# Patient Record
Sex: Male | Born: 1945 | Race: White | Hispanic: No | Marital: Married | Smoking: Former smoker
Health system: Southern US, Academic
[De-identification: ages and names within clinical notes are randomized; demographics above are authoritative.]

## PROBLEM LIST (undated history)

## (undated) DIAGNOSIS — F419 Anxiety disorder, unspecified: Secondary | ICD-10-CM

## (undated) DIAGNOSIS — R7301 Impaired fasting glucose: Secondary | ICD-10-CM

## (undated) DIAGNOSIS — J302 Other seasonal allergic rhinitis: Secondary | ICD-10-CM

## (undated) DIAGNOSIS — R7303 Prediabetes: Secondary | ICD-10-CM

## (undated) DIAGNOSIS — R011 Cardiac murmur, unspecified: Secondary | ICD-10-CM

## (undated) DIAGNOSIS — H269 Unspecified cataract: Secondary | ICD-10-CM

## (undated) DIAGNOSIS — R972 Elevated prostate specific antigen [PSA]: Secondary | ICD-10-CM

## (undated) DIAGNOSIS — K5790 Diverticulosis of intestine, part unspecified, without perforation or abscess without bleeding: Secondary | ICD-10-CM

## (undated) DIAGNOSIS — I10 Essential (primary) hypertension: Secondary | ICD-10-CM

## (undated) DIAGNOSIS — I00 Rheumatic fever without heart involvement: Secondary | ICD-10-CM

## (undated) DIAGNOSIS — I440 Atrioventricular block, first degree: Secondary | ICD-10-CM

## (undated) DIAGNOSIS — IMO0002 Reserved for concepts with insufficient information to code with codable children: Secondary | ICD-10-CM

## (undated) DIAGNOSIS — E785 Hyperlipidemia, unspecified: Secondary | ICD-10-CM

## (undated) DIAGNOSIS — E291 Testicular hypofunction: Secondary | ICD-10-CM

## (undated) DIAGNOSIS — C4491 Basal cell carcinoma of skin, unspecified: Secondary | ICD-10-CM

## (undated) DIAGNOSIS — I4510 Unspecified right bundle-branch block: Secondary | ICD-10-CM

## (undated) DIAGNOSIS — R0600 Dyspnea, unspecified: Secondary | ICD-10-CM

## (undated) DIAGNOSIS — R739 Hyperglycemia, unspecified: Secondary | ICD-10-CM

## (undated) DIAGNOSIS — N400 Enlarged prostate without lower urinary tract symptoms: Secondary | ICD-10-CM

## (undated) DIAGNOSIS — K219 Gastro-esophageal reflux disease without esophagitis: Secondary | ICD-10-CM

## (undated) DIAGNOSIS — M25519 Pain in unspecified shoulder: Secondary | ICD-10-CM

## (undated) DIAGNOSIS — Z8701 Personal history of pneumonia (recurrent): Secondary | ICD-10-CM

## (undated) HISTORY — DX: Benign prostatic hyperplasia without lower urinary tract symptoms: N40.0

## (undated) HISTORY — DX: Reserved for concepts with insufficient information to code with codable children: IMO0002

## (undated) HISTORY — DX: Pain in unspecified shoulder: M25.519

## (undated) HISTORY — DX: Gastro-esophageal reflux disease without esophagitis: K21.9

## (undated) HISTORY — DX: Hyperlipidemia, unspecified: E78.5

## (undated) HISTORY — DX: Dyspnea, unspecified: R06.00

## (undated) HISTORY — DX: Essential (primary) hypertension: I10

## (undated) HISTORY — DX: Personal history of pneumonia (recurrent): Z87.01

## (undated) HISTORY — DX: Anxiety disorder, unspecified: F41.9

## (undated) HISTORY — DX: Testicular hypofunction: E29.1

## (undated) HISTORY — DX: Hyperglycemia, unspecified: R73.9

## (undated) HISTORY — PX: NASAL SEPTUM SURGERY: SHX37

## (undated) HISTORY — DX: Rheumatic fever without heart involvement: I00

## (undated) HISTORY — PX: WISDOM TOOTH EXTRACTION: SHX21

## (undated) HISTORY — DX: Other seasonal allergic rhinitis: J30.2

## (undated) HISTORY — DX: Unspecified right bundle-branch block: I45.10

## (undated) HISTORY — PX: TONSILLECTOMY: SUR1361

## (undated) HISTORY — DX: Diverticulosis of intestine, part unspecified, without perforation or abscess without bleeding: K57.90

## (undated) HISTORY — DX: Cardiac murmur, unspecified: R01.1

## (undated) HISTORY — PX: EYE SURGERY: SHX253

## (undated) HISTORY — DX: Basal cell carcinoma of skin, unspecified: C44.91

## (undated) HISTORY — DX: Atrioventricular block, first degree: I44.0

## (undated) HISTORY — DX: Elevated prostate specific antigen (PSA): R97.20

## (undated) HISTORY — PX: SKIN CANCER EXCISION: SHX779

## (undated) HISTORY — DX: Impaired fasting glucose: R73.01

## (undated) HISTORY — PX: NOSE SURGERY: SHX723

## (undated) HISTORY — DX: Unspecified cataract: H26.9

## (undated) HISTORY — PX: VASECTOMY: SHX75

---

## 2008-04-16 ENCOUNTER — Ambulatory Visit: Payer: Self-pay

## 2009-11-02 HISTORY — PX: HX COLONOSCOPY: 2100001147

## 2010-02-13 ENCOUNTER — Ambulatory Visit (HOSPITAL_COMMUNITY): Payer: Self-pay | Admitting: Plastic and Reconstructive Surgery

## 2010-11-17 ENCOUNTER — Encounter (INDEPENDENT_AMBULATORY_CARE_PROVIDER_SITE_OTHER): Payer: Self-pay

## 2010-11-19 ENCOUNTER — Encounter (INDEPENDENT_AMBULATORY_CARE_PROVIDER_SITE_OTHER): Payer: Medicare Other

## 2011-03-03 ENCOUNTER — Encounter (INDEPENDENT_AMBULATORY_CARE_PROVIDER_SITE_OTHER): Payer: Medicare Other

## 2011-03-25 ENCOUNTER — Encounter (INDEPENDENT_AMBULATORY_CARE_PROVIDER_SITE_OTHER): Payer: Medicare Other

## 2011-04-06 ENCOUNTER — Encounter (INDEPENDENT_AMBULATORY_CARE_PROVIDER_SITE_OTHER): Payer: Medicare Other

## 2011-05-22 ENCOUNTER — Encounter (INDEPENDENT_AMBULATORY_CARE_PROVIDER_SITE_OTHER): Payer: Medicare Other

## 2011-06-08 ENCOUNTER — Encounter (INDEPENDENT_AMBULATORY_CARE_PROVIDER_SITE_OTHER): Payer: Medicare Other

## 2011-09-09 ENCOUNTER — Encounter (INDEPENDENT_AMBULATORY_CARE_PROVIDER_SITE_OTHER): Payer: Self-pay

## 2011-09-15 ENCOUNTER — Encounter (INDEPENDENT_AMBULATORY_CARE_PROVIDER_SITE_OTHER): Payer: Medicare Other

## 2011-09-23 ENCOUNTER — Encounter (INDEPENDENT_AMBULATORY_CARE_PROVIDER_SITE_OTHER): Payer: Self-pay | Admitting: Family

## 2011-09-23 ENCOUNTER — Ambulatory Visit (INDEPENDENT_AMBULATORY_CARE_PROVIDER_SITE_OTHER): Payer: Medicare Other | Admitting: Family

## 2011-09-23 VITALS — BP 145/80 | HR 77 | Resp 20 | Ht 74.0 in | Wt 214.0 lb

## 2011-09-23 MED ORDER — AMOXICILLIN 500 MG TABLET
1.00 | ORAL_TABLET | Freq: Two times a day (BID) | ORAL | Status: AC
Start: 2011-09-23 — End: ?

## 2011-09-23 NOTE — Progress Notes (Signed)
Subjective:      Johnathan Mcconnell is a 65 y.o. male who presents for evaluation of congestion, facial pain, nasal congestion, non productive cough, purulent nasal discharge, sinus pressure and sore throat. Symptoms include coryza, facial pain, nasal congestion, non productive cough, sinus pressure and sore throat with no fever, chills, or night sweats. Onset of symptoms was 7 days ago, gradually worsening since that time.  He is drinking plenty of fluids..  Past history is significant for no history of pneumonia or bronchitis. Patient is non-smoker.    Past Medical History   Diagnosis Date   . Anxiety    . Hypertension    . GERD (gastroesophageal reflux disease)    . Dyspnea    . Hyperlipidemia    . Hypogonadism male    . Hyperglycemia    . BPH (benign prostatic hypertrophy)    . History of pneumonia    . Epicondylitis    . Shoulder pain      No family history on file.  Current Outpatient Prescriptions   Medication Sig Dispense Refill   . Amoxicillin 500 mg Oral Tablet take 1 Cap by mouth Twice daily. RXA #  2043  Expiration Date May 01, 2012    20 Tab  0   . aspirin (ECOTRIN) 81 mg Oral Tablet, Delayed Release (E.C.) take 81 mg by mouth Once a day.         . rosuvastatin (CRESTOR) 5 mg Oral Tablet take 5 mg by mouth QPM.         . UBIDECARENONE (CO Q-10 ORAL) take 1 Tab by mouth Once a day.         . Omega-3 Fatty Acids-Vitamin E (FISH OIL) 1,000 mg Oral Capsule take 2,000 mg by mouth Once a day.         . cholecalciferol, vitamin D3, (VITAMIN D) 1,000 unit Oral Tablet take 1,000 Units by mouth Once a day.         Marland Kitchen DISCONTD: docusate sodium (COLACE) 100 mg Oral Capsule take 100 mg by mouth Once a day.         Marland Kitchen DISCONTD: Lisinopril-Hydrochlorothiazide (ZESTORETIC) 20-12.5 mg Oral Tablet take 1 Tab by mouth Once a day.           Allergies   Allergen Reactions   . Avelox (Moxifloxacin)      History     Social History   . Marital Status: Married     Spouse Name: N/A     Number of Children: N/A    . Years of Education: N/A     Occupational History   . Not on file.     Social History Main Topics   . Smoking status: Former Smoker     Types: Cigarettes     Quit date: 09/22/1968   . Smokeless tobacco: Never Used   . Alcohol Use: No   . Drug Use: No   . Sexually Active: Not on file     Other Topics Concern   . Not on file     Social History Narrative   . No narrative on file       Review of Systems  Other than ROS in the HPI, all other systems were negative.    Objective:     BP 145/80   Pulse 77   Resp 20   Ht 1.88 m (6\' 2" )   Wt 97.07 kg (214 lb)   BMI 27.48 kg/m2  General:  alert, cooperative, no distress,  appears stated age   Head:  frontal sinus tenderness bilateral   Eyes: conjunctivae/corneas clear. PERRL, EOM's intact. Fundi benign   Ears: normal TM's and external ear canals AU   Sinus tender: present   Mouth:  abnormal findings: moderate oropharyngeal erythema   Neck: supple, symmetrical, trachea midline, no adenopathy, thyroid: not enlarged, symmetric, no tenderness/mass/nodules, no carotid bruit and no JVD.   Lungs: clear to auscultation bilaterally        Assessment:     Acute bacterial sinusitis    Plan:     1. Sudafed  2. Amoxicillin  3. Follow up in prn  or return if symptoms worsen or persist.

## 2011-09-29 ENCOUNTER — Other Ambulatory Visit (INDEPENDENT_AMBULATORY_CARE_PROVIDER_SITE_OTHER): Payer: Self-pay | Admitting: Family

## 2011-09-29 ENCOUNTER — Telehealth (INDEPENDENT_AMBULATORY_CARE_PROVIDER_SITE_OTHER): Payer: Self-pay | Admitting: Family

## 2011-09-29 NOTE — Telephone Encounter (Signed)
Patient's wife called and was asking to speak with you about possibly getting a rx for flonase.

## 2011-09-30 MED ORDER — FLUTICASONE PROPIONATE 50 MCG/ACTUATION NASAL SPRAY,SUSPENSION
1.00 | Freq: Two times a day (BID) | NASAL | Status: AC
Start: 2011-09-30 — End: ?

## 2011-09-30 NOTE — Telephone Encounter (Signed)
Use Mrs. Coopers cell number [phone].

## 2011-10-06 ENCOUNTER — Encounter (INDEPENDENT_AMBULATORY_CARE_PROVIDER_SITE_OTHER): Payer: Medicare Other

## 2011-12-23 ENCOUNTER — Encounter (INDEPENDENT_AMBULATORY_CARE_PROVIDER_SITE_OTHER): Payer: Self-pay

## 2011-12-23 ENCOUNTER — Other Ambulatory Visit (INDEPENDENT_AMBULATORY_CARE_PROVIDER_SITE_OTHER): Payer: Self-pay

## 2011-12-23 MED ORDER — ROSUVASTATIN 10 MG TABLET
10.00 mg | ORAL_TABLET | Freq: Every evening | ORAL | Status: DC
Start: 2011-12-23 — End: 2013-06-06

## 2011-12-23 NOTE — Telephone Encounter (Signed)
FBS- 125- needs HGB A1C- suggest weight watchers to 190 lbs.  Also increase crestor to 10/d with repeat labs in 3months.

## 2012-11-11 ENCOUNTER — Telehealth (INDEPENDENT_AMBULATORY_CARE_PROVIDER_SITE_OTHER): Payer: Self-pay

## 2012-11-11 MED ORDER — OSELTAMIVIR 75 MG CAPSULE
75.00 mg | ORAL_CAPSULE | Freq: Two times a day (BID) | ORAL | Status: DC
Start: 2012-11-11 — End: 2013-07-11

## 2012-11-11 NOTE — Telephone Encounter (Signed)
Patient thinks he has the flu. Temp last night, tightness in his chest, no appetite. He would like to speak with you. [phone].

## 2013-06-06 ENCOUNTER — Other Ambulatory Visit (INDEPENDENT_AMBULATORY_CARE_PROVIDER_SITE_OTHER): Payer: Self-pay

## 2013-06-06 MED ORDER — ROSUVASTATIN 10 MG TABLET
10.0000 mg | ORAL_TABLET | Freq: Every evening | ORAL | Status: DC
Start: 2013-06-06 — End: 2013-07-11

## 2013-06-14 ENCOUNTER — Encounter (INDEPENDENT_AMBULATORY_CARE_PROVIDER_SITE_OTHER): Payer: Medicare Other

## 2013-07-11 ENCOUNTER — Encounter (INDEPENDENT_AMBULATORY_CARE_PROVIDER_SITE_OTHER): Payer: Self-pay | Admitting: Internal Medicine

## 2013-07-11 ENCOUNTER — Ambulatory Visit (INDEPENDENT_AMBULATORY_CARE_PROVIDER_SITE_OTHER): Payer: Medicare Other | Admitting: Internal Medicine

## 2013-07-11 VITALS — BP 156/74 | HR 77 | Resp 20 | Ht 74.0 in | Wt 217.9 lb

## 2013-07-11 NOTE — Patient Instructions (Addendum)
Check with [facility] in [place] regarding pneumonia vaccine  Check with Dr. Para March when you had your last colonoscopy  I want you to start an exercise program-45 minutes a day  Think about a low sodium diet <3000 mg a day  MYWVUChart  Email some blood pressure readings over the next two weeks  If consistently higher than 160/90 call

## 2013-07-11 NOTE — Progress Notes (Signed)
[name] MEDICINE AND SPECIALTY OFFICE  [facility]    Johnathan Mcconnell  Date of Service: 07/11/2013    Chief Complaint:   Chief Complaint   Patient presents with   . Physical   . High Cholesterol       History  HPI    This 67 year old male is here for a physical.  He normally follows with Dr. Haze Boyden but has not seen him in awhile.  He tells me is he stressed.  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  He thinks this is the reason his blood pressure is elevated today.  He does have a history of hypertension, but is not currently on medication.  At home it is usually less than 140 systolic.  He does have BPH for which he takes [employer].  He was on Crestor for hyperlipidemia.  His son who is a cardiologist suggested he quit taking this due to side effects, and he has been off of it about a month.  He has also stopped taking his baby aspirin.    Past Medical History   Diagnosis Date   . Anxiety    . Hypertension    . GERD (gastroesophageal reflux disease)    . Dyspnea    . Hyperlipidemia    . Hypogonadism male    . Hyperglycemia    . BPH (benign prostatic hypertrophy)    . History of pneumonia    . Epicondylitis    . Shoulder pain      Past Surgical History   Procedure Laterality Date   . Hx colonoscopy  2011     Current Outpatient Prescriptions   Medication Sig   . Amoxicillin 500 mg Oral Tablet take 1 Cap by mouth Twice daily. RXA #  2043  Expiration Date May 01, 2012     . cholecalciferol, vitamin D3, (VITAMIN D) 1,000 unit Oral Tablet Take 2,000 Units by mouth Once a day    . fluticasone (FLONASE) 50 mcg/actuation Nasal Spray, Suspension 1 Spray by Each Nostril route Twice daily.   . multivitamin Oral Tablet Take 1 Tab by mouth Once a day glucobalanced   . omega 3-dha-epa-other om3-D3 (OMEGA-3 + VITAMIN D3) 2,200 mg-1,000 unit/5 mL Oral Liquid Take 30 mL by mouth Once a day    . rosuvastatin (CRESTOR) 5 mg Oral Tablet Take 5 mg by mouth Every evening   . SAW PALMETTO ORAL Take 3 Tabs by mouth Once a day   . UBIDECARENONE (CO Q-10 ORAL) take 1 Tab by mouth Once a day.       Allergy History as of 08/12/13    MOXIFLOXACIN      Noted Status Severity Type Reaction    09/09/11 1038 Tilda Franco, [place] 96/04/54 Active                 Family History   Problem Relation Age of Onset   . Asthma Father      mesothelioma   . Stroke Mother      dementia   . Anxiety Sister    . Arthritis-osteo Sister      History     Social History   . Marital Status: Married     Spouse Name: N/A     Number of Children: N/A   . Years of Education: N/A     Occupational History   .  Not on file.     Social History Main Topics   . Smoking status: Former Smoker     Types: Cigarettes     Quit date: 09/22/1968   . Smokeless tobacco: Never Used   . Alcohol Use: No   . Drug Use: No   . Sexual Activity: Not on file     Other Topics Concern   . Not on file     Social History Narrative    [place] in [place].  He has been married for 45 years and attributes this to having a "good wife."  He has three children: a son who is a cardiologist, daughter who is a physical therapist and a child who is a Investment banker, operational.  He has 3 donkeys and 4 cows.  He drinks 2 beers per week and has a distant history of tobacco use.         Review of Systems   Constitutional: Negative for fever, chills and weight loss.   HENT: Positive for tinnitus. Negative for congestion, sore throat and neck pain.         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 Neita Carp annual check up next week   Respiratory: Negative for cough, shortness of breath and wheezing.    Cardiovascular: Negative for chest pain, palpitations, leg swelling and PND.   Gastrointestinal: Negative for heartburn, nausea, vomiting, abdominal pain, constipation, blood in stool and melena.   Genitourinary: Negative for hematuria.        Nocturia    Musculoskeletal: Negative for back pain.        Hip pain improved when off gluten   Skin:        Sees dermatologist Dr. 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   Psychiatric/Behavioral: The patient is nervous/anxious.         Some problem with recall as he gets older       Examination  Vitals: BP 156/74   Pulse 77   Resp 20   Ht 1.88 m (6\' 2" )   Wt 98.825 kg (217 lb 13.9 oz)   BMI 27.96 kg/m2   SpO2 94%  Physical Exam   Constitutional: He is oriented to person, place, and time. He appears well-nourished. No distress.   HENT:   Head: Normocephalic and atraumatic.   Right Ear: External ear normal.   Left Ear: External ear normal.   Nose: Nose normal.   Mouth/Throat: Oropharynx is clear and moist. No oropharyngeal exudate.   Eyes: Conjunctivae and EOM are normal. Pupils are equal, round, and reactive to light. No scleral icterus.   Neck: Neck supple. No JVD present. No thyromegaly present.   Cardiovascular: Normal rate, regular rhythm, normal heart sounds and intact distal pulses.  Exam reveals no gallop and no friction rub.    No murmur heard.  Pulm:  Effort normal and breath sounds normal.    Abdomen:   Bowel sounds are normal. He exhibits no distension and no mass. Soft. No tenderness.   No splenomegaly or hepatomegaly   Ortho/Musculoskeletal:   He exhibits no edema.   Lymphadenopathy:     He has no cervical adenopathy.   Neurological: He is alert and oriented to person, place, and time. He has normal reflexes. No cranial nerve deficit. Coordination normal.   Strength is  5/5 throughout, toes are down going, Romberg is negative   Skin: Skin is warm and dry.   Psychiatric: He has a normal mood and affect. Judgment and thought content normal.   Nursing note and vitals reviewed.    .    Results  Patient Instructions    Check with [facility] in [place] regarding pneumonia vaccine  Check with Dr. Para March when you had your last colonoscopy  I want you to start an exercise program-45 minutes a day  Think about a low sodium diet <3000 mg a day  MYWVUChart  Email some blood pressure readings over the next two weeks  If consistently higher than 160/90 call      Diagnosis and Plan  1. Periodic health assessment, general screening, adult -he wants a PSA today; I have advised him of the risks and benefits of this test as it is no longer recommended by the USPTFS.  Health Maintenance   Topic Date Due   . Influenza Vaccine  07/03/2014   . Colonoscopy  07/12/2023   . Pneumovax 65 Years And Older  Addressed   . Zoster Vaccine  Addressed   . Hepatitis C Screening Ab  Completed      2. Hyperglycemia - will check hemoglobin A1c and CMP   3. Hyperlipidemia - check lipid panel and liver function tests    4. Anxiety - will follow   5. Hypertension- advised low sodium diet and exercise; he is to keep a blood pressure log and email these to me to determine if he needs medication    6. GERD (gastroesophageal reflux disease) -resolved   7. BPH (benign prostatic hypertrophy)- on saw palmetto; check PSA   8. Shoulder pain - stable     Orders Placed This Encounter   . Hepatitis C Antibody   . CMP NF   . LIPID PANEL   . Hemoglobin A1C w/ estimated avg glucose (not available at this time with [facility])   . PSA Screening       Chaney Malling, MD

## 2013-07-25 LAB — LIPID PANEL
CHOLESTEROL: 178 mg/dL
HDL-CHOLESTEROL: 60 mg/dL
LDL (CALCULATED): 100 mg/dL
TRIGLYCERIDES: 91 mg/dL
VLDL (CALCULATED): 18 mg/dL

## 2013-07-25 LAB — COMPREHENSIVE METABOLIC PANEL, NON-FASTING
ALBUMIN: 4.1 g/dL (ref 3.4–5.0)
ALKALINE PHOSPHATASE: 73 U/L (ref 45–117)
ALT (SGPT): 43 U/L (ref 16–61)
AST (SGOT): 31 U/L (ref 15–37)
BILIRUBIN, TOTAL: 0.7 mg/dL (ref 0.2–1.0)
BUN: 17 mg/dL (ref 7–18)
CALCIUM: 9.2 mg/dL (ref 8.5–10.1)
CARBON DIOXIDE: 30 mmol/L (ref 21–32)
CHLORIDE: 103 mmoL/L (ref 98–107)
CREATININE: 0.8 mg/dL (ref 0.6–1.3)
ESTIMATED GLOMERULAR FILTRATION RATE: >60 mL/min (ref >60)
GLUCOSE,NONFAST: 118 mg/dL — ABNORMAL HIGH (ref 74–106)
POTASSIUM: 4.2 mmol/L (ref 3.5–5.1)
SODIUM: 138 mmol/L (ref 136–145)
TOTAL PROTEIN: 7.3 gm/dL (ref 6.4–8.2)

## 2013-07-25 LAB — PSA SCREENING: PROSTATE SPECIFIC AG: 2.2 ng/mL (ref 0.00–4.50)

## 2013-07-25 LAB — HGA1C (HEMOGLOBIN A1C WITH EST AVG GLUCOSE): HEMOGLOBIN A1C: 6.2 %

## 2013-07-26 LAB — HEPATITIS C ANTIBODY SCREEN WITH REFLEX TO HCV PCR: HEPATITIS C ANTIBODY: NOT DETECTED NA

## 2013-08-12 ENCOUNTER — Encounter (INDEPENDENT_AMBULATORY_CARE_PROVIDER_SITE_OTHER): Payer: Self-pay | Admitting: Internal Medicine

## 2013-11-02 DIAGNOSIS — I10 Essential (primary) hypertension: Secondary | ICD-10-CM

## 2013-11-02 HISTORY — DX: Essential (primary) hypertension: I10

## 2013-11-07 DIAGNOSIS — R7309 Other abnormal glucose: Secondary | ICD-10-CM | POA: Diagnosis not present

## 2013-11-07 DIAGNOSIS — R030 Elevated blood-pressure reading, without diagnosis of hypertension: Secondary | ICD-10-CM | POA: Diagnosis not present

## 2013-11-07 DIAGNOSIS — E785 Hyperlipidemia, unspecified: Secondary | ICD-10-CM | POA: Diagnosis not present

## 2013-11-08 ENCOUNTER — Encounter (INDEPENDENT_AMBULATORY_CARE_PROVIDER_SITE_OTHER): Payer: Medicare Other | Admitting: Internal Medicine

## 2014-01-02 DIAGNOSIS — K5900 Constipation, unspecified: Secondary | ICD-10-CM | POA: Diagnosis not present

## 2014-01-02 DIAGNOSIS — R030 Elevated blood-pressure reading, without diagnosis of hypertension: Secondary | ICD-10-CM | POA: Diagnosis not present

## 2014-01-08 DIAGNOSIS — L820 Inflamed seborrheic keratosis: Secondary | ICD-10-CM | POA: Diagnosis not present

## 2014-01-08 DIAGNOSIS — D0430 Carcinoma in situ of skin of unspecified part of face: Secondary | ICD-10-CM | POA: Diagnosis not present

## 2014-01-08 DIAGNOSIS — L570 Actinic keratosis: Secondary | ICD-10-CM | POA: Diagnosis not present

## 2014-01-08 DIAGNOSIS — L821 Other seborrheic keratosis: Secondary | ICD-10-CM | POA: Diagnosis not present

## 2014-01-08 DIAGNOSIS — L819 Disorder of pigmentation, unspecified: Secondary | ICD-10-CM | POA: Diagnosis not present

## 2014-01-08 DIAGNOSIS — D0439 Carcinoma in situ of skin of other parts of face: Secondary | ICD-10-CM | POA: Diagnosis not present

## 2014-01-08 DIAGNOSIS — D485 Neoplasm of uncertain behavior of skin: Secondary | ICD-10-CM | POA: Diagnosis not present

## 2014-01-24 DIAGNOSIS — H6590 Unspecified nonsuppurative otitis media, unspecified ear: Secondary | ICD-10-CM | POA: Diagnosis not present

## 2014-01-24 DIAGNOSIS — J209 Acute bronchitis, unspecified: Secondary | ICD-10-CM | POA: Diagnosis not present

## 2014-01-24 DIAGNOSIS — J069 Acute upper respiratory infection, unspecified: Secondary | ICD-10-CM | POA: Diagnosis not present

## 2014-01-26 DIAGNOSIS — L570 Actinic keratosis: Secondary | ICD-10-CM | POA: Diagnosis not present

## 2014-01-26 DIAGNOSIS — C44320 Squamous cell carcinoma of skin of unspecified parts of face: Secondary | ICD-10-CM | POA: Diagnosis not present

## 2014-02-06 DIAGNOSIS — H903 Sensorineural hearing loss, bilateral: Secondary | ICD-10-CM | POA: Diagnosis not present

## 2014-02-06 DIAGNOSIS — H905 Unspecified sensorineural hearing loss: Secondary | ICD-10-CM | POA: Diagnosis not present

## 2014-02-06 DIAGNOSIS — H6980 Other specified disorders of Eustachian tube, unspecified ear: Secondary | ICD-10-CM | POA: Diagnosis not present

## 2014-02-06 DIAGNOSIS — J309 Allergic rhinitis, unspecified: Secondary | ICD-10-CM | POA: Diagnosis not present

## 2014-02-06 DIAGNOSIS — H6590 Unspecified nonsuppurative otitis media, unspecified ear: Secondary | ICD-10-CM | POA: Diagnosis not present

## 2014-03-21 DIAGNOSIS — H6980 Other specified disorders of Eustachian tube, unspecified ear: Secondary | ICD-10-CM | POA: Diagnosis not present

## 2014-03-21 DIAGNOSIS — J309 Allergic rhinitis, unspecified: Secondary | ICD-10-CM | POA: Diagnosis not present

## 2014-03-21 DIAGNOSIS — H905 Unspecified sensorineural hearing loss: Secondary | ICD-10-CM | POA: Diagnosis not present

## 2014-07-11 DIAGNOSIS — D485 Neoplasm of uncertain behavior of skin: Secondary | ICD-10-CM | POA: Diagnosis not present

## 2014-07-11 DIAGNOSIS — L738 Other specified follicular disorders: Secondary | ICD-10-CM | POA: Diagnosis not present

## 2014-07-11 DIAGNOSIS — L678 Other hair color and hair shaft abnormalities: Secondary | ICD-10-CM | POA: Diagnosis not present

## 2014-07-11 DIAGNOSIS — L820 Inflamed seborrheic keratosis: Secondary | ICD-10-CM | POA: Diagnosis not present

## 2014-07-11 DIAGNOSIS — L570 Actinic keratosis: Secondary | ICD-10-CM | POA: Diagnosis not present

## 2014-07-11 DIAGNOSIS — C44320 Squamous cell carcinoma of skin of unspecified parts of face: Secondary | ICD-10-CM | POA: Diagnosis not present

## 2014-07-16 DIAGNOSIS — H2510 Age-related nuclear cataract, unspecified eye: Secondary | ICD-10-CM | POA: Diagnosis not present

## 2014-07-16 DIAGNOSIS — H524 Presbyopia: Secondary | ICD-10-CM | POA: Diagnosis not present

## 2014-07-16 DIAGNOSIS — H5210 Myopia, unspecified eye: Secondary | ICD-10-CM | POA: Diagnosis not present

## 2014-07-16 DIAGNOSIS — H52229 Regular astigmatism, unspecified eye: Secondary | ICD-10-CM | POA: Diagnosis not present

## 2014-07-31 DIAGNOSIS — C44320 Squamous cell carcinoma of skin of unspecified parts of face: Secondary | ICD-10-CM | POA: Diagnosis not present

## 2014-08-06 DIAGNOSIS — L570 Actinic keratosis: Secondary | ICD-10-CM | POA: Diagnosis not present

## 2014-08-28 DIAGNOSIS — M1711 Unilateral primary osteoarthritis, right knee: Secondary | ICD-10-CM | POA: Diagnosis not present

## 2014-08-28 DIAGNOSIS — M1712 Unilateral primary osteoarthritis, left knee: Secondary | ICD-10-CM | POA: Diagnosis not present

## 2014-09-15 DIAGNOSIS — Z23 Encounter for immunization: Secondary | ICD-10-CM | POA: Diagnosis not present

## 2014-10-08 DIAGNOSIS — I10 Essential (primary) hypertension: Secondary | ICD-10-CM | POA: Diagnosis not present

## 2014-10-24 DIAGNOSIS — I10 Essential (primary) hypertension: Secondary | ICD-10-CM | POA: Diagnosis not present

## 2014-11-21 DIAGNOSIS — M545 Low back pain: Secondary | ICD-10-CM | POA: Diagnosis not present

## 2014-11-21 DIAGNOSIS — I10 Essential (primary) hypertension: Secondary | ICD-10-CM | POA: Diagnosis not present

## 2014-11-26 DIAGNOSIS — M5417 Radiculopathy, lumbosacral region: Secondary | ICD-10-CM | POA: Diagnosis not present

## 2014-11-26 DIAGNOSIS — M5441 Lumbago with sciatica, right side: Secondary | ICD-10-CM | POA: Diagnosis not present

## 2014-12-03 DIAGNOSIS — M5441 Lumbago with sciatica, right side: Secondary | ICD-10-CM | POA: Diagnosis not present

## 2014-12-03 DIAGNOSIS — M5417 Radiculopathy, lumbosacral region: Secondary | ICD-10-CM | POA: Diagnosis not present

## 2014-12-10 DIAGNOSIS — M5441 Lumbago with sciatica, right side: Secondary | ICD-10-CM | POA: Diagnosis not present

## 2014-12-10 DIAGNOSIS — M5417 Radiculopathy, lumbosacral region: Secondary | ICD-10-CM | POA: Diagnosis not present

## 2014-12-19 DIAGNOSIS — M5441 Lumbago with sciatica, right side: Secondary | ICD-10-CM | POA: Diagnosis not present

## 2014-12-19 DIAGNOSIS — M5417 Radiculopathy, lumbosacral region: Secondary | ICD-10-CM | POA: Diagnosis not present

## 2014-12-27 DIAGNOSIS — M5417 Radiculopathy, lumbosacral region: Secondary | ICD-10-CM | POA: Diagnosis not present

## 2014-12-27 DIAGNOSIS — M5441 Lumbago with sciatica, right side: Secondary | ICD-10-CM | POA: Diagnosis not present

## 2015-01-16 DIAGNOSIS — M5441 Lumbago with sciatica, right side: Secondary | ICD-10-CM | POA: Diagnosis not present

## 2015-01-16 DIAGNOSIS — M5417 Radiculopathy, lumbosacral region: Secondary | ICD-10-CM | POA: Diagnosis not present

## 2015-01-23 DIAGNOSIS — D485 Neoplasm of uncertain behavior of skin: Secondary | ICD-10-CM | POA: Diagnosis not present

## 2015-01-23 DIAGNOSIS — L738 Other specified follicular disorders: Secondary | ICD-10-CM | POA: Diagnosis not present

## 2015-01-23 DIAGNOSIS — L570 Actinic keratosis: Secondary | ICD-10-CM | POA: Diagnosis not present

## 2015-01-23 DIAGNOSIS — L820 Inflamed seborrheic keratosis: Secondary | ICD-10-CM | POA: Diagnosis not present

## 2015-02-25 DIAGNOSIS — L570 Actinic keratosis: Secondary | ICD-10-CM | POA: Diagnosis not present

## 2015-07-18 DIAGNOSIS — H2513 Age-related nuclear cataract, bilateral: Secondary | ICD-10-CM | POA: Diagnosis not present

## 2015-07-26 DIAGNOSIS — L570 Actinic keratosis: Secondary | ICD-10-CM | POA: Diagnosis not present

## 2015-07-26 DIAGNOSIS — Z85828 Personal history of other malignant neoplasm of skin: Secondary | ICD-10-CM | POA: Diagnosis not present

## 2015-07-26 DIAGNOSIS — D485 Neoplasm of uncertain behavior of skin: Secondary | ICD-10-CM | POA: Diagnosis not present

## 2015-07-26 DIAGNOSIS — L814 Other melanin hyperpigmentation: Secondary | ICD-10-CM | POA: Diagnosis not present

## 2015-07-26 DIAGNOSIS — L821 Other seborrheic keratosis: Secondary | ICD-10-CM | POA: Diagnosis not present

## 2015-07-26 DIAGNOSIS — L820 Inflamed seborrheic keratosis: Secondary | ICD-10-CM | POA: Diagnosis not present

## 2015-08-20 DIAGNOSIS — Z840 Family history of diseases of the skin and subcutaneous tissue: Secondary | ICD-10-CM | POA: Diagnosis not present

## 2015-08-20 DIAGNOSIS — Z85828 Personal history of other malignant neoplasm of skin: Secondary | ICD-10-CM | POA: Diagnosis not present

## 2015-08-20 DIAGNOSIS — R21 Rash and other nonspecific skin eruption: Secondary | ICD-10-CM | POA: Diagnosis not present

## 2015-08-30 DIAGNOSIS — R21 Rash and other nonspecific skin eruption: Secondary | ICD-10-CM | POA: Diagnosis not present

## 2015-09-24 DIAGNOSIS — Z23 Encounter for immunization: Secondary | ICD-10-CM | POA: Diagnosis not present

## 2015-10-01 DIAGNOSIS — Z85828 Personal history of other malignant neoplasm of skin: Secondary | ICD-10-CM | POA: Diagnosis not present

## 2015-10-01 DIAGNOSIS — R21 Rash and other nonspecific skin eruption: Secondary | ICD-10-CM | POA: Diagnosis not present

## 2015-10-01 DIAGNOSIS — L570 Actinic keratosis: Secondary | ICD-10-CM | POA: Diagnosis not present

## 2015-10-01 DIAGNOSIS — Z840 Family history of diseases of the skin and subcutaneous tissue: Secondary | ICD-10-CM | POA: Diagnosis not present

## 2015-11-22 DIAGNOSIS — D485 Neoplasm of uncertain behavior of skin: Secondary | ICD-10-CM | POA: Diagnosis not present

## 2015-11-22 DIAGNOSIS — D235 Other benign neoplasm of skin of trunk: Secondary | ICD-10-CM | POA: Diagnosis not present

## 2015-12-18 DIAGNOSIS — M79605 Pain in left leg: Secondary | ICD-10-CM | POA: Diagnosis not present

## 2015-12-18 DIAGNOSIS — R351 Nocturia: Secondary | ICD-10-CM | POA: Diagnosis not present

## 2015-12-18 DIAGNOSIS — Z6828 Body mass index (BMI) 28.0-28.9, adult: Secondary | ICD-10-CM | POA: Diagnosis not present

## 2015-12-18 DIAGNOSIS — M13869 Other specified arthritis, unspecified knee: Secondary | ICD-10-CM | POA: Diagnosis not present

## 2015-12-18 DIAGNOSIS — I10 Essential (primary) hypertension: Secondary | ICD-10-CM | POA: Diagnosis not present

## 2015-12-18 DIAGNOSIS — Z1389 Encounter for screening for other disorder: Secondary | ICD-10-CM | POA: Diagnosis not present

## 2015-12-18 DIAGNOSIS — Z0000 Encounter for general adult medical examination without abnormal findings: Secondary | ICD-10-CM | POA: Diagnosis not present

## 2015-12-18 DIAGNOSIS — E784 Other hyperlipidemia: Secondary | ICD-10-CM | POA: Diagnosis not present

## 2015-12-18 DIAGNOSIS — R6882 Decreased libido: Secondary | ICD-10-CM | POA: Diagnosis not present

## 2015-12-18 DIAGNOSIS — N401 Enlarged prostate with lower urinary tract symptoms: Secondary | ICD-10-CM | POA: Diagnosis not present

## 2015-12-23 ENCOUNTER — Encounter (INDEPENDENT_AMBULATORY_CARE_PROVIDER_SITE_OTHER): Payer: Self-pay | Admitting: Internal Medicine

## 2016-01-01 DIAGNOSIS — I10 Essential (primary) hypertension: Secondary | ICD-10-CM | POA: Diagnosis not present

## 2016-01-01 DIAGNOSIS — E784 Other hyperlipidemia: Secondary | ICD-10-CM | POA: Diagnosis not present

## 2016-01-01 DIAGNOSIS — E785 Hyperlipidemia, unspecified: Secondary | ICD-10-CM | POA: Diagnosis not present

## 2016-01-01 DIAGNOSIS — R7301 Impaired fasting glucose: Secondary | ICD-10-CM

## 2016-01-01 DIAGNOSIS — R6882 Decreased libido: Secondary | ICD-10-CM | POA: Diagnosis not present

## 2016-01-01 DIAGNOSIS — Z125 Encounter for screening for malignant neoplasm of prostate: Secondary | ICD-10-CM | POA: Diagnosis not present

## 2016-01-01 HISTORY — DX: Impaired fasting glucose: R73.01

## 2016-01-03 DIAGNOSIS — E785 Hyperlipidemia, unspecified: Secondary | ICD-10-CM | POA: Diagnosis not present

## 2016-01-07 NOTE — Progress Notes (Signed)
Records faxed to: [phone].

## 2016-01-29 DIAGNOSIS — I10 Essential (primary) hypertension: Secondary | ICD-10-CM | POA: Diagnosis not present

## 2016-01-29 DIAGNOSIS — Z6828 Body mass index (BMI) 28.0-28.9, adult: Secondary | ICD-10-CM | POA: Diagnosis not present

## 2016-01-29 DIAGNOSIS — R7301 Impaired fasting glucose: Secondary | ICD-10-CM | POA: Diagnosis not present

## 2016-01-29 DIAGNOSIS — M13869 Other specified arthritis, unspecified knee: Secondary | ICD-10-CM | POA: Diagnosis not present

## 2016-04-23 DIAGNOSIS — R002 Palpitations: Secondary | ICD-10-CM | POA: Diagnosis not present

## 2016-04-23 DIAGNOSIS — I10 Essential (primary) hypertension: Secondary | ICD-10-CM | POA: Diagnosis not present

## 2016-04-23 DIAGNOSIS — E784 Other hyperlipidemia: Secondary | ICD-10-CM | POA: Diagnosis not present

## 2016-04-23 DIAGNOSIS — F419 Anxiety disorder, unspecified: Secondary | ICD-10-CM | POA: Diagnosis not present

## 2016-04-23 DIAGNOSIS — I499 Cardiac arrhythmia, unspecified: Secondary | ICD-10-CM | POA: Diagnosis not present

## 2016-05-08 DIAGNOSIS — D485 Neoplasm of uncertain behavior of skin: Secondary | ICD-10-CM | POA: Diagnosis not present

## 2016-05-08 DIAGNOSIS — L570 Actinic keratosis: Secondary | ICD-10-CM | POA: Diagnosis not present

## 2016-05-08 DIAGNOSIS — D225 Melanocytic nevi of trunk: Secondary | ICD-10-CM | POA: Diagnosis not present

## 2016-05-08 DIAGNOSIS — L720 Epidermal cyst: Secondary | ICD-10-CM | POA: Diagnosis not present

## 2016-05-08 DIAGNOSIS — L820 Inflamed seborrheic keratosis: Secondary | ICD-10-CM | POA: Diagnosis not present

## 2016-05-08 DIAGNOSIS — D2371 Other benign neoplasm of skin of right lower limb, including hip: Secondary | ICD-10-CM | POA: Diagnosis not present

## 2016-05-08 DIAGNOSIS — L814 Other melanin hyperpigmentation: Secondary | ICD-10-CM | POA: Diagnosis not present

## 2016-06-02 NOTE — Progress Notes (Signed)
Cardiology Office Note    Date:  06/08/2016   ID:  Cory Barnett, DOB [birth year], MRN [id number]  PCP:  Jerlyn Ly, MD  Cardiologist:  Kirk Ruths, MD    History of Present Illness:  Cory Barnett is a 70 y.o. male for evaluation of palpitations. No prior cardiac history. Patient states he had a stress nuclear study approximately 5 years ago. Records not available but apparently normal. He exercises routinely. He has dyspnea with more extreme activities but not routine activities. He denies significant orthopnea, PND, pedal edema, exertional chest pain or syncope. 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 He has noticed occasional palpitations at night when he is relaxing. It is described as a skip. Not sustained. He has mild chest tightness with this but does not have chest tightness with exertion. He occasionally has some dizziness with changing positions.    Past Medical History:  Diagnosis Date  . Hyperlipidemia   . Hypertension   . Rheumatic fever     Past Surgical History:  Procedure Laterality Date  . NOSE SURGERY    . TONSILLECTOMY      Current Medications: No outpatient prescriptions prior to visit.   No facility-administered medications prior to visit.      Allergies:   Moxifloxacin   Social History   Social History  . Marital status: Married    Spouse name: N/A  . Number of children: 3  . Years of education: N/A   Occupational History  .      Retired   Social History Main Topics  . Smoking status: Former Research scientist (life sciences)  . Smokeless tobacco: None  . Alcohol use 1.2 oz/week    2 Cans of beer per week  . Drug use: Unknown  . Sexual activity: Not Asked   Other Topics Concern  . None   Social History Narrative  . None     Family History:  The patient's family history includes Stroke in his mother.   ROS:   Please see the history of present illness.  Knee arthralgias  but no weight loss, productive cough, hemoptysis, dysphagia, odynophagia, melena, hematochezia, dysuria, hematuria, rash, seizure activity, orthopnea, PND, pedal edema, claudication. All remaining systems negative.   PHYSICAL EXAM:   VS:  BP (!) 138/92 (BP Location: Left Arm, Patient Position: Sitting, Cuff Size: Large)   Pulse 83   Ht 6\' 1"  (1.854 m)   Wt 221 lb 12.8 oz (100.6 kg)   BMI 29.26 kg/m    GEN: Well nourished, well developed, in no acute distress  HEENT: normal  Neck: no JVD, carotid bruits, or masses Cardiac: RRR; no rubs, or gallops,no edema, 2/6 systolic murmur LSB Respiratory:  clear to auscultation bilaterally, normal work of breathing GI: soft, nontender, nondistended, + BS, + bruit MS: no deformity or atrophy  Skin: warm and dry, no rash Neuro:  Alert and Oriented x 3, Strength and sensation are intact Psych: euthymic mood, full affect  Wt Readings from Last 3 Encounters:  06/08/16 221 lb 12.8 oz (100.6 kg)      Studies/Labs Reviewed:   EKG:  EKG -Sinus rhythm, first-degree AV block, occasional PVC, right bundle branch block.   Additional studies/ records that were reviewed today include:      A/P  1 Palpitations-patient's description sounds to likely be PACs or PVCs. There is a PVC noted on his electrocardiogram. His symptoms are improving as some of his  stress has been removed. He has also decreased his caffeine use. Check an echocardiogram, TSH, potassium and magnesium. We can consider an event monitor in the future if his symptoms worsen. 2 chest tightness-he has mild chest tightness with his palpitations. However he exercises routinely with no chest pain. He apparently had a stress test 4-5 years ago. We will obtain the results. I will not pursue further ischemia evaluation at this time. 3 bruit-schedule abdominal ultrasound to exclude aneurysm. 4 hypertension-he follows his blood pressure at home and it is typically controlled. However he does have  some orthostatic symptoms. I will discontinue HCTZ and we will treat with Cozaar 50 mg daily alone. Follow blood pressure and advance regimen as needed. 5 hyperlipidemia-continue statin. Lipids and liver are monitored by primary care.     Medication Adjustments/Labs and Tests Ordered: Current medicines are reviewed at length with the patient today.  Concerns regarding medicines are outlined above.  Medication changes, Labs and Tests ordered today are listed in the Patient Instructions below. Patient Instructions  Medication Instructions:  Your physician has recommended you make the following change in your medication:  1) STOP Losartan-HCTZ 2) START Losartan  (Cozaar) 50 mg tablet by mouth ONCE daily  Labwork: Your physician recommends that you return for lab work in: TODAY (bmet, tsh, mg) The lab can be found on the FIRST FLOOR of out building in [address]   Testing/Procedures: Your physician has requested that you have an abdominal aorta duplex. During this test, an ultrasound is used to evaluate the aorta. Allow 30 minutes for this exam. Do not eat after midnight the day before and avoid carbonated beverages  Your physician has requested that you have an echocardiogram. Echocardiography is a painless test that uses sound waves to create images of your heart. It provides your doctor with information about the size and shape of your heart and how well your heart's chambers and valves are working. This procedure takes approximately one hour. There are no restrictions for this procedure.    Follow-Up: Your physician wants you to follow-up in: 6 months with Dr. Stanford Breed. You will receive a reminder letter in the mail two months in advance. If you don't receive a letter, please call our office to schedule the follow-up appointment.   Any Other Special Instructions Will Be Listed Below (If Applicable).     If you need a refill on your cardiac medications before your next appointment,  please call your pharmacy.      Signed, Kirk Ruths, MD  06/08/2016 10:37 AM    [facility]

## 2016-06-08 ENCOUNTER — Ambulatory Visit (INDEPENDENT_AMBULATORY_CARE_PROVIDER_SITE_OTHER): Payer: Medicare Other | Admitting: Cardiology

## 2016-06-08 ENCOUNTER — Encounter: Payer: Self-pay | Admitting: Cardiology

## 2016-06-08 VITALS — BP 138/92 | HR 83 | Ht 73.0 in | Wt 221.8 lb

## 2016-06-08 DIAGNOSIS — R002 Palpitations: Secondary | ICD-10-CM | POA: Diagnosis not present

## 2016-06-08 DIAGNOSIS — R0989 Other specified symptoms and signs involving the circulatory and respiratory systems: Secondary | ICD-10-CM | POA: Diagnosis not present

## 2016-06-08 DIAGNOSIS — R0602 Shortness of breath: Secondary | ICD-10-CM

## 2016-06-08 DIAGNOSIS — E785 Hyperlipidemia, unspecified: Secondary | ICD-10-CM

## 2016-06-08 DIAGNOSIS — I10 Essential (primary) hypertension: Secondary | ICD-10-CM

## 2016-06-08 LAB — BASIC METABOLIC PANEL
BUN: 15 mg/dL (ref 7–25)
CALCIUM: 10 mg/dL (ref 8.6–10.3)
CO2: 31 mmol/L (ref 20–31)
Chloride: 101 mmol/L (ref 98–110)
Creat: 0.93 mg/dL (ref 0.70–1.18)
Glucose, Bld: 123 mg/dL — ABNORMAL HIGH (ref 65–99)
Potassium: 4.7 mmol/L (ref 3.5–5.3)
SODIUM: 142 mmol/L (ref 135–146)

## 2016-06-08 LAB — MAGNESIUM: MAGNESIUM: 2.2 mg/dL (ref 1.5–2.5)

## 2016-06-08 LAB — TSH: TSH: 1.51 m[IU]/L (ref 0.40–4.50)

## 2016-06-08 MED ORDER — LOSARTAN POTASSIUM 50 MG PO TABS: 50.0000 mg | ORAL_TABLET | Freq: Every day | ORAL | 3 refills | Status: DC

## 2016-06-08 NOTE — Addendum Note (Signed)
Addended by: Newt Minion on: 06/08/2016 11:10 AM   Modules accepted: Orders

## 2016-06-08 NOTE — Patient Instructions (Addendum)
Medication Instructions:  Your physician has recommended you make the following change in your medication:  1) STOP Losartan-HCTZ 2) START Losartan  (Cozaar) 50 mg tablet by mouth ONCE daily  Labwork: Your physician recommends that you return for lab work in: TODAY (bmet, tsh, mg) The lab can be found on the FIRST FLOOR of out building in [address]   Testing/Procedures: Your physician has requested that you have an abdominal aorta duplex. During this test, an ultrasound is used to evaluate the aorta. Allow 30 minutes for this exam. Do not eat after midnight the day before and avoid carbonated beverages  Your physician has requested that you have an echocardiogram. Echocardiography is a painless test that uses sound waves to create images of your heart. It provides your doctor with information about the size and shape of your heart and how well your heart's chambers and valves are working. This procedure takes approximately one hour. There are no restrictions for this procedure.    Follow-Up: Your physician wants you to follow-up in: 6 months with Dr. Stanford Breed. You will receive a reminder letter in the mail two months in advance. If you don't receive a letter, please call our office to schedule the follow-up appointment.   Any Other Special Instructions Will Be Listed Below (If Applicable).     If you need a refill on your cardiac medications before your next appointment, please call your pharmacy.

## 2016-06-16 ENCOUNTER — Ambulatory Visit (HOSPITAL_COMMUNITY)
Admission: RE | Admit: 2016-06-16 | Discharge: 2016-06-16 | Disposition: A | Discharge status: Home / Self Care | Payer: Medicare Other | Source: Ambulatory Visit | Attending: Urology | Admitting: Urology

## 2016-06-16 DIAGNOSIS — R0989 Other specified symptoms and signs involving the circulatory and respiratory systems: Secondary | ICD-10-CM

## 2016-06-16 DIAGNOSIS — E785 Hyperlipidemia, unspecified: Secondary | ICD-10-CM | POA: Insufficient documentation

## 2016-06-16 DIAGNOSIS — I10 Essential (primary) hypertension: Secondary | ICD-10-CM | POA: Insufficient documentation

## 2016-06-18 ENCOUNTER — Other Ambulatory Visit: Payer: Medicare Other | Admitting: *Deleted

## 2016-06-18 ENCOUNTER — Other Ambulatory Visit: Payer: Self-pay

## 2016-06-18 ENCOUNTER — Ambulatory Visit (HOSPITAL_COMMUNITY): Payer: Medicare Other | Attending: Cardiovascular Disease

## 2016-06-18 DIAGNOSIS — I119 Hypertensive heart disease without heart failure: Secondary | ICD-10-CM | POA: Diagnosis not present

## 2016-06-18 DIAGNOSIS — R002 Palpitations: Secondary | ICD-10-CM | POA: Diagnosis not present

## 2016-06-18 DIAGNOSIS — I351 Nonrheumatic aortic (valve) insufficiency: Secondary | ICD-10-CM | POA: Insufficient documentation

## 2016-06-18 DIAGNOSIS — R0602 Shortness of breath: Secondary | ICD-10-CM | POA: Insufficient documentation

## 2016-06-18 DIAGNOSIS — E785 Hyperlipidemia, unspecified: Secondary | ICD-10-CM | POA: Diagnosis not present

## 2016-06-18 LAB — ECHOCARDIOGRAM COMPLETE
E decel time: 342 msec
EERAT: 9.87
FS: 43 % (ref 28–44)
IVS/LV PW RATIO, ED: 1.29
LA diam end sys: 44 mm
LA diam index: 1.96 cm/m2
LA vol A4C: 56 ml
LA vol index: 27.6 mL/m2
LASIZE: 44 mm
LAVOL: 62 mL
LDCA: 3.8 cm2
LV E/e'average: 9.87
LV PW d: 12.6 mm — AB (ref 0.6–1.1)
LV TDI E'LATERAL: 5.35
LV TDI E'MEDIAL: 4.47
LV e' LATERAL: 5.35 cm/s
LVEEMED: 9.87
LVOT VTI: 21.4 cm
LVOT peak grad rest: 6 mmHg
LVOTD: 22 mm
LVOTPV: 120 cm/s
LVOTSV: 81 mL
MV Dec: 342
MV pk A vel: 70.2 m/s
MV pk E vel: 52.8 m/s
RV LATERAL S' VELOCITY: 15.8 cm/s

## 2016-06-22 ENCOUNTER — Encounter: Payer: Self-pay | Admitting: *Deleted

## 2016-07-03 DIAGNOSIS — M1711 Unilateral primary osteoarthritis, right knee: Secondary | ICD-10-CM | POA: Diagnosis not present

## 2016-07-03 DIAGNOSIS — M1710 Unilateral primary osteoarthritis, unspecified knee: Secondary | ICD-10-CM | POA: Diagnosis not present

## 2016-07-03 DIAGNOSIS — M7631 Iliotibial band syndrome, right leg: Secondary | ICD-10-CM | POA: Diagnosis not present

## 2016-07-07 ENCOUNTER — Ambulatory Visit: Payer: Medicare Other | Attending: Physician Assistant | Admitting: Physical Therapy

## 2016-07-07 DIAGNOSIS — R262 Difficulty in walking, not elsewhere classified: Secondary | ICD-10-CM | POA: Insufficient documentation

## 2016-07-07 DIAGNOSIS — M6281 Muscle weakness (generalized): Secondary | ICD-10-CM | POA: Insufficient documentation

## 2016-07-07 DIAGNOSIS — M25561 Pain in right knee: Secondary | ICD-10-CM | POA: Diagnosis not present

## 2016-07-07 DIAGNOSIS — M25661 Stiffness of right knee, not elsewhere classified: Secondary | ICD-10-CM

## 2016-07-07 NOTE — Patient Instructions (Signed)
  HAMSTRING STRETCH WITH TOWEL  While lying down on your back, hook a towel or strap under  your foot and draw up your leg until a stretch is felt under your leg. calf area.  Keep your knee in a straightened position during the stretch.  Hold 20 seconds, perform 3 repetitions.  3 times a day.      [name] PT [facility] [address], [place], [place] [address] Phone # [phone] Fax [phone]

## 2016-07-07 NOTE — Therapy (Addendum)
[facility] [address], [place], [place], [address] Phone: [phone]   Fax:  [phone]  Physical Therapy Evaluation  Patient Details  Name: Cory Barnett MRN: [id number] Date of Birth: 07-04-1946 Referring Provider: Dr. Margit Banda  Encounter Date: 07/07/2016      PT End of Session - 07/07/16 2030    Visit Number 1   Number of Visits 10   Date for PT Re-Evaluation 09/01/16   Authorization Type Medicare G codes;  KX at visit 15   PT Start Time 0930   PT Stop Time 1015   PT Time Calculation (min) 45 min   Activity Tolerance Patient tolerated treatment well      Past Medical History:  Diagnosis Date  . Hyperlipidemia   . Hypertension   . Rheumatic fever     Past Surgical History:  Procedure Laterality Date  . NOSE SURGERY    . TONSILLECTOMY      There were no vitals filed for this visit.       Subjective Assessment - 07/07/16 0926    Subjective Right knee pain primarily since last November when moved from [facility] carried boxes up stairs;  lateral knee pain with treadmill walking or jogging;  [facility] [place];  now using bike and Elliptical on low resistance;  regular walking not too pain 1 mile;  some swelling.  pain with full knee flexion;  pain with ascending stairs including carrying objects; pain awakens   Limitations Walking   Diagnostic tests x-rays showed a fair amount of cartilage;  arthritic knee caps   Patient Stated Goals get rid of this pain and be more active   Currently in Pain? Yes   Pain Score 3   with ex 7/10   Pain Location Knee   Pain Orientation Right   Pain Type Chronic pain   Pain Onset More than a month ago   Pain Frequency Constant   Aggravating Factors  ascending stairs, prolonged walking;  jogging   Pain Relieving Factors ice; Advil            [facility] PT Assessment - 07/07/16 0001      Assessment   Medical Diagnosis iliotibial band syndrome right; patellofemoral arthritis    Referring Provider Dr. Margit Banda   Onset Date/Surgical Date --  november   Hand Dominance Right   Next MD Visit 3 weeks   Prior Therapy 2 years ago in low back     Precautions   Precautions None     Restrictions   Weight Bearing Restrictions No     Balance Screen   Has the patient fallen in the past 6 months No   Has the patient had a decrease in activity level because of a fear of falling?  Yes  right knee give-way 2x   Is the patient reluctant to leave their home because of a fear of falling?  No     Home Environment   Living Environment Private residence   Living Arrangements Spouse/significant other   Type of [place] to enter   Home Layout Two level;Able to live on main level with bedroom/bathroom     Prior Function   Level of Independence Independent   Vocation Part time employment   Leisure play with grandkids, fish and hunt, travel     Observation/Other Assessments   Focus on Therapeutic Outcomes (FOTO)  53% limitation     Posture/Postural Control   Posture Comments  peripatellar swelling;  enlarged tibial tubercle     ROM / Strength   AROM / PROM / Strength AROM;Strength     AROM   AROM Assessment Site Knee   Right/Left Knee Right;Left   Right Knee Extension 0   Right Knee Flexion 125   Left Knee Extension 0   Left Knee Flexion 130     Strength   Strength Assessment Site Hip;Knee   Right/Left Hip Right;Left   Right Hip ABduction 4+/5   Left Hip ABduction 4+/5   Right/Left Knee Right;Left   Right Knee Flexion 4+/5   Right Knee Extension 4/5   Left Knee Flexion 5/5   Left Knee Extension 4+/5     Flexibility   Soft Tissue Assessment /Muscle Length yes   Hamstrings 50 degrees bilaterally     Special Tests    Special Tests Knee Special Tests   Knee Special tests  Lateral Pull Sign;Step-up/Step Down Test;Patellofemoral Grind Test (Clarke's Sign)     Lateral Pull Sign    Findings Positive   Side Right     Step-up/Step Down     Findings Positive   Side  Right   Comments compensatory trunk lean and hip adduction     Patellofemoral Grind test (Clark's Sign)   Findings Postive   Side  Right                           PT Education - 07/07/16 2030    Education provided Yes   Education Details HS and ITB stretches;  clams    Person(s) Educated Patient   Methods Explanation;Demonstration;Handout   Comprehension Verbalized understanding;Returned demonstration          PT Short Term Goals - 07/07/16 2046      PT SHORT TERM GOAL #1   Title The patient will demonstrate compliance with initial HEP for ROM and low level strengthening   08/04/16   Time 4   Period Weeks   Status New     PT SHORT TERM GOAL #2   Title The patient will report a 30% improvement in night pain and pain with usual ADLs   Time 4   Period Weeks   Status New     PT SHORT TERM GOAL #3   Title Right knee flexion improved to 130 degrees needed for greater ease with ascending stairs   Time 4   Period Weeks   Status New     PT SHORT TERM GOAL #4   Title HS length to 60 degrees needed for improved stride length when ambulating   Time 4   Period Weeks   Status New           PT Long Term Goals - 07/07/16 2049      PT LONG TERM GOAL #1   Title The patient will be independent in safe self progression of HEP and appropriate gym ex's for further improvements in strength  09/01/16   Time 8   Period Weeks   Status New     PT LONG TERM GOAL #2   Title The patient will have improved right quad and gluteus medius strength to 5-/5 needed for ascending steps at home with less pain and decreased incidence of knee giveway   Time 8   Period Weeks   Status New     PT LONG TERM GOAL #3   Title The patient will have improved pain at night and usual ADLs by  at least 60%   Time 8   Period Weeks   Status New     PT LONG TERM GOAL #4   Title FOTO functional outcome score improved from 53% limitation to 38% indicating  improved function with less pain   Time 8   Period Weeks   Status New               Plan - 07/07/16 2031    Clinical Impression Statement The patient presents with complaints of right lateral knee pain which began last November after carrying boxes up the stairs when he moved here from [facility].  He reports the pain wakes him at night and limits his ability to walk on the treadmill or jog.   Pain with ascending stairs.    He has had 2 episodes of knee give-way.  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  Mild tenderness right lateral joint line.  Mild swelling peripatellar region.  Slightly reduced calf size on right.  Palpable and audible right > left crepitus.  Decreased knee flexion ROM with pain at endrange.  With step down test, he has compensatory trunk rotation and hip adduction.  Decreased knee extension and hip abduction strength.  Decreased HS muscle lengths bilaterally.  The patient would benefit from PT to address these deficits.  The patient is of low evaluation complexity secondary to minimal co-morbidities and supportive home environment.     Rehab Potential Good   PT Frequency 2x / week   PT Duration 8 weeks   PT Treatment/Interventions ADLs/Self Care Home Management;Cryotherapy;Electrical Stimulation;Iontophoresis 4mg /ml Dexamethasone;Ultrasound;Moist Heat;Therapeutic exercise;Neuromuscular re-education;Patient/family education;Manual techniques;Dry needling;Taping   PT Next Visit Plan Review HEP of HS and ITB stretch and clams;  DN ITB/ lateral quads and HS;  soft tissue mob;  ionto patch (on initial order);  quad activation ex's; core stab; glute medius strengthening      Patient will benefit from skilled therapeutic intervention in order to improve the following deficits and impairments:  Decreased range of motion, Decreased strength, Pain, Difficulty walking, Impaired flexibility, Increased edema  Visit  Diagnosis: Pain in right knee - Plan: PT plan of care cert/re-cert  Muscle weakness (generalized) - Plan: PT plan of care cert/re-cert  Stiffness of right knee, not elsewhere classified - Plan: PT plan of care cert/re-cert  Difficulty in walking, not elsewhere classified - Plan: PT plan of care cert/re-cert  G codes: Foto and clinical judgement   Walking and Moving around Current CK Goal CJ   Problem List There are no active problems to display for this patient.  [name], PT 07/07/16 8:56 PM Phone: [phone] Fax: [phone]  [name] 07/07/2016, 8:56 PM  [facility] [address] [place], [place], [address] Phone: [phone]   Fax:  [phone]  Name: Cory Barnett MRN: [id number] Date of Birth: 01-Jan-1946

## 2016-07-09 ENCOUNTER — Ambulatory Visit: Payer: Medicare Other

## 2016-07-09 ENCOUNTER — Ambulatory Visit: Payer: Medicare Other | Admitting: Physical Therapy

## 2016-07-09 DIAGNOSIS — M6281 Muscle weakness (generalized): Secondary | ICD-10-CM | POA: Diagnosis not present

## 2016-07-09 DIAGNOSIS — M25561 Pain in right knee: Secondary | ICD-10-CM | POA: Diagnosis not present

## 2016-07-09 DIAGNOSIS — M25661 Stiffness of right knee, not elsewhere classified: Secondary | ICD-10-CM

## 2016-07-09 DIAGNOSIS — R262 Difficulty in walking, not elsewhere classified: Secondary | ICD-10-CM

## 2016-07-09 NOTE — Therapy (Signed)
[facility] [address] [place], [place], [address] Phone: [phone]   Fax:  [phone]  Physical Therapy Treatment  Patient Details  Name: Cory Barnett MRN: [id number] Date of Birth: [birth year] Referring Provider: Dr. Margit Banda  Encounter Date: 07/09/2016      PT End of Session - 07/09/16 1120    Visit Number 2   Number of Visits 10   Date for PT Re-Evaluation 09/01/16   Authorization Type Medicare G codes;  KX at visit 15   PT Start Time 0923   PT Stop Time 1013   PT Time Calculation (min) 50 min   Activity Tolerance Patient tolerated treatment well      Past Medical History:  Diagnosis Date  . Hyperlipidemia   . Hypertension   . Rheumatic fever     Past Surgical History:  Procedure Laterality Date  . NOSE SURGERY    . TONSILLECTOMY      There were no vitals filed for this visit.      Subjective Assessment - 07/09/16 0925    Subjective Patient reports no problems after initial evaluation.  My knee is already starting to feel better.     Currently in Pain? Yes   Pain Score 3    Pain Location Knee   Pain Orientation Right   Pain Type Chronic pain                         [facility] Adult PT Treatment/Exercise - 07/09/16 0001      Knee/Hip Exercises: Stretches   Other Knee/Hip Stretches psoas doorway stretch right/left 3x5 each with UE movements   Other Knee/Hip Stretches verbal review of HS and ITB stretches     Knee/Hip Exercises: Aerobic   Nustep 5 min     Knee/Hip Exercises: Standing   Forward Step Up Right;Left;1 set;10 reps   SLS with Vectors 3 ways with slight knee bend 10x   Other Standing Knee Exercises verbal review of clams as instructed last visit     Modalities   Modalities Moist Heat     Moist Heat Therapy   Number Minutes Moist Heat 10 Minutes   Moist Heat Location Knee     Manual Therapy   Manual Therapy Joint mobilization;Soft tissue mobilization   Joint Mobilization grade 3 patellar medial glides 15x   Soft tissue mobilization Instrument assisted (Graston) G4 to lateral quads and HS           Trigger Point Dry Needling - 07/09/16 1119    Consent Given? Yes   Education Handout Provided Yes   Muscles Treated Lower Body Tensor fascia lata;Quadriceps;Hamstring   Tensor Fascia Lata Response Palpable increased muscle length   Quadriceps Response Twitch response elicited;Palpable increased muscle length   Hamstring Response Twitch response elicited;Palpable increased muscle length              PT Education - 07/09/16 1119    Education provided Yes   Education Details psoas doorway stretches; standing 3 ways; lateral step ups   Person(s) Educated Patient   Methods Explanation;Demonstration;Handout   Comprehension Verbalized understanding;Returned demonstration          PT Short Term Goals - 07/09/16 1125      PT SHORT TERM GOAL #1   Title The patient will demonstrate compliance with initial HEP for ROM and low level strengthening   08/04/16   Time 4   Period Weeks   Status On-going  PT SHORT TERM GOAL #2   Title The patient will report a 30% improvement in night pain and pain with usual ADLs   Time 4   Period Weeks   Status On-going     PT SHORT TERM GOAL #3   Title Right knee flexion improved to 130 degrees needed for greater ease with ascending stairs   Time 4   Period Weeks   Status On-going     PT SHORT TERM GOAL #4   Title HS length to 60 degrees needed for improved stride length when ambulating   Time 4   Period Weeks   Status On-going           PT Long Term Goals - 07/09/16 1125      PT LONG TERM GOAL #1   Title The patient will be independent in safe self progression of HEP and appropriate gym ex's for further improvements in strength  09/01/16   Time 8   Period Weeks   Status On-going     PT LONG TERM GOAL #2   Title The patient will have improved right quad and gluteus medius  strength to 5-/5 needed for ascending steps at home with less pain and decreased incidence of knee giveway   Time 8   Period Weeks   Status On-going     PT LONG TERM GOAL #3   Title The patient will have improved pain at night and usual ADLs by at least 60%   Time 8   Period Weeks   Status On-going     PT LONG TERM GOAL #4   Title FOTO functional outcome score improved from 53% limitation to 38% indicating improved function with less pain   Time 8   Period Weeks   Status On-going               Plan - 07/09/16 1120    Clinical Impression Statement The patient reports retropatellar pain (mild) on Nu-Step and with single leg standing but not the usual lateral right knee pain.  Intensity is mild.  Verbal cues for patellar alignment, good in small range of movement but with increased knee flexion adduction/valgus occurs.  Improved patellar medial glide following dry needling and manual techniques.     PT Next Visit Plan assess response to DN#1; start ionto patch;  continue quad and gluteus medius strengthening;  ITB/QL/Lat stretching      Patient will benefit from skilled therapeutic intervention in order to improve the following deficits and impairments:     Visit Diagnosis: Pain in right knee  Muscle weakness (generalized)  Stiffness of right knee, not elsewhere classified  Difficulty in walking, not elsewhere classified     Problem List There are no active problems to display for this patient.  [name], PT 07/09/16 11:27 AM Phone: [phone] Fax: [phone]  [name] 07/09/2016, 11:26 AM  [facility] [address] [place], [place], [address] Phone: [phone]   Fax:  [phone]  Name: Cory Barnett MRN: [id number] Date of Birth: [birth year]

## 2016-07-09 NOTE — Patient Instructions (Signed)
     Trigger Point Dry Needling  . What is Trigger Point Dry Needling (DN)? o DN is a physical therapy technique used to treat muscle pain and dysfunction. Specifically, DN helps deactivate muscle trigger points (muscle knots).  o A thin filiform needle is used to penetrate the skin and stimulate the underlying trigger point. The goal is for a local twitch response (LTR) to occur and for the trigger point to relax. No medication of any kind is injected during the procedure.   . What Does Trigger Point Dry Needling Feel Like?  o The procedure feels different for each individual patient. Some patients report that they do not actually feel the needle enter the skin and overall the process is not painful. Very mild bleeding may occur. However, many patients feel a deep cramping in the muscle in which the needle was inserted. This is the local twitch response.   . How Will I feel after the treatment? o Soreness is normal, and the onset of soreness may not occur for a few hours. Typically this soreness does not last longer than two days.  o Bruising is uncommon, however; ice can be used to decrease any possible bruising.  o In rare cases feeling tired or nauseous after the treatment is normal. In addition, your symptoms may get worse before they get better, this period will typically not last longer than 24 hours.   . What Can I do After My Treatment? o Increase your hydration by drinking more water for the next 24 hours. o You may place ice or heat on the areas treated that have become sore, however, do not use heat on inflamed or bruised areas. Heat often brings more relief post needling. o You can continue your regular activities, but vigorous activity is not recommended initially after the treatment for 24 hours. o DN is best combined with other physical therapy such as strengthening, stretching, and other therapies.    Jerusalem Brownstein PT [facility] [address], [address] [place], [place] [address] Phone # [phone] Fax [phone] 

## 2016-07-10 ENCOUNTER — Encounter: Payer: Medicare Other | Admitting: Physical Therapy

## 2016-07-14 ENCOUNTER — Ambulatory Visit: Payer: Medicare Other | Admitting: Physical Therapy

## 2016-07-14 DIAGNOSIS — M25561 Pain in right knee: Secondary | ICD-10-CM | POA: Diagnosis not present

## 2016-07-14 DIAGNOSIS — M25661 Stiffness of right knee, not elsewhere classified: Secondary | ICD-10-CM

## 2016-07-14 DIAGNOSIS — M6281 Muscle weakness (generalized): Secondary | ICD-10-CM | POA: Diagnosis not present

## 2016-07-14 DIAGNOSIS — R262 Difficulty in walking, not elsewhere classified: Secondary | ICD-10-CM

## 2016-07-14 NOTE — Therapy (Signed)
[facility] [address] [place], [place], [address] Phone: [phone]   Fax:  [phone]  Physical Therapy Treatment  Patient Details  Name: Cory Barnett MRN: [id number] Date of Birth: 04-Jan-1946 Referring Provider: Dr. Margit Banda  Encounter Date: 07/14/2016      PT End of Session - 07/14/16 2023    Visit Number 3   Number of Visits 10   Date for PT Re-Evaluation 09/01/16   Authorization Type Medicare G codes;  KX at visit 15   PT Start Time 0930   PT Stop Time 1016   PT Time Calculation (min) 46 min   Activity Tolerance Patient tolerated treatment well      Past Medical History:  Diagnosis Date  . Hyperlipidemia   . Hypertension   . Rheumatic fever     Past Surgical History:  Procedure Laterality Date  . NOSE SURGERY    . TONSILLECTOMY      There were no vitals filed for this visit.      Subjective Assessment - 07/14/16 0932    Subjective My knee was doing really well.  Fri night went to the [place].  Sunday I walked a long way at the [place] and my knee bothered my with walking.  One night I had a [place] in my calf.  Reports minimal soreness following last session.   Lateral stepping on 8-9 inch stepping was too painful so discontinued.   Took Advil 1x.  Going away for a week and a half.  I did feel better from Thurs through Saturday.    Currently in Pain? Yes   Pain Score 2    Pain Location Knee   Pain Orientation Right   Pain Type Chronic pain                         [facility] Adult PT Treatment/Exercise - 07/14/16 0001      Knee/Hip Exercises: Stretches   Other Knee/Hip Stretches psoas doorway stretch right/left 3x5 each with UE movements     Knee/Hip Exercises: Aerobic   Nustep 6 min     Knee/Hip Exercises: Standing   Hip ADduction Strengthening;Right;Left;1 set;10 reps   Hip ADduction Limitations red band   Lateral Step Up Right;Left;1 set;10 reps;Hand Hold: 0     Moist Heat Therapy   Number Minutes Moist Heat 5 Minutes  applied during HEP instruction   Moist Heat Location Knee     Iontophoresis   Type of Iontophoresis Dexamethasone   Location lateral knee joint line   Dose 4 mg/ml  #1   Time 4-6 hour patch     Manual Therapy   Soft tissue mobilization TFL, lateral quads and HS          Trigger Point Dry Needling - 07/14/16 2022    Consent Given? Yes   Tensor Fascia Lata Response Palpable increased muscle length   Quadriceps Response Twitch response elicited;Palpable increased muscle length   Hamstring Response Twitch response elicited;Palpable increased muscle length              PT Education - 07/14/16 2022    Education provided Yes   Education Details hip abd with red band;  review of previous HEP with modifications;  ionto instruction sheet   Person(s) Educated Patient   Methods Explanation;Demonstration;Handout   Comprehension Verbalized understanding;Returned demonstration          PT Short Term Goals - 07/14/16 2218  PT SHORT TERM GOAL #1   Title The patient will demonstrate compliance with initial HEP for ROM and low level strengthening   08/04/16   Time 4   Period Weeks   Status On-going     PT SHORT TERM GOAL #2   Title The patient will report a 30% improvement in night pain and pain with usual ADLs   Time 4   Period Weeks   Status On-going     PT SHORT TERM GOAL #3   Title Right knee flexion improved to 130 degrees needed for greater ease with ascending stairs   Time 4   Period Weeks   Status On-going     PT SHORT TERM GOAL #4   Title HS length to 60 degrees needed for improved stride length when ambulating   Time 4   Period Weeks   Status On-going           PT Long Term Goals - 07/14/16 2218      PT LONG TERM GOAL #1   Title The patient will be independent in safe self progression of HEP and appropriate gym ex's for further improvements in strength  09/01/16   Time 8   Period  Weeks   Status On-going     PT LONG TERM GOAL #2   Title The patient will have improved right quad and gluteus medius strength to 5-/5 needed for ascending steps at home with less pain and decreased incidence of knee giveway   Time 8   Period Weeks   Status On-going     PT LONG TERM GOAL #3   Title The patient will have improved pain at night and usual ADLs by at least 60%   Time 8   Period Weeks   Status On-going     PT LONG TERM GOAL #4   Title FOTO functional outcome score improved from 53% limitation to 38% indicating improved function with less pain   Time 8   Period Weeks   Status On-going               Plan - 07/14/16 2214    Clinical Impression Statement The patient is able to perform 4 inch lateral step ups without pain (he tried 9 inch steps at home which was too painful.)  Min cues for patellofemoral alignment needed.  Improved soft tissue length noted after treatment session.     PT Next Visit Plan assess response to DN#2; assess response to ionto patch #1 and continue trial;  continue quad and gluteus medius strengthening;  ITB/QL/Lat stretching;  Graston;  patellar glides      Patient will benefit from skilled therapeutic intervention in order to improve the following deficits and impairments:     Visit Diagnosis: Pain in right knee  Muscle weakness (generalized)  Stiffness of right knee, not elsewhere classified  Difficulty in walking, not elsewhere classified     Problem List There are no active problems to display for this patient. [name], PT 07/14/16 10:22 PM Phone: [phone] Fax: [phone]  [name] 07/14/2016, 10:21 PM  [facility] [address], [place], [place], [address] Phone: [phone]   Fax:  [phone]  Name: Cory Barnett MRN: [id number] Date of Birth: 1946-04-23

## 2016-07-14 NOTE — Patient Instructions (Addendum)

## 2016-07-16 ENCOUNTER — Ambulatory Visit: Payer: Medicare Other | Admitting: Physical Therapy

## 2016-07-16 DIAGNOSIS — M25561 Pain in right knee: Secondary | ICD-10-CM | POA: Diagnosis not present

## 2016-07-16 DIAGNOSIS — M6281 Muscle weakness (generalized): Secondary | ICD-10-CM | POA: Diagnosis not present

## 2016-07-16 DIAGNOSIS — M25661 Stiffness of right knee, not elsewhere classified: Secondary | ICD-10-CM | POA: Diagnosis not present

## 2016-07-16 DIAGNOSIS — R262 Difficulty in walking, not elsewhere classified: Secondary | ICD-10-CM | POA: Diagnosis not present

## 2016-07-16 NOTE — Therapy (Addendum)
[facility] [address], [place], [place], [address] Phone: [phone]   Fax:  [phone]  Physical Therapy Treatment  Patient Details  Name: Cory Barnett MRN: [id number] Date of Birth: January 26, 1946 Referring Provider: Dr. Margit Banda  Encounter Date: 07/16/2016      PT End of Session - 07/16/16 1111    Visit Number 4   Number of Visits 10   Date for PT Re-Evaluation 09/01/16   Authorization Type Medicare G codes;  KX at visit 15   PT Start Time 1015   PT Stop Time 1100   PT Time Calculation (min) 45 min   Activity Tolerance Patient tolerated treatment well      Past Medical History:  Diagnosis Date  . Hyperlipidemia   . Hypertension   . Rheumatic fever     Past Surgical History:  Procedure Laterality Date  . NOSE SURGERY    . TONSILLECTOMY      There were no vitals filed for this visit.      Subjective Assessment - 07/16/16 1016    Subjective My knee today is more sensitive today behind the kneecap.  I tried to do the step ups on a 4 inch block and it hurt my knee so I stopped doing them.  No more calf cramps.  Woke up with a creaky knee.     Currently in Pain? Yes   Pain Score 4    Pain Location Knee   Pain Orientation Right   Pain Type Chronic pain   Pain Frequency Intermittent                         [facility] Adult PT Treatment/Exercise - 07/16/16 0001      Knee/Hip Exercises: Stretches   Other Knee/Hip Stretches psoas doorway stretch right/left 3x5 each with UE movements     Knee/Hip Exercises: Aerobic   Nustep 6 min     Knee/Hip Exercises: Standing   Other Standing Knee Exercises glut medius hip hike 10x right and left     Knee/Hip Exercises: Seated   Long Arc Quad Strengthening;Right;Left;1 set;15 reps;Other (comment);  Seated quad sets 10x with ball squeeze 10x   Long Arc Quad Limitations red band Fig 8 around feet   Knee/Hip Flexion red band hip flexion ankle DF 10x  right /left  mild right knee discomfort     Iontophoresis   Type of Iontophoresis Dexamethasone   Location lateral knee joint line   Dose 4 mg/ml #2   Time 4-6 hour patch     Manual Therapy   Joint Mobilization grade 3 patellar medial glides 15x   Soft tissue mobilization Instrument assisted (Graston) G4 to lateral quads and HS                   PT Short Term Goals - 07/16/16 1124      PT SHORT TERM GOAL #1   Title The patient will demonstrate compliance with initial HEP for ROM and low level strengthening   08/04/16   Status Achieved     PT SHORT TERM GOAL #2   Title The patient will report a 30% improvement in night pain and pain with usual ADLs   Time 4   Period Weeks   Status On-going     PT SHORT TERM GOAL #3   Title Right knee flexion improved to 130 degrees needed for greater ease with ascending stairs   Time 4  Period Weeks   Status On-going     PT SHORT TERM GOAL #4   Title HS length to 60 degrees needed for improved stride length when ambulating   Time 4   Period Weeks   Status On-going           PT Long Term Goals - 07/16/16 1125      PT LONG TERM GOAL #1   Title The patient will be independent in safe self progression of HEP and appropriate gym ex's for further improvements in strength  09/01/16   Time 8   Period Weeks   Status On-going     PT LONG TERM GOAL #2   Title The patient will have improved right quad and gluteus medius strength to 5-/5 needed for ascending steps at home with less pain and decreased incidence of knee giveway   Time 8   Period Weeks   Status On-going     PT LONG TERM GOAL #3   Title The patient will have improved pain at night and usual ADLs by at least 60%   Time 8   Period Weeks   Status On-going     PT LONG TERM GOAL #4   Title FOTO functional outcome score improved from 53% limitation to 38% indicating improved function with less pain   Time 8   Period Weeks   Status On-going                Plan - 07/16/16 1112    Clinical Impression Statement The patient complains of retropatellar pain and crepitus with lateral step ups will therefore hold this particular ex.  He is able to perform seated quad sets and resisted small range arc strengthening without pain exacerbation.  Good pain relief with instrument assisted soft tissue work.  Therapist closely monitoring pain response and modifying as needed.  Patient does report some dizziness in standing which he states is typical 1 hour after taking his BP pill.    PT Next Visit Plan DN ITB;  continue ionto patch #3;  possible lateral glide correction McConnell taping;  try SLS with band UE diagonals;  Graston ;  recheck knee AROM and HS length      Patient will benefit from skilled therapeutic intervention in order to improve the following deficits and impairments:     Visit Diagnosis: Pain in right knee  Muscle weakness (generalized)  Stiffness of right knee, not elsewhere classified  Difficulty in walking, not elsewhere classified     Problem List There are no active problems to display for this patient. [name], PT 07/16/16 11:28 AM Phone: [phone] Fax: [phone]  [name] 07/16/2016, 11:26 AM  [facility] [address] [place], [place], [address] Phone: [phone]   Fax:  [phone]  Name: Cory Barnett MRN: [id number] Date of Birth: 09-07-1946

## 2016-07-31 ENCOUNTER — Ambulatory Visit: Payer: Medicare Other | Admitting: Physical Therapy

## 2016-07-31 DIAGNOSIS — M6281 Muscle weakness (generalized): Secondary | ICD-10-CM | POA: Diagnosis not present

## 2016-07-31 DIAGNOSIS — R262 Difficulty in walking, not elsewhere classified: Secondary | ICD-10-CM | POA: Diagnosis not present

## 2016-07-31 DIAGNOSIS — M25661 Stiffness of right knee, not elsewhere classified: Secondary | ICD-10-CM

## 2016-07-31 DIAGNOSIS — M25561 Pain in right knee: Secondary | ICD-10-CM | POA: Diagnosis not present

## 2016-07-31 NOTE — Therapy (Signed)
[facility] [address] [place], [place], [address] Phone: [phone]   Fax:  [phone]  Physical Therapy Treatment  Patient Details  Name: Cory Barnett MRN: [id number] Date of Birth: 1946/05/04 Referring Provider: Dr. Margit Banda  Encounter Date: 07/31/2016      PT End of Session - 07/31/16 1146    Visit Number 5   Number of Visits 10   Date for PT Re-Evaluation 09/01/16   Authorization Type Medicare G codes;  KX at visit 15   PT Start Time 1015   PT Stop Time 1110   PT Time Calculation (min) 55 min   Activity Tolerance Patient tolerated treatment well      Past Medical History:  Diagnosis Date  . Hyperlipidemia   . Hypertension   . Rheumatic fever     Past Surgical History:  Procedure Laterality Date  . NOSE SURGERY    . TONSILLECTOMY      There were no vitals filed for this visit.      Subjective Assessment - 07/31/16 1017    Subjective My knee did fairly well driving but walking the beach it really hurt.  Walked 2-3 miles, got back that evening, walked the boardwalk and I was really in pain.  I did my exercises religiously and they helped.     Currently in Pain? Yes   Pain Score 2    Pain Location Knee   Pain Orientation Right   Pain Type Chronic pain   Pain Frequency Intermittent                         [facility] Adult PT Treatment/Exercise - 07/31/16 0001      Knee/Hip Exercises: Aerobic   Nustep 7 min      Knee/Hip Exercises: Standing   Hip ADduction Strengthening;Right;Left;1 set;10 reps   Hip ADduction Limitations red band   Other Standing Knee Exercises glut medius hip hike 10x right and left     Iontophoresis   Type of Iontophoresis Dexamethasone   Location lateral knee joint line   Dose 4 mg/ml #3   Time 4-6 hour patch     Manual Therapy   Joint Mobilization grade 3 patellar medial glides 15x   Soft tissue mobilization TFL, lateral quads and HS           Trigger Point Dry Needling - 07/31/16 1033    Consent Given? Yes   Tensor Fascia Lata Response Twitch response elicited;Palpable increased muscle length   Quadriceps Response Twitch response elicited;Palpable increased muscle length   Hamstring Response Twitch response elicited;Palpable increased muscle length                PT Short Term Goals - 07/31/16 1151      PT SHORT TERM GOAL #1   Title The patient will demonstrate compliance with initial HEP for ROM and low level strengthening   08/04/16   Status Achieved     PT SHORT TERM GOAL #2   Title The patient will report a 30% improvement in night pain and pain with usual ADLs   Time 4   Period Weeks   Status On-going     PT SHORT TERM GOAL #3   Title Right knee flexion improved to 130 degrees needed for greater ease with ascending stairs   Time 4   Period Weeks   Status On-going     PT SHORT TERM GOAL #4   Title HS  length to 60 degrees needed for improved stride length when ambulating   Time 4   Period Weeks   Status On-going           PT Long Term Goals - 07/31/16 1151      PT LONG TERM GOAL #1   Title The patient will be independent in safe self progression of HEP and appropriate gym ex's for further improvements in strength  09/01/16   Time 8   Period Weeks   Status On-going     PT LONG TERM GOAL #2   Title The patient will have improved right quad and gluteus medius strength to 5-/5 needed for ascending steps at home with less pain and decreased incidence of knee giveway   Time 8   Period Weeks   Status On-going     PT LONG TERM GOAL #3   Title The patient will have improved pain at night and usual ADLs by at least 60%   Time 8   Period Weeks   Status On-going     PT LONG TERM GOAL #4   Title FOTO functional outcome score improved from 53% limitation to 38% indicating improved function with less pain   Time 8   Period Weeks   Status On-going               Plan - 07/31/16 1146     Clinical Impression Statement The patient reports an exacerbation of symptoms with walking on the beach sand in [place].  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  Bilateral pes planus present.  Discussed orthotic options.  Lateral quads and tensor fascia lata very reactive today with dry needling and soft tissue work.  Improved muscle length following.  Therapist closely monitoring response with all interventions.     PT Next Visit Plan DN ITB;  continue ionto patch #4;  possible lateral glide correction McConnell taping;  open chain strengthening;   Graston ;  recheck knee AROM and HS length      Patient will benefit from skilled therapeutic intervention in order to improve the following deficits and impairments:     Visit Diagnosis: Pain in right knee  Muscle weakness (generalized)  Stiffness of right knee, not elsewhere classified  Difficulty in walking, not elsewhere classified     Problem List There are no active problems to display for this patient. [name], PT 07/31/16 11:54 AM Phone: [phone] Fax: [phone]  [name] 07/31/2016, 11:53 AM  [facility] [address], [place], [place], [address] Phone: [phone]   Fax:  [phone]  Name: Cory Barnett MRN: [id number] Date of Birth: 03-31-1946

## 2016-08-04 ENCOUNTER — Ambulatory Visit: Payer: Medicare Other | Attending: Physician Assistant | Admitting: Physical Therapy

## 2016-08-04 DIAGNOSIS — M25561 Pain in right knee: Secondary | ICD-10-CM | POA: Insufficient documentation

## 2016-08-04 DIAGNOSIS — R262 Difficulty in walking, not elsewhere classified: Secondary | ICD-10-CM | POA: Diagnosis not present

## 2016-08-04 DIAGNOSIS — M6281 Muscle weakness (generalized): Secondary | ICD-10-CM | POA: Insufficient documentation

## 2016-08-04 DIAGNOSIS — M25661 Stiffness of right knee, not elsewhere classified: Secondary | ICD-10-CM | POA: Diagnosis not present

## 2016-08-04 DIAGNOSIS — G8929 Other chronic pain: Secondary | ICD-10-CM | POA: Diagnosis not present

## 2016-08-04 NOTE — Therapy (Signed)
[facility] [address] [place], [place], [address] Phone: [phone]   Fax:  [phone]  Physical Therapy Treatment  Patient Details  Name: Cory Barnett MRN: [id number] Date of Birth: October 25, 1946 Referring Provider: Dr. Margit Banda  Encounter Date: 08/04/2016      PT End of Session - 08/04/16 2028    Visit Number 6   Number of Visits 10   Date for PT Re-Evaluation 09/01/16   Authorization Type Medicare G codes;  KX at visit 15   PT Start Time 1015   PT Stop Time 1100   PT Time Calculation (min) 45 min   Activity Tolerance Patient tolerated treatment well      Past Medical History:  Diagnosis Date  . Hyperlipidemia   . Hypertension   . Rheumatic fever     Past Surgical History:  Procedure Laterality Date  . NOSE SURGERY    . TONSILLECTOMY      There were no vitals filed for this visit.      Subjective Assessment - 08/04/16 1019    Subjective My back hurts from crawling under a house yesterday.  Going to the gym 2x/week, did knee extension machine.  Lateral knee pain and inferior knee pain.  I think the knee is getting better but it does relapse every once in a while.     Currently in Pain? Yes   Pain Score 3    Pain Location Knee   Pain Orientation Right            [facility] PT Assessment - 08/04/16 0001      AROM   Right Knee Flexion 133     Flexibility   Hamstrings 70 degrees bilaterally                     [facility] Adult PT Treatment/Exercise - 08/04/16 0001      Knee/Hip Exercises: Stretches   Active Hamstring Stretch Right;Left;3 reps;30 seconds   Active Hamstring Stretch Limitations with strap supine     Knee/Hip Exercises: Machines for Strengthening   Cybex Knee Flexion 35# bilateral, right and left single leg 25# 15x each     Knee/Hip Exercises: Seated   Long Arc Quad Strengthening;Right;2 sets;10 reps;Weights   Long Arc Quad Weight 7 lbs.     Iontophoresis   Type  of Iontophoresis Dexamethasone   Location lateral knee joint line   Dose 4 mg/ml #4   Time 4-6 hour patch     Manual Therapy   Joint Mobilization grade 3 patellar medial glides 15x   Soft tissue mobilization TFL, lateral quads and HS          Trigger Point Dry Needling - 08/04/16 2027    Consent Given? Yes   Tensor Fascia Lata Response Twitch response elicited;Palpable increased muscle length   Quadriceps Response Twitch response elicited;Palpable increased muscle length   Hamstring Response Twitch response elicited;Palpable increased muscle length                PT Short Term Goals - 08/04/16 2036      PT SHORT TERM GOAL #1   Title The patient will demonstrate compliance with initial HEP for ROM and low level strengthening   08/04/16   Status Achieved     PT SHORT TERM GOAL #2   Title The patient will report a 30% improvement in night pain and pain with usual ADLs   Time 4   Period Weeks  Status Achieved     PT SHORT TERM GOAL #3   Title Right knee flexion improved to 130 degrees needed for greater ease with ascending stairs   Status Achieved     PT SHORT TERM GOAL #4   Title HS length to 60 degrees needed for improved stride length when ambulating   Status Achieved           PT Long Term Goals - 08/04/16 2037      PT LONG TERM GOAL #1   Title The patient will be independent in safe self progression of HEP and appropriate gym ex's for further improvements in strength  09/01/16   Time 8   Period Weeks   Status On-going     PT LONG TERM GOAL #2   Title The patient will have improved right quad and gluteus medius strength to 5-/5 needed for ascending steps at home with less pain and decreased incidence of knee giveway   Time 8   Period Weeks   Status On-going     PT LONG TERM GOAL #3   Title The patient will have improved pain at night and usual ADLs by at least 60%   Time 8   Period Weeks   Status On-going     PT LONG TERM GOAL #4   Title FOTO  functional outcome score improved from 53% limitation to 38% indicating improved function with less pain   Time 8   Period Weeks   Status On-going               Plan - 08/04/16 2028    Clinical Impression Statement The patient has made good improvements in HS muscle length from 50 degrees to 70 degrees bilaterally.  Right knee flexion has improved from 125 degrees to 133 degrees.  He has moderate patellofemoral crepitus with closed chain and terminal knee extension but is able to do small arc knee extension from 90-50 without discomfort.  Tender points persist along TFL and lateral quads.  Discussed footwear and shoe replacement secondary to worn shoe surface particularly laterally.  Therapist closely monitoring response with all interventions.     PT Next Visit Plan DN ITB #5;  continue ionto patch #5;  possible lateral glide correction McConnell taping;  open chain strengthening      Patient will benefit from skilled therapeutic intervention in order to improve the following deficits and impairments:     Visit Diagnosis: Chronic pain of right knee  Muscle weakness (generalized)  Stiffness of right knee, not elsewhere classified  Difficulty in walking, not elsewhere classified     Problem List There are no active problems to display for this patient.  [name], PT 08/04/16 8:40 PM Phone: [phone] Fax: [phone]  [name] 08/04/2016, 8:39 PM  [facility] [address] [place], [place], [address] Phone: [phone]   Fax:  [phone]  Name: Cory Barnett MRN: [id number] Date of Birth: [birth year]

## 2016-08-06 ENCOUNTER — Ambulatory Visit: Payer: Medicare Other | Admitting: Physical Therapy

## 2016-08-06 DIAGNOSIS — R262 Difficulty in walking, not elsewhere classified: Secondary | ICD-10-CM

## 2016-08-06 DIAGNOSIS — M25661 Stiffness of right knee, not elsewhere classified: Secondary | ICD-10-CM

## 2016-08-06 DIAGNOSIS — M25561 Pain in right knee: Principal | ICD-10-CM

## 2016-08-06 DIAGNOSIS — G8929 Other chronic pain: Secondary | ICD-10-CM | POA: Diagnosis not present

## 2016-08-06 DIAGNOSIS — M6281 Muscle weakness (generalized): Secondary | ICD-10-CM | POA: Diagnosis not present

## 2016-08-06 NOTE — Therapy (Signed)
[facility] [address] [place], [place], [address] Phone: [phone]   Fax:  [phone]  Physical Therapy Treatment  Patient Details  Name: Cory Barnett MRN: [id number] Date of Birth: 09/28/1946 Referring Provider: Dr. Margit Banda  Encounter Date: 08/06/2016      PT End of Session - 08/06/16 1602    Visit Number 7   Number of Visits 10   Date for PT Re-Evaluation 09/01/16   Authorization Type Medicare G codes;  KX at visit 15   PT Start Time 1015   PT Stop Time 1100   PT Time Calculation (min) 45 min   Activity Tolerance Patient tolerated treatment well      Past Medical History:  Diagnosis Date  . Hyperlipidemia   . Hypertension   . Rheumatic fever     Past Surgical History:  Procedure Laterality Date  . NOSE SURGERY    . TONSILLECTOMY      There were no vitals filed for this visit.      Subjective Assessment - 08/06/16 1017    Subjective I did all my exercises this morning.  I like those exercises with the band.  I really felt good after Tuesday session, slept well.  I do have some left low back pain from straining on Monday to assist my wife.  Going to work out with the trainer today.     Currently in Pain? No/denies   Pain Score 0-No pain   Pain Location Knee   Pain Orientation Right   Pain Type Chronic pain   Pain Frequency Intermittent                         [facility] Adult PT Treatment/Exercise - 08/06/16 0001      Knee/Hip Exercises: Stretches   Other Knee/Hip Stretches sidelying ITB stretch 3x  30 sec   Other Knee/Hip Stretches verbal review of HEP including band, open chain ex     Knee/Hip Exercises: Aerobic   Nustep 10 min L1     Iontophoresis   Type of Iontophoresis Dexamethasone   Location lateral knee joint line   Dose 4 mg/ml #5   Time 4-6 hour patch     Manual Therapy   Manual therapy comments foam roll ITB 4 min   Joint Mobilization grade 3 patellar  medial glides 15x   Soft tissue mobilization Instrument assisted (Graston) G4 to lateral quads and HS                   PT Short Term Goals - 08/06/16 1605      PT SHORT TERM GOAL #1   Title The patient will demonstrate compliance with initial HEP for ROM and low level strengthening   08/04/16   Status Achieved     PT SHORT TERM GOAL #2   Title The patient will report a 30% improvement in night pain and pain with usual ADLs   Status Achieved     PT SHORT TERM GOAL #3   Title Right knee flexion improved to 130 degrees needed for greater ease with ascending stairs   Status Achieved     PT SHORT TERM GOAL #4   Title HS length to 60 degrees needed for improved stride length when ambulating   Status Achieved           PT Long Term Goals - 08/06/16 1605      PT LONG TERM GOAL #1  Title The patient will be independent in safe self progression of HEP and appropriate gym ex's for further improvements in strength  09/01/16   Time 8   Period Weeks   Status On-going     PT LONG TERM GOAL #2   Title The patient will have improved right quad and gluteus medius strength to 5-/5 needed for ascending steps at home with less pain and decreased incidence of knee giveway   Time 8   Period Weeks   Status On-going     PT LONG TERM GOAL #3   Title The patient will have improved pain at night and usual ADLs by at least 60%   Time 8   Period Weeks   Status On-going     PT LONG TERM GOAL #4   Title FOTO functional outcome score improved from 53% limitation to 38% indicating improved function with less pain   Time 8   Period Weeks   Status On-going               Plan - 08/06/16 1602    Clinical Impression Statement The patient reports his knee is feeling better overall.  He demonstrates good compliance with HEP including stretching and responds well to open chain quad strengthening.  Closed chain easily exacerbates his retropatellar pain.  Tender points along ITB,  lateral quads and HS.  Improved muscle length following treatment plan.  Should meet remaining goals in 2-3 visits.     PT Next Visit Plan DN ITB #5;  1 more ionto patch;  open chain strengthening      Patient will benefit from skilled therapeutic intervention in order to improve the following deficits and impairments:     Visit Diagnosis: Chronic pain of right knee  Muscle weakness (generalized)  Stiffness of right knee, not elsewhere classified  Difficulty in walking, not elsewhere classified     Problem List There are no active problems to display for this patient.  [name], PT 08/06/16 4:08 PM Phone: [phone] Fax: [phone]  [name] 08/06/2016, 4:07 PM  [facility] [address], [place], [place], [address] Phone: [phone]   Fax:  [phone]  Name: ZORAN SCHEID MRN: [id number] Date of Birth: [birth year]

## 2016-08-18 ENCOUNTER — Ambulatory Visit: Payer: Medicare Other | Admitting: Physical Therapy

## 2016-08-18 DIAGNOSIS — G8929 Other chronic pain: Secondary | ICD-10-CM

## 2016-08-18 DIAGNOSIS — M25561 Pain in right knee: Principal | ICD-10-CM

## 2016-08-18 DIAGNOSIS — M25661 Stiffness of right knee, not elsewhere classified: Secondary | ICD-10-CM

## 2016-08-18 DIAGNOSIS — R262 Difficulty in walking, not elsewhere classified: Secondary | ICD-10-CM | POA: Diagnosis not present

## 2016-08-18 DIAGNOSIS — M6281 Muscle weakness (generalized): Secondary | ICD-10-CM | POA: Diagnosis not present

## 2016-08-18 NOTE — Therapy (Signed)
[facility] [address] [place], [place], [address] Phone: [phone]   Fax:  [phone]  Physical Therapy Treatment  Patient Details  Name: Cory Barnett MRN: [id number] Date of Birth: [birth year] Referring Provider: Dr. Margit Banda  Encounter Date: 08/18/2016      PT End of Session - 08/18/16 1010    Visit Number 8   Number of Visits 10   Date for PT Re-Evaluation 09/01/16   Authorization Type Medicare G codes;  KX at visit 15   PT Start Time 0925   PT Stop Time 1010   PT Time Calculation (min) 45 min   Activity Tolerance Patient tolerated treatment well      Past Medical History:  Diagnosis Date  . Hyperlipidemia   . Hypertension   . Rheumatic fever     Past Surgical History:  Procedure Laterality Date  . NOSE SURGERY    . TONSILLECTOMY      There were no vitals filed for this visit.      Subjective Assessment - 08/18/16 0935    Subjective I think this knee is getting better.  I drove to [facility] with minimal discomfort.  Working with trainer on rowing machine using my arms more than legs.      Currently in Pain? Yes   Pain Score 2    Pain Location Knee   Pain Orientation Right   Pain Type Chronic pain   Pain Frequency Intermittent                         [facility] Adult PT Treatment/Exercise - 08/18/16 0001      Knee/Hip Exercises: Stretches   Other Knee/Hip Stretches sidelying ITB stretch 3x  30 sec     Knee/Hip Exercises: Aerobic   Nustep 10 min L1     Knee/Hip Exercises: Machines for Strengthening   Cybex Knee Extension 10# Bilateral small range 30 degrees to 0 degrees 2x10     Iontophoresis   Type of Iontophoresis Dexamethasone   Location lateral knee joint line   Dose 4 mg/ml #6   Time 4-6 hour patch     Manual soft tissue mobilization to TFL, lateral quads and lateral HS;  Grade 3 patellar medial glides in sidelying 10x     Trigger Point Dry Needling - 08/18/16 1009     Consent Given? Yes   Tensor Fascia Lata Response Twitch response elicited;Palpable increased muscle length   Quadriceps Response Twitch response elicited;Palpable increased muscle length   Hamstring Response Twitch response elicited;Palpable increased muscle length                PT Short Term Goals - 08/18/16 2052      PT SHORT TERM GOAL #1   Title The patient will demonstrate compliance with initial HEP for ROM and low level strengthening   08/04/16   Status Achieved     PT SHORT TERM GOAL #2   Title The patient will report a 30% improvement in night pain and pain with usual ADLs   Status Achieved     PT SHORT TERM GOAL #3   Title Right knee flexion improved to 130 degrees needed for greater ease with ascending stairs   Status Achieved     PT SHORT TERM GOAL #4   Title HS length to 60 degrees needed for improved stride length when ambulating   Status Achieved  PT Long Term Goals - 08/18/16 2052      PT LONG TERM GOAL #1   Title The patient will be independent in safe self progression of HEP and appropriate gym ex's for further improvements in strength  09/01/16   Time 8   Period Weeks   Status On-going     PT LONG TERM GOAL #2   Title The patient will have improved right quad and gluteus medius strength to 5-/5 needed for ascending steps at home with less pain and decreased incidence of knee giveway   Time 8   Period Weeks   Status On-going     PT LONG TERM GOAL #3   Title The patient will have improved pain at night and usual ADLs by at least 60%   Time 8   Period Weeks   Status On-going     PT LONG TERM GOAL #4   Title FOTO functional outcome score improved from 53% limitation to 38% indicating improved function with less pain   Time 8   Period Weeks   Status On-going               Plan - 08/18/16 2048    Clinical Impression Statement The patient reports good improvement in his overall pain intensity.  He does have  retropatellar pain with closed chain strengthening and with long arc quad motion but is able to do open chain strengthening in a small arc range of 0-30 degrees.  Decreased lateral thigh tender points.  Last ionto completed today.  Assess progress toward goals next visit for probable discharge from PT if majority met.     PT Next Visit Plan FOTO;  recheck knee AROM;  HS length;  review HEP;  possible discharge;  MMT quads and glute medius      Patient will benefit from skilled therapeutic intervention in order to improve the following deficits and impairments:     Visit Diagnosis: Chronic pain of right knee  Muscle weakness (generalized)  Stiffness of right knee, not elsewhere classified  Difficulty in walking, not elsewhere classified     Problem List There are no active problems to display for this patient.  Stacy Simpson, PT 08/18/16 8:56 PM Phone: [phone] Fax: [phone]  Simpson, Stacy C 08/18/2016, 8:54 PM  [facility] [address] [place], [place], [address] Phone: [phone]   Fax:  [phone]  Name: Rhyland F Durand MRN: [id number] Date of Birth: 05/06/1946    

## 2016-08-20 ENCOUNTER — Ambulatory Visit: Payer: Medicare Other | Admitting: Physical Therapy

## 2016-08-20 DIAGNOSIS — M25561 Pain in right knee: Secondary | ICD-10-CM | POA: Diagnosis not present

## 2016-08-20 DIAGNOSIS — G8929 Other chronic pain: Secondary | ICD-10-CM | POA: Diagnosis not present

## 2016-08-20 DIAGNOSIS — M6281 Muscle weakness (generalized): Secondary | ICD-10-CM | POA: Diagnosis not present

## 2016-08-20 DIAGNOSIS — M25661 Stiffness of right knee, not elsewhere classified: Secondary | ICD-10-CM | POA: Diagnosis not present

## 2016-08-20 DIAGNOSIS — R262 Difficulty in walking, not elsewhere classified: Secondary | ICD-10-CM | POA: Diagnosis not present

## 2016-08-20 NOTE — Therapy (Signed)
[facility] [address], [place], [place], [address] Phone: [phone]   Fax:  [phone]  Physical Therapy Treatment/Discharge Summary  Patient Details  Name: Cory Barnett MRN: [id number] Date of Birth: 03/24/1946 Referring Provider: Dr. Margit Banda  Encounter Date: 08/20/2016      PT End of Session - 08/20/16 1316    Visit Number 9   Number of Visits 10   Date for PT Re-Evaluation 09/01/16   Authorization Type Medicare G codes;  KX at visit 15   PT Start Time 1225   PT Stop Time 1314   PT Time Calculation (min) 49 min   Activity Tolerance Patient tolerated treatment well      Past Medical History:  Diagnosis Date  . Hyperlipidemia   . Hypertension   . Rheumatic fever     Past Surgical History:  Procedure Laterality Date  . NOSE SURGERY    . TONSILLECTOMY      There were no vitals filed for this visit.      Subjective Assessment - 08/20/16 1233    Subjective Patient states he went to the gym this morning and was light headed.  I think it was my blood pressure medicine.  I feel better now.   I think my knee is doing so much better.     Currently in Pain? Yes   Pain Score 2    Pain Location Knee   Pain Orientation Right   Pain Type Chronic pain   Pain Frequency Intermittent            [facility] PT Assessment - 08/20/16 0001      Observation/Other Assessments   Focus on Therapeutic Outcomes (FOTO)  38% limitation      AROM   Right Knee Extension 0   Right Knee Flexion 138     Strength   Right Hip ABduction 5/5   Left Hip ABduction 5/5   Right Knee Flexion  5-/5   Right Knee Extension 5/5     Flexibility   Hamstrings 80 degrees on right                     [facility] Adult PT Treatment/Exercise - 08/20/16 0001      Knee/Hip Exercises: Stretches   Active Hamstring Stretch Right;Left;3 reps;30 seconds   Active Hamstring Stretch Limitations with strap supine   Other Knee/Hip  Stretches psoas doorway stretch right/left 3x5 each with UE movements     Knee/Hip Exercises: Machines for Strengthening   Cybex Knee Extension 10# Bilateral small range 30 degrees to 0 degrees 2x10     Knee/Hip Exercises: Standing   SLS bilaterally 10 sec   Other Standing Knee Exercises 2 inch step downs 3x right and left  right knee crepitus                PT Education - 08/20/16 1313    Education provided Yes   Education Details Discussion on inflammation management using ice, elevation;  avoidance of painful ranges;  safe self progression of HEP   Person(s) Educated Patient   Methods Explanation;Handout   Comprehension Verbalized understanding;Returned demonstration          PT Short Term Goals - 08/20/16 1256      PT SHORT TERM GOAL #1   Title The patient will demonstrate compliance with initial HEP for ROM and low level strengthening   08/04/16   Status Achieved     PT SHORT TERM GOAL #  2   Title The patient will report a 30% improvement in night pain and pain with usual ADLs   Status Achieved     PT SHORT TERM GOAL #3   Title Right knee flexion improved to 130 degrees needed for greater ease with ascending stairs   Status Achieved     PT SHORT TERM GOAL #4   Title HS length to 60 degrees needed for improved stride length when ambulating   Status Achieved           PT Long Term Goals - Sep 17, 2016 1253      PT LONG TERM GOAL #1   Title The patient will be independent in safe self progression of HEP and appropriate gym ex's for further improvements in strength  09/01/16   Status Achieved     PT LONG TERM GOAL #2   Title The patient will have improved right quad and gluteus medius strength to 5-/5 needed for ascending steps at home with less pain and decreased incidence of knee giveway   Status Achieved     PT LONG TERM GOAL #3   Title The patient will have improved pain at night and usual ADLs by at least 60%   Status Achieved     PT LONG TERM GOAL  #4   Title FOTO functional outcome score improved from 53% limitation to 38% indicating improved function with less pain   Status Achieved               Plan - 2016/09/17 1317    Clinical Impression Statement The patient has made excellent improvements in knee AROM, hip and knee strength, pain reduction and functional improvements.  His FOTO has improved from 53% to 38% indicating improved function with less pain.  He responded well to PT interventions including iontophoresis, dry needling, Graston manual therapy, therapeutic ex for strengthening and ROM.  He is independent in safe self progression of HEP and has met all rehab goals.  Discharge from PT at this time.        Patient will benefit from skilled therapeutic intervention in order to improve the following deficits and impairments:     Visit Diagnosis: Chronic pain of right knee  Muscle weakness (generalized)  Stiffness of right knee, not elsewhere classified  Difficulty in walking, not elsewhere classified       G-Codes - 09/17/16 1321    Functional Assessment Tool Used FOTO; clinical judgement    Functional Limitation Mobility: Walking and moving around   Mobility: Walking and Moving Around Goal Status [phone]) At least 20 percent but less than 40 percent impaired, limited or restricted   Mobility: Walking and Moving Around Discharge Status [phone]) At least 20 percent but less than 40 percent impaired, limited or restricted     PHYSICAL THERAPY DISCHARGE SUMMARY  Visits from Start of Care: 9  Current functional level related to goals / functional outcomes: See clinical impressions above.  All goals met.  Patient reports an overall improvement at 60%.     Remaining deficits: As above   Education / Equipment: Comprehensive HEP Plan: Patient agrees to discharge.  Patient goals were met. Patient is being discharged due to meeting the stated rehab goals.  ?????        Problem List There are no active  problems to display for this patient.  [name], PT 17-Sep-2016 1:24 PM Phone: [phone] Fax: [phone]  [name] 09-17-2016, 1:23 PM  [facility] [address]  [address], [place], [place], [address] Phone: [phone]   Fax:  [phone]  Name: KHAMANI FAIRLEY MRN: [id number] Date of Birth: 04-11-1946

## 2016-08-25 ENCOUNTER — Encounter: Payer: Medicare Other | Admitting: Physical Therapy

## 2016-08-27 ENCOUNTER — Encounter: Payer: Medicare Other | Admitting: Physical Therapy

## 2016-08-31 DIAGNOSIS — Z840 Family history of diseases of the skin and subcutaneous tissue: Secondary | ICD-10-CM | POA: Diagnosis not present

## 2016-08-31 DIAGNOSIS — L570 Actinic keratosis: Secondary | ICD-10-CM | POA: Diagnosis not present

## 2016-08-31 DIAGNOSIS — D485 Neoplasm of uncertain behavior of skin: Secondary | ICD-10-CM | POA: Diagnosis not present

## 2016-08-31 DIAGNOSIS — D1723 Benign lipomatous neoplasm of skin and subcutaneous tissue of right leg: Secondary | ICD-10-CM | POA: Diagnosis not present

## 2016-08-31 DIAGNOSIS — Z85828 Personal history of other malignant neoplasm of skin: Secondary | ICD-10-CM | POA: Diagnosis not present

## 2016-09-07 DIAGNOSIS — Z23 Encounter for immunization: Secondary | ICD-10-CM | POA: Diagnosis not present

## 2016-09-07 DIAGNOSIS — Z4802 Encounter for removal of sutures: Secondary | ICD-10-CM | POA: Diagnosis not present

## 2016-11-02 DIAGNOSIS — R972 Elevated prostate specific antigen [PSA]: Secondary | ICD-10-CM

## 2016-11-02 HISTORY — DX: Elevated prostate specific antigen (PSA): R97.20

## 2016-11-11 DIAGNOSIS — H2513 Age-related nuclear cataract, bilateral: Secondary | ICD-10-CM | POA: Diagnosis not present

## 2016-11-25 DIAGNOSIS — L820 Inflamed seborrheic keratosis: Secondary | ICD-10-CM | POA: Diagnosis not present

## 2016-11-25 DIAGNOSIS — L570 Actinic keratosis: Secondary | ICD-10-CM | POA: Diagnosis not present

## 2016-11-25 DIAGNOSIS — L905 Scar conditions and fibrosis of skin: Secondary | ICD-10-CM | POA: Diagnosis not present

## 2016-11-25 DIAGNOSIS — L814 Other melanin hyperpigmentation: Secondary | ICD-10-CM | POA: Diagnosis not present

## 2016-11-25 DIAGNOSIS — D224 Melanocytic nevi of scalp and neck: Secondary | ICD-10-CM | POA: Diagnosis not present

## 2016-11-25 DIAGNOSIS — Z85828 Personal history of other malignant neoplasm of skin: Secondary | ICD-10-CM | POA: Diagnosis not present

## 2016-11-25 DIAGNOSIS — D485 Neoplasm of uncertain behavior of skin: Secondary | ICD-10-CM | POA: Diagnosis not present

## 2016-11-30 DIAGNOSIS — L820 Inflamed seborrheic keratosis: Secondary | ICD-10-CM | POA: Diagnosis not present

## 2016-11-30 DIAGNOSIS — Z840 Family history of diseases of the skin and subcutaneous tissue: Secondary | ICD-10-CM | POA: Diagnosis not present

## 2016-11-30 DIAGNOSIS — D485 Neoplasm of uncertain behavior of skin: Secondary | ICD-10-CM | POA: Diagnosis not present

## 2016-11-30 DIAGNOSIS — Z85828 Personal history of other malignant neoplasm of skin: Secondary | ICD-10-CM | POA: Diagnosis not present

## 2016-11-30 NOTE — Progress Notes (Signed)
      HPI: FU palpitations. Patient states he had a stress nuclear study approximately 5 years ago. Records not available but apparently normal. Echo 8/17 showed normal LV function, mild LAE and trace AI. Abd ultrasound 8/17 showed no AAA. Previous description of palpitations felt c/w PACs or PVCs. Since last seen, patient denies dyspnea on exertion, orthopnea, PND, pedal edema, exertional chest pain or syncope. Occasional brief skip but no sustained palpitations.  Current Outpatient Prescriptions  Medication Sig Dispense Refill  . Cholecalciferol (VITAMIN D-1000 MAX ST) 1000 units tablet Take 1,500 mg by mouth daily.    . Multiple Vitamin (MULTI-VITAMINS) TABS Take 1 tablet by mouth daily.    . rosuvastatin (CRESTOR) 10 MG tablet Take 10 mg by mouth daily.    . Saw Palmetto 160 MG CAPS Take 1,500 mg by mouth daily.    Marland Kitchen losartan (COZAAR) 50 MG tablet Take 1 tablet (50 mg total) by mouth daily. 90 tablet 3   No current facility-administered medications for this visit.      Past Medical History:  Diagnosis Date  . Hyperlipidemia   . Hypertension   . Rheumatic fever     Past Surgical History:  Procedure Laterality Date  . NOSE SURGERY    . TONSILLECTOMY      Social History   Social History  . Marital status: Married    Spouse name: N/A  . Number of children: 3  . Years of education: N/A   Occupational History  .      Retired   Social History Main Topics  . Smoking status: Former Research scientist (life sciences)  . Smokeless tobacco: Never Used  . Alcohol use 1.2 oz/week    2 Cans of beer per week  . Drug use: Unknown  . Sexual activity: Not on file   Other Topics Concern  . Not on file   Social History Narrative  . No narrative on file    Family History  Problem Relation Age of Onset  . Stroke Mother     ROS: some issues with erectile dysfunction but no fevers or chills, productive cough, hemoptysis, dysphasia, odynophagia, melena, hematochezia, dysuria, hematuria, rash, seizure  activity, orthopnea, PND, pedal edema, claudication. Remaining systems are negative.  Physical Exam: Well-developed well-nourished in no acute distress.  Skin is warm and dry.  HEENT is normal.  Neck is supple. No bruits Chest is clear to auscultation with normal expansion.  Cardiovascular exam is regular rate and rhythm.  Abdominal exam nontender or distended. No masses palpated. Extremities show no edema. neuro grossly intact  ECG-sinus rhythm at a rate of 74. 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  A/P  1 palpitations-symptoms appear to be reasonably stable. Likely related to PVCs or PACs. We can consider an event monitor in the future if they worsen.  2 hypertension-blood pressure is mildly elevated. However he follows this at home and it is controlled. Continue Cozaar 50 mg daily and follow. Potassium and renal function monitored by primary care.  3 hyperlipidemia-continue statin. Patient's lipids and liver are monitored by primary care.   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 He was instructed to never take nitroglycerin within 36 hours of taking a tablet.  Kirk Ruths, MD

## 2016-12-07 ENCOUNTER — Ambulatory Visit (INDEPENDENT_AMBULATORY_CARE_PROVIDER_SITE_OTHER): Payer: Medicare Other | Admitting: Cardiology

## 2016-12-07 ENCOUNTER — Encounter: Payer: Self-pay | Admitting: Cardiology

## 2016-12-07 VITALS — BP 142/78 | HR 74 | Ht 73.0 in | Wt 219.0 lb

## 2016-12-07 DIAGNOSIS — I10 Essential (primary) hypertension: Secondary | ICD-10-CM

## 2016-12-07 DIAGNOSIS — E7800 Pure hypercholesterolemia, unspecified: Secondary | ICD-10-CM

## 2016-12-07 DIAGNOSIS — R002 Palpitations: Secondary | ICD-10-CM

## 2016-12-07 MED ORDER — TADALAFIL 10 MG PO TABS: 10.0000 mg | ORAL_TABLET | Freq: Every day | ORAL | 0 refills | Status: DC | PRN

## 2016-12-07 NOTE — Patient Instructions (Signed)
Medication Instructions:   TAKE CIALIS 10 MG AS NEEDED  Follow-Up:  Your physician wants you to follow-up in: [place] will receive a reminder letter in the mail two months in advance. If you don't receive a letter, please call our office to schedule the follow-up appointment.   If you need a refill on your cardiac medications before your next appointment, please call your pharmacy.

## 2016-12-11 ENCOUNTER — Encounter: Payer: Self-pay | Admitting: Cardiology

## 2016-12-17 DIAGNOSIS — E784 Other hyperlipidemia: Secondary | ICD-10-CM | POA: Diagnosis not present

## 2016-12-17 DIAGNOSIS — Z0000 Encounter for general adult medical examination without abnormal findings: Secondary | ICD-10-CM | POA: Diagnosis not present

## 2016-12-17 DIAGNOSIS — Z125 Encounter for screening for malignant neoplasm of prostate: Secondary | ICD-10-CM | POA: Diagnosis not present

## 2016-12-17 DIAGNOSIS — I10 Essential (primary) hypertension: Secondary | ICD-10-CM | POA: Diagnosis not present

## 2016-12-17 DIAGNOSIS — R7301 Impaired fasting glucose: Secondary | ICD-10-CM | POA: Diagnosis not present

## 2016-12-24 DIAGNOSIS — Z0000 Encounter for general adult medical examination without abnormal findings: Secondary | ICD-10-CM | POA: Diagnosis not present

## 2016-12-24 DIAGNOSIS — I10 Essential (primary) hypertension: Secondary | ICD-10-CM | POA: Diagnosis not present

## 2016-12-24 DIAGNOSIS — M13869 Other specified arthritis, unspecified knee: Secondary | ICD-10-CM | POA: Diagnosis not present

## 2016-12-24 DIAGNOSIS — N401 Enlarged prostate with lower urinary tract symptoms: Secondary | ICD-10-CM | POA: Diagnosis not present

## 2016-12-24 DIAGNOSIS — R7301 Impaired fasting glucose: Secondary | ICD-10-CM | POA: Diagnosis not present

## 2016-12-24 DIAGNOSIS — F418 Other specified anxiety disorders: Secondary | ICD-10-CM | POA: Diagnosis not present

## 2016-12-24 DIAGNOSIS — Z1389 Encounter for screening for other disorder: Secondary | ICD-10-CM | POA: Diagnosis not present

## 2016-12-24 DIAGNOSIS — J028 Acute pharyngitis due to other specified organisms: Secondary | ICD-10-CM | POA: Diagnosis not present

## 2016-12-24 DIAGNOSIS — R002 Palpitations: Secondary | ICD-10-CM | POA: Diagnosis not present

## 2016-12-24 DIAGNOSIS — E784 Other hyperlipidemia: Secondary | ICD-10-CM | POA: Diagnosis not present

## 2016-12-24 DIAGNOSIS — Z6829 Body mass index (BMI) 29.0-29.9, adult: Secondary | ICD-10-CM | POA: Diagnosis not present

## 2016-12-24 DIAGNOSIS — R6882 Decreased libido: Secondary | ICD-10-CM | POA: Diagnosis not present

## 2016-12-25 DIAGNOSIS — Z1212 Encounter for screening for malignant neoplasm of rectum: Secondary | ICD-10-CM | POA: Diagnosis not present

## 2017-04-29 DIAGNOSIS — Z85828 Personal history of other malignant neoplasm of skin: Secondary | ICD-10-CM | POA: Diagnosis not present

## 2017-04-29 DIAGNOSIS — L814 Other melanin hyperpigmentation: Secondary | ICD-10-CM | POA: Diagnosis not present

## 2017-04-29 DIAGNOSIS — L820 Inflamed seborrheic keratosis: Secondary | ICD-10-CM | POA: Diagnosis not present

## 2017-04-29 DIAGNOSIS — D485 Neoplasm of uncertain behavior of skin: Secondary | ICD-10-CM | POA: Diagnosis not present

## 2017-04-29 DIAGNOSIS — C44319 Basal cell carcinoma of skin of other parts of face: Secondary | ICD-10-CM | POA: Diagnosis not present

## 2017-04-29 DIAGNOSIS — D225 Melanocytic nevi of trunk: Secondary | ICD-10-CM | POA: Diagnosis not present

## 2017-04-29 DIAGNOSIS — L570 Actinic keratosis: Secondary | ICD-10-CM | POA: Diagnosis not present

## 2017-05-10 DIAGNOSIS — R7301 Impaired fasting glucose: Secondary | ICD-10-CM | POA: Diagnosis not present

## 2017-05-10 DIAGNOSIS — R972 Elevated prostate specific antigen [PSA]: Secondary | ICD-10-CM | POA: Diagnosis not present

## 2017-05-12 DIAGNOSIS — C44319 Basal cell carcinoma of skin of other parts of face: Secondary | ICD-10-CM | POA: Diagnosis not present

## 2017-05-12 DIAGNOSIS — L570 Actinic keratosis: Secondary | ICD-10-CM | POA: Diagnosis not present

## 2017-06-04 DIAGNOSIS — Z08 Encounter for follow-up examination after completed treatment for malignant neoplasm: Secondary | ICD-10-CM | POA: Diagnosis not present

## 2017-06-04 DIAGNOSIS — Z85828 Personal history of other malignant neoplasm of skin: Secondary | ICD-10-CM | POA: Diagnosis not present

## 2017-06-11 ENCOUNTER — Other Ambulatory Visit: Payer: Self-pay | Admitting: Cardiology

## 2017-08-05 DIAGNOSIS — Z23 Encounter for immunization: Secondary | ICD-10-CM | POA: Diagnosis not present

## 2017-08-09 DIAGNOSIS — L570 Actinic keratosis: Secondary | ICD-10-CM | POA: Diagnosis not present

## 2017-09-10 ENCOUNTER — Other Ambulatory Visit: Payer: Self-pay | Admitting: Cardiology

## 2017-10-21 DIAGNOSIS — J309 Allergic rhinitis, unspecified: Secondary | ICD-10-CM | POA: Diagnosis not present

## 2017-11-15 DIAGNOSIS — Z23 Encounter for immunization: Secondary | ICD-10-CM | POA: Diagnosis not present

## 2017-11-15 DIAGNOSIS — Z85828 Personal history of other malignant neoplasm of skin: Secondary | ICD-10-CM | POA: Diagnosis not present

## 2017-11-15 DIAGNOSIS — L821 Other seborrheic keratosis: Secondary | ICD-10-CM | POA: Diagnosis not present

## 2017-11-15 DIAGNOSIS — L570 Actinic keratosis: Secondary | ICD-10-CM | POA: Diagnosis not present

## 2017-11-23 DIAGNOSIS — H35033 Hypertensive retinopathy, bilateral: Secondary | ICD-10-CM | POA: Diagnosis not present

## 2017-11-23 DIAGNOSIS — H2513 Age-related nuclear cataract, bilateral: Secondary | ICD-10-CM | POA: Diagnosis not present

## 2017-12-22 DIAGNOSIS — H1851 Endothelial corneal dystrophy: Secondary | ICD-10-CM | POA: Diagnosis not present

## 2017-12-22 DIAGNOSIS — H25013 Cortical age-related cataract, bilateral: Secondary | ICD-10-CM | POA: Diagnosis not present

## 2017-12-22 DIAGNOSIS — H2513 Age-related nuclear cataract, bilateral: Secondary | ICD-10-CM | POA: Diagnosis not present

## 2017-12-22 DIAGNOSIS — H524 Presbyopia: Secondary | ICD-10-CM | POA: Diagnosis not present

## 2018-01-12 DIAGNOSIS — L570 Actinic keratosis: Secondary | ICD-10-CM | POA: Diagnosis not present

## 2018-02-01 ENCOUNTER — Other Ambulatory Visit: Payer: Self-pay | Admitting: Cardiology

## 2018-02-01 NOTE — Progress Notes (Signed)
HPI: FU palpitations.  Nuclear study June 2009 showed ejection fraction 66% and no ischemia or infarction.  Echo 8/17 showed normal LV function, mild LAE and trace AI. Abd ultrasound 8/17 showed no AAA. Previous description of palpitations felt c/w PACs or PVCs. Since last seen, the patient has dyspnea with more extreme activities but not with routine activities. It is relieved with rest. It is not associated with chest pain. There is no orthopnea, PND or pedal edema. There is no syncope or palpitations. There is no exertional chest pain.    Current Outpatient Medications  Medication Sig Dispense Refill  . Cholecalciferol (VITAMIN D-1000 MAX ST) 1000 units tablet Take 1,500 mg by mouth daily.    . Coenzyme Q10 (CO Q-10) 100 MG CAPS Take by mouth.    . losartan (COZAAR) 50 MG tablet TAKE 1 TABLET BY MOUTH EVERY DAY 90 tablet 0  . Multiple Vitamin (MULTI-VITAMINS) TABS Take 1 tablet by mouth daily.    . rosuvastatin (CRESTOR) 10 MG tablet Take 10 mg by mouth daily.    . Saw Palmetto 160 MG CAPS Take 1,500 mg by mouth daily.    . tadalafil (CIALIS) 10 MG tablet Take 1 tablet (10 mg total) by mouth daily as needed for erectile dysfunction. 10 tablet 0   No current facility-administered medications for this visit.      Past Medical History:  Diagnosis Date  . Hyperlipidemia   . Hypertension   . Rheumatic fever     Past Surgical History:  Procedure Laterality Date  . NOSE SURGERY    . TONSILLECTOMY      Social History   Socioeconomic History  . Marital status: Married    Spouse name: Not on file  . Number of children: 3  . Years of education: Not on file  . Highest education level: Not on file  Occupational History    Comment: Retired  Scientific laboratory technician  . Financial resource strain: Not on file  . Food insecurity:    Worry: Not on file    Inability: Not on file  . Transportation needs:    Medical: Not on file    Non-medical: Not on file  Tobacco Use  . Smoking status:  Former Research scientist (life sciences)  . Smokeless tobacco: Never Used  Substance and Sexual Activity  . Alcohol use: Yes    Alcohol/week: 1.2 oz    Types: 2 Cans of beer per week  . Drug use: Not on file  . Sexual activity: Not on file  Lifestyle  . Physical activity:    Days per week: Not on file    Minutes per session: Not on file  . Stress: Not on file  Relationships  . Social connections:    Talks on phone: Not on file    Gets together: Not on file    Attends religious service: Not on file    Active member of club or organization: Not on file    Attends meetings of clubs or organizations: Not on file    Relationship status: Not on file  . Intimate partner violence:    Fear of current or ex partner: Not on file    Emotionally abused: Not on file    Physically abused: Not on file    Forced sexual activity: Not on file  Other Topics Concern  . Not on file  Social History Narrative  . Not on file    Family History  Problem Relation Age of Onset  .  Stroke Mother     ROS: no fevers or chills, productive cough, hemoptysis, dysphasia, odynophagia, melena, hematochezia, dysuria, hematuria, rash, seizure activity, orthopnea, PND, pedal edema, claudication. Remaining systems are negative.  Physical Exam: Well-developed well-nourished in no acute distress.  Skin is warm and dry.  HEENT is normal.  Neck is supple.  Chest is clear to auscultation with normal expansion.  Cardiovascular exam is regular rate and rhythm.  Abdominal exam nontender or distended. No masses palpated. Extremities show no edema. neuro grossly intact  ECG-normal sinus rhythm at a rate of 77.  Right bundle branch block.  First-degree AV block.  Personally reviewed  A/P  1 palpitations-symptoms are reasonably well controlled.  We have felt previously these are secondary to PVCs or PACs.  We can consider a monitor in the future if symptoms worsen.   2 hypertension-blood pressure is controlled.  Continue present medications.  K and renal function followed by primary care.  3 hyperlipidemia-continue statin.  Patient's lipids and liver are monitored by primary care.  Kirk Ruths, MD

## 2018-02-04 DIAGNOSIS — R7301 Impaired fasting glucose: Secondary | ICD-10-CM | POA: Diagnosis not present

## 2018-02-04 DIAGNOSIS — R82998 Other abnormal findings in urine: Secondary | ICD-10-CM | POA: Diagnosis not present

## 2018-02-04 DIAGNOSIS — I10 Essential (primary) hypertension: Secondary | ICD-10-CM | POA: Diagnosis not present

## 2018-02-04 DIAGNOSIS — E7849 Other hyperlipidemia: Secondary | ICD-10-CM | POA: Diagnosis not present

## 2018-02-04 DIAGNOSIS — Z125 Encounter for screening for malignant neoplasm of prostate: Secondary | ICD-10-CM | POA: Diagnosis not present

## 2018-02-08 ENCOUNTER — Ambulatory Visit (INDEPENDENT_AMBULATORY_CARE_PROVIDER_SITE_OTHER): Payer: Medicare Other | Admitting: Cardiology

## 2018-02-08 ENCOUNTER — Encounter: Payer: Self-pay | Admitting: Cardiology

## 2018-02-08 VITALS — BP 122/80 | HR 77 | Ht 74.0 in | Wt 215.0 lb

## 2018-02-08 DIAGNOSIS — E7800 Pure hypercholesterolemia, unspecified: Secondary | ICD-10-CM | POA: Diagnosis not present

## 2018-02-08 DIAGNOSIS — R002 Palpitations: Secondary | ICD-10-CM | POA: Diagnosis not present

## 2018-02-08 DIAGNOSIS — I10 Essential (primary) hypertension: Secondary | ICD-10-CM | POA: Diagnosis not present

## 2018-02-08 MED ORDER — LOSARTAN POTASSIUM 50 MG PO TABS: 50.0000 mg | ORAL_TABLET | Freq: Every day | ORAL | 0 refills | Status: DC

## 2018-02-08 NOTE — Patient Instructions (Signed)
Your physician wants you to follow-up in: ONE YEAR WITH DR CRENSHAW You will receive a reminder letter in the mail two months in advance. If you don't receive a letter, please call our office to schedule the follow-up appointment.   If you need a refill on your cardiac medications before your next appointment, please call your pharmacy.  

## 2018-02-11 ENCOUNTER — Encounter: Payer: Self-pay | Admitting: Gastroenterology

## 2018-02-11 DIAGNOSIS — R002 Palpitations: Secondary | ICD-10-CM | POA: Diagnosis not present

## 2018-02-11 DIAGNOSIS — M13869 Other specified arthritis, unspecified knee: Secondary | ICD-10-CM | POA: Diagnosis not present

## 2018-02-11 DIAGNOSIS — R7301 Impaired fasting glucose: Secondary | ICD-10-CM | POA: Diagnosis not present

## 2018-02-11 DIAGNOSIS — I440 Atrioventricular block, first degree: Secondary | ICD-10-CM | POA: Diagnosis not present

## 2018-02-11 DIAGNOSIS — E291 Testicular hypofunction: Secondary | ICD-10-CM | POA: Diagnosis not present

## 2018-02-11 DIAGNOSIS — Z1389 Encounter for screening for other disorder: Secondary | ICD-10-CM | POA: Diagnosis not present

## 2018-02-11 DIAGNOSIS — R972 Elevated prostate specific antigen [PSA]: Secondary | ICD-10-CM | POA: Diagnosis not present

## 2018-02-11 DIAGNOSIS — I4510 Unspecified right bundle-branch block: Secondary | ICD-10-CM | POA: Diagnosis not present

## 2018-02-11 DIAGNOSIS — J309 Allergic rhinitis, unspecified: Secondary | ICD-10-CM | POA: Diagnosis not present

## 2018-02-11 DIAGNOSIS — Z6827 Body mass index (BMI) 27.0-27.9, adult: Secondary | ICD-10-CM | POA: Diagnosis not present

## 2018-02-11 DIAGNOSIS — Z0000 Encounter for general adult medical examination without abnormal findings: Secondary | ICD-10-CM | POA: Diagnosis not present

## 2018-02-11 DIAGNOSIS — F418 Other specified anxiety disorders: Secondary | ICD-10-CM | POA: Diagnosis not present

## 2018-03-10 DIAGNOSIS — Z13820 Encounter for screening for osteoporosis: Secondary | ICD-10-CM | POA: Diagnosis not present

## 2018-03-10 DIAGNOSIS — F419 Anxiety disorder, unspecified: Secondary | ICD-10-CM | POA: Diagnosis not present

## 2018-03-29 ENCOUNTER — Ambulatory Visit (INDEPENDENT_AMBULATORY_CARE_PROVIDER_SITE_OTHER): Payer: Medicare Other | Admitting: Gastroenterology

## 2018-03-29 ENCOUNTER — Encounter: Payer: Self-pay | Admitting: Gastroenterology

## 2018-03-29 ENCOUNTER — Encounter (INDEPENDENT_AMBULATORY_CARE_PROVIDER_SITE_OTHER): Payer: Self-pay

## 2018-03-29 VITALS — BP 152/66 | HR 70 | Ht 74.0 in | Wt 218.0 lb

## 2018-03-29 DIAGNOSIS — K5730 Diverticulosis of large intestine without perforation or abscess without bleeding: Secondary | ICD-10-CM

## 2018-03-29 DIAGNOSIS — R1032 Left lower quadrant pain: Secondary | ICD-10-CM | POA: Diagnosis not present

## 2018-03-29 DIAGNOSIS — K5909 Other constipation: Secondary | ICD-10-CM | POA: Diagnosis not present

## 2018-03-29 MED ORDER — PEG-KCL-NACL-NASULF-NA ASC-C 100 G PO SOLR: 1.0000 | Freq: Once | ORAL | 0 refills | Status: AC

## 2018-03-29 NOTE — Progress Notes (Signed)
[facility] Consult Note:  History: Cory Barnett 03/29/2018  Referring physician: Crist Infante, MD  Reason for consult/chief complaint: Constipation (ongoing issues with constipation. Pt has a BM every 2-3  days. After BM pt will have some sharp LLQ abd pain. Pt )   Subjective  HPI:  This is a very pleasant 72 year old man referred by primary care for chronic constipation and left lower quadrant abdominal pain.  He recalls previous episodes of left lower quadrant pain and tendencies to constipation, which were apparently indications for colonoscopies in the past.  Did not feels that the problem must have improved, because he is really only noticed for about the last year some left lower quadrant crampy pain that might occur before and/or after bowel movement.  He typically has a BM every 2 to 3 days, and when he goes it is large volume.  He denies passage of black stool, and occasionally has blood on the paper that he has attributed to hemorrhoids.  Cory Barnett denies dysphagia, odynophagia, nausea, vomiting, anorexia or weight loss.   ROS:  Review of Systems  Constitutional: Negative for appetite change and unexpected weight change.  HENT: Negative for mouth sores and voice change.   Eyes: Negative for pain and redness.  Respiratory: Negative for cough and shortness of breath.   Cardiovascular: Negative for chest pain and palpitations.  Genitourinary: Negative for dysuria and hematuria.  Musculoskeletal: Positive for arthralgias. Negative for myalgias.  Skin: Negative for pallor and rash.  Neurological: Negative for weakness and headaches.  Hematological: Negative for adenopathy.     Past Medical History: Past Medical History:  Diagnosis Date  . Anxiety   . AV block, 1st degree   . Basal cell carcinoma   . Cardiac murmur   . Cataract   . Elevated PSA measurement 2018  . Hyperlipidemia   . Hypertension 2015  . 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 RBBB (right bundle branch block)   . Rheumatic fever    age 36 or 39, in hospital for a month, out of school for a yr  . Seasonal allergies   . Squamous cell carcinoma      Past Surgical History: Past Surgical History:  Procedure Laterality Date  . NASAL SEPTUM SURGERY    . NOSE SURGERY    . SKIN CANCER EXCISION     basal and squamous cell   . TONSILLECTOMY    . VASECTOMY    . WISDOM TOOTH EXTRACTION       Family History: Family History  Problem Relation Age of Onset  . Stroke Mother   . Arthritis Mother   . Cancer Mother        unknown   . Dementia Mother   . Arthritis Father   . Mesothelioma Father   . Arthritis Sister   . Arthritis Sister   . Bipolar disorder Sister    No known family history of colon or rectal cancer  Social History: Social History   Socioeconomic History  . Marital status: Married    Spouse name: Not on file  . Number of children: 3  . Years of education: Not on file  . Highest education level: Not on file  Occupational History    Comment: Retired  Scientific laboratory technician  . Financial resource strain: Not on file  . Food insecurity:    Worry: Not on file    Inability: Not on file  . Transportation needs:  Medical: Not on file    Non-medical: Not on file  Tobacco Use  . Smoking status: Former Research scientist (life sciences)  . Smokeless tobacco: Never Used  Substance and Sexual Activity  . Alcohol use: Yes    Alcohol/week: 1.2 oz    Types: 2 Cans of beer per week  . Drug use: Never  . Sexual activity: Not on file  Lifestyle  . Physical activity:    Days per week: Not on file    Minutes per session: Not on file  . Stress: Not on file  Relationships  . Social connections:    Talks on phone: Not on file    Gets together: Not on file    Attends religious service: Not on file    Active member of club or organization: Not on file    Attends meetings of clubs or organizations: Not on file    Relationship status: Not on file  Other  Topics Concern  . Not on file  Social History Narrative  . Not on file   He moved here with his wife about 2 years ago to be closer to 2 of their children.  One is a physical therapist, the other is an interventional cardiologist.  Allergies: Allergies  Allergen Reactions  . Moxifloxacin Shortness Of Breath    Outpatient Meds: Current Outpatient Medications  Medication Sig Dispense Refill  . AMBULATORY NON FORMULARY MEDICATION Medication Name: Colon-Plus Caps  Takes 1-2 capsules by mouth daily    . Cholecalciferol (VITAMIN D-1000 MAX ST) 1000 units tablet Take 2,000 mg by mouth daily.     Marland Kitchen escitalopram (LEXAPRO) 5 MG tablet Take 5 mg by mouth daily.    Marland Kitchen losartan (COZAAR) 50 MG tablet Take 1 tablet (50 mg total) by mouth daily. 90 tablet 0  . Multiple Vitamin (MULTI-VITAMINS) TABS Take 1 tablet by mouth daily.    . Omega-3 Fatty Acids (FISH OIL PO) Take 2 g by mouth daily.    . rosuvastatin (CRESTOR) 10 MG tablet Take 10 mg by mouth daily.    . Saw Palmetto 160 MG CAPS Take 1,500 mg by mouth daily.    . tadalafil (CIALIS) 10 MG tablet Take 1 tablet (10 mg total) by mouth daily as needed for erectile dysfunction. 10 tablet 0  . peg 3350 powder (MOVIPREP) 100 g SOLR Take 1 kit (200 g total) by mouth once for 1 dose. 1 kit 0   No current facility-administered medications for this visit.       ___________________________________________________________________ Objective   Exam:  BP (!) 152/66   Pulse 70   Ht 6' 2" (1.88 m)   Wt 218 lb (98.9 kg)   BMI 27.99 kg/m    General: this is a(n) well-appearing man, normal vocal quality and the muscle mass  Eyes: sclera anicteric, no redness  ENT: oral mucosa moist without lesions, no cervical or supraclavicular lymphadenopathy, good dentition  CV: RRR without murmur, S1/S2, no JVD, no peripheral edema  Resp: clear to auscultation bilaterally, normal RR and effort noted  GI: soft, no tenderness, with active bowel sounds. No  guarding or palpable organomegaly noted.  Skin; warm and dry, no rash or jaundice noted  Neuro: awake, alert and oriented x 3. Normal gross motor function and fluent speech  Labs:  Normal CBC April '19  Colonoscopy in [facility] 2006 and 2011 normal except severe left diverticulosis He brought the reports with him.  Assessment: Encounter Diagnoses  Name Primary?  Marland Kitchen LLQ abdominal pain Yes  .  Chronic constipation   . Diverticulosis of colon without hemorrhage     It sounds like he has had these issues chronically, though apparently worse in the last year.  Given that, and a number of years since his last colonoscopy, I advised him to have a colonoscopy at this point.  I would like to be sure there is no neoplastic cause for the pain and constipation.  He takes a stool softener when he remembers to do so.  I recommended that he make an effort to take it daily, and also add a  quarter to half capful a day of MiraLAX powder.  He showed me a digestive health supplement he was taking, it appears to have probiotics and fiber.  I recommended he stop that for now because I think the additional fiber may just be adding bulk but does not seem to be helping him much.  Tejay was agreeable to a colonoscopy after discussion of the procedure and risks.  The benefits and risks of the planned procedure were described in detail with the patient or (when appropriate) their health care proxy.  Risks were outlined as including, but not limited to, bleeding, infection, perforation, adverse medication reaction leading to cardiac or pulmonary decompensation, or pancreatitis (if ERCP).  The limitation of incomplete mucosal visualization was also discussed.  No guarantees or warranties were given.   Thank you for the courtesy of this consult.  Please call me with any questions or concerns.  Nelida Meuse III  CC: Cory Infante, MD

## 2018-03-29 NOTE — Patient Instructions (Signed)
If you are age 73 or older, your body mass index should be between 23-30. Your Body mass index is 27.99 kg/m. If this is out of the aforementioned range listed, please consider follow up with your Primary Care Provider.  If you are age 49 or younger, your body mass index should be between 19-25. Your Body mass index is 27.99 kg/m. If this is out of the aformentioned range listed, please consider follow up with your Primary Care Provider.   You have been scheduled for a colonoscopy. Please follow written instructions given to you at your visit today.  Please pick up your prep supplies at the pharmacy within the next 1-3 days. If you use inhalers (even only as needed), please bring them with you on the day of your procedure. Your physician has requested that you go to www.startemmi.com and enter the access code given to you at your visit today. This web site gives a general overview about your procedure. However, you should still follow specific instructions given to you by our office regarding your preparation for the procedure.  It was a pleasure to meet you today!  Dr. Loletha Carrow

## 2018-04-22 DIAGNOSIS — H1851 Endothelial corneal dystrophy: Secondary | ICD-10-CM | POA: Diagnosis not present

## 2018-04-22 DIAGNOSIS — H2513 Age-related nuclear cataract, bilateral: Secondary | ICD-10-CM | POA: Diagnosis not present

## 2018-05-10 ENCOUNTER — Ambulatory Visit (AMBULATORY_SURGERY_CENTER): Payer: Medicare Other | Admitting: Gastroenterology

## 2018-05-10 ENCOUNTER — Encounter: Payer: Self-pay | Admitting: Gastroenterology

## 2018-05-10 VITALS — BP 124/74 | HR 62 | Temp 99.5°F | Resp 11 | Ht 74.0 in | Wt 218.0 lb

## 2018-05-10 DIAGNOSIS — K5909 Other constipation: Secondary | ICD-10-CM

## 2018-05-10 DIAGNOSIS — R1032 Left lower quadrant pain: Secondary | ICD-10-CM | POA: Diagnosis not present

## 2018-05-10 MED ORDER — SODIUM CHLORIDE 0.9 % IV SOLN: 500.0000 mL | Freq: Once | INTRAVENOUS | Status: DC

## 2018-05-10 NOTE — Progress Notes (Signed)
Pt's states no medical or surgical changes since previsit or office visit. 

## 2018-05-10 NOTE — Patient Instructions (Signed)
YOU HAD AN ENDOSCOPIC PROCEDURE TODAY AT [facility]:   Refer to the procedure report that was given to you for any specific questions about what was found during the examination.  If the procedure report does not answer your questions, please call your gastroenterologist to clarify.  If you requested that your care partner not be given the details of your procedure findings, then the procedure report has been included in a sealed envelope for you to review at your convenience later.  YOU SHOULD EXPECT: Some feelings of bloating in the abdomen. Passage of more gas than usual.  Walking can help get rid of the air that was put into your GI tract during the procedure and reduce the bloating. If you had a lower endoscopy (such as a colonoscopy or flexible sigmoidoscopy) you may notice spotting of blood in your stool or on the toilet paper. If you underwent a bowel prep for your procedure, you may not have a normal bowel movement for a few days.  Please Note:  You might notice some irritation and congestion in your nose or some drainage.  This is from the oxygen used during your procedure.  There is no need for concern and it should clear up in a day or so.  SYMPTOMS TO REPORT IMMEDIATELY:   Following lower endoscopy (colonoscopy or flexible sigmoidoscopy):  Excessive amounts of blood in the stool  Significant tenderness or worsening of abdominal pains  Swelling of the abdomen that is new, acute  Fever of 100F or higher  For urgent or emergent issues, a gastroenterologist can be reached at any hour by calling [phone].   DIET:  We do recommend a small meal at first, but then you may proceed to your regular diet.  Drink plenty of fluids but you should avoid alcoholic beverages for 24 hours  MEDICATIONS: Continue present medications. Mirilax 1 capful (17 grams) in 8 ounces of water daily.  Please see handouts given to you by your recovery nurse.  ACTIVITY:  You should plan to  take it easy for the rest of today and you should NOT DRIVE or use heavy machinery until tomorrow (because of the sedation medicines used during the test).    FOLLOW UP: Our staff will call the number listed on your records the next business day following your procedure to check on you and address any questions or concerns that you may have regarding the information given to you following your procedure. If we do not reach you, we will leave a message.  However, if you are feeling well and you are not experiencing any problems, there is no need to return our call.  We will assume that you have returned to your regular daily activities without incident.  If any biopsies were taken you will be contacted by phone or by letter within the next 1-3 weeks.  Please call us at [phone] if you have not heard about the biopsies in 3 weeks.   Thank you for allowing [place] to provide for your healthcare needs today.  SIGNATURES/CONFIDENTIALITY: You and/or your care partner have signed paperwork which will be entered into your electronic medical record.  These signatures attest to the fact that that the information above on your After Visit Summary has been reviewed and is understood.  Full responsibility of the confidentiality of this discharge information lies with you and/or your care-partner.

## 2018-05-10 NOTE — Progress Notes (Signed)
A/ox3 pleased with MAC, report to RN 

## 2018-05-10 NOTE — Op Note (Signed)
[facility] Patient Name: Cory Barnett Procedure Date: 05/10/2018 3:54 PM MRN: [id number] Endoscopist: Mallie Mussel L. Loletha Carrow , MD Age: 72 Referring MD:  Date of Birth: Oct 14, 1946 Gender: Male Account #: [id number] Procedure:                Colonoscopy Indications:              Abdominal pain in the left lower quadrant,                            Constipation Medicines:                Monitored Anesthesia Care Procedure:                Pre-Anesthesia Assessment:                           - Prior to the procedure, a History and Physical                            was performed, and patient medications and                            allergies were reviewed. The patient's tolerance of                            previous anesthesia was also reviewed. The risks                            and benefits of the procedure and the sedation                            options and risks were discussed with the patient.                            All questions were answered, and informed consent                            was obtained. Prior Anticoagulants: The patient has                            taken no previous anticoagulant or antiplatelet                            agents. ASA Grade Assessment: II - A patient with                            mild systemic disease. After reviewing the risks                            and benefits, the patient was deemed in                            satisfactory condition to undergo the procedure.  After obtaining informed consent, the colonoscope                            was passed under direct vision. Throughout the                            procedure, the patient's blood pressure, pulse, and                            oxygen saturations were monitored continuously. The                            Colonoscope was introduced through the anus and                            advanced to the the cecum, identified by          appendiceal orifice and ileocecal valve. The                            colonoscopy was performed without difficulty. The                            patient tolerated the procedure well. The quality                            of the bowel preparation was fair. The ileocecal                            valve, appendiceal orifice, and rectum were                            photographed. Scope In: 4:04:19 PM Scope Out: 4:20:10 PM Scope Withdrawal Time: 0 hours 10 minutes 23 seconds  Total Procedure Duration: 0 hours 15 minutes 51 seconds  Findings:                 The perianal and digital rectal examinations were                            normal.                           Many large-mouthed diverticula were found in the                            entire colon.                           The left colon was significantly tortuous.                           The exam was otherwise without abnormality on                            direct and retroflexion views. Complications:  No immediate complications. Estimated Blood Loss:     Estimated blood loss: none. Impression:               - Preparation of the colon was fair.                           - Diverticulosis in the entire examined colon.                           - Tortuous colon.                           - The examination was otherwise normal on direct                            and retroflexion views.                           - No specimens collected. Recommendation:           - Patient has a contact number available for                            emergencies. The signs and symptoms of potential                            delayed complications were discussed with the                            patient. Return to normal activities tomorrow.                            Written discharge instructions were provided to the                            patient.                           - Resume previous diet.                            - Continue present medications.                           - Miralax 1 capful (17 grams) in 8 ounces of water                            PO daily.                           - No future routine screening colonoscopy due to                            age.  L. Loletha Carrow, MD 05/10/2018 4:28:21 PM This report has been signed electronically.

## 2018-05-11 ENCOUNTER — Telehealth: Payer: Self-pay | Admitting: *Deleted

## 2018-05-11 NOTE — Telephone Encounter (Signed)
  Follow up Call-  Call back number 05/10/2018  Post procedure Call Back phone  # [phone]  Permission to leave phone message Yes  Some recent data might be hidden     Patient questions:  Do you have a fever, pain , or abdominal swelling? No. Pain Score  0 *  Have you tolerated food without any problems? Yes.    Have you been able to return to your normal activities? Yes.    Do you have any questions about your discharge instructions: Diet   No. Medications  No. Follow up visit  No.  Do you have questions or concerns about your Care? No.  Actions: * If pain score is 4 or above: No action needed, pain <4.

## 2018-05-11 NOTE — Telephone Encounter (Signed)
No answer, left message to call if questions or concerns. 

## 2018-05-31 DIAGNOSIS — H2511 Age-related nuclear cataract, right eye: Secondary | ICD-10-CM | POA: Diagnosis not present

## 2018-05-31 DIAGNOSIS — H25011 Cortical age-related cataract, right eye: Secondary | ICD-10-CM | POA: Diagnosis not present

## 2018-05-31 DIAGNOSIS — H25811 Combined forms of age-related cataract, right eye: Secondary | ICD-10-CM | POA: Diagnosis not present

## 2018-06-03 DIAGNOSIS — L739 Follicular disorder, unspecified: Secondary | ICD-10-CM | POA: Diagnosis not present

## 2018-06-03 DIAGNOSIS — L723 Sebaceous cyst: Secondary | ICD-10-CM | POA: Diagnosis not present

## 2018-06-03 DIAGNOSIS — D485 Neoplasm of uncertain behavior of skin: Secondary | ICD-10-CM | POA: Diagnosis not present

## 2018-06-03 DIAGNOSIS — D0439 Carcinoma in situ of skin of other parts of face: Secondary | ICD-10-CM | POA: Diagnosis not present

## 2018-06-03 DIAGNOSIS — Z85828 Personal history of other malignant neoplasm of skin: Secondary | ICD-10-CM | POA: Diagnosis not present

## 2018-06-03 DIAGNOSIS — L821 Other seborrheic keratosis: Secondary | ICD-10-CM | POA: Diagnosis not present

## 2018-06-17 DIAGNOSIS — D0430 Carcinoma in situ of skin of unspecified part of face: Secondary | ICD-10-CM | POA: Diagnosis not present

## 2018-07-07 DIAGNOSIS — H2512 Age-related nuclear cataract, left eye: Secondary | ICD-10-CM | POA: Diagnosis not present

## 2018-10-15 DIAGNOSIS — J069 Acute upper respiratory infection, unspecified: Secondary | ICD-10-CM | POA: Diagnosis not present

## 2018-10-15 DIAGNOSIS — J209 Acute bronchitis, unspecified: Secondary | ICD-10-CM | POA: Diagnosis not present

## 2018-11-04 DIAGNOSIS — Z23 Encounter for immunization: Secondary | ICD-10-CM | POA: Diagnosis not present

## 2018-11-26 ENCOUNTER — Encounter: Payer: Self-pay | Admitting: Family Medicine

## 2018-11-26 ENCOUNTER — Ambulatory Visit: Payer: Self-pay | Admitting: Family Medicine

## 2018-11-26 VITALS — BP 130/62 | HR 70 | Temp 98.6°F | Wt 221.2 lb

## 2018-11-26 DIAGNOSIS — J309 Allergic rhinitis, unspecified: Secondary | ICD-10-CM

## 2018-11-26 DIAGNOSIS — H6690 Otitis media, unspecified, unspecified ear: Secondary | ICD-10-CM

## 2018-11-26 MED ORDER — AZELASTINE HCL 0.1 % NA SOLN: 1.0000 | Freq: Two times a day (BID) | NASAL | 0 refills | Status: DC

## 2018-11-26 MED ORDER — AMOXICILLIN-POT CLAVULANATE 875-125 MG PO TABS
1.0000 | ORAL_TABLET | Freq: Two times a day (BID) | ORAL | 0 refills | Status: DC
Start: 2018-11-26 — End: 2019-03-13

## 2018-11-26 NOTE — Progress Notes (Signed)
Cory Barnett is a 73 y.o. male who presents today with 3 days of  ear pain he has been treating his sinus cough and congestion attempted treatments including supportive care for like symptoms approximately 30 days ago. He is concerned because his wife has cancer and is pending her first chemo treatment on Feb 11 and he is to be the primary caregiver. He has hx of symptoms untreated in last 30 days, hx of untreated seasonal allergies, denies smoking or known exposure to like illness risk.  Review of Systems  Constitutional: Positive for chills. Negative for fever and malaise/fatigue.  HENT: Positive for congestion, sinus pain and sore throat. Negative for ear discharge and ear pain.   Eyes: Negative.   Respiratory: Positive for cough. Negative for sputum production and shortness of breath.   Cardiovascular: Negative.  Negative for chest pain.  Gastrointestinal: Negative for abdominal pain, diarrhea, nausea and vomiting.  Genitourinary: Negative for dysuria, frequency, hematuria and urgency.  Musculoskeletal: Negative for myalgias.  Skin: Negative.   Neurological: Negative for headaches.  Endo/Heme/Allergies: Negative.   Psychiatric/Behavioral: Negative.     Cory Barnett has a current medication list which includes the following prescription(s): AMBULATORY NON FORMULARY MEDICATION, cholecalciferol, escitalopram, loratadine, losartan, multi-vitamins, omega-3 fatty acids, rosuvastatin, saw palmetto, tadalafil, amoxicillin-clavulanate, and azelastine, and the following Facility-Administered Medications: sodium chloride. Also is allergic to moxifloxacin.  Cory Barnett  has a past medical history of Anxiety, AV block, 1st degree, Basal cell carcinoma, Cardiac murmur, Cataract, Elevated PSA measurement (2018), Hyperlipidemia, Hypertension (2015), Hypogonadism male, IFG (impaired fasting glucose) (01/2016), RBBB (right bundle branch block), Rheumatic fever, Seasonal allergies, and Squamous cell carcinoma. Also  has a  past surgical history that includes Tonsillectomy; Nose surgery; Skin cancer excision; Vasectomy; Nasal septum surgery; and Wisdom tooth extraction.    O: Vitals:   11/26/18 1106  BP: 130/62  Pulse: 70  Temp: 98.6 F (37 C)  SpO2: 95%     Physical Exam Vitals signs reviewed.  Constitutional:      General: He is not in acute distress.    Appearance: He is well-developed. He is not ill-appearing, toxic-appearing or diaphoretic.  HENT:     Head: Normocephalic.     Right Ear: Hearing, tympanic membrane, ear canal and external ear normal.     Left Ear: Hearing, ear canal and external ear normal. Tympanic membrane is injected and erythematous.     Nose: Congestion and rhinorrhea present.     Right Sinus: Maxillary sinus tenderness present. No frontal sinus tenderness.     Left Sinus: Maxillary sinus tenderness present. No frontal sinus tenderness.     Mouth/Throat:     Pharynx: Uvula midline. Posterior oropharyngeal erythema present. No pharyngeal swelling, oropharyngeal exudate or uvula swelling.     Tonsils: No tonsillar exudate or tonsillar abscesses. Swelling: 0 on the right. 0 on the left.  Neck:     Musculoskeletal: Normal range of motion and neck supple.  Cardiovascular:     Rate and Rhythm: Normal rate and regular rhythm.     Pulses: Normal pulses.     Heart sounds: Normal heart sounds.  Pulmonary:     Effort: Pulmonary effort is normal.     Breath sounds: Normal breath sounds.  Abdominal:     General: Bowel sounds are normal.     Palpations: Abdomen is soft.  Musculoskeletal: Normal range of motion.  Lymphadenopathy:     Head:     Right side of head: No submental, submandibular or tonsillar adenopathy.  Left side of head: Tonsillar adenopathy present. No submental or submandibular adenopathy.     Cervical: No cervical adenopathy.  Neurological:     Mental Status: He is alert and oriented to person, place, and time.    A: 1. Acute otitis media, unspecified  otitis media type   2. Allergic rhinitis, unspecified seasonality, unspecified trigger    P: 1. Acute otitis media, unspecified otitis media type Evidence of otitis infection will treat for 10 days this will also cover for sinusitis.  - amoxicillin-clavulanate (AUGMENTIN) 875-125 MG tablet; Take 1 tablet by mouth 2 (two) times daily.  2. Allergic rhinitis, unspecified seasonality, unspecified trigger Advised to use seasonal allergy medication more regularly. - azelastine (ASTELIN) 0.1 % nasal spray; Place 1 spray into both nostrils 2 (two) times daily. Use in each nostril as directed  Other orders - Loratadine (CLARITIN) 10 MG CAPS; Take by mouth.   Discussed with patient exam findings, suspected diagnosis etiology and  reviewed recommended treatment plan and follow up, including complications and indications for urgent medical follow up and evaluation. Medications including use and indications reviewed with patient. Patient provided relevant patient education on diagnosis and/or relevant related condition that were discussed and reviewed with patient at discharge. Patient verbalized understanding of information provided and agrees with plan of care (POC), all questions answered.

## 2018-11-26 NOTE — Patient Instructions (Signed)
PLAN< Oral antibiotic x 10 days Supportive and Symptomatic treatment to decrease congestion symptoms Restart and continue seasonal allergy medications   Azelastine nasal spray What is this medicine? AZELASTINE (a ZEL as teen) nasal spray is a histamine blocker. It helps to relieve itching, running and stuffiness in the nose. This medicine is used to treat nasal symptoms from allergies and other irritants. This medicine may be used for other purposes; ask your health care provider or pharmacist if you have questions. COMMON BRAND NAME(S): Astelin, Astepro What should I tell my health care provider before I take this medicine? They need to know if you have any of these conditions: -any other medical problems -an unusual or allergic reaction to azelastine, other nasal sprays, medicines, foods, dyes, or preservatives -pregnant or trying to become pregnant -breast-feeding How should I use this medicine? This medicine is for use only in the nose. Follow the directions on the prescription label. Do not use more often than directed. Make sure that you are using your inhaler correctly. Ask you doctor or health care provider if you have any questions. Talk to your pediatrician regarding the use of this medicine in children. While some products may be prescribed for children as young as 6 months for selected conditions, precautions do apply. Overdosage: If you think you have taken too much of this medicine contact a poison control center or emergency room at once. NOTE: This medicine is only for you. Do not share this medicine with others. What if I miss a dose? If you miss a dose, use it as soon as you can. If it is almost time for your next dose, use only that dose. Do not use double or extra doses. What may interact with this medicine? -cimetidine -other antihistamines This list may not describe all possible interactions. Give your health care provider a list of all the medicines, herbs,  non-prescription drugs, or dietary supplements you use. Also tell them if you smoke, drink alcohol, or use illegal drugs. Some items may interact with your medicine. What should I watch for while using this medicine? Tell your doctor or health care professional if your symptoms do not improve. Do not use extra medicine. Do not spray in your eyes. This medicine may make you feel confused, dizzy or lightheaded. Drinking alcohol or taking medicine that causes drowsiness can make this worse. Do not drive, use machinery, or do anything that needs mental alertness until you know how this medicine affects you. What side effects may I notice from receiving this medicine? Side effects that you should report to your doctor or health care professional as soon as possible: -allergic reactions like skin rash, itching or hives, swelling of the face, lips, or tongue -breathing problems -fast heartbeat -high blood pressure -infection Side effects that usually do not require medical attention (report to your doctor or health care professional if they continue or are bothersome): -bitter taste -cough -feeling tired -headache -larger appetite or weight gain -nose or throat irritation -nosebleed -sneezing This list may not describe all possible side effects. Call your doctor for medical advice about side effects. You may report side effects to FDA at 1-800-FDA-1088. Where should I keep my medicine? Keep out of the reach of children. Store upright and tightly closed at room temperature between 20 and 25 degrees C (68 and 77 degrees F). Do not freeze. Throw away any unused medicine after the expiration date or after 200 sprays, whichever comes first. NOTE: This sheet is a summary. It may  not cover all possible information. If you have questions about this medicine, talk to your doctor, pharmacist, or health care provider.  2019 Elsevier/Gold Standard (2013-12-26 15:52:44) Otitis Media, Adult  Otitis media  occurs when there is inflammation and fluid in the middle ear. Your middle ear is a part of the ear that contains bones for hearing as well as air that helps send sounds to your brain. What are the causes? This condition is caused by a blockage in the eustachian tube. This tube drains fluid from the ear to the back of the nose (nasopharynx). A blockage in this tube can be caused by an object or by swelling (edema) in the tube. Problems that can cause a blockage include:  A cold or other upper respiratory infection.  Allergies.  An irritant, such as tobacco smoke.  Enlarged adenoids. The adenoids are areas of soft tissue located high in the back of the throat, behind the nose and the roof of the mouth.  A mass in the nasopharynx.  Damage to the ear caused by pressure changes (barotrauma). What are the signs or symptoms? Symptoms of this condition include:  Ear pain.  A fever.  Decreased hearing.  A headache.  Tiredness (lethargy).  Fluid leaking from the ear.  Ringing in the ear. How is this diagnosed? This condition is diagnosed with a physical exam. During the exam your health care provider will use an instrument called an otoscope to look into your ear and check for redness, swelling, and fluid. He or she will also ask about your symptoms. Your health care provider may also order tests, such as:  A test to check the movement of the eardrum (pneumatic otoscopy). This test is done by squeezing a small amount of air into the ear.  A test that changes air pressure in the middle ear to check how well the eardrum moves and whether the eustachian tube is working (tympanogram). How is this treated? This condition usually goes away on its own within 3-5 days. But if the condition is caused by a bacteria infection and does not go away own its own, or keeps coming back, your health care provider may:  Prescribe antibiotic medicines to treat the infection.  Prescribe or recommend  medicines to control pain. Follow these instructions at home:  Take over-the-counter and prescription medicines only as told by your health care provider.  If you were prescribed an antibiotic medicine, take it as told by your health care provider. Do not stop taking the antibiotic even if you start to feel better.  Keep all follow-up visits as told by your health care provider. This is important. Contact a health care provider if:  You have bleeding from your nose.  There is a lump on your neck.  You are not getting better in 5 days.  You feel worse instead of better. Get help right away if:  You have severe pain that is not controlled with medicine.  You have swelling, redness, or pain around your ear.  You have stiffness in your neck.  A part of your face is paralyzed.  The bone behind your ear (mastoid) is tender when you touch it.  You develop a severe headache. Summary  Otitis media is redness, soreness, and swelling of the middle ear.  This condition usually goes away on its own within 3-5 days.  If the problem does not go away in 3-5 days, your health care provider may prescribe or recommend medicines to treat your symptoms.  If you were prescribed an antibiotic medicine, take it as told by your health care provider. This information is not intended to replace advice given to you by your health care provider. Make sure you discuss any questions you have with your health care provider. Document Released: 07/24/2004 Document Revised: 10/09/2016 Document Reviewed: 10/09/2016 Elsevier Interactive Patient Education  2019 [employer].

## 2018-12-01 DIAGNOSIS — L570 Actinic keratosis: Secondary | ICD-10-CM | POA: Diagnosis not present

## 2018-12-01 DIAGNOSIS — Z85828 Personal history of other malignant neoplasm of skin: Secondary | ICD-10-CM | POA: Diagnosis not present

## 2018-12-01 DIAGNOSIS — L820 Inflamed seborrheic keratosis: Secondary | ICD-10-CM | POA: Diagnosis not present

## 2018-12-01 DIAGNOSIS — L821 Other seborrheic keratosis: Secondary | ICD-10-CM | POA: Diagnosis not present

## 2018-12-01 DIAGNOSIS — Z23 Encounter for immunization: Secondary | ICD-10-CM | POA: Diagnosis not present

## 2019-01-20 DIAGNOSIS — F419 Anxiety disorder, unspecified: Secondary | ICD-10-CM | POA: Diagnosis not present

## 2019-03-06 ENCOUNTER — Telehealth: Payer: Self-pay | Admitting: Cardiology

## 2019-03-06 NOTE — Telephone Encounter (Signed)
Spoke with patient - he would like to virtual do visit  --  Patient would like to use his computer instead of smartphone for a larger screen.   use  [phone]( smartphone)  to contact patient  and then send message to e mail(  [email or web address])

## 2019-03-06 NOTE — Telephone Encounter (Signed)
New message  Patient has appointment 5/11 please call to setup virtual appointment.

## 2019-03-10 ENCOUNTER — Telehealth: Payer: Self-pay | Admitting: Cardiology

## 2019-03-10 NOTE — Progress Notes (Signed)
Virtual Visit via Video Note   This visit type was conducted due to national recommendations for restrictions regarding the COVID-19 Pandemic (e.g. social distancing) in an effort to limit this patient's exposure and mitigate transmission in our community.  Due to his co-morbid illnesses, this patient is at least at moderate risk for complications without adequate follow up.  This format is felt to be most appropriate for this patient at this time.  All issues noted in this document were discussed and addressed.  A limited physical exam was performed with this format.  Please refer to the patient's chart for his consent to telehealth for [facility].   Date:  03/13/2019   ID:  Cory Barnett, DOB Apr 16, 1946, MRN [id number]  Patient Location: Home Provider Location: Home  PCP:  Crist Infante, MD  Cardiologist: Dr Stanford Breed  Evaluation Performed:  Follow-Up Visit  Chief Complaint:  FU Palpitations  History of Present Illness:    FUpalpitations.  Nuclear study June 2009 showed ejection fraction 66% and no ischemia or infarction.  Echo 8/17 showed normal LV function, mild LAE and trace AI. Abd ultrasound 8/17 showed no AAA. Previous descriptionof palpitationsfelt c/w PACs or PVCs. Since last seen, the patient denies any dyspnea on exertion, orthopnea, PND, pedal edema, palpitations, syncope or chest pain.    The patient does not have symptoms concerning for COVID-19 infection (fever, chills, cough, or new shortness of breath).    Past Medical History:  Diagnosis Date  . Anxiety   . AV block, 1st degree   . Basal cell carcinoma   . Cardiac murmur   . Cataract   . Elevated PSA measurement 2018  . Hyperlipidemia   . Hypertension 2015  . 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 IFG (impaired fasting glucose) 01/2016  . RBBB (right bundle branch block)   . Rheumatic fever    age 24 or 57, in hospital for a month, out of school for a yr  . Seasonal allergies   . Squamous cell carcinoma     Past Surgical History:  Procedure Laterality Date  . NASAL SEPTUM SURGERY    . NOSE SURGERY    . SKIN CANCER EXCISION     basal and squamous cell   . TONSILLECTOMY    . VASECTOMY    . WISDOM TOOTH EXTRACTION       Current Meds  Medication Sig  . AMBULATORY NON FORMULARY MEDICATION Medication Name: Colon-Plus Caps  Takes 1-2 capsules by mouth daily  . azelastine (ASTELIN) 0.1 % nasal spray Place 1 spray into both nostrils 2 (two) times daily. Use in each nostril as directed (Patient taking differently: Place 1 spray into both nostrils as needed. Use in each nostril as directed)  . Cholecalciferol (VITAMIN D-1000 MAX ST) 1000 units tablet Take 2,000 mg by mouth daily.   . Coenzyme Q10 (CO Q 10) 100 MG CAPS Take 100 mg by mouth every morning.  . escitalopram (LEXAPRO) 5 MG tablet Take 10 mg by mouth daily.   . Loratadine (CLARITIN) 10 MG CAPS Take 10 mg by mouth as needed.   Marland Kitchen losartan (COZAAR) 50 MG tablet Take 1 tablet (50 mg total) by mouth daily.  . Multiple Vitamin (MULTI-VITAMINS) TABS Take 1 tablet by mouth daily.  . Omega-3 Fatty Acids (FISH OIL PO) Take 2 g by mouth daily.  . Probiotic Product (PROBIOTIC ADVANCED PO) Take 1 tablet by mouth daily with breakfast.  . rosuvastatin (CRESTOR) 10 MG tablet Take 10 mg  by mouth daily.  . Saw Palmetto 160 MG CAPS Take 1,500 mg by mouth daily.  . tadalafil (CIALIS) 10 MG tablet Take 1 tablet (10 mg total) by mouth daily as needed for erectile dysfunction.  . [DISCONTINUED] amoxicillin-clavulanate (AUGMENTIN) 875-125 MG tablet Take 1 tablet by mouth 2 (two) times daily.   Current Facility-Administered Medications for the 03/13/19 encounter (Telemedicine) with Lelon Perla, MD  Medication  . 0.9 %  sodium chloride infusion     Allergies:   Moxifloxacin   Social History   Tobacco Use  . Smoking status: Former Research scientist (life sciences)  . Smokeless tobacco: Never Used  Substance Use Topics  . Alcohol use: Yes    Alcohol/week: 2.0 standard drinks     Types: 2 Cans of beer per week  . Drug use: Never     Family Hx: The patient's family history includes Arthritis in his father, mother, sister, and sister; Bipolar disorder in his sister; Cancer in his mother; Dementia in his mother; Mesothelioma in his father; Stroke in his mother.  ROS:   Please see the history of present illness.    No fevers, chills or productive cough.  Some fatigue. All other systems reviewed and are negative.   Wt Readings from Last 3 Encounters:  03/13/19 218 lb (98.9 kg)  11/26/18 221 lb 3.2 oz (100.3 kg)  05/10/18 218 lb (98.9 kg)     Objective:    Vital Signs:  BP (!) 141/91 Comment: Second reading for today.  Pulse (!) 48 Comment: Patient's wife said that patient's pulse felt irregular.  Ht 6\' 2"  (1.88 m)   Wt 218 lb (98.9 kg)   BMI 27.99 kg/m    VITAL SIGNS:  reviewed  No acute distress Answers questions appropriately Normal affect Remainder of physical examination not performed (telehealth visit; coronavirus pandemic)  ASSESSMENT & PLAN:    1. Palpitations-patient doing well from a symptomatic standpoint.  This has previously been felt to be secondary to PVCs or PACs.  We will pursue a monitor in the future if symptoms worsen. 2. Hypertension-blood pressure is elevated; increase losartan to 100 mg daily and follow.  He is scheduled to have full blood work done on May 20.  We will ask for that to be forwarded to [place]. 3. Hyperlipidemia-continue statin.  Lipids and liver monitored by primary care.  COVID-19 Education: The importance of social distancing was discussed today.  Time:   Today, I have spent 10 minutes with the patient with telehealth technology discussing the above problems.     Medication Adjustments/Labs and Tests Ordered: Current medicines are reviewed at length with the patient today.  Concerns regarding medicines are outlined above.   Tests Ordered: No orders of the defined types were placed in this encounter.    Medication Changes: No orders of the defined types were placed in this encounter.   Disposition:  Follow up in 6 month(s)  Signed, Kirk Ruths, MD  03/13/2019 8:08 AM    [place]

## 2019-03-10 NOTE — Telephone Encounter (Signed)
Smartphone/ my chart via email/ consent/ pre reg completed °

## 2019-03-13 ENCOUNTER — Telehealth (INDEPENDENT_AMBULATORY_CARE_PROVIDER_SITE_OTHER): Payer: Medicare Other | Admitting: Cardiology

## 2019-03-13 VITALS — BP 141/91 | HR 48 | Ht 74.0 in | Wt 218.0 lb

## 2019-03-13 DIAGNOSIS — E785 Hyperlipidemia, unspecified: Secondary | ICD-10-CM

## 2019-03-13 DIAGNOSIS — I10 Essential (primary) hypertension: Secondary | ICD-10-CM

## 2019-03-13 DIAGNOSIS — R002 Palpitations: Secondary | ICD-10-CM | POA: Diagnosis not present

## 2019-03-13 DIAGNOSIS — E7800 Pure hypercholesterolemia, unspecified: Secondary | ICD-10-CM

## 2019-03-13 MED ORDER — LOSARTAN POTASSIUM 100 MG PO TABS: 100.0000 mg | ORAL_TABLET | Freq: Every day | ORAL | 3 refills | Status: DC

## 2019-03-13 NOTE — Patient Instructions (Signed)
Medication Instructions:  INCREASE LOSARTAN TO 100 MG ONCE DAILY= 2 OF THE 50 MG TABLETS ONCE DAILY If you need a refill on your cardiac medications before your next appointment, please call your pharmacy.   Lab work: [place] TO FORWARD LAB WORK TO [place] If you have labs (blood work) drawn today and your tests are completely normal, you will receive your results only by: Marland Kitchen MyChart Message (if you have MyChart) OR . A paper copy in the mail If you have any lab test that is abnormal or we need to change your treatment, we will call you to review the results.  Follow-Up: At [facility], you and your health needs are our priority.  As part of our continuing mission to provide you with exceptional heart care, we have created designated Provider Care Teams.  These Care Teams include your primary Cardiologist (physician) and Advanced Practice Providers (APPs -  Physician Assistants and Nurse Practitioners) who all work together to provide you with the care you need, when you need it. You will need a follow up appointment in 6 months.  Please call our office 2 months in advance to schedule this appointment.  You may see Kirk Ruths MD or one of the following Advanced Practice Providers on your designated Care Team:   Kerin Ransom, PA-C Roby Lofts, [place] . Sande Rives, PA-C

## 2019-03-22 DIAGNOSIS — E7849 Other hyperlipidemia: Secondary | ICD-10-CM | POA: Diagnosis not present

## 2019-03-22 DIAGNOSIS — Z125 Encounter for screening for malignant neoplasm of prostate: Secondary | ICD-10-CM | POA: Diagnosis not present

## 2019-03-22 DIAGNOSIS — R7301 Impaired fasting glucose: Secondary | ICD-10-CM | POA: Diagnosis not present

## 2019-03-22 DIAGNOSIS — I10 Essential (primary) hypertension: Secondary | ICD-10-CM | POA: Diagnosis not present

## 2019-03-30 DIAGNOSIS — R82998 Other abnormal findings in urine: Secondary | ICD-10-CM | POA: Diagnosis not present

## 2019-03-30 DIAGNOSIS — I10 Essential (primary) hypertension: Secondary | ICD-10-CM | POA: Diagnosis not present

## 2019-04-06 DIAGNOSIS — R002 Palpitations: Secondary | ICD-10-CM | POA: Diagnosis not present

## 2019-04-06 DIAGNOSIS — F419 Anxiety disorder, unspecified: Secondary | ICD-10-CM | POA: Diagnosis not present

## 2019-04-06 DIAGNOSIS — R972 Elevated prostate specific antigen [PSA]: Secondary | ICD-10-CM | POA: Diagnosis not present

## 2019-04-06 DIAGNOSIS — I4510 Unspecified right bundle-branch block: Secondary | ICD-10-CM | POA: Diagnosis not present

## 2019-04-06 DIAGNOSIS — J309 Allergic rhinitis, unspecified: Secondary | ICD-10-CM | POA: Diagnosis not present

## 2019-04-06 DIAGNOSIS — N401 Enlarged prostate with lower urinary tract symptoms: Secondary | ICD-10-CM | POA: Diagnosis not present

## 2019-04-06 DIAGNOSIS — I10 Essential (primary) hypertension: Secondary | ICD-10-CM | POA: Diagnosis not present

## 2019-04-06 DIAGNOSIS — Z0000 Encounter for general adult medical examination without abnormal findings: Secondary | ICD-10-CM | POA: Diagnosis not present

## 2019-04-06 DIAGNOSIS — M13869 Other specified arthritis, unspecified knee: Secondary | ICD-10-CM | POA: Diagnosis not present

## 2019-04-06 DIAGNOSIS — Z1331 Encounter for screening for depression: Secondary | ICD-10-CM | POA: Diagnosis not present

## 2019-04-06 DIAGNOSIS — E291 Testicular hypofunction: Secondary | ICD-10-CM | POA: Diagnosis not present

## 2019-04-06 DIAGNOSIS — I440 Atrioventricular block, first degree: Secondary | ICD-10-CM | POA: Diagnosis not present

## 2019-04-06 DIAGNOSIS — R7301 Impaired fasting glucose: Secondary | ICD-10-CM | POA: Diagnosis not present

## 2019-04-27 DIAGNOSIS — Z85828 Personal history of other malignant neoplasm of skin: Secondary | ICD-10-CM | POA: Diagnosis not present

## 2019-04-27 DIAGNOSIS — L821 Other seborrheic keratosis: Secondary | ICD-10-CM | POA: Diagnosis not present

## 2019-04-27 DIAGNOSIS — D485 Neoplasm of uncertain behavior of skin: Secondary | ICD-10-CM | POA: Diagnosis not present

## 2019-04-27 DIAGNOSIS — C4442 Squamous cell carcinoma of skin of scalp and neck: Secondary | ICD-10-CM | POA: Diagnosis not present

## 2019-04-27 DIAGNOSIS — D225 Melanocytic nevi of trunk: Secondary | ICD-10-CM | POA: Diagnosis not present

## 2019-04-27 DIAGNOSIS — L814 Other melanin hyperpigmentation: Secondary | ICD-10-CM | POA: Diagnosis not present

## 2019-04-27 DIAGNOSIS — L570 Actinic keratosis: Secondary | ICD-10-CM | POA: Diagnosis not present

## 2019-05-04 DIAGNOSIS — H5213 Myopia, bilateral: Secondary | ICD-10-CM | POA: Diagnosis not present

## 2019-05-04 DIAGNOSIS — H1851 Endothelial corneal dystrophy: Secondary | ICD-10-CM | POA: Diagnosis not present

## 2019-05-04 DIAGNOSIS — H52203 Unspecified astigmatism, bilateral: Secondary | ICD-10-CM | POA: Diagnosis not present

## 2019-05-15 ENCOUNTER — Ambulatory Visit (INDEPENDENT_AMBULATORY_CARE_PROVIDER_SITE_OTHER): Payer: Medicare Other | Admitting: Family Medicine

## 2019-05-15 ENCOUNTER — Ambulatory Visit (INDEPENDENT_AMBULATORY_CARE_PROVIDER_SITE_OTHER)
Admission: RE | Admit: 2019-05-15 | Discharge: 2019-05-15 | Disposition: A | Discharge status: Home / Self Care | Payer: Medicare Other | Source: Ambulatory Visit | Attending: Family Medicine | Admitting: Family Medicine

## 2019-05-15 ENCOUNTER — Other Ambulatory Visit: Payer: Self-pay

## 2019-05-15 ENCOUNTER — Encounter: Payer: Self-pay | Admitting: Family Medicine

## 2019-05-15 ENCOUNTER — Ambulatory Visit: Payer: Self-pay

## 2019-05-15 VITALS — BP 132/92 | HR 72 | Ht 74.0 in | Wt 219.0 lb

## 2019-05-15 DIAGNOSIS — M19011 Primary osteoarthritis, right shoulder: Secondary | ICD-10-CM | POA: Diagnosis not present

## 2019-05-15 DIAGNOSIS — M25511 Pain in right shoulder: Secondary | ICD-10-CM

## 2019-05-15 DIAGNOSIS — S42034A Nondisplaced fracture of lateral end of right clavicle, initial encounter for closed fracture: Secondary | ICD-10-CM

## 2019-05-15 DIAGNOSIS — S42033A Displaced fracture of lateral end of unspecified clavicle, initial encounter for closed fracture: Secondary | ICD-10-CM | POA: Insufficient documentation

## 2019-05-15 DIAGNOSIS — M19019 Primary osteoarthritis, unspecified shoulder: Secondary | ICD-10-CM | POA: Insufficient documentation

## 2019-05-15 MED ORDER — VITAMIN D (ERGOCALCIFEROL) 1.25 MG (50000 UNIT) PO CAPS: 50000.0000 [IU] | ORAL_CAPSULE | ORAL | 0 refills | Status: DC

## 2019-05-15 NOTE — Assessment & Plan Note (Signed)
Nondisplaced clavicle fracture.  Patient declined a sling at the moment.  We discussed icing regimen, discussed vitamin D supplementation.  Patient also had what appeared to be a potentially nondisplaced scapular fracture.  We will monitor closely.  Ultrasound of patient's shoulder today shows the patient has significant hypoechoic changes and potentially a rotator cuff tear.  Patient will follow-up with me again in 2 weeks for further evaluation and treatment and then likely start formal physical therapy

## 2019-05-15 NOTE — Patient Instructions (Signed)
Good to see you.  Ice 20 minutes 2 times daily. Usually after activity and before bed. Exercises 3 times a week.  Once weekly vitamin D for 12 weeks.  See me again in 2 weeks

## 2019-05-15 NOTE — Progress Notes (Signed)
[name] [address], [place] [address] Phone: [phone] Subjective:    I'm seeing this patient by the request  of:  Crist Infante, MD   CC: Right shoulder pain  [id number]  Cory Barnett is a 73 y.o. male coming in with complaint of right shoulder pain. Fell on the shoulder one week ago due to tripping over a pallet. Pain anteriorly. Is not able to move arm more than 20 degrees in each direction. Is use Advil and some ice. Denies any tingling in the hand.  Patient states had a pain immediately.  Patient did feel nauseous to his stomach almost immediately as well.  Patient rates the initial pain is 9 out of 10.  Seems to be improving a little swelling at this moment.  Patient states as long as he keeps his elbows close he is able to pick up things and did do some mulch in his yard the other day.     Past Medical History:  Diagnosis Date  . Anxiety   . AV block, 1st degree   . Basal cell carcinoma   . Cardiac murmur   . Cataract   . Elevated PSA measurement 2018  . Hyperlipidemia   . Hypertension 2015  . 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 IFG (impaired fasting glucose) 01/2016  . RBBB (right bundle branch block)   . Rheumatic fever    age 2 or 46, in hospital for a month, out of school for a yr  . Seasonal allergies   . Squamous cell carcinoma    Past Surgical History:  Procedure Laterality Date  . NASAL SEPTUM SURGERY    . NOSE SURGERY    . SKIN CANCER EXCISION     basal and squamous cell   . TONSILLECTOMY    . VASECTOMY    . WISDOM TOOTH EXTRACTION     Social History   Socioeconomic History  . Marital status: Married    Spouse name: Not on file  . Number of children: 3  . Years of education: Not on file  . Highest education level: Not on file  Occupational History    Comment: Retired  Scientific laboratory technician  . Financial resource strain: Not on file  . Food insecurity    Worry: Not on file    Inability: Not on file  .  Transportation needs    Medical: Not on file    Non-medical: Not on file  Tobacco Use  . Smoking status: Former Research scientist (life sciences)  . Smokeless tobacco: Never Used  Substance and Sexual Activity  . Alcohol use: Yes    Alcohol/week: 2.0 standard drinks    Types: 2 Cans of beer per week  . Drug use: Never  . Sexual activity: Not on file  Lifestyle  . Physical activity    Days per week: Not on file    Minutes per session: Not on file  . Stress: Not on file  Relationships  . Social Herbalist on phone: Not on file    Gets together: Not on file    Attends religious service: Not on file    Active member of club or organization: Not on file    Attends meetings of clubs or organizations: Not on file    Relationship status: Not on file  Other Topics Concern  . Not on file  Social History Narrative  . Not on file   Allergies  Allergen Reactions  .  Moxifloxacin Shortness Of Breath   Family History  Problem Relation Age of Onset  . Stroke Mother   . Arthritis Mother   . Cancer Mother        unknown   . Dementia Mother   . Arthritis Father   . Mesothelioma Father   . Arthritis Sister   . Arthritis Sister   . Bipolar disorder Sister       Current Outpatient Medications (Cardiovascular):  .  losartan (COZAAR) 100 MG tablet, Take 1 tablet (100 mg total) by mouth daily. .  rosuvastatin (CRESTOR) 10 MG tablet, Take 10 mg by mouth daily. .  tadalafil (CIALIS) 10 MG tablet, Take 1 tablet (10 mg total) by mouth daily as needed for erectile dysfunction.   Current Outpatient Medications (Respiratory):  .  azelastine (ASTELIN) 0.1 % nasal spray, Place 1 spray into both nostrils 2 (two) times daily. Use in each nostril as directed (Patient taking differently: Place 1 spray into both nostrils as needed. Use in each nostril as directed) .  Loratadine (CLARITIN) 10 MG CAPS, Take 10 mg by mouth as needed.        Current Outpatient Medications (Other):  Marland Kitchen  AMBULATORY NON FORMULARY  MEDICATION, Medication Name: Colon-Plus Caps  Takes 1-2 capsules by mouth daily .  Cholecalciferol (VITAMIN D-1000 MAX ST) 1000 units tablet, Take 2,000 mg by mouth daily.  .  Coenzyme Q10 (CO Q 10) 100 MG CAPS, Take 100 mg by mouth every morning. .  escitalopram (LEXAPRO) 5 MG tablet, Take 10 mg by mouth daily.  .  Multiple Vitamin (MULTI-VITAMINS) TABS, Take 1 tablet by mouth daily. .  Omega-3 Fatty Acids (FISH OIL PO), Take 2 g by mouth daily. .  Probiotic Product (PROBIOTIC ADVANCED PO), Take 1 tablet by mouth daily with breakfast. .  Saw Palmetto 160 MG CAPS, Take 1,500 mg by mouth daily. .  Vitamin D, Ergocalciferol, (DRISDOL) 1.25 MG (50000 UT) CAPS capsule, Take 1 capsule (50,000 Units total) by mouth every 7 (seven) days.  Current Facility-Administered Medications (Other):  .  0.9 %  sodium chloride infusion    Past medical history, social, surgical and family history all reviewed in electronic medical record.  No pertanent information unless stated regarding to the chief complaint.   Review of Systems:  No headache, visual changes, nausea, vomiting, diarrhea, constipation, dizziness, abdominal pain, skin rash, fevers, chills, night sweats, weight loss, swollen lymph nodes, body aches, joint swelling,  chest pain, shortness of breath, mood changes.  Positive muscle aches  Objective  Blood pressure (!) 132/92, pulse 72, height 6\' 2"  (1.88 m), weight 219 lb (99.3 kg), SpO2 96 %.    General: No apparent distress alert and oriented x3 mood and affect normal, dressed appropriately.  HEENT: Pupils equal, extraocular movements intact  Respiratory: Patient's speak in full sentences and does not appear short of breath  Cardiovascular: No lower extremity edema, non tender, no erythema  Skin: Warm dry intact with no signs of infection or rash on extremities or on axial skeleton.  Abdomen: Soft nontender  Neuro: Cranial nerves II through XII are intact, neurovascularly intact in all  extremities with 2+ DTRs and 2+ pulses.  Lymph: No lymphadenopathy of posterior or anterior cervical chain or axillae bilaterally.  Gait normal with good balance and coordination.  MSK:  Non tender with full range of motion and good stability and symmetric strength and tone of , elbows, wrist, hip, knee and ankles bilaterally.  Mild arthritic changes of multiple joints  Patient's right shoulder exam shows that there is some bruising down the bicep.  Patient does have abnormality of the acromioclavicular joint compared to the contralateral side.  Tender to palpation over the acromioclavicular joint as well as the clavicle.  Patient has significant discomfort with any range of motion.  Mild crepitus.  Patient though is able to hold his arm greater than 95 degrees of forward flexion against resistance.  No pain with internal and external resisted force.  Limited musculoskeletal ultrasound was performed and interpreted by Lyndal Pulley  Limited ultrasound shows patient's rotator cuff has significant number of hypoechoic changes.  Difficult to entail but possible partial tear of the supraspinatus.  Subscapularis intact.  Hypoechoic changes with swelling noted of the bicep tendon.  Patient's distal clavicle does have a fracture but nondisplaced.  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    Impression and Recommendations:     This case required medical decision making of moderate complexity. The above documentation has been reviewed and is accurate and complete Lyndal Pulley, DO       Note: This dictation was prepared with Dragon dictation along with smaller phrase technology. Any transcriptional errors that result from this process are unintentional.

## 2019-05-18 DIAGNOSIS — C4442 Squamous cell carcinoma of skin of scalp and neck: Secondary | ICD-10-CM | POA: Diagnosis not present

## 2019-05-18 DIAGNOSIS — L988 Other specified disorders of the skin and subcutaneous tissue: Secondary | ICD-10-CM | POA: Diagnosis not present

## 2019-05-23 ENCOUNTER — Telehealth: Payer: Self-pay | Admitting: *Deleted

## 2019-05-23 NOTE — Telephone Encounter (Signed)
He can do things below waist height

## 2019-05-23 NOTE — Telephone Encounter (Signed)
Patient called stating he needed some clarification after his OV on 7/13. Should he be using his arm? If not, then he would like to come and get a sling.

## 2019-05-24 NOTE — Telephone Encounter (Signed)
Spoke with patient who stated that he was unsure if he needed to use a sling. Let him know that Dr. Tamala Julian is fine with him not using a sling but that he is to rest his arm as much as possible and to only perform movements with the arm that are at his waist. He is to refrain from overhead movements. Patient would like to get a sling for intermittent rest. Offer sling from office but also suggest that he might try the pharmacy first as they cost less than ones offered in office. Told to call if he has a hard time finding sling. Patient will call with any other questions.

## 2019-05-28 NOTE — Progress Notes (Signed)
[name] [address], [place] [address] Phone: [phone] Subjective:     CC: Shoulder pain follow-up  AST:[id number]  Cory Barnett is a 73 y.o. male coming in with complaint of right shoulder pain follow-up.  Patient was seen previously and had what appeared to be a cortical defect of the distal clavicle.  Patient states has noticed some improvement but still has significant weakness and decreasing range of motion.  Patient states that he is feeling about 30 to 40% better.     Past Medical History:  Diagnosis Date  . Anxiety   . AV block, 1st degree   . Basal cell carcinoma   . Cardiac murmur   . Cataract   . Elevated PSA measurement 2018  . Hyperlipidemia   . Hypertension 2015  . 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 IFG (impaired fasting glucose) 01/2016  . RBBB (right bundle branch block)   . Rheumatic fever    age 44 or 89, in hospital for a month, out of school for a yr  . Seasonal allergies   . Squamous cell carcinoma    Past Surgical History:  Procedure Laterality Date  . NASAL SEPTUM SURGERY    . NOSE SURGERY    . SKIN CANCER EXCISION     basal and squamous cell   . TONSILLECTOMY    . VASECTOMY    . WISDOM TOOTH EXTRACTION     Social History   Socioeconomic History  . Marital status: Married    Spouse name: Not on file  . Number of children: 3  . Years of education: Not on file  . Highest education level: Not on file  Occupational History    Comment: Retired  Scientific laboratory technician  . Financial resource strain: Not on file  . Food insecurity    Worry: Not on file    Inability: Not on file  . Transportation needs    Medical: Not on file    Non-medical: Not on file  Tobacco Use  . Smoking status: Former Research scientist (life sciences)  . Smokeless tobacco: Never Used  Substance and Sexual Activity  . Alcohol use: Yes    Alcohol/week: 2.0 standard drinks    Types: 2 Cans of beer per week  . Drug use: Never  . Sexual activity: Not on file   Lifestyle  . Physical activity    Days per week: Not on file    Minutes per session: Not on file  . Stress: Not on file  Relationships  . Social Herbalist on phone: Not on file    Gets together: Not on file    Attends religious service: Not on file    Active member of club or organization: Not on file    Attends meetings of clubs or organizations: Not on file    Relationship status: Not on file  Other Topics Concern  . Not on file  Social History Narrative  . Not on file   Allergies  Allergen Reactions  . Moxifloxacin Shortness Of Breath   Family History  Problem Relation Age of Onset  . Stroke Mother   . Arthritis Mother   . Cancer Mother        unknown   . Dementia Mother   . Arthritis Father   . Mesothelioma Father   . Arthritis Sister   . Arthritis Sister   . Bipolar disorder Sister       Current Outpatient Medications (  Cardiovascular):  .  losartan (COZAAR) 100 MG tablet, Take 1 tablet (100 mg total) by mouth daily. .  rosuvastatin (CRESTOR) 10 MG tablet, Take 10 mg by mouth daily. .  tadalafil (CIALIS) 10 MG tablet, Take 1 tablet (10 mg total) by mouth daily as needed for erectile dysfunction.   Current Outpatient Medications (Respiratory):  .  azelastine (ASTELIN) 0.1 % nasal spray, Place 1 spray into both nostrils 2 (two) times daily. Use in each nostril as directed (Patient taking differently: Place 1 spray into both nostrils as needed. Use in each nostril as directed) .  Loratadine (CLARITIN) 10 MG CAPS, Take 10 mg by mouth as needed.        Current Outpatient Medications (Other):  Marland Kitchen  AMBULATORY NON FORMULARY MEDICATION, Medication Name: Colon-Plus Caps  Takes 1-2 capsules by mouth daily .  Cholecalciferol (VITAMIN D-1000 MAX ST) 1000 units tablet, Take 2,000 mg by mouth daily.  .  Coenzyme Q10 (CO Q 10) 100 MG CAPS, Take 100 mg by mouth every morning. .  escitalopram (LEXAPRO) 5 MG tablet, Take 10 mg by mouth daily.  .  Multiple Vitamin  (MULTI-VITAMINS) TABS, Take 1 tablet by mouth daily. .  Omega-3 Fatty Acids (FISH OIL PO), Take 2 g by mouth daily. .  Probiotic Product (PROBIOTIC ADVANCED PO), Take 1 tablet by mouth daily with breakfast. .  Saw Palmetto 160 MG CAPS, Take 1,500 mg by mouth daily. .  Vitamin D, Ergocalciferol, (DRISDOL) 1.25 MG (50000 UT) CAPS capsule, Take 1 capsule (50,000 Units total) by mouth every 7 (seven) days.  Current Facility-Administered Medications (Other):  .  0.9 %  sodium chloride infusion    Past medical history, social, surgical and family history all reviewed in electronic medical record.  No pertanent information unless stated regarding to the chief complaint.   Review of Systems:  No headache, visual changes, nausea, vomiting, diarrhea, constipation, dizziness, abdominal pain, skin rash, fevers, chills, night sweats, weight loss, swollen lymph nodes, body aches, joint swelling, muscle aches, chest pain, shortness of breath, mood changes.   Objective  Blood pressure 124/72, pulse 63, resp. rate 14, height 6\' 2"  (1.88 m), weight 213 lb (96.6 kg), SpO2 97 %.    General: No apparent distress alert and oriented x3 mood and affect normal, dressed appropriately.  HEENT: Pupils equal, extraocular movements intact  Respiratory: Patient's speak in full sentences and does not appear short of breath  Cardiovascular: No lower extremity edema, non tender, no erythema  Skin: Warm dry intact with no signs of infection or rash on extremities or on axial skeleton.  Abdomen: Soft nontender  Neuro: Cranial nerves II through XII are intact, neurovascularly intact in all extremities with 2+ DTRs and 2+ pulses.  Lymph: No lymphadenopathy of posterior or anterior cervical chain or axillae bilaterally.  Gait normal with good balance and coordination.  MSK:  Non tender with full range of motion and good stability and symmetric strength and tone of elbows, wrist, hip, knee and ankles bilaterally.  Shoulder:  Right shoulder Inspection reveals no abnormalities, atrophy or asymmetry. Palpation diffusely tender Actively decreasing range of motion to 90 degrees in all planes.  Patient does have some weakness of the rotator cuff still noted.  Positive impingement noted.  Positive speeds Positive O'Brien's No apprehension sign  MSK US performed of: Right shoulder follow-up This study was ordered, performed, and interpreted by Charlann Boxer D.O.  Shoulder:   Supraspinatus: Patient does have a what appears to be increasing intrasubstance tearing  noted.  Potential cortical defect of the greater tuberosity as well.  Hypoechoic changes in this area with mild increase in Doppler flow. Subscapularis likely also has a tear noted with mild retraction, potentially high-grade.  Possible abnormality of the anterior labrum noted as well. Clavicle noticed some callus formation noted over the distal aspect.  Extra-articular. Impression: Rotator cuff tear no significant retraction at the moment healing interval change of the distal clavicle fracture   Impression and Recommendations:     This case required medical decision making of moderate complexity. The above documentation has been reviewed and is accurate and complete Lyndal Pulley, DO       Note: This dictation was prepared with Dragon dictation along with smaller phrase technology. Any transcriptional errors that result from this process are unintentional.

## 2019-05-29 ENCOUNTER — Ambulatory Visit: Payer: Self-pay

## 2019-05-29 ENCOUNTER — Ambulatory Visit (INDEPENDENT_AMBULATORY_CARE_PROVIDER_SITE_OTHER): Payer: Medicare Other | Admitting: Family Medicine

## 2019-05-29 ENCOUNTER — Other Ambulatory Visit: Payer: Self-pay

## 2019-05-29 ENCOUNTER — Encounter: Payer: Self-pay | Admitting: Family Medicine

## 2019-05-29 VITALS — BP 124/72 | HR 63 | Resp 14 | Ht 74.0 in | Wt 213.0 lb

## 2019-05-29 DIAGNOSIS — S46011A Strain of muscle(s) and tendon(s) of the rotator cuff of right shoulder, initial encounter: Secondary | ICD-10-CM

## 2019-05-29 DIAGNOSIS — G8929 Other chronic pain: Secondary | ICD-10-CM | POA: Diagnosis not present

## 2019-05-29 DIAGNOSIS — M25511 Pain in right shoulder: Secondary | ICD-10-CM | POA: Diagnosis not present

## 2019-05-29 DIAGNOSIS — M75101 Unspecified rotator cuff tear or rupture of right shoulder, not specified as traumatic: Secondary | ICD-10-CM | POA: Insufficient documentation

## 2019-05-29 DIAGNOSIS — S42034A Nondisplaced fracture of lateral end of right clavicle, initial encounter for closed fracture: Secondary | ICD-10-CM

## 2019-05-29 DIAGNOSIS — M19011 Primary osteoarthritis, right shoulder: Secondary | ICD-10-CM | POA: Diagnosis not present

## 2019-05-29 NOTE — Patient Instructions (Signed)
PT will call you Continue vitamin D See me again in 3-5 weeks

## 2019-05-29 NOTE — Assessment & Plan Note (Signed)
Now that patient's inflammation has decreased and is able to see on ultrasound that there is likely a rotator cuff tear noted.  Patient wants to start with formal physical therapy, icing regimen, we discussed topical anti-inflammatories, discussed which activities of doing which wants to avoid.  Patient is to increase activity slowly over the course the next several days.  Follow-up again 4 to 8 weeks.

## 2019-05-29 NOTE — Assessment & Plan Note (Signed)
Healing appropriately at this moment.  No change in management

## 2019-06-06 ENCOUNTER — Ambulatory Visit: Payer: Medicare Other | Attending: Family Medicine | Admitting: Physical Therapy

## 2019-06-06 ENCOUNTER — Encounter: Payer: Self-pay | Admitting: Physical Therapy

## 2019-06-06 ENCOUNTER — Other Ambulatory Visit: Payer: Self-pay

## 2019-06-06 DIAGNOSIS — M6281 Muscle weakness (generalized): Secondary | ICD-10-CM | POA: Insufficient documentation

## 2019-06-06 DIAGNOSIS — M25611 Stiffness of right shoulder, not elsewhere classified: Secondary | ICD-10-CM | POA: Insufficient documentation

## 2019-06-06 DIAGNOSIS — M25511 Pain in right shoulder: Secondary | ICD-10-CM | POA: Insufficient documentation

## 2019-06-06 NOTE — Therapy (Signed)
[facility] [address] [place], [place], [address] Phone: [phone]   Fax:  [phone]  Physical Therapy Evaluation  Patient Details  Name: Cory Barnett MRN: [id number] Date of Birth: [birth year] Referring Provider (PT): Dr. Hulan [name]   Encounter Date: 06/06/2019  PT End of Session - 06/06/19 1934    Visit Number  1    Date for PT Re-Evaluation  08/01/19    Authorization Type  Medicare    PT Start Time  1411    PT Stop Time  1500    PT Time Calculation (min)  49 min    Activity Tolerance  Patient tolerated treatment well       Past Medical History:  Diagnosis Date  . Anxiety   . AV block, 1st degree   . Basal cell carcinoma   . Cardiac murmur   . Cataract   . Elevated PSA measurement 2018  . Hyperlipidemia   . Hypertension 2015  . 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 IFG (impaired fasting glucose) 01/2016  . RBBB (right bundle branch block)   . Rheumatic fever    age 31 or 58, in hospital for a month, out of school for a yr  . Seasonal allergies   . Squamous cell carcinoma     Past Surgical History:  Procedure Laterality Date  . NASAL SEPTUM SURGERY    . NOSE SURGERY    . SKIN CANCER EXCISION     basal and squamous cell   . TONSILLECTOMY    . VASECTOMY    . WISDOM TOOTH EXTRACTION      There were no vitals filed for this visit.   Subjective Assessment - 06/06/19 1416    Subjective  July 8th while working stepped back and tripped over a pallet landing on right shoulder.  Whole arm numbness and inability to move.  Had a vasoreaction but recovered after 1 1/2 hours.  Presents in sling.  Lessening pain.  Doctor said to keep arm in visual periphery.  Sleeping OK.    Pertinent History  MD 8/25;  elevation within visual periphery    Limitations  Other (comment)    Diagnostic tests  x-ray fx clavicle in 2 places, U/S rotator cuff torn 60-65% torn    Patient Stated Goals  not have to have MRI or  surgery but close to 100% functioning    Currently in Pain?  Yes    Pain Score  2     Pain Location  Shoulder    Pain Orientation  Right;Anterior;Upper    Pain Type  Acute pain    Pain Onset  1 to 4 weeks ago    Aggravating Factors   Can't lift my arm at all;  pendulum circles    Pain Relieving Factors  CBD cream; not taking Advil as much now    Pain Score  2    Pain Location  Shoulder    Pain Orientation  Right    Pain Type  Acute pain    Pain Radiating Towards  superior and anterior shoulder    Pain Frequency  Intermittent    Aggravating Factors   elevation and abducting arm    Pain Relieving Factors  ice         [facility] PT Assessment - 06/06/19 0001      Assessment   Medical Diagnosis  right clavicular fracture; torn rotator cuff     Referring Provider (PT)  Dr. Alroy Dust  Smith    Onset Date/Surgical Date  05/10/19    Hand Dominance  Right    Next MD Visit  8/25    Prior Therapy  for knee      Precautions   Precautions  Shoulder    Precaution Comments  no lift >10# and not move outside visual periphery      Restrictions   Weight Bearing Restrictions  No      Balance Screen   Has the patient fallen in the past 6 months  Yes    How many times?  1    Has the patient had a decrease in activity level because of a fear of falling?   No    Is the patient reluctant to leave their home because of a fear of falling?   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; Automotive engineer, get on rolling ladder, avoiding lifting now    Leisure  spend time with grandkids, fishing, boating       Observation/Other Assessments   Focus on Therapeutic Outcomes (FOTO)   66% limitation       Posture/Postural Control   Posture/Postural Control  Postural limitations    Postural Limitations  Rounded  Shoulders;Forward head    Posture Comments  Able to retract scapulae and sit erect with cues       AROM   Right Shoulder Flexion  20 Degrees    Right Shoulder ABduction  88 Degrees    Right Shoulder Internal Rotation  --   T8   Right Shoulder External Rotation  20 Degrees    Left Shoulder Flexion  157 Degrees    Left Shoulder ABduction  166 Degrees    Left Shoulder Internal Rotation  --   T4   Left Shoulder External Rotation  82 Degrees      Strength   Overall Strength Comments  right shoulder not tested secondary to fracture acuity    Left Shoulder Flexion  5/5    Left Shoulder Extension  5/5    Left Shoulder ABduction  5/5    Left Shoulder Internal Rotation  5/5    Left Shoulder External Rotation  5/5      Palpation   Palpation comment  puffiness anterior right shoulder;  no tender points upper trap or periscapular                 Objective measurements completed on examination: See above findings.              PT Education - 06/06/19 1459    Education Details  Access Code: [email or web address] supine clasped hand and cane elevation;  elbow flexion ROM;  scapular squeezes, small pendulums,  resting arm in abducted position    Person(s) Educated  Patient    Methods  Explanation;Demonstration;Handout    Comprehension  Returned demonstration;Verbalized understanding       PT Short Term Goals - 06/06/19 1953      PT SHORT TERM GOAL #1   Title  The patient will demonstrate compliance with initial HEP for ROM to promote fracture healing    Time  4    Period  Weeks    Status  New    Target Date  07/04/19  PT SHORT TERM GOAL #2   Title  The patient will report a 30% improvement in shoulder pain with grooming/dressing tasks    Time  4    Period  Weeks    Status  New      PT SHORT TERM GOAL #3   Title  Right shoulder active or active assisted elevation to 90 degrees needed for home and work ADLs    Time  4    Period  Weeks    Status  New      PT SHORT  TERM GOAL #4   Title  FOTO functional outcome score improved from 66% limitation to 55%    Time  4    Period  Weeks    Status  New        PT Long Term Goals - 06/06/19 1955      PT LONG TERM GOAL #1   Title  The patient will be independent in safe self progression of HEP  for further improvements in ROM and strength    Time  8    Period  Weeks    Status  New    Target Date  08/01/19      PT LONG TERM GOAL #2   Title  The patient will have improved right shoulder elevation to 120 degrees needed for reaching eye level shelves and personal care    Time  8    Period  Weeks    Status  New      PT LONG TERM GOAL #3   Title  The patient will have improved pain with  usual ADLs by at least 60%    Time  8    Period  Weeks    Status  New      PT LONG TERM GOAL #4   Title  FOTO functional outcome score improved from 66% limitation to 36% indicating improved function with less pain    Time  8    Period  Weeks    Status  New      PT LONG TERM GOAL #5   Title  The patient will have grossly 3+/5 to 4-/5 strength in deltoids and biceps needed for light lifting needed at home and work    Time  Sunshine - 06/06/19 1511    Clinical Impression Statement  The patient was at work on 05/10/19 when he stepped backward tripping on a pallet.  He sustained a right 2 part clavicular fracture and has been treated conservatively (non-surgically) and is wearing a sling.  He states he had a diagnostic ultrasound which showed a 60-65% rotator cuff tear.  He complains of the inability to elevate his arm actively and uses his left arm to assist with ADLs.    Difficulty with eating, taking off a tight shirt and using his right UE for grooming/dressing.  He continues to work but modifies activities and avoids lifting.  Active ROM in sitting:  flexion 20 degrees, abduction 88 degrees, internal rotation to L5, external rotation 20 degrees.  In supine, he has 90  degrees of passive and active assisted flexion.  Less pain with bent elbow lowering and avoiding extension beyond neutral in supine.  He would benefit from low intensity PT with precautions to protect fracture and promote healing while slowly restoring ROM and strength.    Personal Factors  and Comorbidities  Age;Comorbidity 1;Comorbidity 2    Comorbidities  osteoarthritis knees, shoulder;  HTN    Examination-Activity Limitations  Reach Overhead;Self Feeding;Dressing;Lift;Carry    Examination-Participation Restrictions  Community Activity;Other    Stability/Clinical Decision Making  Stable/Uncomplicated    Clinical Decision Making  Low    Rehab Potential  Good    PT Frequency  2x / week    PT Duration  8 weeks    PT Treatment/Interventions  ADLs/Self Care Home Management;Electrical Stimulation;Cryotherapy;Moist Heat;Therapeutic activities;Therapeutic exercise;Neuromuscular re-education;Manual techniques;Patient/family education;Taping    PT Next Visit Plan  Try ES for pain control;  assisted right shoulder ROM to 90 degrees until at least 8/19;  gentle isometrics; elbow and forearm ROM;  ball squeezes; scapular retractions;  posterior capsule stretch, avoid hyperextensions;  MD 8/25    PT Home Exercise Plan  Access Code: [email or web address]    Consulted and Agree with Plan of Care  Patient       Patient will benefit from skilled therapeutic intervention in order to improve the following deficits and impairments:  Decreased range of motion, Impaired UE functional use, Decreased activity tolerance, Impaired perceived functional ability, Pain, Decreased strength  Visit Diagnosis: 1. Acute pain of right shoulder   2. Stiffness of right shoulder, not elsewhere classified   3. Muscle weakness (generalized)        Problem List Patient Active Problem List   Diagnosis Date Noted  . Rotator cuff tear, right 05/29/2019  . Closed fracture of distal clavicle 05/15/2019  . Acromioclavicular joint arthritis  05/15/2019   [name], PT 06/06/19 8:07 PM Phone: [phone] Fax: [phone] Cory Barnett 06/06/2019, 8:06 PM  [facility] [address] [place], [place], [address] Phone: [phone]   Fax:  [phone]  Name: Cory Barnett MRN: [id number] Date of Birth: 1946/08/24

## 2019-06-06 NOTE — Patient Instructions (Signed)
Access Code: [email or web address]  URL: [email or web address]  Date: 06/06/2019  Prepared by: [name]   Exercises  Supine Shoulder Flexion AAROM with Hands Clasped - 5 reps - 1 sets - 1-2x daily - 7x weekly  Supine Shoulder Flexion Overhead with Dowel - 5 reps - 1 sets - 1-2x daily - 7x weekly  Seated Elbow Flexion AAROM - 10 reps - 1 sets - 1x daily - 7x weekly

## 2019-06-08 ENCOUNTER — Other Ambulatory Visit: Payer: Self-pay

## 2019-06-08 ENCOUNTER — Ambulatory Visit: Payer: Medicare Other | Admitting: Physical Therapy

## 2019-06-08 ENCOUNTER — Encounter: Payer: Self-pay | Admitting: Physical Therapy

## 2019-06-08 DIAGNOSIS — M25611 Stiffness of right shoulder, not elsewhere classified: Secondary | ICD-10-CM | POA: Diagnosis not present

## 2019-06-08 DIAGNOSIS — M6281 Muscle weakness (generalized): Secondary | ICD-10-CM

## 2019-06-08 DIAGNOSIS — M25511 Pain in right shoulder: Secondary | ICD-10-CM

## 2019-06-08 NOTE — Therapy (Signed)
[facility] [address], [place], [place], [address] Phone: [phone]   Fax:  [phone]  Physical Therapy Treatment  Patient Details  Name: Cory Barnett MRN: [id number] Date of Birth: 1946/10/06 Referring Provider (PT): Dr. Hulan [name]   Encounter Date: 06/08/2019  PT End of Session - 06/08/19 1656    Visit Number  2    Date for PT Re-Evaluation  08/01/19    Authorization Type  Medicare    PT Start Time  1600    PT Stop Time  1640    PT Time Calculation (min)  40 min    Activity Tolerance  Patient tolerated treatment well       Past Medical History:  Diagnosis Date  . Anxiety   . AV block, 1st degree   . Basal cell carcinoma   . Cardiac murmur   . Cataract   . Elevated PSA measurement 2018  . Hyperlipidemia   . Hypertension 2015  . 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 IFG (impaired fasting glucose) 01/2016  . RBBB (right bundle branch block)   . Rheumatic fever    age 54 or 23, in hospital for a month, out of school for a yr  . Seasonal allergies   . Squamous cell carcinoma     Past Surgical History:  Procedure Laterality Date  . NASAL SEPTUM SURGERY    . NOSE SURGERY    . SKIN CANCER EXCISION     basal and squamous cell   . TONSILLECTOMY    . VASECTOMY    . WISDOM TOOTH EXTRACTION      There were no vitals filed for this visit.  Subjective Assessment - 06/08/19 1602    Subjective  I can feel it at the end of the day.  Exercises are going OK.     Pertinent History  MD 8/25;  elevation within visual periphery    Diagnostic tests  x-ray fx clavicle in 2 places, U/S rotator cuff torn 60-65% torn    Patient Stated Goals  not have to have MRI or surgery but close to 100% functioning    Currently in Pain?  Yes    Pain Score  2     Pain Location  Shoulder    Pain Orientation  Right    Pain Type  Acute pain                       [facility] Adult PT Treatment/Exercise - 06/08/19 0001       Self-Care   Other Self-Care Comments   home TENs set up/info      Shoulder Exercises: Supine   Flexion  AAROM;Right;10 reps   UE Ranger assist    Other Supine Exercises  5# finger/grip squeezes 30x     Other Supine Exercises  elbow flexion 15x       Shoulder Exercises: Seated   Other Seated Exercises  UE Ranger on floor small increments     Other Seated Exercises  scapular retractions 10x      Shoulder Exercises: Standing   Other Standing Exercises  UE Ranger on floor 30x      Shoulder Exercises: Isometric Strengthening   External Rotation  5X5"    External Rotation Limitations  30% effort against UE Ranger  In supine      Electrical Stimulation   Electrical Stimulation Location  right shoulder    Electrical Stimulation Action  IFC  Electrical Stimulation Parameters  11-14 ma 30 min    concurrent with ex for 20 min    Electrical Stimulation Goals  Pain      Neck Exercises: Stretches   Other Neck Stretches  right/left sidebending 5x each way no overpressure     Other Neck Stretches  levator right/left 5x no overpressure             PT Education - 06/08/19 1630    Education Details  home TENs info    Person(s) Educated  Patient    Methods  Explanation;Handout    Comprehension  Verbalized understanding       PT Short Term Goals - 06/06/19 1953      PT SHORT TERM GOAL #1   Title  The patient will demonstrate compliance with initial HEP for ROM to promote fracture healing    Time  4    Period  Weeks    Status  New    Target Date  07/04/19      PT SHORT TERM GOAL #2   Title  The patient will report a 30% improvement in shoulder pain with grooming/dressing tasks    Time  4    Period  Weeks    Status  New      PT SHORT TERM GOAL #3   Title  Right shoulder active or active assisted elevation to 90 degrees needed for home and work ADLs    Time  4    Period  Weeks    Status  New      PT SHORT TERM GOAL #4   Title  FOTO functional outcome score  improved from 66% limitation to 55%    Time  4    Period  Weeks    Status  New        PT Long Term Goals - 06/06/19 1955      PT LONG TERM GOAL #1   Title  The patient will be independent in safe self progression of HEP  for further improvements in ROM and strength    Time  8    Period  Weeks    Status  New    Target Date  08/01/19      PT LONG TERM GOAL #2   Title  The patient will have improved right shoulder elevation to 120 degrees needed for reaching eye level shelves and personal care    Time  8    Period  Weeks    Status  New      PT LONG TERM GOAL #3   Title  The patient will have improved pain with  usual ADLs by at least 60%    Time  8    Period  Weeks    Status  New      PT LONG TERM GOAL #4   Title  FOTO functional outcome score improved from 66% limitation to 36% indicating improved function with less pain    Time  8    Period  Weeks    Status  New      PT LONG TERM GOAL #5   Title  The patient will have grossly 3+/5 to 4-/5 strength in deltoids and biceps needed for light lifting needed at home and work    Time  8    Period  Weeks    Status  New            Plan - 06/08/19 1656    Clinical Impression Statement  The patient reports good pain relief and improved UE with electrical stimulation concurrently with exercise.  He is interested in having a home TENS for home and work use and was given info on options.  Less discomfort in standing ROM vs. seated and supine.  Therapist closely monitoring response and modifying exercises as needed for pain and to ensure protective precautions for fracture healing.     Comorbidities  osteoarthritis knees, shoulder;  HTN    Examination-Activity Limitations  Reach Overhead;Self Feeding;Dressing;Lift;Carry    Examination-Participation Restrictions  Community Activity;Other    Clinical Decision Making  Low    Rehab Potential  Good    PT Frequency  2x / week    PT Treatment/Interventions  ADLs/Self Care Home  Management;Electrical Stimulation;Cryotherapy;Moist Heat;Therapeutic activities;Therapeutic exercise;Neuromuscular re-education;Manual techniques;Patient/family education;Taping    PT Next Visit Plan  Try ES for pain control as needed (he may get one for home);    assisted right shoulder ROM to 90 degrees until at least 8/19; UE Ranger on floor;    gentle isometrics; elbow and forearm ROM;  ball squeezes; scapular retractions;  avoid hyperextensions; possible NMES to deltoids  MD 8/25       Patient will benefit from skilled therapeutic intervention in order to improve the following deficits and impairments:  Decreased range of motion, Impaired UE functional use, Decreased activity tolerance, Impaired perceived functional ability, Pain, Decreased strength  Visit Diagnosis: 1. Acute pain of right shoulder   2. Stiffness of right shoulder, not elsewhere classified   3. Muscle weakness (generalized)        Problem List Patient Active Problem List   Diagnosis Date Noted  . Rotator cuff tear, right 05/29/2019  . Closed fracture of distal clavicle 05/15/2019  . 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 [place], [place], [address] Phone: [phone]   Fax:  [phone]  Name: Cory Barnett MRN: [id number] Date of Birth: [birth year]

## 2019-06-08 NOTE — Patient Instructions (Signed)
   TENS UNIT  This is helpful for muscle pain and spasm.   Search and Purchase a TENS 7000 2nd edition at www.tenspros.com or www.amazon.com  (It should be less than $30)     TENS unit instructions:   Do not shower or bathe with the unit on  Turn the unit off before removing electrodes or batteries  If the electrodes lose stickiness add a drop of water to the electrodes after they are disconnected from the unit and place on plastic sheet. If you continued to have difficulty, call the TENS unit company to purchase more electrodes.  Do not apply lotion on the skin area prior to use. Make sure the skin is clean and dry as this will help prolong the life of the electrodes.  After use, always check skin for unusual red areas, rash or other skin difficulties. If there are any skin problems, does not apply electrodes to the same area.  Never remove the electrodes from the unit by pulling the wires.  Do not use the TENS unit or electrodes other than as directed.  Do not change electrode placement without consulting your therapist or physician.  Keep 2 fingers with between each electrode.   Stacy Simpson PT [facility] [address], [address] [place], [place] [address] Phone # [phone] Fax [phone] 

## 2019-06-13 ENCOUNTER — Ambulatory Visit: Payer: Medicare Other | Admitting: Physical Therapy

## 2019-06-13 ENCOUNTER — Other Ambulatory Visit: Payer: Self-pay

## 2019-06-13 DIAGNOSIS — M6281 Muscle weakness (generalized): Secondary | ICD-10-CM | POA: Diagnosis not present

## 2019-06-13 DIAGNOSIS — M25511 Pain in right shoulder: Secondary | ICD-10-CM | POA: Diagnosis not present

## 2019-06-13 DIAGNOSIS — M25611 Stiffness of right shoulder, not elsewhere classified: Secondary | ICD-10-CM

## 2019-06-13 NOTE — Therapy (Signed)
[facility] [address], [place], [place], [address] Phone: [phone]   Fax:  [phone]  Physical Therapy Treatment  Patient Details  Name: Cory Barnett MRN: [id number] Date of Birth: [birth year] Referring Provider (PT): Dr. Hulan [name]   Encounter Date: 06/13/2019  PT End of Session - 06/13/19 1706    Visit Number  3    Date for PT Re-Evaluation  08/01/19    Authorization Type  Medicare    PT Start Time  2542    PT Stop Time  1555    PT Time Calculation (min)  40 min    Activity Tolerance  Patient tolerated treatment well       Past Medical History:  Diagnosis Date  . Anxiety   . AV block, 1st degree   . Basal cell carcinoma   . Cardiac murmur   . Cataract   . Elevated PSA measurement 2018  . Hyperlipidemia   . Hypertension 2015  . 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 IFG (impaired fasting glucose) 01/2016  . RBBB (right bundle branch block)   . Rheumatic fever    age 47 or 76, in hospital for a month, out of school for a yr  . Seasonal allergies   . Squamous cell carcinoma     Past Surgical History:  Procedure Laterality Date  . NASAL SEPTUM SURGERY    . NOSE SURGERY    . SKIN CANCER EXCISION     basal and squamous cell   . TONSILLECTOMY    . VASECTOMY    . WISDOM TOOTH EXTRACTION      There were no vitals filed for this visit.  Subjective Assessment - 06/13/19 1659    Subjective  Patient presents without sling and states he doesn't wear it anymore.  The ex's are going fine.  Got a home TENS unit for home which really helps.    Pertinent History  MD 8/25;  elevation within visual periphery    Diagnostic tests  x-ray fx clavicle in 2 places, U/S rotator cuff torn 60-65% torn    Patient Stated Goals  not have to have MRI or surgery but close to 100% functioning    Currently in Pain?  Yes    Pain Score  1     Pain Location  Shoulder    Pain Orientation  Right                        [facility] Adult PT Treatment/Exercise - 06/13/19 0001      Shoulder Exercises: Supine   Flexion  AAROM;Right;10 reps    Flexion Limitations  clasped hands   head of bed elevated;  towel roll behind elbow      Shoulder Exercises: ROM/Strengthening   Other ROM/Strengthening Exercises standing UE ranger gentle forward and back on floor, 1st and 2nd step 8-10x each       Shoulder Exercises: Isometric Strengthening   Flexion  5X10"    Flexion Limitations  seated    Extension  Supine;5X10"    ABduction  5X10"    ABduction Limitations  seated     Other Isometric Exercises  biceps and triceps isometrics 5x 10 sec hold concurrent with ES      Electrical Stimulation   Electrical Stimulation Location  right anterior, middle, posterior deltoid and biceps    Electrical Stimulation Action  Russian     Electrical Stimulation Parameters  10/10 15 min  intensity 15-20 V   part of the time concurrent with ex   Electrical Stimulation Goals  Neuromuscular facilitation      Manual Therapy   Passive ROM  attempted supine with head of bed elevated:  10x elbow flexion/extension and flexion 10x   painful             PT Education - 06/13/19 1546    Education Details  Access Code: [email or web address]       PT Short Term Goals - 06/06/19 1953      PT SHORT TERM GOAL #1   Title  The patient will demonstrate compliance with initial HEP for ROM to promote fracture healing    Time  4    Period  Weeks    Status  New    Target Date  07/04/19      PT SHORT TERM GOAL #2   Title  The patient will report a 30% improvement in shoulder pain with grooming/dressing tasks    Time  4    Period  Weeks    Status  New      PT SHORT TERM GOAL #3   Title  Right shoulder active or active assisted elevation to 90 degrees needed for home and work ADLs    Time  4    Period  Weeks    Status  New      PT SHORT TERM GOAL #4   Title  FOTO functional outcome score improved from 66%  limitation to 55%    Time  4    Period  Weeks    Status  New        PT Long Term Goals - 06/06/19 1955      PT LONG TERM GOAL #1   Title  The patient will be independent in safe self progression of HEP  for further improvements in ROM and strength    Time  8    Period  Weeks    Status  New    Target Date  08/01/19      PT LONG TERM GOAL #2   Title  The patient will have improved right shoulder elevation to 120 degrees needed for reaching eye level shelves and personal care    Time  8    Period  Weeks    Status  New      PT LONG TERM GOAL #3   Title  The patient will have improved pain with  usual ADLs by at least 60%    Time  8    Period  Weeks    Status  New      PT LONG TERM GOAL #4   Title  FOTO functional outcome score improved from 66% limitation to 36% indicating improved function with less pain    Time  8    Period  Weeks    Status  New      PT LONG TERM GOAL #5   Title  The patient will have grossly 3+/5 to 4-/5 strength in deltoids and biceps needed for light lifting needed at home and work    Time  8    Period  Weeks    Status  New            Plan - 06/13/19 1537    Clinical Impression Statement  The patient reports increased pain with passive ROM of right shoulder vs less pain noted with active assisted ROM with clasped hands.  Standing UE Ranger assisted ROM is  fairly comfortable at low ranges of flexion but moderately painful > 40 degrees of flexion.  Moderate cues to keep isometrics at a 30% submaximal level and painfree.  Good initial response with [place] ES to decrease muscle atrophy during this protective phase of fracture healing.  Therapist closely monitoring pain level and modifying as needed.    Comorbidities  osteoarthritis knees, shoulder;  HTN    Examination-Activity Limitations  Reach Overhead;Self Feeding;Dressing;Lift;Carry    Examination-Participation Restrictions  Community Activity;Other    Rehab Potential  Good    PT Frequency  2x  / week    PT Duration  8 weeks    PT Treatment/Interventions  ADLs/Self Care Home Management;Electrical Stimulation;Cryotherapy;Moist Heat;Therapeutic activities;Therapeutic exercise;Neuromuscular re-education;Manual techniques;Patient/family education;Taping    PT Next Visit Plan  Russian ES to deltoids and biceps as needed to decrease disuse atrophy;  active assisted ROM gentle;  gentle isometrics;  still in protective phase for clavicular 2 part fracture healing    PT Home Exercise Plan  Access Code: [email or web address]       Patient will benefit from skilled therapeutic intervention in order to improve the following deficits and impairments:  Decreased range of motion, Impaired UE functional use, Decreased activity tolerance, Impaired perceived functional ability, Pain, Decreased strength  Visit Diagnosis: 1. Acute pain of right shoulder   2. Stiffness of right shoulder, not elsewhere classified   3. Muscle weakness (generalized)        Problem List Patient Active Problem List   Diagnosis Date Noted  . Rotator cuff tear, right 05/29/2019  . Closed fracture of distal clavicle 05/15/2019  . 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, [place], [place], [address] Phone: [phone]   Fax:  [phone]  Name: Cory Barnett MRN: [id number] Date of Birth: 11-Sep-1946

## 2019-06-13 NOTE — Therapy (Signed)
[facility] [address] [place], [place], [address] Phone: [phone]   Fax:  [phone]  Physical Therapy Treatment  Patient Details  Name: Cory Barnett MRN: [id number] Date of Birth: 17-Nov-1945 Referring Provider (PT): Dr. Hulan [name]   Encounter Date: 06/13/2019  PT End of Session - 06/13/19 1706    Visit Number  3    Date for PT Re-Evaluation  08/01/19    Authorization Type  Medicare    PT Start Time  5638    PT Stop Time  1555    PT Time Calculation (min)  40 min    Activity Tolerance  Patient tolerated treatment well       Past Medical History:  Diagnosis Date  . Anxiety   . AV block, 1st degree   . Basal cell carcinoma   . Cardiac murmur   . Cataract   . Elevated PSA measurement 2018  . Hyperlipidemia   . Hypertension 2015  . 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 IFG (impaired fasting glucose) 01/2016  . RBBB (right bundle branch block)   . Rheumatic fever    age 73 or 70, in hospital for a month, out of school for a yr  . Seasonal allergies   . Squamous cell carcinoma     Past Surgical History:  Procedure Laterality Date  . NASAL SEPTUM SURGERY    . NOSE SURGERY    . SKIN CANCER EXCISION     basal and squamous cell   . TONSILLECTOMY    . VASECTOMY    . WISDOM TOOTH EXTRACTION      There were no vitals filed for this visit.  Subjective Assessment - 06/13/19 1659    Subjective  Patient presents without sling and states he doesn't wear it anymore.  The ex's are going fine.  Got a home TENS unit for home which really helps.    Pertinent History  MD 8/25;  elevation within visual periphery    Diagnostic tests  x-ray fx clavicle in 2 places, U/S rotator cuff torn 60-65% torn    Patient Stated Goals  not have to have MRI or surgery but close to 100% functioning    Currently in Pain?  Yes    Pain Score  1     Pain Location  Shoulder    Pain Orientation  Right                        [facility] Adult PT Treatment/Exercise - 06/13/19 0001      Shoulder Exercises: Supine   Flexion  AAROM;Right;10 reps    Flexion Limitations  clasped hands   head of bed elevated;  towel roll behind elbow      Shoulder Exercises: ROM/Strengthening   Other ROM/Strengthening Exercises  UE ranger gentle forward and back on floor, 1st and 2nd step 8-10x each       Shoulder Exercises: Isometric Strengthening   Flexion  5X10"    Flexion Limitations  seated    Extension  Supine;5X10"    ABduction  5X10"    ABduction Limitations  seated     Other Isometric Exercises  biceps and triceps isometrics 5x 10 sec hold concurrent with ES      Electrical Stimulation   Electrical Stimulation Location  right anterior, middle, posterior deltoid and biceps    Electrical Stimulation Action  Russian     Electrical Stimulation Parameters  10/10 15 min  intensity 15-20 V   part of the time concurrent with ex   Electrical Stimulation Goals  Neuromuscular facilitation      Manual Therapy   Passive ROM  attempted supine with head of bed elevated:  10x elbow flexion/extension and flexion 10x   painful             PT Education - 06/13/19 2136    Education Details  Isometrics on Medbridge    Person(s) Educated  Patient    Methods  Explanation;Demonstration;Handout    Comprehension  Returned demonstration;Verbalized understanding       PT Short Term Goals - 06/06/19 1953      PT SHORT TERM GOAL #1   Title  The patient will demonstrate compliance with initial HEP for ROM to promote fracture healing    Time  4    Period  Weeks    Status  New    Target Date  07/04/19      PT SHORT TERM GOAL #2   Title  The patient will report a 30% improvement in shoulder pain with grooming/dressing tasks    Time  4    Period  Weeks    Status  New      PT SHORT TERM GOAL #3   Title  Right shoulder active or active assisted elevation to 90 degrees needed for home and work ADLs     Time  4    Period  Weeks    Status  New      PT SHORT TERM GOAL #4   Title  FOTO functional outcome score improved from 66% limitation to 55%    Time  4    Period  Weeks    Status  New        PT Long Term Goals - 06/06/19 1955      PT LONG TERM GOAL #1   Title  The patient will be independent in safe self progression of HEP  for further improvements in ROM and strength    Time  8    Period  Weeks    Status  New    Target Date  08/01/19      PT LONG TERM GOAL #2   Title  The patient will have improved right shoulder elevation to 120 degrees needed for reaching eye level shelves and personal care    Time  8    Period  Weeks    Status  New      PT LONG TERM GOAL #3   Title  The patient will have improved pain with  usual ADLs by at least 60%    Time  8    Period  Weeks    Status  New      PT LONG TERM GOAL #4   Title  FOTO functional outcome score improved from 66% limitation to 36% indicating improved function with less pain    Time  8    Period  Weeks    Status  New      PT LONG TERM GOAL #5   Title  The patient will have grossly 3+/5 to 4-/5 strength in deltoids and biceps needed for light lifting needed at home and work    Time  8    Period  Weeks    Status  New            Plan - 06/13/19 1537    Clinical Impression Statement  The patient reports increased pain with passive ROM  of right shoulder vs less pain noted with active assisted ROM with clasped hands.  Standing UE Ranger assisted ROM is fairly comfortable at low ranges of flexion but moderately painful > 40 degrees of flexion.  Moderate cues to keep isometrics at a 30% submaximal level and painfree.  Good initial response with [place] ES to decrease muscle atrophy during this protective phase of fracture healing.  Therapist closely monitoring pain level and modifying as needed.    Comorbidities  osteoarthritis knees, shoulder;  HTN    Examination-Activity Limitations  Reach Overhead;Self  Feeding;Dressing;Lift;Carry    Examination-Participation Restrictions  Community Activity;Other    Rehab Potential  Good    PT Frequency  2x / week    PT Duration  8 weeks    PT Treatment/Interventions  ADLs/Self Care Home Management;Electrical Stimulation;Cryotherapy;Moist Heat;Therapeutic activities;Therapeutic exercise;Neuromuscular re-education;Manual techniques;Patient/family education;Taping    PT Next Visit Plan  [place] ES to deltoids and biceps as needed to decrease disuse atrophy;  active assisted ROM gentle;  gentle isometrics;  still in protective phase for clavicular 2 part fracture healing    PT Home Exercise Plan  Access Code: [email or web address]       Patient will benefit from skilled therapeutic intervention in order to improve the following deficits and impairments:  Decreased range of motion, Impaired UE functional use, Decreased activity tolerance, Impaired perceived functional ability, Pain, Decreased strength  Visit Diagnosis: 1. Acute pain of right shoulder   2. Stiffness of right shoulder, not elsewhere classified   3. Muscle weakness (generalized)        Problem List Patient Active Problem List   Diagnosis Date Noted  . Rotator cuff tear, right 05/29/2019  . Closed fracture of distal clavicle 05/15/2019  . 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, [place], [place], [address] Phone: [phone]   Fax:  [phone]  Name: HAYK DIVIS MRN: [id number] Date of Birth: Jul 31, 1946

## 2019-06-13 NOTE — Patient Instructions (Signed)
Not able to copy on Medbridge but patient was given handout on deltoid isometrics.    [name] PT [facility] [address] [place], [place] [address] Phone # [phone] Fax [phone]

## 2019-06-15 ENCOUNTER — Ambulatory Visit: Payer: Medicare Other | Admitting: Physical Therapy

## 2019-06-15 ENCOUNTER — Other Ambulatory Visit: Payer: Self-pay

## 2019-06-15 ENCOUNTER — Encounter: Payer: Self-pay | Admitting: Physical Therapy

## 2019-06-15 DIAGNOSIS — M6281 Muscle weakness (generalized): Secondary | ICD-10-CM

## 2019-06-15 DIAGNOSIS — M25511 Pain in right shoulder: Secondary | ICD-10-CM | POA: Diagnosis not present

## 2019-06-15 DIAGNOSIS — M25611 Stiffness of right shoulder, not elsewhere classified: Secondary | ICD-10-CM | POA: Diagnosis not present

## 2019-06-15 NOTE — Therapy (Signed)
[facility] [address] [place], [place], [address] Phone: [phone]   Fax:  [phone]  Physical Therapy Treatment  Patient Details  Name: Cory Barnett MRN: [id number] Date of Birth: [birth year] Referring Provider (PT): Dr. Hulan [name]   Encounter Date: 06/15/2019  PT End of Session - 06/15/19 1521    Visit Number  4    Date for PT Re-Evaluation  08/01/19    Authorization Type  Medicare    PT Start Time  1500    PT Stop Time  1540    PT Time Calculation (min)  40 min    Activity Tolerance  Patient tolerated treatment well;No increased pain    Behavior During Therapy  WFL for tasks assessed/performed       Past Medical History:  Diagnosis Date  . Anxiety   . AV block, 1st degree   . Basal cell carcinoma   . Cardiac murmur   . Cataract   . Elevated PSA measurement 2018  . Hyperlipidemia   . Hypertension 2015  . 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 IFG (impaired fasting glucose) 01/2016  . RBBB (right bundle branch block)   . Rheumatic fever    age 21 or 67, in hospital for a month, out of school for a yr  . Seasonal allergies   . Squamous cell carcinoma     Past Surgical History:  Procedure Laterality Date  . NASAL SEPTUM SURGERY    . NOSE SURGERY    . SKIN CANCER EXCISION     basal and squamous cell   . TONSILLECTOMY    . VASECTOMY    . WISDOM TOOTH EXTRACTION      There were no vitals filed for this visit.  Subjective Assessment - 06/15/19 1510    Subjective  Pt states that things are going well. He is having some issues with his new exercises.    Pertinent History  MD 8/25;  elevation within visual periphery    Diagnostic tests  x-ray fx clavicle in 2 places, U/S rotator cuff torn 60-65% torn    Patient Stated Goals  not have to have MRI or surgery but close to 100% functioning    Currently in Pain?  Yes    Pain Score  4     Pain Location  Shoulder    Pain Orientation  Posterior    Pain  Descriptors / Indicators  Aching    Pain Type  Chronic pain    Pain Radiating Towards  none    Pain Onset  More than a month ago    Pain Frequency  Constant    Aggravating Factors   shoulder movement    Pain Relieving Factors  resting                       [facility] Adult PT Treatment/Exercise - 06/15/19 0001      Exercises   Exercises  Elbow      Elbow Exercises   Elbow Flexion  Right;Strengthening;15 reps    Theraband Level (Elbow Flexion)  Level 4 (Blue)    Elbow Flexion Limitations  2x15 reps full range, x15 reps partial range above and below 90 deg     Elbow Extension  Strengthening;Left;15 reps    Theraband Level (Elbow Extension)  Level 3 (Green)    Elbow Extension Limitations  x2     Forearm Supination Limitations  Rt forearm supination/pronation holding end of 4#  dumbbell 2x15 reps     Other elbow exercises  Wrist extension #4 2x15 reps    Other elbow exercises  Wrist flexion 2x15 reps       Shoulder Exercises: Seated   Other Seated Exercises  Rt shoulder flexion, abduction isometric x5 sec hold     Other Seated Exercises  Rt scap retraction/protraction through pain free range x10 reps, Rt scap elevation/depression pain free x10 reps       Shoulder Exercises: Pulleys   Flexion  3 minutes    Flexion Limitations  encouraged to complete pain free     Scaption  2 minutes    Scaption Limitations  below 90 deg       Manual Therapy   Manual therapy comments  trigger point release Rt levator scap with active Lt cervical rotation and sidebend              PT Education - 06/15/19 1546    Education Details  technique with HEP    Person(s) Educated  Patient    Methods  Explanation;Handout;Verbal cues    Comprehension  Verbalized understanding;Returned demonstration       PT Short Term Goals - 06/06/19 1953      PT SHORT TERM GOAL #1   Title  The patient will demonstrate compliance with initial HEP for ROM to promote fracture healing    Time  4     Period  Weeks    Status  New    Target Date  07/04/19      PT SHORT TERM GOAL #2   Title  The patient will report a 30% improvement in shoulder pain with grooming/dressing tasks    Time  4    Period  Weeks    Status  New      PT SHORT TERM GOAL #3   Title  Right shoulder active or active assisted elevation to 90 degrees needed for home and work ADLs    Time  4    Period  Weeks    Status  New      PT SHORT TERM GOAL #4   Title  FOTO functional outcome score improved from 66% limitation to 55%    Time  4    Period  Weeks    Status  New        PT Long Term Goals - 06/06/19 1955      PT LONG TERM GOAL #1   Title  The patient will be independent in safe self progression of HEP  for further improvements in ROM and strength    Time  8    Period  Weeks    Status  New    Target Date  08/01/19      PT LONG TERM GOAL #2   Title  The patient will have improved right shoulder elevation to 120 degrees needed for reaching eye level shelves and personal care    Time  8    Period  Weeks    Status  New      PT LONG TERM GOAL #3   Title  The patient will have improved pain with  usual ADLs by at least 60%    Time  8    Period  Weeks    Status  New      PT LONG TERM GOAL #4   Title  FOTO functional outcome score improved from 66% limitation to 36% indicating improved function with less pain    Time  8    Period  Weeks    Status  New      PT LONG TERM GOAL #5   Title  The patient will have grossly 3+/5 to 4-/5 strength in deltoids and biceps needed for light lifting needed at home and work    Time  8    Period  Weeks    Status  New            Plan - 06/15/19 1547    Clinical Impression Statement  Pt is doing well with his home program but had some concerns about the isometrics added at his last session. Therapist reviewed the set-up of these and he was then able to demonstrate pain free activation of the shoulder. Focused on gentle, pain free scapula movement as well as  elbow and wrist strength. Pt was able to complete all exercises without difficulty, noting muscle fatigue only. Pt had palpable trigger point in the Rt levator scap and this was decreased following manual treatment to the area. He noted this felt much better end of session. Will continue to progress shoulder ROM and RUE strength as allowed by MD protocol.    Comorbidities  osteoarthritis knees, shoulder;  HTN    Examination-Activity Limitations  Reach Overhead;Self Feeding;Dressing;Lift;Carry    Examination-Participation Restrictions  Community Activity;Other    Rehab Potential  Good    PT Frequency  2x / week    PT Duration  8 weeks    PT Treatment/Interventions  ADLs/Self Care Home Management;Electrical Stimulation;Cryotherapy;Moist Heat;Therapeutic activities;Therapeutic exercise;Neuromuscular re-education;Manual techniques;Patient/family education;Taping    PT Next Visit Plan  [place] ES to deltoids and biceps as needed to decrease disuse atrophy; active assisted ROM gentle;  cervical ROM/stretches;  still in protective phase for clavicular 2 part fracture healing    PT Home Exercise Plan  Access Code: [email or web address]       Patient will benefit from skilled therapeutic intervention in order to improve the following deficits and impairments:  Decreased range of motion, Impaired UE functional use, Decreased activity tolerance, Impaired perceived functional ability, Pain, Decreased strength  Visit Diagnosis: 1. Acute pain of right shoulder   2. Stiffness of right shoulder, not elsewhere classified   3. Muscle weakness (generalized)        Problem List Patient Active Problem List   Diagnosis Date Noted  . Rotator cuff tear, right 05/29/2019  . Closed fracture of distal clavicle 05/15/2019  . 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    5:14 PM,06/15/19 Sherol Dade PT, DPT [place] [address], [place], [place], [address] Phone: [phone]   Fax:  [phone]  Name: Cory Barnett MRN: [id number] Date of Birth: October 25, 1946

## 2019-06-20 ENCOUNTER — Other Ambulatory Visit: Payer: Self-pay

## 2019-06-20 ENCOUNTER — Ambulatory Visit: Payer: Medicare Other | Admitting: Physical Therapy

## 2019-06-20 DIAGNOSIS — M6281 Muscle weakness (generalized): Secondary | ICD-10-CM

## 2019-06-20 DIAGNOSIS — M25611 Stiffness of right shoulder, not elsewhere classified: Secondary | ICD-10-CM

## 2019-06-20 DIAGNOSIS — M25511 Pain in right shoulder: Secondary | ICD-10-CM

## 2019-06-20 NOTE — Patient Instructions (Addendum)
  Trigger Point Dry Needling  . What is Trigger Point Dry Needling (DN)? o DN is a physical therapy technique used to treat muscle pain and dysfunction. Specifically, DN helps deactivate muscle trigger points (muscle knots).  o A thin filiform needle is used to penetrate the skin and stimulate the underlying trigger point. The goal is for a local twitch response (LTR) to occur and for the trigger point to relax. No medication of any kind is injected during the procedure.   . What Does Trigger Point Dry Needling Feel Like?  o The procedure feels different for each individual patient. Some patients report that they do not actually feel the needle enter the skin and overall the process is not painful. Very mild bleeding may occur. However, many patients feel a deep cramping in the muscle in which the needle was inserted. This is the local twitch response.   Marland Kitchen How Will I feel after the treatment? o Soreness is normal, and the onset of soreness may not occur for a few hours. Typically this soreness does not last longer than two days.  o Bruising is uncommon, however; ice can be used to decrease any possible bruising.  o In rare cases feeling tired or nauseous after the treatment is normal. In addition, your symptoms may get worse before they get better, this period will typically not last longer than 24 hours.   . What Can I do After My Treatment? o Increase your hydration by drinking more water for the next 24 hours. o You may place ice or heat on the areas treated that have become sore, however, do not use heat on inflamed or bruised areas. Heat often brings more relief post needling. o You can continue your regular activities, but vigorous activity is not recommended initially after the treatment for 24 hours. o DN is best combined with other physical therapy such as strengthening, stretching, and other therapies.    [name] PT [facility] [address] [place], [place] [address] Phone # [phone] Fax [phone]  Access Code: [email or web address]  URL: [email or web address]  Date: 06/20/2019  Prepared by: [name]   Exercises  Supine Shoulder Flexion AAROM with Hands Clasped - 5 reps - 1 sets - 1-2x daily - 7x weekly  Supine Shoulder Flexion Overhead with [place] - 5 reps - 1 sets - 1-2x daily - 7x weekly  Seated Elbow Flexion AAROM - 10 reps - 1 sets - 1x daily - 7x weekly  Seated Isometric Shoulder Abduction - 10 reps - 1 sets - 10 hold - 1x daily - 7x weekly  Seated Isometric Shoulder Flexion - 10 reps - 1 sets - 1x daily - 7x weekly  Isometric Shoulder Extension with Ball at [place] - 10 reps - 1 sets - 10 hold - 1x daily - 7x weekly  Seated Isometric Elbow Flexion - 10 reps - 1 sets - 1x daily - 7x weekly  Seated Pronation Supination with Dumbbell - 10 reps - 2 sets - 1x daily - 7x weekly  Seated Wrist Extension with Dumbbell - 10 reps - 2 sets - 1x daily - 7x weekly  Seated Wrist Flexion with Dumbbell - 10 reps - 2 sets - 1x daily - 7x weekly

## 2019-06-20 NOTE — Therapy (Signed)
[facility] [address] [place], [place], [address] Phone: [phone]   Fax:  [phone]  Physical Therapy Treatment  Patient Details  Name: DORTHY HUSTEAD MRN: [id number] Date of Birth: 1946/04/06 Referring Provider (PT): Dr. Hulan [name]   Encounter Date: 06/20/2019  PT End of Session - 06/20/19 1549    Visit Number  5    Date for PT Re-Evaluation  08/01/19    Authorization Type  Medicare    PT Start Time  8250    PT Stop Time  1555    PT Time Calculation (min)  43 min    Activity Tolerance  Patient tolerated treatment well       Past Medical History:  Diagnosis Date  . Anxiety   . AV block, 1st degree   . Basal cell carcinoma   . Cardiac murmur   . Cataract   . Elevated PSA measurement 2018  . Hyperlipidemia   . Hypertension 2015  . 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 IFG (impaired fasting glucose) 01/2016  . RBBB (right bundle branch block)   . Rheumatic fever    age 73 or 62, in hospital for a month, out of school for a yr  . Seasonal allergies   . Squamous cell carcinoma     Past Surgical History:  Procedure Laterality Date  . NASAL SEPTUM SURGERY    . NOSE SURGERY    . SKIN CANCER EXCISION     basal and squamous cell   . TONSILLECTOMY    . VASECTOMY    . WISDOM TOOTH EXTRACTION      There were no vitals filed for this visit.  Subjective Assessment - 06/20/19 1512    Subjective  Wearing sling today b/c it's sore.  I've had more pain for a few days.  I'm discouraged.    I do PT 2x/day.  Using TENs some in the evenings.  Ice helps too.  It hurts when I'm working at the computer in right (upper trap) area.    Pertinent History  MD 8/25;  elevation within visual periphery    Diagnostic tests  x-ray fx clavicle in 2 places, U/S rotator cuff torn 60-65% torn    Patient Stated Goals  not have to have MRI or surgery but close to 100% functioning    Currently in Pain?  Yes    Pain Score  4      Pain Location  Shoulder    Pain Orientation  Right    Pain Type  Acute pain                       [facility] Adult PT Treatment/Exercise - 06/20/19 0001      Shoulder Exercises: Seated   Other Seated Exercises  3# max weight forearm pronation/supination, wrist flex/extension       Shoulder Exercises: ROM/Strengthening   Other ROM/Strengthening Exercises  UE ranger gentle forward and back on floor, 1st and 2nd step 8-10x each     Other ROM/Strengthening Exercises  seated manual resisted scapular depressions 8x      Shoulder Exercises: Isometric Strengthening   Flexion  5X5"    Flexion Limitations  seated    Other Isometric Exercises  verbal review of other isometrics       Moist Heat Therapy   Number Minutes Moist Heat  5 Minutes    Moist Heat Location  Shoulder      Manual Therapy  Soft tissue mobilization  right Upper trap and lateral deltoid        Trigger Point Dry Needling - 06/20/19 0001    Consent Given?  Yes    Education Handout Provided  Yes    Muscles Treated Head and Neck  Upper trapezius    Muscles Treated Upper Quadrant  Deltoid    Dry Needling Comments  right     Upper Trapezius Response  Twitch reponse elicited;Palpable increased muscle length    Deltoid Response  Twitch response elicited;Palpable increased muscle length           PT Education - 06/20/19 1548    Education Details  Access Code: [email or web address]  wrist ex's;  dry needling    Person(s) Educated  Patient    Methods  Explanation;Demonstration;Handout    Comprehension  Returned demonstration;Verbalized understanding       PT Short Term Goals - 06/06/19 1953      PT SHORT TERM GOAL #1   Title  The patient will demonstrate compliance with initial HEP for ROM to promote fracture healing    Time  4    Period  Weeks    Status  New    Target Date  07/04/19      PT SHORT TERM GOAL #2   Title  The patient will report a 30% improvement in shoulder pain with grooming/dressing tasks     Time  4    Period  Weeks    Status  New      PT SHORT TERM GOAL #3   Title  Right shoulder active or active assisted elevation to 90 degrees needed for home and work ADLs    Time  4    Period  Weeks    Status  New      PT SHORT TERM GOAL #4   Title  FOTO functional outcome score improved from 66% limitation to 55%    Time  4    Period  Weeks    Status  New        PT Long Term Goals - 06/06/19 1955      PT LONG TERM GOAL #1   Title  The patient will be independent in safe self progression of HEP  for further improvements in ROM and strength    Time  8    Period  Weeks    Status  New    Target Date  08/01/19      PT LONG TERM GOAL #2   Title  The patient will have improved right shoulder elevation to 120 degrees needed for reaching eye level shelves and personal care    Time  8    Period  Weeks    Status  New      PT LONG TERM GOAL #3   Title  The patient will have improved pain with  usual ADLs by at least 60%    Time  8    Period  Weeks    Status  New      PT LONG TERM GOAL #4   Title  FOTO functional outcome score improved from 66% limitation to 36% indicating improved function with less pain    Time  8    Period  Weeks    Status  New      PT LONG TERM GOAL #5   Title  The patient will have grossly 3+/5 to 4-/5 strength in deltoids and biceps needed for light lifting needed at home and work  Time  8    Period  Weeks    Status  New            Plan - 06/20/19 1543    Clinical Impression Statement  Although the patient reports feeling discouraged, he is progressing as expected s/p 2 part clavicular fracture for 6 weeks post injury.  He is still in the protective phase of healing and will continue very low level ROM and gentle mobility until he has further radiographic imaging to determine status of bone healing.  He has tender points and muscle tightness/spasm of right upper trap most likely for compensation for lack of shoulder ROM and from positioning at  work and while in the sling.  Good initial response to DN and manual therapy for soft tissue work.  Therapist closely monitoring response with all treatment interventions.    Rehab Potential  Good    PT Frequency  2x / week    PT Duration  8 weeks    PT Treatment/Interventions  ADLs/Self Care Home Management;Electrical Stimulation;Cryotherapy;Moist Heat;Therapeutic activities;Therapeutic exercise;Neuromuscular re-education;Manual techniques;Patient/family education;Taping    PT Next Visit Plan  assess response to DN;  [place] ES to deltoids and biceps as needed to decrease disuse atrophy; active assisted ROM gentle;  cervical ROM/stretches;  still in protective phase for clavicular 2 part fracture healing    PT Home Exercise Plan  Access Code: [email or web address]       Patient will benefit from skilled therapeutic intervention in order to improve the following deficits and impairments:  Decreased range of motion, Impaired UE functional use, Decreased activity tolerance, Impaired perceived functional ability, Pain, Decreased strength  Visit Diagnosis: 1. Acute pain of right shoulder   2. Stiffness of right shoulder, not elsewhere classified   3. Muscle weakness (generalized)        Problem List Patient Active Problem List   Diagnosis Date Noted  . Rotator cuff tear, right 05/29/2019  . Closed fracture of distal clavicle 05/15/2019  . 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, [place], [place], [address] Phone: [phone]   Fax:  [phone]  Name: GERON MULFORD MRN: [id number] Date of Birth: 01/02/1946

## 2019-06-22 ENCOUNTER — Ambulatory Visit: Payer: Medicare Other | Admitting: Physical Therapy

## 2019-06-22 ENCOUNTER — Other Ambulatory Visit: Payer: Self-pay

## 2019-06-22 DIAGNOSIS — M6281 Muscle weakness (generalized): Secondary | ICD-10-CM

## 2019-06-22 DIAGNOSIS — M25511 Pain in right shoulder: Secondary | ICD-10-CM | POA: Diagnosis not present

## 2019-06-22 DIAGNOSIS — M25611 Stiffness of right shoulder, not elsewhere classified: Secondary | ICD-10-CM | POA: Diagnosis not present

## 2019-06-22 NOTE — Therapy (Signed)
[facility] [address] [place], [place], [address] Phone: [phone]   Fax:  [phone]  Physical Therapy Treatment  Patient Details  Name: Cory Barnett MRN: [id number] Date of Birth: 10-11-1946 Referring Provider (PT): Dr. Hulan [name]   Encounter Date: 06/22/2019  PT End of Session - 06/22/19 1550    Visit Number  6    Date for PT Re-Evaluation  08/01/19    Authorization Type  Medicare    PT Start Time  1514    PT Stop Time  1552    PT Time Calculation (min)  38 min    Activity Tolerance  Patient limited by pain       Past Medical History:  Diagnosis Date  . Anxiety   . AV block, 1st degree   . Basal cell carcinoma   . Cardiac murmur   . Cataract   . Elevated PSA measurement 2018  . Hyperlipidemia   . Hypertension 2015  . 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 IFG (impaired fasting glucose) 01/2016  . RBBB (right bundle branch block)   . Rheumatic fever    age 12 or 86, in hospital for a month, out of school for a yr  . Seasonal allergies   . Squamous cell carcinoma     Past Surgical History:  Procedure Laterality Date  . NASAL SEPTUM SURGERY    . NOSE SURGERY    . SKIN CANCER EXCISION     basal and squamous cell   . TONSILLECTOMY    . VASECTOMY    . WISDOM TOOTH EXTRACTION      There were no vitals filed for this visit.  Subjective Assessment - 06/22/19 1511    Subjective  No sling today.  I eased back on the PT and that has helped.  I used the TENS for 2.5 hours and that really helped.  Reports some improvement with DN to upper trap last visit.    Pertinent History  MD 8/25;  elevation within visual periphery    Diagnostic tests  x-ray fx clavicle in 2 places, U/S rotator cuff torn 60-65% torn    Patient Stated Goals  not have to have MRI or surgery but close to 100% functioning    Currently in Pain?  Yes    Pain Score  2     Pain Location  Shoulder    Pain Orientation  Right                        [facility] Adult PT Treatment/Exercise - 06/22/19 0001      Shoulder Exercises: Seated   Other Seated Exercises  triceps extension 15x yellow band     Other Seated Exercises  biceps yellow band 15#       Shoulder Exercises: ROM/Strengthening   Other ROM/Strengthening Exercises  UE ranger gentle forward and back on floor, 1st and 2nd step 8-10x each     Other ROM/Strengthening Exercises  seated ball roll forward small range 2x 10       Shoulder Exercises: Isometric Strengthening   Flexion  5X5"    Flexion Limitations  ball wall     Extension  5X5"    Extension Limitations  ball on wall     ABduction  5X5"    ABduction Limitations  ball on wall       Electrical Stimulation   Electrical Stimulation Location  right anterior, middle, posterior deltoid and  middle trap     Education administrator Parameters  10/10 12 min seated     Electrical Stimulation Goals  Neuromuscular facilitation               PT Short Term Goals - 06/06/19 1953      PT SHORT TERM GOAL #1   Title  The patient will demonstrate compliance with initial HEP for ROM to promote fracture healing    Time  4    Period  Weeks    Status  New    Target Date  07/04/19      PT SHORT TERM GOAL #2   Title  The patient will report a 30% improvement in shoulder pain with grooming/dressing tasks    Time  4    Period  Weeks    Status  New      PT SHORT TERM GOAL #3   Title  Right shoulder active or active assisted elevation to 90 degrees needed for home and work ADLs    Time  4    Period  Weeks    Status  New      PT SHORT TERM GOAL #4   Title  FOTO functional outcome score improved from 66% limitation to 55%    Time  4    Period  Weeks    Status  New        PT Long Term Goals - 06/06/19 1955      PT LONG TERM GOAL #1   Title  The patient will be independent in safe self progression of HEP  for further improvements in ROM and  strength    Time  8    Period  Weeks    Status  New    Target Date  08/01/19      PT LONG TERM GOAL #2   Title  The patient will have improved right shoulder elevation to 120 degrees needed for reaching eye level shelves and personal care    Time  8    Period  Weeks    Status  New      PT LONG TERM GOAL #3   Title  The patient will have improved pain with  usual ADLs by at least 60%    Time  8    Period  Weeks    Status  New      PT LONG TERM GOAL #4   Title  FOTO functional outcome score improved from 66% limitation to 36% indicating improved function with less pain    Time  8    Period  Weeks    Status  New      PT LONG TERM GOAL #5   Title  The patient will have grossly 3+/5 to 4-/5 strength in deltoids and biceps needed for light lifting needed at home and work    Time  [place] - 06/22/19 1551    Clinical Impression Statement  The patient continues to be painful with any right UE movement.  Patient instructed to do only 20% effort with isometrics.  Patient also advised that pain level should be < 6/10 and that we would not continue any exercise that was above that level.  He does not tolerate passive ROM very well but does fairly well with active assistive ROM in  supine to 100 degrees.  In sitting/standing flexion ROM 20-30 degrees.  Horizontal abduction/adduction is painful.  Therapist closely monitoring response and modifying for pain as needed.  Will await MD follow up regarding progression of PT based on healing.    Comorbidities  osteoarthritis knees, shoulder;  HTN    Examination-Activity Limitations  Reach Overhead;Self Feeding;Dressing;Lift;Carry    Rehab Potential  Good    PT Frequency  2x / week    PT Duration  8 weeks    PT Treatment/Interventions  ADLs/Self Care Home Management;Electrical Stimulation;Cryotherapy;Moist Heat;Therapeutic activities;Therapeutic exercise;Neuromuscular re-education;Manual  techniques;Patient/family education;Taping    PT Next Visit Plan  see how MD appt went;   [place] ES to deltoids and biceps as needed to decrease disuse atrophy; active assisted ROM gentle;  cervical ROM/stretches;  still in protective phase for clavicular 2 part fracture healing    PT Home Exercise Plan  Access Code: [email or web address]       Patient will benefit from skilled therapeutic intervention in order to improve the following deficits and impairments:  Decreased range of motion, Impaired UE functional use, Decreased activity tolerance, Impaired perceived functional ability, Pain, Decreased strength  Visit Diagnosis: Acute pain of right shoulder  Stiffness of right shoulder, not elsewhere classified  Muscle weakness (generalized)     Problem List Patient Active Problem List   Diagnosis Date Noted  . Rotator cuff tear, right 05/29/2019  . Closed fracture of distal clavicle 05/15/2019  . 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, [place], [place], [address] Phone: [phone]   Fax:  [phone]  Name: Cory Barnett MRN: [id number] Date of Birth: 1945/12/09

## 2019-06-27 ENCOUNTER — Ambulatory Visit (INDEPENDENT_AMBULATORY_CARE_PROVIDER_SITE_OTHER): Payer: Medicare Other | Admitting: Family Medicine

## 2019-06-27 ENCOUNTER — Ambulatory Visit: Payer: Medicare Other | Admitting: Physical Therapy

## 2019-06-27 ENCOUNTER — Other Ambulatory Visit: Payer: Self-pay

## 2019-06-27 ENCOUNTER — Encounter: Payer: Self-pay | Admitting: Family Medicine

## 2019-06-27 ENCOUNTER — Encounter: Payer: Self-pay | Admitting: Physical Therapy

## 2019-06-27 ENCOUNTER — Ambulatory Visit: Payer: Self-pay

## 2019-06-27 VITALS — BP 124/82 | HR 73 | Ht 74.0 in | Wt 211.0 lb

## 2019-06-27 DIAGNOSIS — M25511 Pain in right shoulder: Secondary | ICD-10-CM

## 2019-06-27 DIAGNOSIS — S46011A Strain of muscle(s) and tendon(s) of the rotator cuff of right shoulder, initial encounter: Secondary | ICD-10-CM | POA: Diagnosis not present

## 2019-06-27 DIAGNOSIS — M6281 Muscle weakness (generalized): Secondary | ICD-10-CM

## 2019-06-27 DIAGNOSIS — M25611 Stiffness of right shoulder, not elsewhere classified: Secondary | ICD-10-CM

## 2019-06-27 DIAGNOSIS — G8929 Other chronic pain: Secondary | ICD-10-CM | POA: Diagnosis not present

## 2019-06-27 NOTE — Therapy (Signed)
[facility] [address] [place], [place], [address] Phone: [phone]   Fax:  [phone]  Physical Therapy Treatment  Patient Details  Name: Cory Barnett MRN: [id number] Date of Birth: Apr 17, 1946 Referring Provider (PT): Dr. Hulan [name]   Encounter Date: 06/27/2019  PT End of Session - 06/27/19 1554    Visit Number  7    Date for PT Re-Evaluation  08/01/19    Authorization Type  Medicare    PT Start Time  1519    PT Stop Time  1603    PT Time Calculation (min)  44 min    Activity Tolerance  Patient limited by pain       Past Medical History:  Diagnosis Date  . Anxiety   . AV block, 1st degree   . Basal cell carcinoma   . Cardiac murmur   . Cataract   . Elevated PSA measurement 2018  . Hyperlipidemia   . Hypertension 2015  . 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 IFG (impaired fasting glucose) 01/2016  . RBBB (right bundle branch block)   . Rheumatic fever    age 29 or 50, in hospital for a month, out of school for a yr  . Seasonal allergies   . Squamous cell carcinoma     Past Surgical History:  Procedure Laterality Date  . NASAL SEPTUM SURGERY    . NOSE SURGERY    . SKIN CANCER EXCISION     basal and squamous cell   . TONSILLECTOMY    . VASECTOMY    . WISDOM TOOTH EXTRACTION      There were no vitals filed for this visit.  Subjective Assessment - 06/27/19 1517    Subjective  Saw Dr. Tamala Julian today.  Did another [place] and he was surprised by how much swelling was still around the biceps attachment.  He debated doing an injection but decided to recommend MRI.    Pertinent History  elevation within visual periphery    Diagnostic tests  x-ray fx clavicle in 2 places, U/S rotator cuff torn 60-65% torn    Patient Stated Goals  not have to have MRI or surgery but close to 100% functioning    Currently in Pain?  Yes    Pain Orientation  Right         [facility] PT Assessment - 06/27/19 0001      AROM   Right Shoulder Flexion  50 Degrees   active assisted 140 degrees    Right Shoulder ABduction  88 Degrees                   [facility] Adult PT Treatment/Exercise - 06/27/19 0001      Self-Care   Other Self-Care Comments   discussion of healing, rotator cuff tear prognosis;  goals of care       Shoulder Exercises: Seated   Other Seated Exercises  review of isometrics and biceps HEP       Shoulder Exercises: Standing   Other Standing Exercises  wall push ups 8x discontinued secondary to pain/ popping sensation       Shoulder Exercises: Pulleys   Flexion  2 minutes      Shoulder Exercises: ROM/Strengthening   Other ROM/Strengthening Exercises  UE ranger gentle forward and back on floor, 1st and 2nd step 8-10x each     Other ROM/Strengthening Exercises  seated ball roll forward small range 2x 10  Vasopneumatic   Number Minutes Vasopneumatic   15 minutes    Vasopnuematic Location   Shoulder    Vasopneumatic Pressure  Low    Vasopneumatic Temperature   coldest setting       Manual Therapy   Kinesiotex  Facilitate Muscle      Kinesiotix   Facilitate Muscle   2 vertical strips anterior and posterior deltoid and  1 strip horizontal distal deltoid                PT Short Term Goals - 06/06/19 1953      PT SHORT TERM GOAL #1   Title  The patient will demonstrate compliance with initial HEP for ROM to promote fracture healing    Time  4    Period  Weeks    Status  New    Target Date  07/04/19      PT SHORT TERM GOAL #2   Title  The patient will report a 30% improvement in shoulder pain with grooming/dressing tasks    Time  4    Period  Weeks    Status  New      PT SHORT TERM GOAL #3   Title  Right shoulder active or active assisted elevation to 90 degrees needed for home and work ADLs    Time  4    Period  Weeks    Status  New      PT SHORT TERM GOAL #4   Title  FOTO functional outcome score improved from 66% limitation to 55%    Time  4    Period   Weeks    Status  New        PT Long Term Goals - 06/06/19 1955      PT LONG TERM GOAL #1   Title  The patient will be independent in safe self progression of HEP  for further improvements in ROM and strength    Time  8    Period  Weeks    Status  New    Target Date  08/01/19      PT LONG TERM GOAL #2   Title  The patient will have improved right shoulder elevation to 120 degrees needed for reaching eye level shelves and personal care    Time  8    Period  Weeks    Status  New      PT LONG TERM GOAL #3   Title  The patient will have improved pain with  usual ADLs by at least 60%    Time  8    Period  Weeks    Status  New      PT LONG TERM GOAL #4   Title  FOTO functional outcome score improved from 66% limitation to 36% indicating improved function with less pain    Time  8    Period  Weeks    Status  New      PT LONG TERM GOAL #5   Title  The patient will have grossly 3+/5 to 4-/5 strength in deltoids and biceps needed for light lifting needed at home and work    Time  [place] - 06/27/19 1530    Clinical Impression Statement  The patient continues to be painful especially in flexion with 90-120 degrees painful arc.  Pain is anterior glenohumeral joint rather  than clavicular region.  Improving active assisted ROM but active ROM against gravity still very limited.  Initiated vascompression cryotherapy to decrease inflammation and pain.  KT tape initiated for glenohumeral support.  Therapist very closely monitoring patient's facial expressions since he does not always verbally express his pain.  Therapist continuing to recommend ROM with pain <5/10 and limiting ex which produces popping sensation.  Patient awaiting MRI.    Comorbidities  osteoarthritis knees, shoulder;  HTN    Examination-Activity Limitations  Reach Overhead;Self Feeding;Dressing;Lift;Carry    Rehab Potential  Good    PT Frequency  2x / week    PT Duration  8  weeks    PT Next Visit Plan  assess response to vascompression and KT;  low level ROM;  try yellow band rows and extensions; try  supine bent arm raises    PT Home Exercise Plan  Access Code: [place]       Patient will benefit from skilled therapeutic intervention in order to improve the following deficits and impairments:  Decreased range of motion, Impaired UE functional use, Decreased activity tolerance, Impaired perceived functional ability, Pain, Decreased strength  Visit Diagnosis: Acute pain of right shoulder  Stiffness of right shoulder, not elsewhere classified  Muscle weakness (generalized)     Problem List Patient Active Problem List   Diagnosis Date Noted  . Rotator cuff tear, right 05/29/2019  . Closed fracture of distal clavicle 05/15/2019  . 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   [name], PT 06/27/19 5:13 PM Phone: [phone] Fax: [phone] [name] 06/27/2019, 5:12 PM  [facility] [address] [place], [place], [address] Phone: [phone]   Fax:  [phone]  Name: Cory Barnett MRN: [id number] Date of Birth: 1946/09/13

## 2019-06-27 NOTE — Assessment & Plan Note (Signed)
Worsening. Patient is increasingly range of motion but still has some weakness.  On ultrasound today seems to have more retractions noted.  MRI have been ordered for further evaluation and I do believe that patient will need surgical intervention.  Patient has Ativan and will be looking into the open MRI secondary to claustrophobia.  Depending on results we will discuss surgical management versus possible PRP.  Patient is in agreement with the plan

## 2019-06-27 NOTE — Patient Instructions (Signed)
MRI at [facility] 30 minutes prior Will write in MyChart once we get result Up to you if you'd like to continue PT

## 2019-06-27 NOTE — Addendum Note (Signed)
Addended by: Douglass Rivers T on: 06/27/2019 02:41 PM   Modules accepted: Orders

## 2019-06-27 NOTE — Progress Notes (Signed)
[name] [place], [place] [address] Phone: [phone] Subjective:   Cory Barnett, am serving as a scribe for Dr. Hulan [name].  I'm seeing this patient by the request  of:    CC: Right shoulder pain follow-up  [id number]   05/29/2019 Now that patient's inflammation has decreased and is able to see on ultrasound that there is likely a rotator cuff tear noted.  Patient wants to start with formal physical therapy, icing regimen, we discussed topical anti-inflammatories, discussed which activities of doing which wants to avoid.  Patient is to increase activity slowly over the course the next several days.  Follow-up again 4 to 8 weeks.  06/27/2019 Cory Barnett is a 73 y.o. male coming in with complaint of right shoulder pain.  Was seen before and did have known acromioclavicular arthritis that seemed to have been worsening secondary to trauma and a close fracture of the distal clavicle.  Once patient was feeling better increased range of motion but was found to have a rotator cuff tear.  Has been doing formal physical therapy, icing regimen, topical anti-inflammatories, and patient states that he has had some improvement. Is able to only move arm into flexion to about 60 degrees. Feels physical therapy has been helping. Has tried to make modifications to his workday in order to not increase his pain.      Past Medical History:  Diagnosis Date  . Anxiety   . AV block, 1st degree   . Basal cell carcinoma   . Cardiac murmur   . Cataract   . Elevated PSA measurement 2018  . Hyperlipidemia   . Hypertension 2015  . 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 IFG (impaired fasting glucose) 01/2016  . RBBB (right bundle branch block)   . Rheumatic fever    age 57 or 64, in hospital for a month, out of school for a yr  . Seasonal allergies   . Squamous cell carcinoma    Past Surgical History:  Procedure Laterality Date  . NASAL SEPTUM SURGERY    .  NOSE SURGERY    . SKIN CANCER EXCISION     basal and squamous cell   . TONSILLECTOMY    . VASECTOMY    . WISDOM TOOTH EXTRACTION     Social History   Socioeconomic History  . Marital status: Married    Spouse name: Not on file  . Number of children: 3  . Years of education: Not on file  . Highest education level: Not on file  Occupational History    Comment: Retired  Scientific laboratory technician  . Financial resource strain: Not on file  . Food insecurity    Worry: Not on file    Inability: Not on file  . Transportation needs    Medical: Not on file    Non-medical: Not on file  Tobacco Use  . Smoking status: Former Research scientist (life sciences)  . Smokeless tobacco: Never Used  Substance and Sexual Activity  . Alcohol use: Yes    Alcohol/week: 2.0 standard drinks    Types: 2 Cans of beer per week  . Drug use: Never  . Sexual activity: Not on file  Lifestyle  . Physical activity    Days per week: Not on file    Minutes per session: Not on file  . Stress: Not on file  Relationships  . Social Herbalist on phone: Not on file    Gets  together: Not on file    Attends religious service: Not on file    Active member of club or organization: Not on file    Attends meetings of clubs or organizations: Not on file    Relationship status: Not on file  Other Topics Concern  . Not on file  Social History Narrative  . Not on file   Allergies  Allergen Reactions  . Moxifloxacin Shortness Of Breath   Family History  Problem Relation Age of Onset  . Stroke Mother   . Arthritis Mother   . Cancer Mother        unknown   . Dementia Mother   . Arthritis Father   . Mesothelioma Father   . Arthritis Sister   . Arthritis Sister   . Bipolar disorder Sister       Current Outpatient Medications (Cardiovascular):  .  losartan (COZAAR) 100 MG tablet, Take 1 tablet (100 mg total) by mouth daily. .  rosuvastatin (CRESTOR) 10 MG tablet, Take 10 mg by mouth daily. .  tadalafil (CIALIS) 10 MG tablet, Take  1 tablet (10 mg total) by mouth daily as needed for erectile dysfunction.   Current Outpatient Medications (Respiratory):  .  azelastine (ASTELIN) 0.1 % nasal spray, Place 1 spray into both nostrils 2 (two) times daily. Use in each nostril as directed (Patient taking differently: Place 1 spray into both nostrils as needed. Use in each nostril as directed) .  Loratadine (CLARITIN) 10 MG CAPS, Take 10 mg by mouth as needed.        Current Outpatient Medications (Other):  Marland Kitchen  AMBULATORY NON FORMULARY MEDICATION, Medication Name: Colon-Plus Caps  Takes 1-2 capsules by mouth daily .  Cholecalciferol (VITAMIN D-1000 MAX ST) 1000 units tablet, Take 2,000 mg by mouth daily.  .  Coenzyme Q10 (CO Q 10) 100 MG CAPS, Take 100 mg by mouth every morning. .  escitalopram (LEXAPRO) 5 MG tablet, Take 10 mg by mouth daily.  .  Multiple Vitamin (MULTI-VITAMINS) TABS, Take 1 tablet by mouth daily. .  Omega-3 Fatty Acids (FISH OIL PO), Take 2 g by mouth daily. .  Probiotic Product (PROBIOTIC ADVANCED PO), Take 1 tablet by mouth daily with breakfast. .  Saw Palmetto 160 MG CAPS, Take 1,500 mg by mouth daily. .  Vitamin D, Ergocalciferol, (DRISDOL) 1.25 MG (50000 UT) CAPS capsule, Take 1 capsule (50,000 Units total) by mouth every 7 (seven) days.  Current Facility-Administered Medications (Other):  .  0.9 %  sodium chloride infusion    Past medical history, social, surgical and family history all reviewed in electronic medical record.  Barnett pertanent information unless stated regarding to the chief complaint.   Review of Systems:  Barnett headache, visual changes, nausea, vomiting, diarrhea, constipation, dizziness, abdominal pain, skin rash, fevers, chills, night sweats, weight loss, swollen lymph nodes, body aches, joint swelling, muscle aches, chest pain, shortness of breath, mood changes.   Objective  There were Barnett vitals taken for this visit. Systems examined below as of    General: Barnett apparent distress  alert and oriented x3 mood and affect normal, dressed appropriately.  HEENT: Pupils equal, extraocular movements intact  Respiratory: Patient's speak in full sentences and does not appear short of breath  Cardiovascular: Barnett lower extremity edema, non tender, Barnett erythema  Skin: Warm dry intact with Barnett signs of infection or rash on extremities or on axial skeleton.  Abdomen: Soft nontender  Neuro: Cranial nerves II through XII are intact, neurovascularly intact in  all extremities with 2+ DTRs and 2+ pulses.  Lymph: Barnett lymphadenopathy of posterior or anterior cervical chain or axillae bilaterally.  Gait normal with good balance and coordination.  MSK:  Shoulder: right  Right shoulder exam does have some atrophy noted in the high riding humeral head.  Rotator cuff strength is 5 out of 5 with only flexion and 20 degrees of the shoulder both active patient can get to 80 degrees of forward flexion.  Positive painful arc.  Positive crossover and [place]   MSK US performed and interpreted by This study was ordered, performed, and interpreted by Charlann Boxer D.O.  Shoulder:  Patient does have now what appears to be significant retraction of the supraspinatus.  Difficult to assess secondary to the hypoechoic changes, increasing hypoechoic changes within the tendon sheath of the bicep tendon. Impression: Worsening rotator cuff tear      Impression and Recommendations:     This case required medical decision making of moderate complexity. The above documentation has been reviewed and is accurate and complete Lyndal Pulley, DO       Note: This dictation was prepared with Dragon dictation along with smaller phrase technology. Any transcriptional errors that result from this process are unintentional.

## 2019-06-29 ENCOUNTER — Encounter: Payer: Self-pay | Admitting: Family Medicine

## 2019-06-29 ENCOUNTER — Other Ambulatory Visit: Payer: Self-pay

## 2019-06-29 ENCOUNTER — Other Ambulatory Visit: Payer: Self-pay | Admitting: *Deleted

## 2019-06-29 ENCOUNTER — Ambulatory Visit: Payer: Medicare Other | Admitting: Physical Therapy

## 2019-06-29 DIAGNOSIS — M25611 Stiffness of right shoulder, not elsewhere classified: Secondary | ICD-10-CM

## 2019-06-29 DIAGNOSIS — M25511 Pain in right shoulder: Secondary | ICD-10-CM | POA: Diagnosis not present

## 2019-06-29 DIAGNOSIS — M6281 Muscle weakness (generalized): Secondary | ICD-10-CM | POA: Diagnosis not present

## 2019-06-29 DIAGNOSIS — M75101 Unspecified rotator cuff tear or rupture of right shoulder, not specified as traumatic: Secondary | ICD-10-CM

## 2019-06-29 NOTE — Therapy (Signed)
[facility] [address] [place], [place], [address] Phone: [phone]   Fax:  [phone]  Physical Therapy Treatment  Patient Details  Name: Cory Barnett MRN: [id number] Date of Birth: [birth year] Referring Provider (PT): Dr. Hulan [name]   Encounter Date: 06/29/2019  PT End of Session - 06/29/19 1548    Visit Number  8    Date for PT Re-Evaluation  08/01/19    Authorization Type  Medicare    PT Start Time  1510    PT Stop Time  1600    PT Time Calculation (min)  50 min    Activity Tolerance  Patient tolerated treatment well       Past Medical History:  Diagnosis Date  . Anxiety   . AV block, 1st degree   . Basal cell carcinoma   . Cardiac murmur   . Cataract   . Elevated PSA measurement 2018  . Hyperlipidemia   . Hypertension 2015  . 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 IFG (impaired fasting glucose) 01/2016  . RBBB (right bundle branch block)   . Rheumatic fever    age 46 or 21, in hospital for a month, out of school for a yr  . Seasonal allergies   . Squamous cell carcinoma     Past Surgical History:  Procedure Laterality Date  . NASAL SEPTUM SURGERY    . NOSE SURGERY    . SKIN CANCER EXCISION     basal and squamous cell   . TONSILLECTOMY    . VASECTOMY    . WISDOM TOOTH EXTRACTION      There were no vitals filed for this visit.  Subjective Assessment - 06/29/19 1512    Subjective  MRI next Friday.  The vasompression and taping helped.  Using the TENs unit.    Currently in Pain?  Yes    Pain Score  2     Pain Location  Shoulder    Pain Orientation  Right    Pain Type  Acute pain                       [facility] Adult PT Treatment/Exercise - 06/29/19 0001      Shoulder Exercises: Supine   Protraction  --   1x too painful    Other Supine Exercises  assisted bent elbow elevation, straighten elbow, bent arm lower 7x    intermittently painful      Shoulder Exercises: Prone    Extension  AROM;Right;20 reps    Extension Weight (lbs)  0    Other Prone Exercises  row 2x 10       Shoulder Exercises: Standing   Extension  Strengthening;Right;20 reps;Theraband    Theraband Level (Shoulder Extension)  Level 2 (Red)    Other Standing Exercises  red band triceps 20x     Other Standing Exercises  biceps 3# 20x      Vasopneumatic   Number Minutes Vasopneumatic   15 minutes    Vasopnuematic Location   Shoulder    Vasopneumatic Pressure  Low   attempted medium level but uncomfortable    Vasopneumatic Temperature   coldest setting                PT Short Term Goals - 06/06/19 1953      PT SHORT TERM GOAL #1   Title  The patient will demonstrate compliance with initial HEP for ROM to promote fracture healing  Time  4    Period  Weeks    Status  New    Target Date  07/04/19      PT SHORT TERM GOAL #2   Title  The patient will report a 30% improvement in shoulder pain with grooming/dressing tasks    Time  4    Period  Weeks    Status  New      PT SHORT TERM GOAL #3   Title  Right shoulder active or active assisted elevation to 90 degrees needed for home and work ADLs    Time  4    Period  Weeks    Status  New      PT SHORT TERM GOAL #4   Title  FOTO functional outcome score improved from 66% limitation to 55%    Time  4    Period  Weeks    Status  New        PT Long Term Goals - 06/06/19 1955      PT LONG TERM GOAL #1   Title  The patient will be independent in safe self progression of HEP  for further improvements in ROM and strength    Time  8    Period  Weeks    Status  New    Target Date  08/01/19      PT LONG TERM GOAL #2   Title  The patient will have improved right shoulder elevation to 120 degrees needed for reaching eye level shelves and personal care    Time  8    Period  Weeks    Status  New      PT LONG TERM GOAL #3   Title  The patient will have improved pain with  usual ADLs by at least 60%    Time  8    Period   Weeks    Status  New      PT LONG TERM GOAL #4   Title  FOTO functional outcome score improved from 66% limitation to 36% indicating improved function with less pain    Time  8    Period  Weeks    Status  New      PT LONG TERM GOAL #5   Title  The patient will have grossly 3+/5 to 4-/5 strength in deltoids and biceps needed for light lifting needed at home and work    Time  [place] - 06/29/19 1548    Clinical Impression Statement  Patient is able to tolerate low level scapular exercise in prone and standing with muscle fatigue reported but no increase in shoulder pain.  Protraction painful so discontinued.  Bent arm elevation in supine is painful intermittently, more comfortable with therapist assist.  Therapist closely monitoring response and modifying accordingly.  Will follow up after MRI.    Personal Factors and Comorbidities  Age;Comorbidity 1;Comorbidity 2    Comorbidities  osteoarthritis knees, shoulder;  HTN    Rehab Potential  Good    PT Frequency  2x / week    PT Duration  8 weeks    PT Treatment/Interventions  ADLs/Self Care Home Management;Electrical Stimulation;Cryotherapy;Moist Heat;Therapeutic activities;Therapeutic exercise;Neuromuscular re-education;Manual techniques;Patient/family education;Taping    PT Next Visit Plan  follow up after MRI;  vascompression, KT;  low level ROM;  yellow band rows and extensions; supine bent arm raises as  tolerated    PT Home Exercise Plan  Access Code: [email or web address]       Patient will benefit from skilled therapeutic intervention in order to improve the following deficits and impairments:  Decreased range of motion, Impaired UE functional use, Decreased activity tolerance, Impaired perceived functional ability, Pain, Decreased strength  Visit Diagnosis: Acute pain of right shoulder  Stiffness of right shoulder, not elsewhere classified  Muscle weakness (generalized)     Problem  List Patient Active Problem List   Diagnosis Date Noted  . Rotator cuff tear, right 05/29/2019  . Closed fracture of distal clavicle 05/15/2019  . 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 [place], [place], [address] Phone: [phone]   Fax:  [phone]  Name: KENZELL LARY MRN: [id number] Date of Birth: 03-18-1946

## 2019-07-04 ENCOUNTER — Ambulatory Visit: Payer: Medicare Other | Admitting: Physical Therapy

## 2019-07-06 ENCOUNTER — Encounter: Payer: Medicare Other | Admitting: Physical Therapy

## 2019-07-07 ENCOUNTER — Encounter: Payer: Self-pay | Admitting: Family Medicine

## 2019-07-07 DIAGNOSIS — S46011A Strain of muscle(s) and tendon(s) of the rotator cuff of right shoulder, initial encounter: Secondary | ICD-10-CM | POA: Diagnosis not present

## 2019-07-07 DIAGNOSIS — M62511 Muscle wasting and atrophy, not elsewhere classified, right shoulder: Secondary | ICD-10-CM | POA: Diagnosis not present

## 2019-07-07 DIAGNOSIS — M19011 Primary osteoarthritis, right shoulder: Secondary | ICD-10-CM | POA: Diagnosis not present

## 2019-07-07 DIAGNOSIS — R600 Localized edema: Secondary | ICD-10-CM | POA: Diagnosis not present

## 2019-07-07 DIAGNOSIS — S43431A Superior glenoid labrum lesion of right shoulder, initial encounter: Secondary | ICD-10-CM | POA: Diagnosis not present

## 2019-07-07 DIAGNOSIS — M25411 Effusion, right shoulder: Secondary | ICD-10-CM | POA: Diagnosis not present

## 2019-07-07 DIAGNOSIS — M67911 Unspecified disorder of synovium and tendon, right shoulder: Secondary | ICD-10-CM | POA: Diagnosis not present

## 2019-07-07 DIAGNOSIS — M898X2 Other specified disorders of bone, upper arm: Secondary | ICD-10-CM | POA: Diagnosis not present

## 2019-07-07 DIAGNOSIS — S43491A Other sprain of right shoulder joint, initial encounter: Secondary | ICD-10-CM | POA: Diagnosis not present

## 2019-07-07 DIAGNOSIS — M94211 Chondromalacia, right shoulder: Secondary | ICD-10-CM | POA: Diagnosis not present

## 2019-07-07 DIAGNOSIS — M25711 Osteophyte, right shoulder: Secondary | ICD-10-CM | POA: Diagnosis not present

## 2019-07-11 ENCOUNTER — Encounter: Payer: Medicare Other | Admitting: Physical Therapy

## 2019-07-11 ENCOUNTER — Encounter: Payer: Self-pay | Admitting: Family Medicine

## 2019-07-13 ENCOUNTER — Ambulatory Visit: Payer: Medicare Other | Attending: Family Medicine | Admitting: Physical Therapy

## 2019-07-13 ENCOUNTER — Other Ambulatory Visit: Payer: Self-pay

## 2019-07-13 ENCOUNTER — Encounter: Payer: Self-pay | Admitting: Physical Therapy

## 2019-07-13 DIAGNOSIS — M6281 Muscle weakness (generalized): Secondary | ICD-10-CM | POA: Diagnosis not present

## 2019-07-13 DIAGNOSIS — M25611 Stiffness of right shoulder, not elsewhere classified: Secondary | ICD-10-CM | POA: Insufficient documentation

## 2019-07-13 DIAGNOSIS — M12811 Other specific arthropathies, not elsewhere classified, right shoulder: Secondary | ICD-10-CM

## 2019-07-13 DIAGNOSIS — M75101 Unspecified rotator cuff tear or rupture of right shoulder, not specified as traumatic: Secondary | ICD-10-CM

## 2019-07-13 DIAGNOSIS — M25511 Pain in right shoulder: Secondary | ICD-10-CM | POA: Diagnosis not present

## 2019-07-13 NOTE — Therapy (Signed)
[facility] [address] [place], [place], [address] Phone: [phone]   Fax:  [phone]  Physical Therapy Treatment/Discharge Summary   Patient Details  Name: Cory Barnett MRN: [id number] Date of Birth: [birth year] Referring Provider (PT): Dr. Hulan [name]   Encounter Date: 07/13/2019  PT End of Session - 07/13/19 1657    Visit Number  9    Date for PT Re-Evaluation  08/01/19    Authorization Type  Medicare    PT Start Time  5188    PT Stop Time  4166   discharge visit; going to have surgery   PT Time Calculation (min)  25 min    Activity Tolerance  Patient tolerated treatment well       Past Medical History:  Diagnosis Date  . Anxiety   . AV block, 1st degree   . Basal cell carcinoma   . Cardiac murmur   . Cataract   . Elevated PSA measurement 2018  . Hyperlipidemia   . Hypertension 2015  . 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 IFG (impaired fasting glucose) 01/2016  . RBBB (right bundle branch block)   . Rheumatic fever    age 46 or 55, in hospital for a month, out of school for a yr  . Seasonal allergies   . Squamous cell carcinoma     Past Surgical History:  Procedure Laterality Date  . NASAL SEPTUM SURGERY    . NOSE SURGERY    . SKIN CANCER EXCISION     basal and squamous cell   . TONSILLECTOMY    . VASECTOMY    . WISDOM TOOTH EXTRACTION      There were no vitals filed for this visit.  Subjective Assessment - 07/13/19 1516    Subjective  I had the MRI on Friday.  I'm going to have surgery for full thickness rotator cuff tear, labral tear.    Currently in Pain?  Yes    Pain Score  2          [facility] PT Assessment - 07/13/19 0001      Observation/Other Assessments   Focus on Therapeutic Outcomes (FOTO)   51%       AROM   Overall AROM Comments  active assisted motion 145 degrees with minimal pain     Right Shoulder Flexion  62 Degrees    Right Shoulder ABduction  88 Degrees    Right  Shoulder Internal Rotation  --   T10   Right Shoulder External Rotation  18 Degrees                   [facility] Adult PT Treatment/Exercise - 07/13/19 0001      Self-Care   Other Self-Care Comments   discussion of basic post op recovery: sleeping, dressing, typical frequency of treatment and PT protocol for full thickness rotator cuff repair        Shoulder Exercises: ROM/Strengthening   Other ROM/Strengthening Exercises  review of current HEP                PT Short Term Goals - 07/13/19 1702      PT SHORT TERM GOAL #1   Title  The patient will demonstrate compliance with initial HEP for ROM to promote fracture healing    Status  Achieved      PT SHORT TERM GOAL #2   Title  The patient will report a 30% improvement in shoulder pain with  grooming/dressing tasks    Status  Achieved      PT SHORT TERM GOAL #3   Title  Right shoulder active or active assisted elevation to 90 degrees needed for home and work ADLs    Status  Partially Met      PT SHORT TERM GOAL #4   Title  FOTO functional outcome score improved from 66% limitation to 55%    Status  Achieved        PT Long Term Goals - 07/13/19 1703      PT LONG TERM GOAL #1   Title  The patient will be independent in safe self progression of HEP  for further improvements in ROM and strength    Status  Achieved      PT LONG TERM GOAL #2   Title  The patient will have improved right shoulder elevation to 120 degrees needed for reaching eye level shelves and personal care    Status  Not Met      PT LONG TERM GOAL #3   Title  The patient will have improved pain with  usual ADLs by at least 60%    Status  Not Met      PT LONG TERM GOAL #4   Title  FOTO functional outcome score improved from 66% limitation to 36% indicating improved function with less pain    Status  Not Met      PT LONG TERM GOAL #5   Title  The patient will have grossly 3+/5 to 4-/5 strength in deltoids and biceps needed for light  lifting needed at home and work    Status  Not Met            Plan - 07/13/19 1658    Clinical Impression Statement  The patient got his MRI results which indicates a full thickness tear of supraspinatus and infraspinatus, fraying of subscapularis, SLAP labral tear as well as subacromial spurring.  He has been referred to a Psychologist, sport and exercise.  We have discussed his HEP and what is appropriate as he awaits to be seen.  He also has questions regarding what to expect with his postop rehab recovery and we discussed typical restrictions and protocols.  Will discharge him from PT at this time with limited goals met secondary to severity of injury and the plan for surgical fixation needed.       Patient will benefit from skilled therapeutic intervention in order to improve the following deficits and impairments:     Visit Diagnosis: Acute pain of right shoulder  Stiffness of right shoulder, not elsewhere classified  Muscle weakness (generalized)    PHYSICAL THERAPY DISCHARGE SUMMARY  Visits from Start of Care: 9  Current functional level related to goals / functional outcomes: See clinical impressions above   Remaining deficits: As above   Education / Equipment: HEP Plan: Patient agrees to discharge.  Patient goals were partially met. Patient is being discharged due to a change in medical status.  ?????         Problem List Patient Active Problem List   Diagnosis Date Noted  . Rotator cuff tear, right 05/29/2019  . Closed fracture of distal clavicle 05/15/2019  . 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, [place], [place], [address] Phone: [phone]   Fax:  [phone]  Name: Cory Barnett MRN:  [id number] Date of Birth: May 16, 1946

## 2019-07-15 DIAGNOSIS — Z23 Encounter for immunization: Secondary | ICD-10-CM | POA: Diagnosis not present

## 2019-07-18 ENCOUNTER — Encounter: Payer: Medicare Other | Admitting: Physical Therapy

## 2019-07-20 ENCOUNTER — Encounter: Payer: Self-pay | Admitting: Family Medicine

## 2019-07-20 ENCOUNTER — Encounter: Payer: Medicare Other | Admitting: Physical Therapy

## 2019-07-26 ENCOUNTER — Telehealth: Payer: Self-pay

## 2019-07-26 DIAGNOSIS — M75121 Complete rotator cuff tear or rupture of right shoulder, not specified as traumatic: Secondary | ICD-10-CM | POA: Diagnosis not present

## 2019-07-26 NOTE — Telephone Encounter (Signed)
   [facility] Pre-operative Risk Assessment    Request for surgical clearance:  1. What type of surgery is being performed?  Right rotator Cuff Repair VS Debridement,Subacromial Decompression   2. When is this surgery scheduled? TBD  3. What type of clearance is required (medical clearance vs. Pharmacy clearance to hold med vs. Both)? Medical  4. Are there any medications that need to be held prior to surgery and how long? None  5. Practice name and name of physician performing surgery? [facility]  6. What is your office phone number [phone]   7.   What is your office fax number [phone]  8.   Anesthesia type  Not listed   Kathyrn Lass 07/26/2019, 2:57 PM  _________________________________________________________________   (provider comments below)

## 2019-07-27 NOTE — Telephone Encounter (Signed)
   Primary Cardiologist: Kirk Ruths, MD  Chart reviewed as part of pre-operative protocol coverage. Patient was contacted 07/27/2019 in reference to pre-operative risk assessment for pending surgery as outlined below.  Cory Barnett was last seen on 03/13/19 by Dr. Stanford Breed.  Since that day, Cory Barnett has done well. He reports less palpitations when he drinks less caffeine. He does not have a history of CAD, MI, or stroke. He can complete more than 4.0 METS.   Therefore, based on ACC/AHA guidelines, the patient would be at acceptable risk for the planned procedure without further cardiovascular testing - pending EKG at his preop appt with anesthesia.   I will route this recommendation to the requesting party via Epic fax function and remove from pre-op pool.  Please call with questions.  Tami Lin Duke, PA 07/27/2019, 9:29 AM

## 2019-07-31 ENCOUNTER — Other Ambulatory Visit: Payer: Self-pay | Admitting: Family Medicine

## 2019-08-02 ENCOUNTER — Other Ambulatory Visit: Payer: Self-pay | Admitting: Orthopedic Surgery

## 2019-08-02 ENCOUNTER — Other Ambulatory Visit: Payer: Self-pay

## 2019-08-02 ENCOUNTER — Encounter (HOSPITAL_BASED_OUTPATIENT_CLINIC_OR_DEPARTMENT_OTHER): Payer: Self-pay | Admitting: *Deleted

## 2019-08-03 ENCOUNTER — Encounter (HOSPITAL_BASED_OUTPATIENT_CLINIC_OR_DEPARTMENT_OTHER)
Admission: RE | Admit: 2019-08-03 | Discharge: 2019-08-03 | Disposition: A | Discharge status: Home / Self Care | Payer: Medicare Other | Source: Ambulatory Visit | Attending: Orthopedic Surgery | Admitting: Orthopedic Surgery

## 2019-08-03 ENCOUNTER — Other Ambulatory Visit (HOSPITAL_COMMUNITY)
Admission: RE | Admit: 2019-08-03 | Discharge: 2019-08-03 | Disposition: A | Discharge status: Home / Self Care | Payer: Medicare Other | Source: Ambulatory Visit | Attending: Orthopedic Surgery | Admitting: Orthopedic Surgery

## 2019-08-03 DIAGNOSIS — F419 Anxiety disorder, unspecified: Secondary | ICD-10-CM | POA: Diagnosis not present

## 2019-08-03 DIAGNOSIS — Z01812 Encounter for preprocedural laboratory examination: Secondary | ICD-10-CM | POA: Diagnosis not present

## 2019-08-03 DIAGNOSIS — Z87891 Personal history of nicotine dependence: Secondary | ICD-10-CM | POA: Diagnosis not present

## 2019-08-03 DIAGNOSIS — Z79899 Other long term (current) drug therapy: Secondary | ICD-10-CM | POA: Diagnosis not present

## 2019-08-03 DIAGNOSIS — Z85828 Personal history of other malignant neoplasm of skin: Secondary | ICD-10-CM | POA: Diagnosis not present

## 2019-08-03 DIAGNOSIS — I4510 Unspecified right bundle-branch block: Secondary | ICD-10-CM | POA: Diagnosis not present

## 2019-08-03 DIAGNOSIS — I10 Essential (primary) hypertension: Secondary | ICD-10-CM | POA: Diagnosis not present

## 2019-08-03 DIAGNOSIS — M1990 Unspecified osteoarthritis, unspecified site: Secondary | ICD-10-CM | POA: Diagnosis not present

## 2019-08-03 DIAGNOSIS — Z20828 Contact with and (suspected) exposure to other viral communicable diseases: Secondary | ICD-10-CM | POA: Diagnosis not present

## 2019-08-03 DIAGNOSIS — Z888 Allergy status to other drugs, medicaments and biological substances status: Secondary | ICD-10-CM | POA: Diagnosis not present

## 2019-08-03 DIAGNOSIS — R7303 Prediabetes: Secondary | ICD-10-CM | POA: Diagnosis not present

## 2019-08-03 DIAGNOSIS — S46011A Strain of muscle(s) and tendon(s) of the rotator cuff of right shoulder, initial encounter: Secondary | ICD-10-CM | POA: Diagnosis not present

## 2019-08-03 DIAGNOSIS — I491 Atrial premature depolarization: Secondary | ICD-10-CM | POA: Diagnosis not present

## 2019-08-03 DIAGNOSIS — E785 Hyperlipidemia, unspecified: Secondary | ICD-10-CM | POA: Diagnosis not present

## 2019-08-03 DIAGNOSIS — I440 Atrioventricular block, first degree: Secondary | ICD-10-CM | POA: Diagnosis not present

## 2019-08-03 DIAGNOSIS — W19XXXA Unspecified fall, initial encounter: Secondary | ICD-10-CM | POA: Diagnosis not present

## 2019-08-03 NOTE — Progress Notes (Signed)

## 2019-08-04 LAB — NOVEL CORONAVIRUS, NAA (HOSP ORDER, SEND-OUT TO REF LAB; TAT 18-24 HRS): SARS-CoV-2, NAA: NOT DETECTED

## 2019-08-06 ENCOUNTER — Encounter: Payer: Self-pay | Admitting: Family Medicine

## 2019-08-07 ENCOUNTER — Ambulatory Visit (HOSPITAL_BASED_OUTPATIENT_CLINIC_OR_DEPARTMENT_OTHER): Payer: Medicare Other | Admitting: Anesthesiology

## 2019-08-07 ENCOUNTER — Encounter (HOSPITAL_BASED_OUTPATIENT_CLINIC_OR_DEPARTMENT_OTHER): Payer: Self-pay | Admitting: Anesthesiology

## 2019-08-07 ENCOUNTER — Encounter (HOSPITAL_BASED_OUTPATIENT_CLINIC_OR_DEPARTMENT_OTHER)
Admission: RE | Disposition: A | Discharge status: Home / Self Care | Payer: Self-pay | Source: Home / Self Care | Attending: Orthopedic Surgery

## 2019-08-07 ENCOUNTER — Encounter: Payer: Self-pay | Admitting: Family Medicine

## 2019-08-07 ENCOUNTER — Ambulatory Visit (HOSPITAL_BASED_OUTPATIENT_CLINIC_OR_DEPARTMENT_OTHER)
Admission: RE | Admit: 2019-08-07 | Discharge: 2019-08-07 | Disposition: A | Discharge status: Home / Self Care | Payer: Medicare Other | Attending: Orthopedic Surgery | Admitting: Orthopedic Surgery

## 2019-08-07 ENCOUNTER — Other Ambulatory Visit: Payer: Self-pay

## 2019-08-07 DIAGNOSIS — S46011A Strain of muscle(s) and tendon(s) of the rotator cuff of right shoulder, initial encounter: Secondary | ICD-10-CM | POA: Insufficient documentation

## 2019-08-07 DIAGNOSIS — I440 Atrioventricular block, first degree: Secondary | ICD-10-CM | POA: Insufficient documentation

## 2019-08-07 DIAGNOSIS — Z79899 Other long term (current) drug therapy: Secondary | ICD-10-CM | POA: Insufficient documentation

## 2019-08-07 DIAGNOSIS — M1990 Unspecified osteoarthritis, unspecified site: Secondary | ICD-10-CM | POA: Insufficient documentation

## 2019-08-07 DIAGNOSIS — E785 Hyperlipidemia, unspecified: Secondary | ICD-10-CM | POA: Insufficient documentation

## 2019-08-07 DIAGNOSIS — Z87891 Personal history of nicotine dependence: Secondary | ICD-10-CM | POA: Insufficient documentation

## 2019-08-07 DIAGNOSIS — R7303 Prediabetes: Secondary | ICD-10-CM | POA: Insufficient documentation

## 2019-08-07 DIAGNOSIS — W19XXXA Unspecified fall, initial encounter: Secondary | ICD-10-CM | POA: Insufficient documentation

## 2019-08-07 DIAGNOSIS — I10 Essential (primary) hypertension: Secondary | ICD-10-CM | POA: Insufficient documentation

## 2019-08-07 DIAGNOSIS — I491 Atrial premature depolarization: Secondary | ICD-10-CM | POA: Insufficient documentation

## 2019-08-07 DIAGNOSIS — Z85828 Personal history of other malignant neoplasm of skin: Secondary | ICD-10-CM | POA: Insufficient documentation

## 2019-08-07 DIAGNOSIS — I4510 Unspecified right bundle-branch block: Secondary | ICD-10-CM | POA: Insufficient documentation

## 2019-08-07 DIAGNOSIS — F419 Anxiety disorder, unspecified: Secondary | ICD-10-CM | POA: Insufficient documentation

## 2019-08-07 DIAGNOSIS — M19019 Primary osteoarthritis, unspecified shoulder: Secondary | ICD-10-CM | POA: Diagnosis not present

## 2019-08-07 DIAGNOSIS — G8918 Other acute postprocedural pain: Secondary | ICD-10-CM | POA: Diagnosis not present

## 2019-08-07 DIAGNOSIS — M75101 Unspecified rotator cuff tear or rupture of right shoulder, not specified as traumatic: Secondary | ICD-10-CM | POA: Diagnosis not present

## 2019-08-07 DIAGNOSIS — Z888 Allergy status to other drugs, medicaments and biological substances status: Secondary | ICD-10-CM | POA: Insufficient documentation

## 2019-08-07 HISTORY — DX: Prediabetes: R73.03

## 2019-08-07 HISTORY — PX: SHOULDER ARTHROSCOPY WITH SUBACROMIAL DECOMPRESSION: SHX5684

## 2019-08-07 SURGERY — SHOULDER ARTHROSCOPY WITH SUBACROMIAL DECOMPRESSION
Anesthesia: General | Site: Shoulder | Laterality: Right

## 2019-08-07 MED ORDER — ACETAMINOPHEN 160 MG/5ML PO SOLN: 325.0000 mg | ORAL | Status: DC | PRN

## 2019-08-07 MED ORDER — CEFAZOLIN SODIUM-DEXTROSE 2-4 GM/100ML-% IV SOLN: 2.0000 g | INTRAVENOUS | Status: DC

## 2019-08-07 MED ORDER — LIDOCAINE HCL (CARDIAC) PF 100 MG/5ML IV SOSY
PREFILLED_SYRINGE | INTRAVENOUS | Status: DC | PRN
  Administered 2019-08-07: 60 mg via INTRAVENOUS

## 2019-08-07 MED ORDER — LIDOCAINE 2% (20 MG/ML) 5 ML SYRINGE
INTRAMUSCULAR | Status: AC
  Filled 2019-08-07: qty 5

## 2019-08-07 MED ORDER — MEPERIDINE HCL 25 MG/ML IJ SOLN: 6.2500 mg | INTRAMUSCULAR | Status: DC | PRN

## 2019-08-07 MED ORDER — ACETAMINOPHEN 325 MG PO TABS: 325.0000 mg | ORAL_TABLET | ORAL | Status: DC | PRN

## 2019-08-07 MED ORDER — FENTANYL CITRATE (PF) 100 MCG/2ML IJ SOLN: 25.0000 ug | INTRAMUSCULAR | Status: DC | PRN

## 2019-08-07 MED ORDER — SCOPOLAMINE 1 MG/3DAYS TD PT72: 1.0000 | MEDICATED_PATCH | Freq: Once | TRANSDERMAL | Status: DC

## 2019-08-07 MED ORDER — MIDAZOLAM HCL 2 MG/2ML IJ SOLN
1.0000 mg | INTRAMUSCULAR | Status: DC | PRN
  Administered 2019-08-07: 2 mg via INTRAVENOUS

## 2019-08-07 MED ORDER — LACTATED RINGERS IV SOLN
INTRAVENOUS | Status: DC
  Administered 2019-08-07: 13:00:00 via INTRAVENOUS

## 2019-08-07 MED ORDER — ONDANSETRON HCL 4 MG/2ML IJ SOLN: 4.0000 mg | Freq: Once | INTRAMUSCULAR | Status: DC | PRN

## 2019-08-07 MED ORDER — PROPOFOL 10 MG/ML IV BOLUS
INTRAVENOUS | Status: DC | PRN
  Administered 2019-08-07: 50 mg via INTRAVENOUS
  Administered 2019-08-07: 150 mg via INTRAVENOUS

## 2019-08-07 MED ORDER — ONDANSETRON HCL 4 MG/2ML IJ SOLN
INTRAMUSCULAR | Status: DC | PRN
  Administered 2019-08-07: 4 mg via INTRAVENOUS

## 2019-08-07 MED ORDER — FENTANYL CITRATE (PF) 100 MCG/2ML IJ SOLN
INTRAMUSCULAR | Status: AC
  Filled 2019-08-07: qty 2

## 2019-08-07 MED ORDER — PHENYLEPHRINE HCL (PRESSORS) 10 MG/ML IV SOLN
INTRAVENOUS | Status: DC | PRN
  Administered 2019-08-07 (×2): 40 ug via INTRAVENOUS

## 2019-08-07 MED ORDER — OXYCODONE HCL 5 MG PO TABS: 5.0000 mg | ORAL_TABLET | Freq: Once | ORAL | Status: DC | PRN

## 2019-08-07 MED ORDER — CHLORHEXIDINE GLUCONATE 4 % EX LIQD: 60.0000 mL | Freq: Once | CUTANEOUS | Status: DC

## 2019-08-07 MED ORDER — EPHEDRINE SULFATE 50 MG/ML IJ SOLN
INTRAMUSCULAR | Status: DC | PRN
  Administered 2019-08-07: 10 mg via INTRAVENOUS
  Administered 2019-08-07 (×3): 5 mg via INTRAVENOUS
  Administered 2019-08-07: 10 mg via INTRAVENOUS

## 2019-08-07 MED ORDER — DEXAMETHASONE SODIUM PHOSPHATE 10 MG/ML IJ SOLN
INTRAMUSCULAR | Status: AC
  Filled 2019-08-07: qty 1

## 2019-08-07 MED ORDER — SODIUM CHLORIDE 0.9 % IR SOLN
Status: DC | PRN
  Administered 2019-08-07: 6000 mL

## 2019-08-07 MED ORDER — BUPIVACAINE-EPINEPHRINE (PF) 0.5% -1:200000 IJ SOLN
INTRAMUSCULAR | Status: DC | PRN
  Administered 2019-08-07: 25 mL via PERINEURAL

## 2019-08-07 MED ORDER — DEXAMETHASONE SODIUM PHOSPHATE 4 MG/ML IJ SOLN
INTRAMUSCULAR | Status: DC | PRN
  Administered 2019-08-07: 10 mg via INTRAVENOUS

## 2019-08-07 MED ORDER — SUCCINYLCHOLINE CHLORIDE 20 MG/ML IJ SOLN
INTRAMUSCULAR | Status: DC | PRN
  Administered 2019-08-07: 100 mg via INTRAVENOUS

## 2019-08-07 MED ORDER — CLONIDINE HCL (ANALGESIA) 100 MCG/ML EP SOLN
EPIDURAL | Status: DC | PRN
  Administered 2019-08-07: 100 ug

## 2019-08-07 MED ORDER — PROPOFOL 10 MG/ML IV BOLUS
INTRAVENOUS | Status: AC
  Filled 2019-08-07: qty 20

## 2019-08-07 MED ORDER — CEFAZOLIN SODIUM-DEXTROSE 2-4 GM/100ML-% IV SOLN
INTRAVENOUS | Status: AC
  Filled 2019-08-07: qty 100

## 2019-08-07 MED ORDER — FENTANYL CITRATE (PF) 100 MCG/2ML IJ SOLN
50.0000 ug | INTRAMUSCULAR | Status: DC | PRN
  Administered 2019-08-07: 50 ug via INTRAVENOUS

## 2019-08-07 MED ORDER — ONDANSETRON HCL 4 MG/2ML IJ SOLN
INTRAMUSCULAR | Status: AC
  Filled 2019-08-07: qty 2

## 2019-08-07 MED ORDER — MIDAZOLAM HCL 2 MG/2ML IJ SOLN
INTRAMUSCULAR | Status: AC
  Filled 2019-08-07: qty 2

## 2019-08-07 MED ORDER — OXYCODONE HCL 5 MG/5ML PO SOLN: 5.0000 mg | Freq: Once | ORAL | Status: DC | PRN

## 2019-08-07 SURGICAL SUPPLY — 61 items
AID PSTN UNV HD RSTRNT DISP (MISCELLANEOUS) ×1
APL PRP STRL LF DISP 70% ISPRP (MISCELLANEOUS) ×1
BLADE CLIPPER SURG (BLADE) IMPLANT
BURR OVAL 8 FLU 4.0X13 (MISCELLANEOUS) ×2 IMPLANT
CANNULA 5.75X7 CRYSTAL CLEAR (CANNULA) ×2 IMPLANT
CANNULA 5.75X71 LONG (CANNULA) ×2 IMPLANT
CANNULA TWIST IN 8.25X7CM (CANNULA) IMPLANT
CHLORAPREP W/TINT 26 (MISCELLANEOUS) ×2 IMPLANT
COVER WAND RF STERILE (DRAPES) IMPLANT
CUTTER BONE 4.0MM X 13CM (MISCELLANEOUS) ×2 IMPLANT
DECANTER SPIKE VIAL GLASS SM (MISCELLANEOUS) IMPLANT
DRAPE IMP U-DRAPE 54X76 (DRAPES) ×2 IMPLANT
DRAPE INCISE IOBAN 66X45 STRL (DRAPES) ×2 IMPLANT
DRAPE STERI 35X30 U-POUCH (DRAPES) ×2 IMPLANT
DRAPE SURG 17X23 STRL (DRAPES) ×2 IMPLANT
DRAPE U-SHAPE 47X51 STRL (DRAPES) ×2 IMPLANT
DRAPE U-SHAPE 76X120 STRL (DRAPES) ×4 IMPLANT
DRSG PAD ABDOMINAL 8X10 ST (GAUZE/BANDAGES/DRESSINGS) ×2 IMPLANT
ELECT REM PT RETURN 9FT ADLT (ELECTROSURGICAL) ×2
ELECTRODE REM PT RTRN 9FT ADLT (ELECTROSURGICAL) ×1 IMPLANT
GAUZE 4X4 16PLY RFD (DISPOSABLE) IMPLANT
GAUZE SPONGE 4X4 12PLY STRL (GAUZE/BANDAGES/DRESSINGS) ×2 IMPLANT
GAUZE XEROFORM 1X8 LF (GAUZE/BANDAGES/DRESSINGS) ×2 IMPLANT
GLOVE BIO SURGEON STRL SZ 6.5 (GLOVE) ×1 IMPLANT
GLOVE BIO SURGEON STRL SZ7 (GLOVE) ×2 IMPLANT
GLOVE BIO SURGEON STRL SZ7.5 (GLOVE) ×2 IMPLANT
GLOVE BIOGEL PI IND STRL 7.0 (GLOVE) ×1 IMPLANT
GLOVE BIOGEL PI IND STRL 8 (GLOVE) ×1 IMPLANT
GLOVE BIOGEL PI INDICATOR 7.0 (GLOVE) ×2
GLOVE BIOGEL PI INDICATOR 8 (GLOVE) ×2
GOWN STRL REUS W/ TWL LRG LVL3 (GOWN DISPOSABLE) ×2 IMPLANT
GOWN STRL REUS W/ TWL XL LVL3 (GOWN DISPOSABLE) ×1 IMPLANT
GOWN STRL REUS W/TWL LRG LVL3 (GOWN DISPOSABLE) ×4
GOWN STRL REUS W/TWL XL LVL3 (GOWN DISPOSABLE) ×2
IV NS IRRIG 3000ML ARTHROMATIC (IV SOLUTION) ×4 IMPLANT
LASSO CRESCENT QUICKPASS (SUTURE) IMPLANT
MANIFOLD NEPTUNE II (INSTRUMENTS) ×2 IMPLANT
NDL SCORPION MULTI FIRE (NEEDLE) IMPLANT
NEEDLE SCORPION MULTI FIRE (NEEDLE) IMPLANT
NS IRRIG 1000ML POUR BTL (IV SOLUTION) IMPLANT
PACK ARTHROSCOPY DSU (CUSTOM PROCEDURE TRAY) ×2 IMPLANT
PACK BASIN DAY SURGERY FS (CUSTOM PROCEDURE TRAY) ×2 IMPLANT
PENCIL BUTTON HOLSTER BLD 10FT (ELECTRODE) IMPLANT
PROBE BIPOLAR ATHRO 135MM 90D (MISCELLANEOUS) ×2 IMPLANT
RESTRAINT HEAD UNIVERSAL NS (MISCELLANEOUS) ×2 IMPLANT
SLEEVE SCD COMPRESS KNEE MED (MISCELLANEOUS) ×2 IMPLANT
SLING ARM FOAM STRAP LRG (SOFTGOODS) ×1 IMPLANT
SUPPORT WRAP ARM LG (MISCELLANEOUS) ×1 IMPLANT
SUT BONE WAX W31G (SUTURE) IMPLANT
SUT ETHILON 3 0 PS 1 (SUTURE) ×2 IMPLANT
SUT FIBERWIRE #2 38 T-5 BLUE (SUTURE)
SUT PDS AB 0 CT 36 (SUTURE) IMPLANT
SUT TIGER TAPE 7 IN WHITE (SUTURE) IMPLANT
SUTURE FIBERWR #2 38 T-5 BLUE (SUTURE) IMPLANT
SUTURE TAPE 1.3 40 TPR END (SUTURE) IMPLANT
SUTURETAPE 1.3 40 TPR END (SUTURE)
TAPE FIBER 2MM 7IN #2 BLUE (SUTURE) IMPLANT
TOWEL GREEN STERILE FF (TOWEL DISPOSABLE) ×2 IMPLANT
TUBE CONNECTING 20X1/4 (TUBING) ×2 IMPLANT
TUBING ARTHROSCOPY IRRIG 16FT (MISCELLANEOUS) ×2 IMPLANT
WATER STERILE IRR 1000ML POUR (IV SOLUTION) ×2 IMPLANT

## 2019-08-07 NOTE — Progress Notes (Signed)
Assisted Dr. Ambrose Pancoast with right, ultrasound guided, interscalene  block. Side rails up, monitors on throughout procedure. See vital signs in flow sheet. Tolerated Procedure well.

## 2019-08-07 NOTE — Anesthesia Procedure Notes (Signed)
Procedure Name: Intubation Date/Time: 08/07/2019 3:16 PM Performed by: Maryella Shivers, CRNA Pre-anesthesia Checklist: Patient identified, Emergency Drugs available, Suction available and Patient being monitored Patient Re-evaluated:Patient Re-evaluated prior to induction Oxygen Delivery Method: Circle system utilized Preoxygenation: Pre-oxygenation with 100% oxygen Induction Type: IV induction Ventilation: Mask ventilation without difficulty Laryngoscope Size: Mac and 4 Grade View: Grade II Tube type: Oral Tube size: 8.0 mm Number of attempts: 1 Airway Equipment and Method: Stylet and Oral airway Placement Confirmation: ETT inserted through vocal cords under direct vision,  positive ETCO2 and breath sounds checked- equal and bilateral Secured at: 22 cm Tube secured with: Tape Dental Injury: Teeth and Oropharynx as per pre-operative assessment

## 2019-08-07 NOTE — H&P (Signed)
Cory Barnett is an 73 y.o. male.   Chief Complaint: R shoulder pain and dysfunction. HPI: s/p fall with massive RCT.  Indicated for surgery to try and restore function and decrease pain.  Past Medical History:  Diagnosis Date  . Anxiety   . AV block, 1st degree   . Basal cell carcinoma   . Cardiac murmur   . Cataract   . Elevated PSA measurement 2018  . Hyperlipidemia   . Hypertension 2015  . 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 IFG (impaired fasting glucose) 01/2016  . Pre-diabetes   . RBBB (right bundle branch block)   . Rheumatic fever    age 40 or 57, in hospital for a month, out of school for a yr  . Seasonal allergies   . Squamous cell carcinoma     Past Surgical History:  Procedure Laterality Date  . NASAL SEPTUM SURGERY    . NOSE SURGERY    . SKIN CANCER EXCISION     basal and squamous cell   . TONSILLECTOMY    . VASECTOMY    . WISDOM TOOTH EXTRACTION      Family History  Problem Relation Age of Onset  . Stroke Mother   . Arthritis Mother   . Cancer Mother        unknown   . Dementia Mother   . Arthritis Father   . Mesothelioma Father   . Arthritis Sister   . Arthritis Sister   . Bipolar disorder Sister    Social History:  reports that he has quit smoking. He has never used smokeless tobacco. He reports current alcohol use of about 2.0 standard drinks of alcohol per week. He reports that he does not use drugs.  Allergies:  Allergies  Allergen Reactions  . Bee Venom Anaphylaxis  . Moxifloxacin Shortness Of Breath    Medications Prior to Admission  Medication Sig Dispense Refill  . Cholecalciferol (VITAMIN D-1000 MAX ST) 1000 units tablet Take 2,000 mg by mouth daily.     . Coenzyme Q10 (CO Q 10) 100 MG CAPS Take 100 mg by mouth every morning.    . escitalopram (LEXAPRO) 5 MG tablet Take 10 mg by mouth daily.     Marland Kitchen LORazepam (ATIVAN) 1 MG tablet Take 1 mg by mouth every 8 (eight) hours as needed for anxiety.    Marland Kitchen losartan (COZAAR) 100 MG tablet Take 1  tablet (100 mg total) by mouth daily. 90 tablet 3  . Multiple Vitamin (MULTI-VITAMINS) TABS Take 1 tablet by mouth daily.    . Omega-3 Fatty Acids (FISH OIL PO) Take 2 g by mouth daily.    . Probiotic Product (PROBIOTIC ADVANCED PO) Take 1 tablet by mouth daily with breakfast.    . rosuvastatin (CRESTOR) 10 MG tablet Take 10 mg by mouth daily.    . Saw Palmetto 160 MG CAPS Take 1,500 mg by mouth daily.    . Loratadine (CLARITIN) 10 MG CAPS Take 10 mg by mouth as needed.     . tadalafil (CIALIS) 10 MG tablet Take 1 tablet (10 mg total) by mouth daily as needed for erectile dysfunction. 10 tablet 0    No results found for this or any previous visit (from the past 48 hour(s)). No results found.  Review of Systems  All other systems reviewed and are negative.   Blood pressure 117/75, pulse 70, temperature 97.9 F (36.6 C), temperature source Oral, resp. rate 16, height 6\' 2"  (1.88 m), weight  94.8 kg, SpO2 100 %. Physical Exam  Constitutional: He is oriented to person, place, and time. He appears well-developed and well-nourished.  HENT:  Head: Atraumatic.  Eyes: EOM are normal.  Cardiovascular: Intact distal pulses.  Respiratory: Effort normal.  Musculoskeletal:     Comments: R shoulder pain and weakness with RC testing.  Neurological: He is alert and oriented to person, place, and time.  Skin: Skin is warm and dry.  Psychiatric: He has a normal mood and affect.     Assessment/Plan s/p fall with massive RCT.  Indicated for surgery to try and restore function and decrease pain.  Plan R arth RCR/SAD Risks / benefits of surgery discussed Consent on chart  NPO for OR Preop antibiotics   Isabella Stalling, MD 08/07/2019, 3:05 PM

## 2019-08-07 NOTE — Transfer of Care (Signed)
Immediate Anesthesia Transfer of Care Note  Patient: GERRIT SCHREINER  Procedure(s) Performed: SHOULDER ARTHROSCOPY WITH SUBACROMIAL DECOMPRESSION (Right Shoulder)  Patient Location: PACU  Anesthesia Type:GA combined with regional for post-op pain  Level of Consciousness: sedated  Airway & Oxygen Therapy: Patient Spontanous Breathing and Patient connected to nasal cannula oxygen  Post-op Assessment: Report given to RN and Post -op Vital signs reviewed and stable  Post vital signs: Reviewed and stable  Last Vitals:  Vitals Value Taken Time  BP 124/70 08/07/19 1616  Temp    Pulse 68 08/07/19 1619  Resp 15 08/07/19 1619  SpO2 99 % 08/07/19 1619  Vitals shown include unvalidated device data.  Last Pain:  Vitals:   08/07/19 1312  TempSrc: Oral  PainSc: 0-No pain         Complications: No apparent anesthesia complications

## 2019-08-07 NOTE — Anesthesia Procedure Notes (Addendum)
Anesthesia Regional Block: Interscalene brachial plexus block   Pre-Anesthetic Checklist: ,, timeout performed, Correct Patient, Correct Site, Correct Laterality, Correct Procedure, Correct Position, site marked, Risks and benefits discussed,  Surgical consent,  Pre-op evaluation,  At surgeon's request and post-op pain management  Laterality: Right  Prep: chloraprep       Needles:  Injection technique: Single-shot  Needle Type: Echogenic Stimulator Needle     Needle Length: 5cm  Needle Gauge: 22     Additional Needles:   Procedures:, nerve stimulator,,, ultrasound used (permanent image in chart),,,,   Nerve Stimulator or Paresthesia:  Response: hand, 0.45 mA,   Additional Responses:   Narrative:  Start time: 08/07/2019 2:20 PM End time: 08/07/2019 2:25 PM Injection made incrementally with aspirations every 5 mL.  Performed by: Personally  Anesthesiologist: Duane Boston, MD  Additional Notes: Functioning IV was confirmed and monitors were applied.  A 25mm 22ga Arrow echogenic stimulator needle was used. Sterile prep and drape,hand hygiene and sterile gloves were used. Ultrasound guidance: relevant anatomy identified, needle position confirmed, local anesthetic spread visualized around nerve(s)., vascular puncture avoided.  Image printed for medical record. Negative aspiration and negative test dose prior to incremental administration of local anesthetic. The patient tolerated the procedure well.

## 2019-08-07 NOTE — Anesthesia Preprocedure Evaluation (Addendum)
Anesthesia Evaluation  Patient identified by MRN, date of birth, ID band Patient awake    Reviewed: Allergy & Precautions, H&P , NPO status , Patient's Chart, lab work & pertinent test results, reviewed documented beta blocker date and time   Airway Mallampati: II  TM Distance: >3 FB Neck ROM: full    Dental no notable dental hx.    Pulmonary neg pulmonary ROS, former smoker,    Pulmonary exam normal breath sounds clear to auscultation       Cardiovascular Exercise Tolerance: Good hypertension, + dysrhythmias  Rhythm:regular Rate:Normal  EKG 10/20 Sinus rhythm with 1st degree A-V block with Premature supraventricular complexes Right bundle branch block   Neuro/Psych negative neurological ROS  negative psych ROS   GI/Hepatic negative GI ROS, Neg liver ROS,   Endo/Other  negative endocrine ROS  Renal/GU negative Renal ROS  negative genitourinary   Musculoskeletal  (+) Arthritis , Osteoarthritis,    Abdominal   Peds  Hematology negative hematology ROS (+)   Anesthesia Other Findings   Reproductive/Obstetrics negative OB ROS                             Anesthesia Physical Anesthesia Plan  ASA: II  Anesthesia Plan: General   Post-op Pain Management: GA combined w/ Regional for post-op pain   Induction:   PONV Risk Score and Plan: 2 and Ondansetron, Dexamethasone, Treatment may vary due to age or medical condition and Midazolam  Airway Management Planned: Oral ETT and LMA  Additional Equipment:   Intra-op Plan:   Post-operative Plan: Extubation in OR  Informed Consent: I have reviewed the patients History and Physical, chart, labs and discussed the procedure including the risks, benefits and alternatives for the proposed anesthesia with the patient or authorized representative who has indicated his/her understanding and acceptance.     Dental Advisory Given  Plan Discussed  with: CRNA, Anesthesiologist and Surgeon  Anesthesia Plan Comments: (Discussed both nerve block for pain relief post-op and GA; including NV, sore throat, dental injury, and pulmonary complications)       Anesthesia Quick Evaluation

## 2019-08-07 NOTE — Discharge Instructions (Signed)
Discharge Instructions after Arthroscopic Shoulder Surgery   A sling has been provided for you. You may remove the sling after 72 hours. The sling may be worn for your protection, if you are in a crowd.  Use ice on the shoulder intermittently over the first 48 hours after surgery.  Pain medication has been prescribed for you.  Use your medication liberally over the first 48 hours, and then begin to taper your use. You may take Extra Strength Tylenol or Tylenol only in place of the pain pills. DO NOT take ANY nonsteroidal anti-inflammatory pain medications: Advil, Motrin, Ibuprofen, Aleve, Naproxen, or Naprosyn.  You may remove your dressing after two days.  You may shower 5 days after surgery. The incision CANNOT get wet prior to 5 days. Simply allow the water to wash over the site and then pat dry. Do not rub the incision. Make sure your axilla (armpit) is completely dry after showering.  Take one aspirin a day for 2 weeks after surgery, unless you have an aspirin sensitivity/allergy or asthma.  Three to 5 times each day you should perform assisted overhead reaching and external rotation (outward turning) exercises with the operative arm. Both exercises should be done with the non-operative arm used as the "therapist arm" while the operative arm remains relaxed. Ten of each exercise should be done three to five times each day.    Overhead reach is helping to lift your stiff arm up as high as it will go. To stretch your overhead reach, lie flat on your back, relax, and grasp the wrist of the tight shoulder with your opposite hand. Using the power in your opposite arm, bring the stiff arm up as far as it is comfortable. Start holding it for ten seconds and then work up to where you can hold it for a count of 30. Breathe slowly and deeply while the arm is moved. Repeat this stretch ten times, trying to help the arm up a little higher each time.       External rotation is turning the arm out to  the side while your elbow stays close to your body. External rotation is best stretched while you are lying on your back. Hold a cane, yardstick, broom handle, or dowel in both hands. Bend both elbows to a right angle. Use steady, gentle force from your normal arm to rotate the hand of the stiff shoulder out away from your body. Continue the rotation as far as it will go comfortably, holding it there for a count of 10. Repeat this exercise ten times.     Please call [phone] during normal business hours or [phone] after hours for any problems. Including the following:  - excessive redness of the incisions - drainage for more than 4 days - fever of more than 101.5 F  *Please note that pain medications will not be refilled after hours or on weekends.    Post Anesthesia Home Care Instructions  Activity: Get plenty of rest for the remainder of the day. A responsible individual must stay with you for 24 hours following the procedure.  For the next 24 hours, DO NOT: -Drive a car -Operate machinery -Drink alcoholic beverages -Take any medication unless instructed by your physician -Make any legal decisions or sign important papers.  Meals: Start with liquid foods such as gelatin or soup. Progress to regular foods as tolerated. Avoid greasy, spicy, heavy foods. If nausea and/or vomiting occur, drink only clear liquids until the nausea and/or vomiting subsides. Call   your physician if vomiting continues.  Special Instructions/Symptoms: Your throat may feel dry or sore from the anesthesia or the breathing tube placed in your throat during surgery. If this causes discomfort, gargle with warm salt water. The discomfort should disappear within 24 hours.  If you had a scopolamine patch placed behind your ear for the management of post- operative nausea and/or vomiting:  1. The medication in the patch is effective for 72 hours, after which it should be removed.  Wrap patch in a tissue and  discard in the trash. Wash hands thoroughly with soap and water. 2. You may remove the patch earlier than 72 hours if you experience unpleasant side effects which may include dry mouth, dizziness or visual disturbances. 3. Avoid touching the patch. Wash your hands with soap and water after contact with the patch.    Regional Anesthesia Blocks  1. Numbness or the inability to move the "blocked" extremity may last from 3-48 hours after placement. The length of time depends on the medication injected and your individual response to the medication. If the numbness is not going away after 48 hours, call your surgeon.  2. The extremity that is blocked will need to be protected until the numbness is gone and the  Strength has returned. Because you cannot feel it, you will need to take extra care to avoid injury. Because it may be weak, you may have difficulty moving it or using it. You may not know what position it is in without looking at it while the block is in effect.  3. For blocks in the legs and feet, returning to weight bearing and walking needs to be done carefully. You will need to wait until the numbness is entirely gone and the strength has returned. You should be able to move your leg and foot normally before you try and bear weight or walk. You will need someone to be with you when you first try to ensure you do not fall and possibly risk injury.  4. Bruising and tenderness at the needle site are common side effects and will resolve in a few days.  5. Persistent numbness or new problems with movement should be communicated to the surgeon or the [facility] ([phone])/ [facility] ([phone]). 

## 2019-08-07 NOTE — Op Note (Signed)
Procedure(s):   Cory Barnett male 73 y.o. 08/07/2019  Preoperative diagnosis: Right shoulder massive rotator cuff tear  Postoperative diagnosis: Same  Procedure(s) and Anesthesia Type: #1 right shoulder arthroscopic extensive debridement irreparable rotator cuff tear #2 right shoulder arthroscopic subacromial decompression  Surgeon(s) and Role:    Tania Ade, MD - Primary     Surgeon: Isabella Stalling   Assistants: Jeanmarie Hubert PA-C [facility] was present and scrubbed throughout the procedure and was essential in positioning, assisting with the camera and instrumentation,, and closure)  Anesthesia: General endotracheal anesthesia with preoperative interscalene block given by the attending anesthesiologist    Procedure Detail Estimated Blood Loss: Min         Drains: none  Blood Given: none         Specimens: none        Complications:  * No complications entered in OR log *         Disposition: PACU - hemodynamically stable.         Condition: stable    Procedure:   INDICATIONS FOR SURGERY: The patient is 73 y.o. male who fell about 3 months ago and had subsequent right shoulder pain and weakness.  After initial conservative treatment period he had an MRI which showed massive retracted rotator cuff tear.  I was concerned about the ability to fix this large tear with significant retraction in a patient of this age, however was reluctant to recommend an arthroplasty procedure prior to an attempted repair.  He did understand that given the subacute nature of the tear and the large tear size with retraction that it may not be repairable.  We both agreed that moving ahead with an attempted repair was the right answer for him.  OPERATIVE FINDINGS: Examination under anesthesia: He has significant stiffness with forward flexion limited to 90 degrees.  With gentle manipulation under anesthesia there was a satisfactory lysis of adhesions and I was able to recover  full motion.   DESCRIPTION OF PROCEDURE: The patient was identified in preoperative  holding area where I personally marked the operative site after  verifying site, side, and procedure with the patient. An interscalene block was given by the attending anesthesiologist the holding area.  The patient was taken back to the operating room where general anesthesia was induced without complication and was placed in the beach-chair position with the back  elevated about 60 degrees and all extremities and head and neck carefully padded and  positioned.   The right upper extremity was then prepped and  draped in a standard sterile fashion. The appropriate time-out  procedure was carried out. The patient did receive IV antibiotics  within 30 minutes of incision.   A small posterior portal incision was made and the arthroscope was introduced into the joint. An anterior portal was then established above the subscapularis using needle localization. Small cannula was placed anteriorly. Diagnostic arthroscopy was then carried out.  The subscapularis was noted to be completely intact.  The biceps tendon and superior labrum were intact.  He had significant synovitis throughout the joint.   The rotator cuff had a few adhesions that were lysed and the actual tendon was noted to be retracted to the level of the glenoid with supraspinatus and infraspinatus completely torn.  The arthroscope was then introduced into the subacromial space a standard lateral portal was established with needle localization. The shaver was used through the lateral portal to perform extensive bursectomy.   I released all adhesions  beneath the rotator cuff and above the glenoid and as well in the subacromial space.  The spine of the scapula was identified.  After complete mobilization with traction on the tendon I was only able to bring it less than half way back to where it needed to be. At this point I deem this to be an irreparable  tear and spent some time cleaning up the remaining tendon on the tuberosity to create a smooth surface.  There was some adhesions laterally which were lysed.  The undersurface of the acromion was noted to be irregular and rough.  The coracoacromial ligament was left intact but the bur was used to smooth the undersurface of the acromion creating a nice smooth layer to allow the tuberosity to articulate with hopefully less discomfort.  The arthroscopic equipment was removed from the joint and the portals were closed with 3-0 nylon in an interrupted fashion. Sterile dressings were then applied including Xeroform 4 x 4's ABDs and tape. The patient was then allowed to awaken from general anesthesia, placed in a sling, transferred to the stretcher and taken to the recovery room in stable condition.   POSTOPERATIVE PLAN: The patient will be discharged home today and will followup in one week for suture removal and wound check.  We can try to get him into some rehab and see how much function we can regain.  If he fails to get back to a level where he is satisfied with pain and function he may be a candidate for reverse total shoulder arthroplasty.

## 2019-08-08 ENCOUNTER — Encounter (HOSPITAL_BASED_OUTPATIENT_CLINIC_OR_DEPARTMENT_OTHER): Payer: Self-pay | Admitting: Orthopedic Surgery

## 2019-08-08 NOTE — Anesthesia Postprocedure Evaluation (Signed)
Anesthesia Post Note  Patient: JEZIEL LIENHARD  Procedure(s) Performed: SHOULDER ARTHROSCOPY WITH SUBACROMIAL DECOMPRESSION (Right Shoulder)     Patient location during evaluation: PACU Anesthesia Type: General Level of consciousness: awake and alert Pain management: pain level controlled Vital Signs Assessment: post-procedure vital signs reviewed and stable Respiratory status: spontaneous breathing, nonlabored ventilation, respiratory function stable and patient connected to nasal cannula oxygen Cardiovascular status: blood pressure returned to baseline and stable Postop Assessment: no apparent nausea or vomiting Anesthetic complications: no    Last Vitals:  Vitals:   08/07/19 1700 08/07/19 1715  BP: 138/82 140/75  Pulse: 67 68  Resp: 19 20  Temp:  36.7 C  SpO2: 95% 97%    Last Pain:  Vitals:   08/08/19 0919  TempSrc:   PainSc: 2                  Truong Delcastillo

## 2019-08-15 ENCOUNTER — Ambulatory Visit: Payer: Medicare Other | Attending: Orthopedic Surgery | Admitting: Physical Therapy

## 2019-08-15 ENCOUNTER — Other Ambulatory Visit: Payer: Self-pay

## 2019-08-15 ENCOUNTER — Encounter: Payer: Self-pay | Admitting: Physical Therapy

## 2019-08-15 DIAGNOSIS — M25511 Pain in right shoulder: Secondary | ICD-10-CM | POA: Diagnosis not present

## 2019-08-15 DIAGNOSIS — M25611 Stiffness of right shoulder, not elsewhere classified: Secondary | ICD-10-CM | POA: Insufficient documentation

## 2019-08-15 DIAGNOSIS — M25552 Pain in left hip: Secondary | ICD-10-CM | POA: Insufficient documentation

## 2019-08-15 DIAGNOSIS — M6281 Muscle weakness (generalized): Secondary | ICD-10-CM | POA: Insufficient documentation

## 2019-08-15 NOTE — Therapy (Signed)
[facility] [address], [place], [place], [address] Phone: [phone]   Fax:  [phone]  Physical Therapy Evaluation  Patient Details  Name: Cory Barnett MRN: [id number] Date of Birth: 1946-05-09 Referring Provider (PT): Dr. Tania Ade   Encounter Date: 08/15/2019  PT End of Session - 08/15/19 1357    Visit Number  1    Date for PT Re-Evaluation  09/26/19    Authorization Type  Medicare    PT Start Time  [id number]    PT Stop Time  1240    PT Time Calculation (min)  55 min    Activity Tolerance  Patient tolerated treatment well;No increased pain    Behavior During Therapy  WFL for tasks assessed/performed       Past Medical History:  Diagnosis Date  . Anxiety   . AV block, 1st degree   . Basal cell carcinoma   . Cardiac murmur   . Cataract   . Elevated PSA measurement 2018  . Hyperlipidemia   . Hypertension 2015  . 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 IFG (impaired fasting glucose) 01/2016  . Pre-diabetes   . RBBB (right bundle branch block)   . Rheumatic fever    age 40 or 79, in hospital for a month, out of school for a yr  . Seasonal allergies   . Squamous cell carcinoma     Past Surgical History:  Procedure Laterality Date  . NASAL SEPTUM SURGERY    . NOSE SURGERY    . SHOULDER ARTHROSCOPY WITH SUBACROMIAL DECOMPRESSION Right 08/07/2019   Procedure: SHOULDER ARTHROSCOPY WITH SUBACROMIAL DECOMPRESSION;  Surgeon: Tania Ade, MD;  Location: [facility];  Service: Orthopedics;  Laterality: Right;  . SKIN CANCER EXCISION     basal and squamous cell   . TONSILLECTOMY    . VASECTOMY    . WISDOM TOOTH EXTRACTION      There were no vitals filed for this visit.   Subjective Assessment - 08/15/19 1151    Subjective  Patient had surgery on 08/07/2019. 2 main RTC tendons were severed and unable to attach. Patient has the supraspinatus and infraspinatus.    Pertinent History  elevation  within visual periphery    Patient Stated Goals  improve strength, function and ROM    Currently in Pain?  Yes    Pain Score  7     Pain Location  Shoulder    Pain Orientation  Right    Pain Descriptors / Indicators  Sharp    Pain Type  Surgical pain    Pain Onset  1 to 4 weeks ago    Pain Frequency  Intermittent    Aggravating Factors   shoulder movement    Pain Relieving Factors  resting    Multiple Pain Sites  No         [facility] PT Assessment - 08/15/19 0001      Assessment   Medical Diagnosis  s/p right shoulder arthroscopy    Referring Provider (PT)  Dr. 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, lift, hammering, use hand tools, desk work      Charity fundraiser Status  Within Functional Limits for tasks assessed      Observation/Other Assessments   Focus on Therapeutic Outcomes (FOTO)   patient disrection with limited use of his right arm is 75% limitation       ROM / Strength   AROM / PROM / Strength  AROM;PROM;Strength      AROM   Right Shoulder Flexion  72 Degrees    Right Shoulder ABduction  70 Degrees    Right Shoulder External Rotation  55 Degrees   70 degrees at 0 degrees abduction     PROM   Right Shoulder Flexion  120 Degrees   supine   Right Shoulder ABduction  102 Degrees    Right Shoulder Internal Rotation  65 Degrees   90 degrees abduction supine   Right Shoulder External Rotation  60 Degrees   90 degees abduction in supine     Strength   Overall Strength Comments  right elbow strength is 5/5                Objective measurements completed on examination: See above findings.       [facility] Adult PT Treatment/Exercise - 08/15/19 0001      Shoulder Exercises: Isometric Strengthening   Flexion  5X5"   with ball in standing   Extension  5X5"   with ball and standing   External Rotation  5X5"   with ball and standing   ABduction  5X5"   with ball and standing     Modalities   Modalities  Vasopneumatic      Vasopneumatic   Number Minutes Vasopneumatic   15 minutes    Vasopnuematic Location   Shoulder   right   Vasopneumatic Pressure  Medium    Vasopneumatic Temperature   3 snow flakes             PT Education - 08/15/19 1238    Education Details  Access Code: [email or web address]    Person(s) Educated  Patient    Methods  Explanation;Demonstration;Verbal cues;Handout    Comprehension  Returned demonstration;Verbalized understanding       PT Short Term Goals - 08/15/19 1349      PT SHORT TERM GOAL #1   Title  independent with initial HEP    Time  3    Period  Weeks    Status  New    Target Date  09/05/19      PT SHORT TERM GOAL #2   Title  The patient will report a 30% improvement in shoulder pain with grooming/dressing tasks    Time  4    Period  Weeks    Status  New    Target Date  09/05/19      PT SHORT TERM GOAL #3   Title  Right shoulder active or active assisted elevation to 80  degrees needed for home and work ADLs in sitting    Time  3    Period  Weeks    Status  New    Target Date  09/05/19        PT Long Term Goals - 08/15/19 1351      PT LONG TERM GOAL #1   Title  The patient will be independent in safe self progression of HEP  for further improvements in ROM and strength  Time  6    Period  Weeks    Status  New    Target Date  09/26/19      PT LONG TERM GOAL #2   Title  The patient will have improved right shoulder elevation to 110 degrees needed for reaching eye level shelves and personal care    Time  6    Period  Weeks    Status  New    Target Date  09/26/19      PT LONG TERM GOAL #3   Title  The patient will  have improved pain with  usual ADLs by at least 80%    Time  6    Period  Weeks    Status  New    Target Date  09/26/19      PT LONG TERM GOAL #4   Title  right shoulder abduction >/= 100 degrees to reach out for an item    Time  6    Period  Weeks    Status  New    Target Date  09/26/19      PT LONG TERM GOAL #5   Title  The patient will have grossly 3+/5 to 4-/5 strength in deltoids and scapula stabilizers needed for light lifting needed at home and work    Time  6    Period  Weeks    Status  New    Target Date  09/26/19             Plan - 08/15/19 1238    Clinical Impression Statement  Patietn is s/p right shoulder arthroscopy on 08/07/2019. Surgeon was not able to repair the infraspinatus and supraspinatus  tendon but able to clean up the joint. Patient has limited A/ROM and P/ROM to right shoulder and decreased strength. Patient has tenderness located in the right shoulder. Patient right elbow strength is 5/5. Patient has weakness in the scapula muscles. 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 Patient has to sleep on his left side due to shoulder pain. Patient will benefit from skilled therapy to improve right shoulder strength and ROM to improve function at work and home.    Personal Factors and Comorbidities  Age;Comorbidity 1;Comorbidity 2    Comorbidities  osteoarthritis knees, shoulder;  HTN    Examination-Activity Limitations  Reach Overhead;Self Feeding;Dressing;Lift;Carry    Examination-Participation Restrictions  Community Activity;Other    Rehab Potential  Good    PT Frequency  3x / week    PT Duration  6 weeks    PT Treatment/Interventions  ADLs/Self Care Home Management;Electrical Stimulation;Cryotherapy;Moist Heat;Therapeutic activities;Therapeutic exercise;Neuromuscular re-education;Manual techniques;Patient/family  education;Taping;Vasopneumatic Device;Dry needling;Scar mobilization    PT Next Visit Plan  A/AROM; P/ROM to right shoulder within tolerance; shoulder isometrics, scapula strength, soft tissue work to right shoulder    PT Home Exercise Plan  Access Code: [email or web address]    Consulted and Agree with Plan of Care  Patient       Patient will benefit from skilled therapeutic intervention in order to improve the following deficits and impairments:  Decreased range of motion, Impaired UE functional use, Decreased activity tolerance, Impaired perceived functional ability, Pain, Decreased strength  Visit Diagnosis: Acute pain of right shoulder - Plan: PT plan of care cert/re-cert  Stiffness of right shoulder, not elsewhere classified - Plan: PT plan of care cert/re-cert  Muscle  weakness (generalized) - Plan: PT plan of care cert/re-cert     Problem List Patient Active Problem List   Diagnosis Date Noted  . Rotator cuff tear, right 05/29/2019  . Closed fracture of distal clavicle 05/15/2019  . 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 [place], [place], [address] Phone: [phone]   Fax:  [phone]  Name: Cory Barnett MRN: [id number] Date of Birth: Mar 01, 1946

## 2019-08-15 NOTE — Patient Instructions (Signed)
Access Code: [email or web address]  URL: [email or web address]  Date: 08/15/2019  Prepared by: Earlie Counts   Exercises Supine Shoulder Flexion AAROM with Hands Clasped - 5 reps - 1 sets - 1-2x daily - 7x weekly Isometric Shoulder External Rotation with Ball at [place] 10 reps - 1 sets - 5 sec hold - 2x daily - 7x weekly Isometric Shoulder Extension with Ball at [place] 10 reps - 1 sets - 5 sec hold - 2x daily - 7x weekly Isometric Shoulder Flexion with Ball at [place] 10 reps - 1 sets - 5 sec hold - 2x daily - 7x weekly Isometric Shoulder Abduction with Ball at [place] 10 reps - 1 sets - 5 sec hold - 2x daily - 7x weekly [facility] [address] [place], [place] [address] Phone # [phone] Fax [phone]

## 2019-08-17 ENCOUNTER — Other Ambulatory Visit: Payer: Self-pay

## 2019-08-17 ENCOUNTER — Ambulatory Visit: Payer: Medicare Other | Admitting: Physical Therapy

## 2019-08-17 DIAGNOSIS — M6281 Muscle weakness (generalized): Secondary | ICD-10-CM

## 2019-08-17 DIAGNOSIS — M25611 Stiffness of right shoulder, not elsewhere classified: Secondary | ICD-10-CM

## 2019-08-17 DIAGNOSIS — M25511 Pain in right shoulder: Secondary | ICD-10-CM | POA: Diagnosis not present

## 2019-08-17 DIAGNOSIS — M25552 Pain in left hip: Secondary | ICD-10-CM | POA: Diagnosis not present

## 2019-08-17 NOTE — Therapy (Signed)
[facility] [address], [place], [place], [address] Phone: [phone]   Fax:  [phone]  Physical Therapy Treatment  Patient Details  Name: Cory Barnett MRN: [id number] Date of Birth: January 25, 1946 Referring Provider (PT): Dr. Tania Ade   Encounter Date: 08/17/2019  PT End of Session - 08/17/19 0842    Visit Number  2    Date for PT Re-Evaluation  09/26/19    Authorization Type  Medicare KX at visit 4 secondary to previous PT this year    PT Start Time  0800    PT Stop Time  0850    PT Time Calculation (min)  50 min    Activity Tolerance  Patient tolerated treatment well       Past Medical History:  Diagnosis Date  . Anxiety   . AV block, 1st degree   . Basal cell carcinoma   . Cardiac murmur   . Cataract   . Elevated PSA measurement 2018  . Hyperlipidemia   . Hypertension 2015  . 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 IFG (impaired fasting glucose) 01/2016  . Pre-diabetes   . RBBB (right bundle branch block)   . Rheumatic fever    age 56 or 59, in hospital for a month, out of school for a yr  . Seasonal allergies   . Squamous cell carcinoma     Past Surgical History:  Procedure Laterality Date  . NASAL SEPTUM SURGERY    . NOSE SURGERY    . SHOULDER ARTHROSCOPY WITH SUBACROMIAL DECOMPRESSION Right 08/07/2019   Procedure: SHOULDER ARTHROSCOPY WITH SUBACROMIAL DECOMPRESSION;  Surgeon: Tania Ade, MD;  Location: [facility];  Service: Orthopedics;  Laterality: Right;  . SKIN CANCER EXCISION     basal and squamous cell   . TONSILLECTOMY    . VASECTOMY    . WISDOM TOOTH EXTRACTION      There were no vitals filed for this visit.  Subjective Assessment - 08/17/19 0804    Subjective  Saw the surgeon yesterday.  Had surgery 08/07/19.  Could not  repair RC tendons.  No arthritis.  Took off bone spur.  He said with faithful PT he thought I could do pretty well and not have a shoulder  replacement.  He said cardio, leg strengthening biceps/triceps and  at the gym is OK but to save the shoulder ex for PT.    Currently in Pain?  No/denies    Pain Score  0-No pain    Pain Location  Shoulder    Pain Orientation  Right    Pain Type  Surgical pain                       [facility] Adult PT Treatment/Exercise - 08/17/19 0001      Shoulder Exercises: Supine   Protraction  AAROM;Right;10 reps    Other Supine Exercises  from 90 degrees assisted position 12:00/6:00 small arcs 10x    Other Supine Exercises  9:00/3:00 from 90 degrees small arcs 10x       Shoulder Exercises: Seated   Other Seated Exercises  UE Ranger rows 10x      Shoulder Exercises: Standing   Other Standing Exercises  UE Ranger on floor rows 15x     Other Standing Exercises  UE Ranger on low table at the lowest height of UE Ranger 10x rows       Shoulder Exercises: ROM/Strengthening   Rhythmic Stabilization,  Supine  2x 30 sec mullti direction       Vasopneumatic   Number Minutes Vasopneumatic   15 minutes    Vasopnuematic Location   Shoulder   right   Vasopneumatic Pressure  Low    Vasopneumatic Temperature   3 snow flakes      Manual Therapy   Passive ROM  flexion 15x with elbow press down on the lowering                PT Short Term Goals - 08/15/19 1349      PT SHORT TERM GOAL #1   Title  independent with initial HEP    Time  3    Period  Weeks    Status  New    Target Date  09/05/19      PT SHORT TERM GOAL #2   Title  The patient will report a 30% improvement in shoulder pain with grooming/dressing tasks    Time  4    Period  Weeks    Status  New    Target Date  09/05/19      PT SHORT TERM GOAL #3   Title  Right shoulder active or active assisted elevation to 80  degrees needed for home and work ADLs in sitting    Time  3    Period  Weeks    Status  New    Target Date  09/05/19        PT Long Term Goals - 08/15/19 1351      PT LONG TERM GOAL #1   Title   The patient will be independent in safe self progression of HEP  for further improvements in ROM and strength    Time  6    Period  Weeks    Status  New    Target Date  09/26/19      PT LONG TERM GOAL #2   Title  The patient will have improved right shoulder elevation to 110 degrees needed for reaching eye level shelves and personal care    Time  6    Period  Weeks    Status  New    Target Date  09/26/19      PT LONG TERM GOAL #3   Title  The patient will have improved pain with  usual ADLs by at least 80%    Time  6    Period  Weeks    Status  New    Target Date  09/26/19      PT LONG TERM GOAL #4   Title  right shoulder abduction >/= 100 degrees to reach out for an item    Time  6    Period  Weeks    Status  New    Target Date  09/26/19      PT LONG TERM GOAL #5   Title  The patient will have grossly 3+/5 to 4-/5 strength in deltoids and scapula stabilizers needed for light lifting needed at home and work    Time  6    Period  Weeks    Status  New    Target Date  09/26/19            Plan - 08/17/19 0842    Clinical Impression Statement  The patient has full passive flexion in supine but painful with lowering.  Decreased discomfort with co-cotraction of shoulder extensors with lowering.  Once assisted to 90 degrees flexion in supine, he is able  to stabilize and hold for 30 sec increments,  and move in small directions unassisted.  He reports muscular fatigue and he needs rest breaks after 10 reps of each exercise.  Good pain relief with vasocompression although he prefers low compression to medium compression.  Therapist closely monitoirng respoonse with all interventions and modifying as needed.    Comorbidities  osteoarthritis knees, shoulder;  HTN    Examination-Activity Limitations  Reach Overhead;Self Feeding;Dressing;Lift;Carry    Rehab Potential  Good    PT Frequency  3x / week    PT Treatment/Interventions  ADLs/Self Care Home Management;Electrical  Stimulation;Cryotherapy;Moist Heat;Therapeutic activities;Therapeutic exercise;Neuromuscular re-education;Manual techniques;Patient/family education;Taping;Vasopneumatic Device;Dry needling;Scar mobilization    PT Next Visit Plan  A/AROM; P/ROM to right shoulder within tolerance; supine rhythmic stab and small arcs of movement;  shoulder isometrics, scapula strength, soft tissue work to right shoulder;  vasocompression    PT Home Exercise Plan  Access Code: [email or web address]       Patient will benefit from skilled therapeutic intervention in order to improve the following deficits and impairments:  Decreased range of motion, Impaired UE functional use, Decreased activity tolerance, Impaired perceived functional ability, Pain, Decreased strength  Visit Diagnosis: Acute pain of right shoulder  Stiffness of right shoulder, not elsewhere classified  Muscle weakness (generalized)     Problem List Patient Active Problem List   Diagnosis Date Noted  . Rotator cuff tear, right 05/29/2019  . Closed fracture of distal clavicle 05/15/2019  . 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   [name], PT 08/17/19 9:03 AM Phone: [phone] Fax: [phone] [name] 08/17/2019, 9:01 AM  [facility] [address], [place], [place], [address] Phone: [phone]   Fax:  [phone]  Name: Cory Barnett MRN: [id number] Date of Birth: 1946/01/27

## 2019-08-17 NOTE — Progress Notes (Signed)
[name] [place],  [address] Phone: [phone] Subjective:   Fontaine No, am serving as a scribe for Dr. Hulan [name].   CC: Left hip pain  [id number]  Cory Barnett is a 73 y.o. male coming in with complaint of left hip pain. Was seen previously for right shoulder pain. S/p right shoulder arthroscopy and subacromial decompression on 08/07/2019. Patient states hip has become bothersome as he has been lying on left side when sleeping due to shoulder surgery. Pain over the piriformis. Has been stretching the hip and tried massage. Has been using 400mg  IBU at bedtime.   Patient states it is more in the buttocks area.  Patient is having increasing discomfort and pain.  Patient states when he gets up takes 20 minutes until it loosens up the pain seems to get better with activity.  Mild radiation down the posterior aspect of the leg.    Past Medical History:  Diagnosis Date  . Anxiety   . AV block, 1st degree   . Basal cell carcinoma   . Cardiac murmur   . Cataract   . Elevated PSA measurement 2018  . Hyperlipidemia   . Hypertension 2015  . 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 IFG (impaired fasting glucose) 01/2016  . Pre-diabetes   . RBBB (right bundle branch block)   . Rheumatic fever    age 39 or 63, in hospital for a month, out of school for a yr  . Seasonal allergies   . Squamous cell carcinoma    Past Surgical History:  Procedure Laterality Date  . NASAL SEPTUM SURGERY    . NOSE SURGERY    . SHOULDER ARTHROSCOPY WITH SUBACROMIAL DECOMPRESSION Right 08/07/2019   Procedure: SHOULDER ARTHROSCOPY WITH SUBACROMIAL DECOMPRESSION;  Surgeon: Tania Ade, MD;  Location: [facility];  Service: Orthopedics;  Laterality: Right;  . SKIN CANCER EXCISION     basal and squamous cell   . TONSILLECTOMY    . VASECTOMY    . WISDOM TOOTH EXTRACTION     Social History   Socioeconomic History  . Marital status:  Married    Spouse name: Not on file  . Number of children: 3  . Years of education: Not on file  . Highest education level: Not on file  Occupational History    Comment: Retired  Scientific laboratory technician  . Financial resource strain: Not on file  . Food insecurity    Worry: Not on file    Inability: Not on file  . Transportation needs    Medical: Not on file    Non-medical: Not on file  Tobacco Use  . Smoking status: Former Research scientist (life sciences)  . Smokeless tobacco: Never Used  Substance and Sexual Activity  . Alcohol use: Yes    Alcohol/week: 2.0 standard drinks    Types: 2 Cans of beer per week    Comment: social  . Drug use: Never  . Sexual activity: Not on file  Lifestyle  . Physical activity    Days per week: Not on file    Minutes per session: Not on file  . Stress: Not on file  Relationships  . Social Herbalist on phone: Not on file    Gets together: Not on file    Attends religious service: Not on file    Active member of club or organization: Not on file    Attends meetings of clubs or organizations: Not  on file    Relationship status: Not on file  Other Topics Concern  . Not on file  Social History Narrative  . Not on file   Allergies  Allergen Reactions  . Bee Venom Anaphylaxis  . Moxifloxacin Shortness Of Breath   Family History  Problem Relation Age of Onset  . Stroke Mother   . Arthritis Mother   . Cancer Mother        unknown   . Dementia Mother   . Arthritis Father   . Mesothelioma Father   . Arthritis Sister   . Arthritis Sister   . Bipolar disorder Sister      Current Outpatient Medications (Cardiovascular):  .  losartan (COZAAR) 100 MG tablet, Take 1 tablet (100 mg total) by mouth daily. .  rosuvastatin (CRESTOR) 10 MG tablet, Take 10 mg by mouth daily. .  tadalafil (CIALIS) 10 MG tablet, Take 1 tablet (10 mg total) by mouth daily as needed for erectile dysfunction.  Current Outpatient Medications (Respiratory):  Marland Kitchen  Loratadine (CLARITIN) 10 MG  CAPS, Take 10 mg by mouth as needed.     Current Outpatient Medications (Other):  Marland Kitchen  Cholecalciferol (VITAMIN D-1000 MAX ST) 1000 units tablet, Take 2,000 mg by mouth daily.  .  Coenzyme Q10 (CO Q 10) 100 MG CAPS, Take 100 mg by mouth every morning. .  escitalopram (LEXAPRO) 5 MG tablet, Take 10 mg by mouth daily.  Marland Kitchen  LORazepam (ATIVAN) 1 MG tablet, Take 1 mg by mouth every 8 (eight) hours as needed for anxiety. .  Multiple Vitamin (MULTI-VITAMINS) TABS, Take 1 tablet by mouth daily. .  Omega-3 Fatty Acids (FISH OIL PO), Take 2 g by mouth daily. .  Probiotic Product (PROBIOTIC ADVANCED PO), Take 1 tablet by mouth daily with breakfast. .  Saw Palmetto 160 MG CAPS, Take 1,500 mg by mouth daily.    Past medical history, social, surgical and family history all reviewed in electronic medical record.  No pertanent information unless stated regarding to the chief complaint.   Review of Systems:  No headache, visual changes, nausea, vomiting, diarrhea, constipation, dizziness, abdominal pain, skin rash, fevers, chills, night sweats, weight loss, swollen lymph nodes, body aches, joint swelling,  chest pain, shortness of breath, mood changes.  Positive muscle aches  Objective  Blood pressure 120/82, pulse 72, height 6\' 2"  (1.88 m), weight 234 lb (106.1 kg), SpO2 95 %.    General: No apparent distress alert and oriented x3 mood and affect normal, dressed appropriately.  HEENT: Pupils equal, extraocular movements intact  Respiratory: Patient's speak in full sentences and does not appear short of breath  Cardiovascular: No lower extremity edema, non tender, no erythema  Skin: Warm dry intact with no signs of infection or rash on extremities or on axial skeleton.  Abdomen: Soft nontender  Neuro: Cranial nerves II through XII are intact, neurovascularly intact in all extremities with 2+ DTRs and 2+ pulses.  Lymph: No lymphadenopathy of posterior or anterior cervical chain or axillae bilaterally.   Gait normal with good balance and coordination.  MSK:  tender with full range of motion and good stability and symmetric strength and tone of , elbows, wrist, hip, knee and ankles bilaterally.  Right shoulder weakness Left hip exam shows the patient has near full range of motion but severe tenderness in the with [facility] test on the left side.  Negative straight leg test.  Patient is tender over the piriformis as well as laterally.  No pain over the  sacroiliac joint over the paraspinal musculature of the lumbar spine    Impression and Recommendations:     This case required medical decision making of moderate complexity. The above documentation has been reviewed and is accurate and complete Lyndal Pulley, DO       Note: This dictation was prepared with Dragon dictation along with smaller phrase technology. Any transcriptional errors that result from this process are unintentional.

## 2019-08-18 ENCOUNTER — Ambulatory Visit (INDEPENDENT_AMBULATORY_CARE_PROVIDER_SITE_OTHER): Payer: Medicare Other | Admitting: Family Medicine

## 2019-08-18 ENCOUNTER — Encounter: Payer: Self-pay | Admitting: Family Medicine

## 2019-08-18 ENCOUNTER — Ambulatory Visit: Payer: Medicare Other | Admitting: Physical Therapy

## 2019-08-18 ENCOUNTER — Ambulatory Visit: Payer: Self-pay

## 2019-08-18 ENCOUNTER — Other Ambulatory Visit: Payer: Self-pay

## 2019-08-18 VITALS — BP 120/82 | HR 72 | Ht 74.0 in | Wt 234.0 lb

## 2019-08-18 DIAGNOSIS — M25552 Pain in left hip: Secondary | ICD-10-CM

## 2019-08-18 DIAGNOSIS — M25611 Stiffness of right shoulder, not elsewhere classified: Secondary | ICD-10-CM

## 2019-08-18 DIAGNOSIS — G5702 Lesion of sciatic nerve, left lower limb: Secondary | ICD-10-CM | POA: Diagnosis not present

## 2019-08-18 DIAGNOSIS — M25511 Pain in right shoulder: Secondary | ICD-10-CM | POA: Diagnosis not present

## 2019-08-18 DIAGNOSIS — M6281 Muscle weakness (generalized): Secondary | ICD-10-CM | POA: Diagnosis not present

## 2019-08-18 NOTE — Assessment & Plan Note (Signed)
Left piriformis which activities of doing which 1 is doing returns to avoid.  Discussed regimen.Discussed tenderness: Minimal in size.  Patient given home exercises, worsening symptoms consider formal physical therapy also consider injection.

## 2019-08-18 NOTE — Patient Instructions (Signed)
Exercises 3x a week Tennis ball back right pocket PT [facility]  Read about PRP for shoulder See me again in 6 weeks

## 2019-08-18 NOTE — Therapy (Signed)
[facility] [address], [place], [place], [address] Phone: [phone]   Fax:  [phone]  Physical Therapy RE- Evaluation   Patient Details  Name: Cory Barnett MRN: [id number] Date of Birth: 28-Oct-1946 Referring Provider (PT): Dr. Tania Ade   Encounter Date: 08/18/2019  PT End of Session - 08/18/19 1210    Visit Number  3    Date for PT Re-Evaluation  09/26/19    Authorization Type  Medicare KX at visit 4 secondary to previous PT this year    PT Start Time  1107    PT Stop Time  1155    PT Time Calculation (min)  48 min    Activity Tolerance  Patient tolerated treatment well       Past Medical History:  Diagnosis Date  . Anxiety   . AV block, 1st degree   . Basal cell carcinoma   . Cardiac murmur   . Cataract   . Elevated PSA measurement 2018  . Hyperlipidemia   . Hypertension 2015  . 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 IFG (impaired fasting glucose) 01/2016  . Pre-diabetes   . RBBB (right bundle branch block)   . Rheumatic fever    age 73 or 64, in hospital for a month, out of school for a yr  . Seasonal allergies   . Squamous cell carcinoma     Past Surgical History:  Procedure Laterality Date  . NASAL SEPTUM SURGERY    . NOSE SURGERY    . SHOULDER ARTHROSCOPY WITH SUBACROMIAL DECOMPRESSION Right 08/07/2019   Procedure: SHOULDER ARTHROSCOPY WITH SUBACROMIAL DECOMPRESSION;  Surgeon: Tania Ade, MD;  Location: [facility];  Service: Orthopedics;  Laterality: Right;  . SKIN CANCER EXCISION     basal and squamous cell   . TONSILLECTOMY    . VASECTOMY    . WISDOM TOOTH EXTRACTION      There were no vitals filed for this visit.   Subjective Assessment - 08/18/19 1109    Subjective  I have a new order for PT for left piriformis sydrome.  When I get up from sitting I feel it in that hip. Especially hurts with left sidelying.  The pain radiates to the left knee.  The patient  just came from Dr. Thompson Caul office and has supine piriformis, supine ITB and bridging HEP.    Limitations  Walking;House hold activities;Other (comment)    How long can you walk comfortably?  2 blocks then 9/10 pain    Diagnostic tests  x-ray fx clavicle in 2 places, U/S rotator cuff torn 60-65% torn    Patient Stated Goals  improve strength, function and ROM    Currently in Pain?  Yes    Pain Score  9    with activity   Pain Location  Hip    Pain Orientation  Left    Pain Type  Acute pain    Aggravating Factors   sidelying, first few steps after sitting;  walking longer distances    Pain Score  1    Pain Location  Shoulder    Pain Orientation  Right         [facility] PT Assessment - 08/18/19 0001      AROM   Overall AROM Comments  decreased left hip mobility inferior and posterior capsule     Right Shoulder Flexion  72 Degrees    Right Shoulder ABduction  70 Degrees    Right Shoulder  External Rotation  55 Degrees   70 degrees at 0 degrees abduction   Left Hip Flexion  110    Left Hip External Rotation   20    Left Hip Internal Rotation   10      Strength   Overall Strength Comments  pelvic drop with SLS indlicating weakness in glute med     Left Hip Flexion  4/5    Left Hip Extension  4/5    Left Hip ABduction  4-/5      Flexibility   Soft Tissue Assessment /Muscle Length  yes    Piriformis  decreased on left       Palpation   Palpation comment  tender points in left piriformis and gluteals                 Objective measurements completed on examination: See above findings.      [facility] Adult PT Treatment/Exercise - 08/18/19 0001      Shoulder Exercises: Supine   Protraction  AROM;Right;10 reps    Protraction Limitations  supine with shoulder at 90 degrees ABCs    Other Supine Exercises  from 90 degrees assisted position 12:00/6:00 small arcs 10x    Other Supine Exercises  9:00/3:00 from 90 degrees small arcs 10x       Moist Heat Therapy   Number Minutes  Moist Heat  5 Minutes   concurrent with supine shoulder ex   Moist Heat Location  Hip      Manual Therapy   Joint Mobilization  left long axis distraction, inferior, AP in internal rotation grade 3 3x 30 sec each     Soft tissue mobilization  piriformis, gluteals     Muscle Energy Technique  contract relax piriformis 3x5 sec holds       Left  Trigger Point Dry Needling - 08/18/19 0001    Consent Given?  Yes    Education Handout Provided  Yes    Muscles Treated Back/Hip  Gluteus medius;Gluteus maximus;Piriformis    Gluteus Medius Response  Palpable increased muscle length    Gluteus Maximus Response  Palpable increased muscle length    Piriformis Response  Palpable increased muscle length           PT Education - 08/18/19 1151    Education Details  dry needling after care    Person(s) Educated  Patient    Methods  Explanation;Demonstration;Handout       PT Short Term Goals - 08/18/19 1220      PT SHORT TERM GOAL #1   Title  independent with initial HEP    Time  3    Period  Weeks    Status  Achieved      PT SHORT TERM GOAL #2   Title  The patient will report a 30% improvement in shoulder pain with grooming/dressing tasks    Time  4    Period  Weeks    Status  On-going      PT SHORT TERM GOAL #3   Title  Right shoulder active or active assisted elevation to 80  degrees needed for home and work ADLs in sitting    Status  Achieved      PT SHORT TERM GOAL #4   Title  ...    Time  4    Period  Weeks        PT Long Term Goals - 08/18/19 1221      PT LONG TERM GOAL #1  Title  The patient will be independent in safe self progression of HEP  for further improvements in ROM and strength    Time  6    Period  Weeks    Status  On-going    Target Date  09/26/19      PT LONG TERM GOAL #2   Title  The patient will have improved right shoulder elevation to 110 degrees needed for reaching eye level shelves and personal care    Time  6    Period  Weeks    Status   On-going      PT LONG TERM GOAL #3   Title  The patient will have improved shoulder pain with  usual ADLs by at least 80%    Time  6    Period  Weeks    Status  On-going      PT LONG TERM GOAL #4   Title  right shoulder abduction >/= 100 degrees to reach out for an item    Time  8    Period  Weeks    Status  On-going      PT LONG TERM GOAL #5   Title  The patient will have grossly 3+/5 to 4-/5 strength in deltoids and scapula stabilizers needed for light lifting needed at home and work    Time  8    Period  Weeks    Status  On-going      Additional Long Term Goals   Additional Long Term Goals  Yes      PT LONG TERM GOAL #6   Title  The patient will report a 60% reduction in left hip pain with rising sit to stand, walking > 2 blocks    Time  6    Period  Weeks    Status  New    Target Date  09/26/19      PT LONG TERM GOAL #7   Title  The patient will have grossly 4/5 to 4+/5 left hip strength and single leg stand > 6 sec    Time  6    Period  Weeks    Status  New    Target Date  09/26/19             Plan - 08/18/19 1210    Clinical Impression Statement  The patient has been receiving PT treatment for 1 week s/p right shoulder debridement and irreparable rotator cuff.  He presents with a new PT order for left hip pain from Dr. Tamala Julian who has also treated his right shoulder pain as well.  He complains of left lateral hip pain which at times radiates down to his knee.  Symptoms are aggravated with left sidelying, first few steps after sitting and walking > 2 blocks.  The pain will be severe at a 9/10 level.   Decreased strength in left gluteals 4-/5.  Single leg standing < 3 sec on left.  Other hip musculature grossly 4/5.  Tender points in left pirifiormis and glutes.  Decreased hip rotator, HS muscle lengths.  He would benefit from PT for hip pain along with continued treatment for right shoulder.    Personal Factors and Comorbidities  Age;Comorbidity 1;Comorbidity 2     Comorbidities  osteoarthritis knees, shoulder;  HTN    Examination-Activity Limitations  Reach Overhead;Self Feeding;Dressing;Lift;Carry;Locomotion Level    Rehab Potential  Good    PT Frequency  3x / week    PT Duration  6 weeks  PT Treatment/Interventions  ADLs/Self Care Home Management;Electrical Stimulation;Cryotherapy;Moist Heat;Therapeutic activities;Therapeutic exercise;Neuromuscular re-education;Manual techniques;Patient/family education;Taping;Vasopneumatic Device;Dry needling;Scar mobilization;Iontophoresis 4mg /ml Dexamethasone    PT Next Visit Plan  assess response to DN #1 to left hip and manual techniques;  ROMto right shoulder within tolerance; supine rhythmic stab and small arcs of movement;  shoulder isometrics, scapula strength, soft tissue work to right shoulder;  vasocompression    PT Home Exercise Plan  Access Code: [email or web address]       Patient will benefit from skilled therapeutic intervention in order to improve the following deficits and impairments:  Decreased range of motion, Impaired UE functional use, Decreased activity tolerance, Impaired perceived functional ability, Pain, Decreased strength  Visit Diagnosis: Acute pain of right shoulder - Plan: PT plan of care cert/re-cert  Stiffness of right shoulder, not elsewhere classified - Plan: PT plan of care cert/re-cert  Muscle weakness (generalized) - Plan: PT plan of care cert/re-cert  Pain in left hip - Plan: PT plan of care cert/re-cert     Problem List Patient Active Problem List   Diagnosis Date Noted  . Piriformis syndrome of left side 08/18/2019  . Rotator cuff tear, right 05/29/2019  . Closed fracture of distal clavicle 05/15/2019  . 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   [name], PT 08/18/19 12:28 PM Phone: [phone] Fax: [phone] [name] 08/18/2019, 12:28 PM  [facility] [address] [place], [place],  [address] Phone: [phone]   Fax:  [phone]  Name: COURY SCHOEPP MRN: [id number] Date of Birth: [birth year]

## 2019-08-18 NOTE — Patient Instructions (Signed)
     Trigger Point Dry Needling  . What is Trigger Point Dry Needling (DN)? o DN is a physical therapy technique used to treat muscle pain and dysfunction. Specifically, DN helps deactivate muscle trigger points (muscle knots).  o A thin filiform needle is used to penetrate the skin and stimulate the underlying trigger point. The goal is for a local twitch response (LTR) to occur and for the trigger point to relax. No medication of any kind is injected during the procedure.   . What Does Trigger Point Dry Needling Feel Like?  o The procedure feels different for each individual patient. Some patients report that they do not actually feel the needle enter the skin and overall the process is not painful. Very mild bleeding may occur. However, many patients feel a deep cramping in the muscle in which the needle was inserted. This is the local twitch response.   . How Will I feel after the treatment? o Soreness is normal, and the onset of soreness may not occur for a few hours. Typically this soreness does not last longer than two days.  o Bruising is uncommon, however; ice can be used to decrease any possible bruising.  o In rare cases feeling tired or nauseous after the treatment is normal. In addition, your symptoms may get worse before they get better, this period will typically not last longer than 24 hours.   . What Can I do After My Treatment? o Increase your hydration by drinking more water for the next 24 hours. o You may place ice or heat on the areas treated that have become sore, however, do not use heat on inflamed or bruised areas. Heat often brings more relief post needling. o You can continue your regular activities, but vigorous activity is not recommended initially after the treatment for 24 hours. o DN is best combined with other physical therapy such as strengthening, stretching, and other therapies.    Stacy Simpson PT [facility] [address], [address] [place], [place] [address] Phone # [phone] Fax [phone] 

## 2019-08-22 ENCOUNTER — Ambulatory Visit: Payer: Medicare Other | Admitting: Physical Therapy

## 2019-08-22 ENCOUNTER — Other Ambulatory Visit: Payer: Self-pay

## 2019-08-22 DIAGNOSIS — M25552 Pain in left hip: Secondary | ICD-10-CM

## 2019-08-22 DIAGNOSIS — M6281 Muscle weakness (generalized): Secondary | ICD-10-CM

## 2019-08-22 DIAGNOSIS — M25511 Pain in right shoulder: Secondary | ICD-10-CM

## 2019-08-22 DIAGNOSIS — M25611 Stiffness of right shoulder, not elsewhere classified: Secondary | ICD-10-CM | POA: Diagnosis not present

## 2019-08-22 NOTE — Therapy (Signed)
[facility] [address], [place], [place], [address] Phone: [phone]   Fax:  [phone]  Physical Therapy Treatment  Patient Details  Name: Cory Barnett MRN: [id number] Date of Birth: 1946-09-19 Referring Provider (PT): Dr. Tania Ade   Encounter Date: 08/22/2019  PT End of Session - 08/22/19 1917    Visit Number  4    Date for PT Re-Evaluation  09/26/19    Authorization Type  Medicare KX at visit 6 secondary to previous PT this year    PT Start Time  0805    PT Stop Time  0855    PT Time Calculation (min)  50 min    Activity Tolerance  Patient tolerated treatment well       Past Medical History:  Diagnosis Date  . Anxiety   . AV block, 1st degree   . Basal cell carcinoma   . Cardiac murmur   . Cataract   . Elevated PSA measurement 2018  . Hyperlipidemia   . Hypertension 2015  . 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 IFG (impaired fasting glucose) 01/2016  . Pre-diabetes   . RBBB (right bundle branch block)   . Rheumatic fever    age 73 or 73, in hospital for a month, out of school for a yr  . Seasonal allergies   . Squamous cell carcinoma     Past Surgical History:  Procedure Laterality Date  . NASAL SEPTUM SURGERY    . NOSE SURGERY    . SHOULDER ARTHROSCOPY WITH SUBACROMIAL DECOMPRESSION Right 08/07/2019   Procedure: SHOULDER ARTHROSCOPY WITH SUBACROMIAL DECOMPRESSION;  Surgeon: Tania Ade, MD;  Location: [facility];  Service: Orthopedics;  Laterality: Right;  . SKIN CANCER EXCISION     basal and squamous cell   . TONSILLECTOMY    . VASECTOMY    . WISDOM TOOTH EXTRACTION      There were no vitals filed for this visit.  Subjective Assessment - 08/22/19 0820    Subjective  My hip is doing better.  My shoulder hurts at night.  Moved some things out of a storage locker on Sunday.    Currently in Pain?  Yes    Pain Score  5     Pain Location  Shoulder    Pain Orientation   Right    Pain Score  2    Pain Location  Hip    Pain Orientation  Left                       [facility] Adult PT Treatment/Exercise - 08/22/19 0001      Knee/Hip Exercises: Stretches   Active Hamstring Stretch  Left;3 reps;30 seconds    Piriformis Stretch  Left;3 reps;30 seconds    Piriformis Stretch Limitations  seated     Other Knee/Hip Stretches  doorway hip flexor stretch with left UE movements 3x 5       Shoulder Exercises: Supine   Protraction  AROM;Right;10 reps    Protraction Limitations  supine with shoulder at 90 degrees ABCs holding 1#  weight     Other Supine Exercises  from 90 degrees assisted position 12:00/6:00 small arcs 10x 1#     Other Supine Exercises  9:00/3:00 from 90 degrees small arcs 10x  1#      Shoulder Exercises: Standing   Extension  Strengthening;Right;20 reps;Theraband    Theraband Level (Shoulder Extension)  Level 2 (Red)  Row  Strengthening;Right;15 reps    Theraband Level (Shoulder Row)  Level 2 (Red)    Other Standing Exercises  red band triceps 20x       Shoulder Exercises: ROM/Strengthening   Ranger  on 1st and 2nd step 10x each       Vasopneumatic   Number Minutes Vasopneumatic   15 minutes    Vasopnuematic Location   Shoulder   right   Vasopneumatic Pressure  Low    Vasopneumatic Temperature   3 snow flakes             PT Education - 08/22/19 1917    Education Details  Access Code: [email or web address]   seated HS and seated piriformis stretch    Person(s) Educated  Patient    Methods  Explanation;Demonstration;Handout    Comprehension  Returned demonstration;Verbalized understanding       PT Short Term Goals - 08/18/19 1220      PT SHORT TERM GOAL #1   Title  independent with initial HEP    Time  3    Period  Weeks    Status  Achieved      PT SHORT TERM GOAL #2   Title  The patient will report a 30% improvement in shoulder pain with grooming/dressing tasks    Time  4    Period  Weeks    Status  On-going      PT  SHORT TERM GOAL #3   Title  Right shoulder active or active assisted elevation to 80  degrees needed for home and work ADLs in sitting    Status  Achieved      PT SHORT TERM GOAL #4   Title  ...    Time  4    Period  Weeks        PT Long Term Goals - 08/18/19 1221      PT LONG TERM GOAL #1   Title  The patient will be independent in safe self progression of HEP  for further improvements in ROM and strength    Time  6    Period  Weeks    Status  On-going    Target Date  09/26/19      PT LONG TERM GOAL #2   Title  The patient will have improved right shoulder elevation to 110 degrees needed for reaching eye level shelves and personal care    Time  6    Period  Weeks    Status  On-going      PT LONG TERM GOAL #3   Title  The patient will have improved shoulder pain with  usual ADLs by at least 80%    Time  6    Period  Weeks    Status  On-going      PT LONG TERM GOAL #4   Title  right shoulder abduction >/= 100 degrees to reach out for an item    Time  8    Period  Weeks    Status  On-going      PT LONG TERM GOAL #5   Title  The patient will have grossly 3+/5 to 4-/5 strength in deltoids and scapula stabilizers needed for light lifting needed at home and work    Time  8    Period  Weeks    Status  On-going      Additional Long Term Goals   Additional Long Term Goals  Yes      PT LONG TERM  GOAL #6   Title  The patient will report a 60% reduction in left hip pain with rising sit to stand, walking > 2 blocks    Time  6    Period  Weeks    Status  New    Target Date  09/26/19      PT LONG TERM GOAL #7   Title  The patient will have grossly 4/5 to 4+/5 left hip strength and single leg stand > 6 sec    Time  6    Period  Weeks    Status  New    Target Date  09/26/19            Plan - 08/22/19 [id number]    Clinical Impression Statement  Right shoulder more painful today but decreased hip pain compared to last visit.  He is able to progress exercises with a light  weight in supine and initiated standing persiscapular ex's and elbow ex with resistance.  Good relief with hip stretching in seated position which will help with compliance while at work.  Good pain relief with vasocompression to shoulder post ex.  Therapist closely monitoring response and modifying as needed.    Rehab Potential  Good    PT Frequency  3x / week    PT Duration  6 weeks    PT Treatment/Interventions  ADLs/Self Care Home Management;Electrical Stimulation;Cryotherapy;Moist Heat;Therapeutic activities;Therapeutic exercise;Neuromuscular re-education;Manual techniques;Patient/family education;Taping;Vasopneumatic Device;Dry needling;Scar mobilization;Iontophoresis 4mg /ml Dexamethasone    PT Next Visit Plan  DN #2 to left hip and manual techniques;  doorway hip flexor/ITB on left;  ROM to right shoulder within tolerance; supine rhythmic stab and small arcs of movement;  shoulder isometrics, scapula strength, soft tissue work to right shoulder;  vasocompression    PT Home Exercise Plan  Access Code: [email or web address]       Patient will benefit from skilled therapeutic intervention in order to improve the following deficits and impairments:  Decreased range of motion, Impaired UE functional use, Decreased activity tolerance, Impaired perceived functional ability, Pain, Decreased strength  Visit Diagnosis: Acute pain of right shoulder  Stiffness of right shoulder, not elsewhere classified  Muscle weakness (generalized)  Pain in left hip     Problem List Patient Active Problem List   Diagnosis Date Noted  . Piriformis syndrome of left side 08/18/2019  . Rotator cuff tear, right 05/29/2019  . Closed fracture of distal clavicle 05/15/2019  . 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   [name], PT 08/22/19 7:24 PM Phone: [phone] Fax: [phone]  [name] 08/22/2019, 7:24 PM  [facility] [address]  [place], [place], [address] Phone: [phone]   Fax:  [phone]  Name: LATIMER JODON MRN: [id number] Date of Birth: [birth year]

## 2019-08-22 NOTE — Patient Instructions (Signed)
Access Code: [email or web address]  URL: [email or web address]  Date: 08/22/2019  Prepared by: [name]   Exercises  Supine Shoulder Flexion AAROM with Hands Clasped - 5 reps - 1 sets - 1-2x daily - 7x weekly  Isometric Shoulder External Rotation with Ball at Wall - 10 reps - 1 sets - 5 sec hold - 2x daily - 7x weekly  Isometric Shoulder Extension with Ball at [employer] - 10 reps - 1 sets - 5 sec hold - 2x daily - 7x weekly  Isometric Shoulder Flexion with Ball at [employer] - 10 reps - 1 sets - 5 sec hold - 2x daily - 7x weekly  Isometric Shoulder Abduction with Ball at [employer] - 10 reps - 1 sets - 5 sec hold - 2x daily - 7x weekly  Seated Piriformis Stretch with Trunk Bend - 3 reps - 1 sets - 30 hold - 1x daily - 7x weekly  Seated Hamstring Stretch - 3 reps - 1 sets - 30 hold - 1x daily - 7x weekly

## 2019-08-24 ENCOUNTER — Encounter: Payer: Self-pay | Admitting: Physical Therapy

## 2019-08-24 ENCOUNTER — Other Ambulatory Visit: Payer: Self-pay

## 2019-08-24 ENCOUNTER — Ambulatory Visit: Payer: Medicare Other | Admitting: Physical Therapy

## 2019-08-24 DIAGNOSIS — M6281 Muscle weakness (generalized): Secondary | ICD-10-CM | POA: Diagnosis not present

## 2019-08-24 DIAGNOSIS — M25511 Pain in right shoulder: Secondary | ICD-10-CM | POA: Diagnosis not present

## 2019-08-24 DIAGNOSIS — M25611 Stiffness of right shoulder, not elsewhere classified: Secondary | ICD-10-CM

## 2019-08-24 DIAGNOSIS — M25552 Pain in left hip: Secondary | ICD-10-CM

## 2019-08-24 NOTE — Therapy (Signed)
[facility] [address], [place], [place], [address] Phone: [phone]   Fax:  [phone]  Physical Therapy Treatment  Patient Details  Name: Cory Barnett MRN: [id number] Date of Birth: 10-22-1946 Referring Provider (PT): Dr. Tania Ade   Encounter Date: 08/24/2019  PT End of Session - 08/24/19 0905    Visit Number  5    Date for PT Re-Evaluation  09/26/19    Authorization Type  Medicare KX at visit 6 secondary to previous PT this year;  10th visit progress note    PT Start Time  0804    PT Stop Time  0858    PT Time Calculation (min)  54 min    Activity Tolerance  Patient tolerated treatment well       Past Medical History:  Diagnosis Date  . Anxiety   . AV block, 1st degree   . Basal cell carcinoma   . Cardiac murmur   . Cataract   . Elevated PSA measurement 2018  . Hyperlipidemia   . Hypertension 2015  . 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 IFG (impaired fasting glucose) 01/2016  . Pre-diabetes   . RBBB (right bundle branch block)   . Rheumatic fever    age 40 or 1, in hospital for a month, out of school for a yr  . Seasonal allergies   . Squamous cell carcinoma     Past Surgical History:  Procedure Laterality Date  . NASAL SEPTUM SURGERY    . NOSE SURGERY    . SHOULDER ARTHROSCOPY WITH SUBACROMIAL DECOMPRESSION Right 08/07/2019   Procedure: SHOULDER ARTHROSCOPY WITH SUBACROMIAL DECOMPRESSION;  Surgeon: Tania Ade, MD;  Location: [facility];  Service: Orthopedics;  Laterality: Right;  . SKIN CANCER EXCISION     basal and squamous cell   . TONSILLECTOMY    . VASECTOMY    . WISDOM TOOTH EXTRACTION      There were no vitals filed for this visit.  Subjective Assessment - 08/24/19 0806    Subjective  My shoulder got a work out yesterday pressure washing.  My hip feels much better but I think some more Dn would help.  I can tell the muscle on the side of the upper arm is  getting bigger.    Currently in Pain?  Yes    Pain Score  0-No pain   7/10 with reaching   Pain Location  Shoulder    Pain Orientation  Right    Pain Type  Acute pain    Pain Score  0    Pain Location  Hip    Pain Orientation  Left                       [facility] Adult PT Treatment/Exercise - 08/24/19 0001      Knee/Hip Exercises: Stretches   Other Knee/Hip Stretches  2nd step  hip flexor stretch with left UE movements 3x 5       Shoulder Exercises: Supine   Protraction  Strengthening;Right;15 reps;Weights    Protraction Weight (lbs)  1    Diagonals Limitations  diagonals 1# 10x 10/4 and 2/8 1#     Other Supine Exercises  from 90 degrees assisted position 12:00/6:00 small arcs 10x2 1#     Other Supine Exercises  9:00/3:00 from 90 degrees small arcs 10x2  1#      Shoulder Exercises: ROM/Strengthening   Ranger  on 1st and 2nd  step 10x each       Moist Heat Therapy   Number Minutes Moist Heat  5 Minutes   concurrent with supine shoulder ex   Moist Heat Location  Hip      Vasopneumatic   Number Minutes Vasopneumatic   15 minutes    Vasopnuematic Location   Shoulder   right   Vasopneumatic Pressure  Low    Vasopneumatic Temperature   3 snow flakes      Manual Therapy   Joint Mobilization  left long axis distraction, inferior, AP in internal rotation grade 3 3x 30 sec each     Soft tissue mobilization  piriformis, gluteals     Muscle Energy Technique  contract relax piriformis 3x5 sec holds                PT Short Term Goals - 08/18/19 1220      PT SHORT TERM GOAL #1   Title  independent with initial HEP    Time  3    Period  Weeks    Status  Achieved      PT SHORT TERM GOAL #2   Title  The patient will report a 30% improvement in shoulder pain with grooming/dressing tasks    Time  4    Period  Weeks    Status  On-going      PT SHORT TERM GOAL #3   Title  Right shoulder active or active assisted elevation to 80  degrees needed for home and  work ADLs in sitting    Status  Achieved      PT SHORT TERM GOAL #4   Title  ...    Time  4    Period  Weeks        PT Long Term Goals - 08/18/19 1221      PT LONG TERM GOAL #1   Title  The patient will be independent in safe self progression of HEP  for further improvements in ROM and strength    Time  6    Period  Weeks    Status  On-going    Target Date  09/26/19      PT LONG TERM GOAL #2   Title  The patient will have improved right shoulder elevation to 110 degrees needed for reaching eye level shelves and personal care    Time  6    Period  Weeks    Status  On-going      PT LONG TERM GOAL #3   Title  The patient will have improved shoulder pain with  usual ADLs by at least 80%    Time  6    Period  Weeks    Status  On-going      PT LONG TERM GOAL #4   Title  right shoulder abduction >/= 100 degrees to reach out for an item    Time  8    Period  Weeks    Status  On-going      PT LONG TERM GOAL #5   Title  The patient will have grossly 3+/5 to 4-/5 strength in deltoids and scapula stabilizers needed for light lifting needed at home and work    Time  8    Period  Weeks    Status  On-going      Additional Long Term Goals   Additional Long Term Goals  Yes      PT LONG TERM GOAL #6   Title  The  patient will report a 60% reduction in left hip pain with rising sit to stand, walking > 2 blocks    Time  6    Period  Weeks    Status  New    Target Date  09/26/19      PT LONG TERM GOAL #7   Title  The patient will have grossly 4/5 to 4+/5 left hip strength and single leg stand > 6 sec    Time  6    Period  Weeks    Status  New    Target Date  09/26/19            Plan - 08/24/19 [id number]    Clinical Impression Statement  The patient reports decreased overall hip pain today and improved soft tissue and joint mobility  noted.  Tender points mostly in gluteals rather than piriformis.  In supine (gravity assisted) the patient is able to elevate his right UE  although painful until past 90 degrees.  He fatigues with protraction at 15 reps.  Painful shoulder lowering but better with bent elbow and counterforce pressure into therapist's hand.  Therapist continually modifying exercises to lower pain intenity.   Vasocompression continues to provide pain relief after exercise.    Comorbidities  osteoarthritis knees, shoulder;  HTN    Examination-Activity Limitations  Reach Overhead;Self Feeding;Dressing;Lift;Carry;Locomotion Level    Rehab Potential  Good    PT Frequency  3x / week    PT Duration  6 weeks    PT Treatment/Interventions  ADLs/Self Care Home Management;Electrical Stimulation;Cryotherapy;Moist Heat;Therapeutic activities;Therapeutic exercise;Neuromuscular re-education;Manual techniques;Patient/family education;Taping;Vasopneumatic Device;Dry needling;Scar mobilization;Iontophoresis 4mg /ml Dexamethasone    PT Next Visit Plan  start [place];  check ROM;  doorway hip flexor/ITB on left;  ROM to right shoulder within tolerance; supine or slight elevation 1# rhythmic stab and small arcs of movement; dynamic shoulder isometrics, scapula strength, soft tissue work to right shoulder;  vasocompression       Patient will benefit from skilled therapeutic intervention in order to improve the following deficits and impairments:  Decreased range of motion, Impaired UE functional use, Decreased activity tolerance, Impaired perceived functional ability, Pain, Decreased strength  Visit Diagnosis: Acute pain of right shoulder  Stiffness of right shoulder, not elsewhere classified  Muscle weakness (generalized)  Pain in left hip     Problem List Patient Active Problem List   Diagnosis Date Noted  . Piriformis syndrome of left side 08/18/2019  . Rotator cuff tear, right 05/29/2019  . Closed fracture of distal clavicle 05/15/2019  . 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   [name], PT 08/24/19 9:19 AM Phone: [phone] Fax:  [phone] [name] 08/24/2019, 9:19 AM  [facility] [address] [place], [place], [address] Phone: [phone]   Fax:  [phone]  Name: JEISON GLAZEWSKI MRN: [id number] Date of Birth: 08-25-1946

## 2019-08-25 ENCOUNTER — Ambulatory Visit: Payer: Medicare Other | Admitting: Physical Therapy

## 2019-08-25 ENCOUNTER — Other Ambulatory Visit: Payer: Self-pay

## 2019-08-25 DIAGNOSIS — M25552 Pain in left hip: Secondary | ICD-10-CM

## 2019-08-25 DIAGNOSIS — M25511 Pain in right shoulder: Secondary | ICD-10-CM

## 2019-08-25 DIAGNOSIS — M25611 Stiffness of right shoulder, not elsewhere classified: Secondary | ICD-10-CM

## 2019-08-25 DIAGNOSIS — M6281 Muscle weakness (generalized): Secondary | ICD-10-CM

## 2019-08-25 NOTE — Therapy (Signed)
[facility] [address], [place], [place], [address] Phone: [phone]   Fax:  [phone]  Physical Therapy Treatment  Patient Details  Name: Cory Barnett MRN: [id number] Date of Birth: [birth year] Referring Provider (PT): Dr. Tania Ade   Encounter Date: 08/25/2019  PT End of Session - 08/25/19 0857    Visit Number  6    Date for PT Re-Evaluation  09/26/19    Authorization Type  Medicare KX now secondary to previous PT this year;  10th visit progress note    PT Start Time  0800    PT Stop Time  0850    PT Time Calculation (min)  50 min    Activity Tolerance  Patient limited by pain       Past Medical History:  Diagnosis Date  . Anxiety   . AV block, 1st degree   . Basal cell carcinoma   . Cardiac murmur   . Cataract   . Elevated PSA measurement 2018  . Hyperlipidemia   . Hypertension 2015  . 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 IFG (impaired fasting glucose) 01/2016  . Pre-diabetes   . RBBB (right bundle branch block)   . Rheumatic fever    age 21 or 59, in hospital for a month, out of school for a yr  . Seasonal allergies   . Squamous cell carcinoma     Past Surgical History:  Procedure Laterality Date  . NASAL SEPTUM SURGERY    . NOSE SURGERY    . SHOULDER ARTHROSCOPY WITH SUBACROMIAL DECOMPRESSION Right 08/07/2019   Procedure: SHOULDER ARTHROSCOPY WITH SUBACROMIAL DECOMPRESSION;  Surgeon: Tania Ade, MD;  Location: [facility];  Service: Orthopedics;  Laterality: Right;  . SKIN CANCER EXCISION     basal and squamous cell   . TONSILLECTOMY    . VASECTOMY    . WISDOM TOOTH EXTRACTION      There were no vitals filed for this visit.  Subjective Assessment - 08/25/19 0800    Subjective  Feels a little stronger (in my shoulder this morning).  I overdid it with the power washer Wednes.  My hip is OK.  Woke up with hip pain, did stretches and that helped.    Pertinent History   elevation within visual periphery    Limitations  Walking;House hold activities;Other (comment)    Currently in Pain?  Yes    Pain Score  2     Pain Location  Shoulder    Pain Orientation  Right    Pain Score  0    Pain Location  Hip    Pain Orientation  Left         [facility] PT Assessment - 08/25/19 0001      AROM   Overall AROM Comments  in supine able to elevate right shoulder to 140 degrees with bent arm initially;  needs left UE to lower for less pain     Left Hip Flexion  115    Left Hip External Rotation   25    Left Hip Internal Rotation   15                   [facility] Adult PT Treatment/Exercise - 08/25/19 0001      Knee/Hip Exercises: Stretches   Other Knee/Hip Stretches  foot on mat table with internal and external rotation     Other Knee/Hip Stretches  foot on mat table with hip flexor stretch  5x       Knee/Hip Exercises: Standing   Hip Abduction  Stengthening;Right;Left;10 reps    Abduction Limitations  green band     Hip Extension  Stengthening;Right;Left;10 reps;Knee straight    Extension Limitations  green band       Shoulder Exercises: Supine   Protraction  Strengthening;Right;15 reps    Protraction Weight (lbs)  0    Protraction Limitations  more painful today     Other Supine Exercises  attempted protraction/elevation propped up on wedge but too painful     Other Supine Exercises  flat table 1# 90 degrees to 120 degrees      Shoulder Exercises: Standing   Other Standing Exercises  holding green band isometrically by the side with walking backward and forward 10x each       Shoulder Exercises: ROM/Strengthening   Ranger  on mat table 20x     Rhythmic Stabilization, Supine  45 seconds at 90 degrees elevation     Other ROM/Strengthening Exercises  non surgical 1# 3 way elevation for crossover effect to right       Vasopneumatic   Number Minutes Vasopneumatic   15 minutes    Vasopnuematic Location   Shoulder   right   Vasopneumatic Pressure   Low    Vasopneumatic Temperature   3 snow flakes             PT Education - 08/25/19 0841    Education Details  Access Code: [email or web address]  hip green band extension and abduction    Person(s) Educated  Patient    Methods  Explanation;Demonstration;Handout    Comprehension  Returned demonstration;Verbalized understanding       PT Short Term Goals - 08/18/19 1220      PT SHORT TERM GOAL #1   Title  independent with initial HEP    Time  3    Period  Weeks    Status  Achieved      PT SHORT TERM GOAL #2   Title  The patient will report a 30% improvement in shoulder pain with grooming/dressing tasks    Time  4    Period  Weeks    Status  On-going      PT SHORT TERM GOAL #3   Title  Right shoulder active or active assisted elevation to 80  degrees needed for home and work ADLs in sitting    Status  Achieved      PT SHORT TERM GOAL #4   Title  ...    Time  4    Period  Weeks        PT Long Term Goals - 08/18/19 1221      PT LONG TERM GOAL #1   Title  The patient will be independent in safe self progression of HEP  for further improvements in ROM and strength    Time  6    Period  Weeks    Status  On-going    Target Date  09/26/19      PT LONG TERM GOAL #2   Title  The patient will have improved right shoulder elevation to 110 degrees needed for reaching eye level shelves and personal care    Time  6    Period  Weeks    Status  On-going      PT LONG TERM GOAL #3   Title  The patient will have improved shoulder pain with  usual ADLs by at least 80%  Time  6    Period  Weeks    Status  On-going      PT LONG TERM GOAL #4   Title  right shoulder abduction >/= 100 degrees to reach out for an item    Time  8    Period  Weeks    Status  On-going      PT LONG TERM GOAL #5   Title  The patient will have grossly 3+/5 to 4-/5 strength in deltoids and scapula stabilizers needed for light lifting needed at home and work    Time  8    Period  Weeks    Status   On-going      Additional Long Term Goals   Additional Long Term Goals  Yes      PT LONG TERM GOAL #6   Title  The patient will report a 60% reduction in left hip pain with rising sit to stand, walking > 2 blocks    Time  6    Period  Weeks    Status  New    Target Date  09/26/19      PT LONG TERM GOAL #7   Title  The patient will have grossly 4/5 to 4+/5 left hip strength and single leg stand > 6 sec    Time  6    Period  Weeks    Status  New    Target Date  09/26/19            Plan - 08/25/19 [id number]    Clinical Impression Statement  The patient has increased shoulder pain with supine (gravity assisted position) than usual today and needs modifications of bent elbow and ROM limitations to avoid painful range.  Treatment modified to include dynamic isometrics and other shoulder strengthening for crossover effect secondary to right shoulder pain sensitivity today.  Left hip ROM much improved since initial assessment.  Able to initiate gluteal strengthening without production of pain.    Comorbidities  osteoarthritis knees, shoulder;  HTN    Examination-Activity Limitations  Reach Overhead;Self Feeding;Dressing;Lift;Carry;Locomotion Level    PT Frequency  3x / week    PT Duration  6 weeks    PT Treatment/Interventions  ADLs/Self Care Home Management;Electrical Stimulation;Cryotherapy;Moist Heat;Therapeutic activities;Therapeutic exercise;Neuromuscular re-education;Manual techniques;Patient/family education;Taping;Vasopneumatic Device;Dry needling;Scar mobilization;Iontophoresis 4mg /ml Dexamethasone    PT Next Visit Plan  KX;  check AROM; try UE Ranger on wall;   doorway hip flexor/ITB on left;  ROM to right shoulder within tolerance; supine or slight elevation 1# rhythmic stab and small arcs of movement; dynamic shoulder isometrics, scapula strength, soft tissue work to right shoulder;  vasocompression    PT Home Exercise Plan  Access Code: [email or web address]       Patient will benefit from  skilled therapeutic intervention in order to improve the following deficits and impairments:  Decreased range of motion, Impaired UE functional use, Decreased activity tolerance, Impaired perceived functional ability, Pain, Decreased strength  Visit Diagnosis: Acute pain of right shoulder  Stiffness of right shoulder, not elsewhere classified  Muscle weakness (generalized)  Pain in left hip     Problem List Patient Active Problem List   Diagnosis Date Noted  . Piriformis syndrome of left side 08/18/2019  . Rotator cuff tear, right 05/29/2019  . Closed fracture of distal clavicle 05/15/2019  . 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   [name], PT 08/25/19 9:06 AM Phone: [phone] Fax: [phone] [name] 08/25/2019, 9:06 AM  [facility] [address] [place], [place], [address] Phone: [phone]   Fax:  [phone]  Name: Cory Barnett MRN: [id number] Date of Birth: 28-Dec-1945

## 2019-08-25 NOTE — Patient Instructions (Signed)
Access Code: [email or web address]  URL: [email or web address]  Date: 08/25/2019  Prepared by: [name]   Exercises  Supine Shoulder Flexion AAROM with Hands Clasped - 5 reps - 1 sets - 1-2x daily - 7x weekly  Isometric Shoulder External Rotation with Ball at [employer] - 10 reps - 1 sets - 5 sec hold - 2x daily - 7x weekly  Isometric Shoulder Extension with Ball at [employer] - 10 reps - 1 sets - 5 sec hold - 2x daily - 7x weekly  Isometric Shoulder Flexion with Ball at [employer] - 10 reps - 1 sets - 5 sec hold - 2x daily - 7x weekly  Isometric Shoulder Abduction with Ball at [employer] - 10 reps - 1 sets - 5 sec hold - 2x daily - 7x weekly  Seated Piriformis Stretch with Trunk Bend - 3 reps - 1 sets - 30 hold - 1x daily - 7x weekly  Seated Hamstring Stretch - 3 reps - 1 sets - 30 hold - 1x daily - 7x weekly  Hip Extension with Resistance Loop - 10 reps - 1 sets - 1x daily - 7x weekly  Hip Abduction with Resistance Loop - 10 reps - 1 sets - 1x daily - 7x weekly  Standing Hip Abduction with Anchored Resistance - 10 reps - 1 sets - 1x daily - 7x weekly

## 2019-08-29 ENCOUNTER — Other Ambulatory Visit: Payer: Self-pay

## 2019-08-29 ENCOUNTER — Ambulatory Visit: Payer: Medicare Other | Admitting: Physical Therapy

## 2019-08-29 DIAGNOSIS — M25511 Pain in right shoulder: Secondary | ICD-10-CM | POA: Diagnosis not present

## 2019-08-29 DIAGNOSIS — M25611 Stiffness of right shoulder, not elsewhere classified: Secondary | ICD-10-CM

## 2019-08-29 DIAGNOSIS — M25552 Pain in left hip: Secondary | ICD-10-CM | POA: Diagnosis not present

## 2019-08-29 DIAGNOSIS — M6281 Muscle weakness (generalized): Secondary | ICD-10-CM | POA: Diagnosis not present

## 2019-08-29 NOTE — Therapy (Signed)
[facility] [address], [place], [place], [address] Phone: [phone]   Fax:  [phone]  Physical Therapy Treatment  Patient Details  Name: Cory Barnett MRN: [id number] Date of Birth: Aug 01, 1946 Referring Provider (PT): Dr. Tania Ade   Encounter Date: 08/29/2019  PT End of Session - 08/29/19 1733    Visit Number  7    Date for PT Re-Evaluation  09/26/19    Authorization Type  Medicare KX now secondary to previous PT this year;  10th visit progress note    PT Start Time  0805    PT Stop Time  0855    PT Time Calculation (min)  50 min    Activity Tolerance  Patient tolerated treatment well       Past Medical History:  Diagnosis Date  . Anxiety   . AV block, 1st degree   . Basal cell carcinoma   . Cardiac murmur   . Cataract   . Elevated PSA measurement 2018  . Hyperlipidemia   . Hypertension 2015  . 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 IFG (impaired fasting glucose) 01/2016  . Pre-diabetes   . RBBB (right bundle branch block)   . Rheumatic fever    age 73 or 89, in hospital for a month, out of school for a yr  . Seasonal allergies   . Squamous cell carcinoma     Past Surgical History:  Procedure Laterality Date  . NASAL SEPTUM SURGERY    . NOSE SURGERY    . SHOULDER ARTHROSCOPY WITH SUBACROMIAL DECOMPRESSION Right 08/07/2019   Procedure: SHOULDER ARTHROSCOPY WITH SUBACROMIAL DECOMPRESSION;  Surgeon: Tania Ade, MD;  Location: [facility];  Service: Orthopedics;  Laterality: Right;  . SKIN CANCER EXCISION     basal and squamous cell   . TONSILLECTOMY    . VASECTOMY    . WISDOM TOOTH EXTRACTION      There were no vitals filed for this visit.  Subjective Assessment - 08/29/19 1725    Subjective  My hip is doing fine so let's focus on my shoulder today.  It really bothered me over the weekend.  I power washed at work on Friday.    Patient Stated Goals  improve strength,  function and ROM    Currently in Pain?  Yes    Pain Score  2     Pain Location  Shoulder    Pain Orientation  Right    Multiple Pain Sites  Yes    Pain Score  0    Pain Location  Hip    Pain Orientation  Left    Pain Type  Acute pain         [facility] PT Assessment - 08/29/19 0001      AROM   Right Shoulder Flexion  92 Degrees    Right Shoulder ABduction  80 Degrees    Right Shoulder Internal Rotation  --   T10   Right Shoulder External Rotation  35 Degrees   supine 60 degrees                  [facility] Adult PT Treatment/Exercise - 08/29/19 0001      Shoulder Exercises: Supine   Flexion  AROM;Right;5 reps    Flexion Limitations  pt uses other hand to lower arm down from elevated position, painful even with bent elbow       Shoulder Exercises: Standing   Other Standing Exercises  bent  over table 2# rows and extensions 15x each     Other Standing Exercises  bent over table small arc horizontal abduction with 1# 10x       Shoulder Exercises: ROM/Strengthening   Ranger  on wall L1 10x; L10 5x; L20 5x, L30 5x     Pushups  10 reps    Pushups Limitations  on wall     Other ROM/Strengthening Exercises  push ups on countertop 10x     Other ROM/Strengthening Exercises  attempted 2 hand red ball roll up wall but very painful especially with lowering so discontinued after 4 reps       Shoulder Exercises: Isometric Strengthening   Flexion  5X5"    Flexion Limitations  pressing palm into ball at 90 degrees;  Attempted circles but painful      Vasopneumatic   Number Minutes Vasopneumatic   15 minutes    Vasopnuematic Location   Shoulder   right   Vasopneumatic Pressure  Low    Vasopneumatic Temperature   3 snow flakes      Manual Therapy   Manual therapy comments  seated: place arm at 90 degrees, 100 and 110 degrees with holding 5 sec 2 rounds                PT Short Term Goals - 08/18/19 1220      PT SHORT TERM GOAL #1   Title  independent with initial HEP     Time  3    Period  Weeks    Status  Achieved      PT SHORT TERM GOAL #2   Title  The patient will report a 30% improvement in shoulder pain with grooming/dressing tasks    Time  4    Period  Weeks    Status  On-going      PT SHORT TERM GOAL #3   Title  Right shoulder active or active assisted elevation to 80  degrees needed for home and work ADLs in sitting    Status  Achieved      PT SHORT TERM GOAL #4   Title  ...    Time  4    Period  Weeks        PT Long Term Goals - 08/18/19 1221      PT LONG TERM GOAL #1   Title  The patient will be independent in safe self progression of HEP  for further improvements in ROM and strength    Time  6    Period  Weeks    Status  On-going    Target Date  09/26/19      PT LONG TERM GOAL #2   Title  The patient will have improved right shoulder elevation to 110 degrees needed for reaching eye level shelves and personal care    Time  6    Period  Weeks    Status  On-going      PT LONG TERM GOAL #3   Title  The patient will have improved shoulder pain with  usual ADLs by at least 80%    Time  6    Period  Weeks    Status  On-going      PT LONG TERM GOAL #4   Title  right shoulder abduction >/= 100 degrees to reach out for an item    Time  8    Period  Weeks    Status  On-going      PT  LONG TERM GOAL #5   Title  The patient will have grossly 3+/5 to 4-/5 strength in deltoids and scapula stabilizers needed for light lifting needed at home and work    Time  8    Period  Weeks    Status  On-going      Additional Long Term Goals   Additional Long Term Goals  Yes      PT LONG TERM GOAL #6   Title  The patient will report a 60% reduction in left hip pain with rising sit to stand, walking > 2 blocks    Time  6    Period  Weeks    Status  New    Target Date  09/26/19      PT LONG TERM GOAL #7   Title  The patient will have grossly 4/5 to 4+/5 left hip strength and single leg stand > 6 sec    Time  6    Period  Weeks     Status  New    Target Date  09/26/19            Plan - 08/29/19 1733    Clinical Impression Statement  Much improved shoulder flexion and abduction active ROM with compensatory patterns as expected secondary to rotator cuff deficiency.  He is able to progress with standing (against gravity) exercises with some modifications to exercises needed when excessive pain with lowering occurs.  He did well with UE Ranger on wall with low repetitions secondary to muscular fatigue.  His left hip pain was doing well and he wanted to focus on his shoulder today but may need treatment on this area next time.    Comorbidities  osteoarthritis knees, shoulder;  HTN    Examination-Activity Limitations  Reach Overhead;Self Feeding;Dressing;Lift;Carry;Locomotion Level    Examination-Participation Restrictions  Community Activity;Other    Rehab Potential  Good    PT Frequency  3x / week    PT Duration  6 weeks    PT Treatment/Interventions  ADLs/Self Care Home Management;Electrical Stimulation;Cryotherapy;Moist Heat;Therapeutic activities;Therapeutic exercise;Neuromuscular re-education;Manual techniques;Patient/family education;Taping;Vasopneumatic Device;Dry needling;Scar mobilization;Iontophoresis 4mg /ml Dexamethasone    PT Next Visit Plan  KX;   UE Ranger on wall;  try UBE; DN left piriformis/gluteals as needed;    flexor/ITB on left;  ROM and strengthening (periscap, deltoid, biceps, triceps) as tolerated  for rotator cuff deficient right shoulder;   vasocompression    PT Home Exercise Plan  Access Code: [email or web address]       Patient will benefit from skilled therapeutic intervention in order to improve the following deficits and impairments:  Decreased range of motion, Impaired UE functional use, Decreased activity tolerance, Impaired perceived functional ability, Pain, Decreased strength  Visit Diagnosis: Acute pain of right shoulder  Stiffness of right shoulder, not elsewhere classified  Muscle weakness  (generalized)     Problem List Patient Active Problem List   Diagnosis Date Noted  . Piriformis syndrome of left side 08/18/2019  . Rotator cuff tear, right 05/29/2019  . Closed fracture of distal clavicle 05/15/2019  . 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   [name], PT 08/29/19 5:43 PM Phone: [phone] Fax: [phone] [name] 08/29/2019, 5:41 PM  [facility] [address], [place], [place], [address] Phone: [phone]   Fax:  [phone]  Name: KOHEI BOORTZ MRN: [id number] Date of Birth: 02-Nov-1946

## 2019-08-30 ENCOUNTER — Ambulatory Visit: Payer: Medicare Other

## 2019-08-30 ENCOUNTER — Other Ambulatory Visit: Payer: Self-pay

## 2019-08-30 DIAGNOSIS — M25611 Stiffness of right shoulder, not elsewhere classified: Secondary | ICD-10-CM | POA: Diagnosis not present

## 2019-08-30 DIAGNOSIS — M6281 Muscle weakness (generalized): Secondary | ICD-10-CM

## 2019-08-30 DIAGNOSIS — M25511 Pain in right shoulder: Secondary | ICD-10-CM

## 2019-08-30 DIAGNOSIS — M25552 Pain in left hip: Secondary | ICD-10-CM | POA: Diagnosis not present

## 2019-08-30 NOTE — Therapy (Signed)
[facility] [address] [place], [place], [address] Phone: [phone]   Fax:  [phone]  Physical Therapy Treatment  Patient Details  Name: Cory Barnett MRN: [id number] Date of Birth: [birth year] Referring Provider (PT): Dr. Tania Ade   Encounter Date: 08/30/2019  PT End of Session - 08/30/19 0849    Visit Number  8    Date for PT Re-Evaluation  09/26/19    Authorization Type  Medicare KX now secondary to previous PT this year;  10th visit progress note    PT Start Time  0803    PT Stop Time  0859    PT Time Calculation (min)  56 min    Activity Tolerance  Patient tolerated treatment well    Behavior During Therapy  [facility] for tasks assessed/performed       Past Medical History:  Diagnosis Date  . Anxiety   . AV block, 1st degree   . Basal cell carcinoma   . Cardiac murmur   . Cataract   . Elevated PSA measurement 2018  . Hyperlipidemia   . Hypertension 2015  . 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 IFG (impaired fasting glucose) 01/2016  . Pre-diabetes   . RBBB (right bundle branch block)   . Rheumatic fever    age 75 or 57, in hospital for a month, out of school for a yr  . Seasonal allergies   . Squamous cell carcinoma     Past Surgical History:  Procedure Laterality Date  . NASAL SEPTUM SURGERY    . NOSE SURGERY    . SHOULDER ARTHROSCOPY WITH SUBACROMIAL DECOMPRESSION Right 08/07/2019   Procedure: SHOULDER ARTHROSCOPY WITH SUBACROMIAL DECOMPRESSION;  Surgeon: Tania Ade, MD;  Location: [facility];  Service: Orthopedics;  Laterality: Right;  . SKIN CANCER EXCISION     basal and squamous cell   . TONSILLECTOMY    . VASECTOMY    . WISDOM TOOTH EXTRACTION      There were no vitals filed for this visit.  Subjective Assessment - 08/30/19 0805    Subjective  I did too much yesterday after I left here.    Currently in Pain?  Yes    Pain Score  2     Pain Location  Shoulder     Pain Orientation  Right    Pain Descriptors / Indicators  Sharp    Pain Type  Acute pain    Pain Onset  1 to 4 weeks ago    Pain Frequency  Intermittent    Aggravating Factors   waking in the morning, overhead movement    Pain Relieving Factors  rest, ice                       [facility] Adult PT Treatment/Exercise - 08/30/19 0001      Shoulder Exercises: Supine   Flexion  AROM;Right;10 reps    Flexion Limitations  chest press with cane      Shoulder Exercises: Sidelying   Flexion  Strengthening;Right;10 reps      Shoulder Exercises: Standing   Other Standing Exercises  wall ladder 2x10 flexion      Shoulder Exercises: Pulleys   Flexion  3 minutes      Shoulder Exercises: ROM/Strengthening   Ranger  on wall L1 10x; L10 5x; L20 5x, L30 5x       Vasopneumatic   Number Minutes Vasopneumatic   15 minutes  Vasopnuematic Location   Shoulder   right   Vasopneumatic Pressure  Low    Vasopneumatic Temperature   3 snow flakes      Manual Therapy   Manual Therapy  Passive ROM    Passive ROM  gentle P/ROM in all directions                PT Short Term Goals - 08/18/19 1220      PT SHORT TERM GOAL #1   Title  independent with initial HEP    Time  3    Period  Weeks    Status  Achieved      PT SHORT TERM GOAL #2   Title  The patient will report a 30% improvement in shoulder pain with grooming/dressing tasks    Time  4    Period  Weeks    Status  On-going      PT SHORT TERM GOAL #3   Title  Right shoulder active or active assisted elevation to 80  degrees needed for home and work ADLs in sitting    Status  Achieved      PT SHORT TERM GOAL #4   Title  ...    Time  4    Period  Weeks        PT Long Term Goals - 08/18/19 1221      PT LONG TERM GOAL #1   Title  The patient will be independent in safe self progression of HEP  for further improvements in ROM and strength    Time  6    Period  Weeks    Status  On-going    Target Date   09/26/19      PT LONG TERM GOAL #2   Title  The patient will have improved right shoulder elevation to 110 degrees needed for reaching eye level shelves and personal care    Time  6    Period  Weeks    Status  On-going      PT LONG TERM GOAL #3   Title  The patient will have improved shoulder pain with  usual ADLs by at least 80%    Time  6    Period  Weeks    Status  On-going      PT LONG TERM GOAL #4   Title  right shoulder abduction >/= 100 degrees to reach out for an item    Time  8    Period  Weeks    Status  On-going      PT LONG TERM GOAL #5   Title  The patient will have grossly 3+/5 to 4-/5 strength in deltoids and scapula stabilizers needed for light lifting needed at home and work    Time  8    Period  Weeks    Status  On-going      Additional Long Term Goals   Additional Long Term Goals  Yes      PT LONG TERM GOAL #6   Title  The patient will report a 60% reduction in left hip pain with rising sit to stand, walking > 2 blocks    Time  6    Period  Weeks    Status  New    Target Date  09/26/19      PT LONG TERM GOAL #7   Title  The patient will have grossly 4/5 to 4+/5 left hip strength and single leg stand > 6 sec  Time  6    Period  Weeks    Status  New    Target Date  09/26/19            Plan - 08/30/19 0844    Clinical Impression Statement  PT with increased Rt shoulder pain today due to overusing the Rt arm yesterday after therapy.  Pt with pain in the Rt shoulder with movement against gravity.  PT modified exercises to reduce pain.   Pt with improved shoulder flexion and abduction active ROM with week with compensatory patterns as expected secondary to rotator cuff deficiency.   He did well with finger ladder with reduced scapular compensation with flexion.  Pt with good P/ROM of the Rt shoulder with minimal pain.    PT Frequency  3x / week    PT Duration  6 weeks    PT Treatment/Interventions  ADLs/Self Care Home Management;Electrical  Stimulation;Cryotherapy;Moist Heat;Therapeutic activities;Therapeutic exercise;Neuromuscular re-education;Manual techniques;Patient/family education;Taping;Vasopneumatic Device;Dry needling;Scar mobilization;Iontophoresis 4mg /ml Dexamethasone    PT Next Visit Plan  KX;   UE Ranger on wall;  try UBE if pt is not too sore,  ROM and strengthening (periscap, deltoid, biceps, triceps) as tolerated  for rotator cuff deficient right shoulder;   vasocompression    PT Home Exercise Plan  Access Code: [email or web address]    Consulted and Agree with Plan of Care  Patient       Patient will benefit from skilled therapeutic intervention in order to improve the following deficits and impairments:  Decreased range of motion, Impaired UE functional use, Decreased activity tolerance, Impaired perceived functional ability, Pain, Decreased strength  Visit Diagnosis: Stiffness of right shoulder, not elsewhere classified  Muscle weakness (generalized)  Acute pain of right shoulder     Problem List Patient Active Problem List   Diagnosis Date Noted  . Piriformis syndrome of left side 08/18/2019  . Rotator cuff tear, right 05/29/2019  . Closed fracture of distal clavicle 05/15/2019  . 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    Cory Barnett, PT 08/30/19 8:52 AM  [facility] [address], [place], [place], [address] Phone: [phone]   Fax:  [phone]  Name: Cory Barnett MRN: [id number] Date of Birth: 1946-02-28

## 2019-09-01 ENCOUNTER — Other Ambulatory Visit: Payer: Self-pay

## 2019-09-01 ENCOUNTER — Encounter: Payer: Self-pay | Admitting: Physical Therapy

## 2019-09-01 ENCOUNTER — Ambulatory Visit: Payer: Medicare Other | Admitting: Physical Therapy

## 2019-09-01 DIAGNOSIS — M25552 Pain in left hip: Secondary | ICD-10-CM

## 2019-09-01 DIAGNOSIS — M6281 Muscle weakness (generalized): Secondary | ICD-10-CM | POA: Diagnosis not present

## 2019-09-01 DIAGNOSIS — M25611 Stiffness of right shoulder, not elsewhere classified: Secondary | ICD-10-CM

## 2019-09-01 DIAGNOSIS — M25511 Pain in right shoulder: Secondary | ICD-10-CM | POA: Diagnosis not present

## 2019-09-01 NOTE — Therapy (Signed)
[facility] [address], [place], [place], [address] Phone: [phone]   Fax:  [phone]  Physical Therapy Treatment  Patient Details  Name: Cory Barnett MRN: [id number] Date of Birth: 01-06-1946 Referring Provider (PT): Dr. Tania Ade   Encounter Date: 09/01/2019  PT End of Session - 09/01/19 0924    Visit Number  9    Date for PT Re-Evaluation  09/26/19    Authorization Type  Medicare KX now secondary to previous PT this year;  10th visit progress note    PT Start Time  0844    PT Stop Time  0933    PT Time Calculation (min)  49 min    Activity Tolerance  Patient limited by pain       Past Medical History:  Diagnosis Date  . Anxiety   . AV block, 1st degree   . Basal cell carcinoma   . Cardiac murmur   . Cataract   . Elevated PSA measurement 2018  . Hyperlipidemia   . Hypertension 2015  . 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 IFG (impaired fasting glucose) 01/2016  . Pre-diabetes   . RBBB (right bundle branch block)   . Rheumatic fever    age 2 or 39, in hospital for a month, out of school for a yr  . Seasonal allergies   . Squamous cell carcinoma     Past Surgical History:  Procedure Laterality Date  . NASAL SEPTUM SURGERY    . NOSE SURGERY    . SHOULDER ARTHROSCOPY WITH SUBACROMIAL DECOMPRESSION Right 08/07/2019   Procedure: SHOULDER ARTHROSCOPY WITH SUBACROMIAL DECOMPRESSION;  Surgeon: Tania Ade, MD;  Location: [facility];  Service: Orthopedics;  Laterality: Right;  . SKIN CANCER EXCISION     basal and squamous cell   . TONSILLECTOMY    . VASECTOMY    . WISDOM TOOTH EXTRACTION      There were no vitals filed for this visit.  Subjective Assessment - 09/01/19 0843    Subjective  I've had a lot of shoulder pain this week.  My hip is doing OK.  I've been using ice a lot.  Used TENS 1x.    Currently in Pain?  Yes    Pain Score  4     Pain Location  Shoulder    Pain  Orientation  Right    Pain Type  Surgical pain    Aggravating Factors   general use                       [facility] Adult PT Treatment/Exercise - 09/01/19 0001      Shoulder Exercises: Supine   Protraction  AAROM;Right;10 reps    Protraction Limitations  UE Ranger assist     External Rotation  AAROM;Right;15 reps    External Rotation Limitations  UE Ranger assist     Flexion  AAROM;Right;15 reps    Flexion Limitations  UE Ranger assist     Other Supine Exercises  from 90 degrees assisted position 12:00/6:00 small arcs 10x     Other Supine Exercises  9:00/3:00 from 90 degrees small arcs 10x       Shoulder Exercises: Standing   Extension  Strengthening    Theraband Level (Shoulder Extension)  Level 3 (Green)    Row  Strengthening;Right;20 reps;Theraband    Theraband Level (Shoulder Row)  Level 3 (Green)    Other Standing Exercises  biceps green band  20x    Other Standing Exercises  triceps green band 20x       Shoulder Exercises: ROM/Strengthening   Ranger  on wall L1 10x; L10 10x    Other ROM/Strengthening Exercises  ball roll on railing 10x       Vasopneumatic   Number Minutes Vasopneumatic   10 minutes    Vasopnuematic Location   Shoulder   right   Vasopneumatic Pressure  Low    Vasopneumatic Temperature   3 snow flakes               PT Short Term Goals - 08/18/19 1220      PT SHORT TERM GOAL #1   Title  independent with initial HEP    Time  3    Period  Weeks    Status  Achieved      PT SHORT TERM GOAL #2   Title  The patient will report a 30% improvement in shoulder pain with grooming/dressing tasks    Time  4    Period  Weeks    Status  On-going      PT SHORT TERM GOAL #3   Title  Right shoulder active or active assisted elevation to 80  degrees needed for home and work ADLs in sitting    Status  Achieved      PT SHORT TERM GOAL #4   Title  ...    Time  4    Period  Weeks        PT Long Term Goals - 08/18/19 1221      PT LONG  TERM GOAL #1   Title  The patient will be independent in safe self progression of HEP  for further improvements in ROM and strength    Time  6    Period  Weeks    Status  On-going    Target Date  09/26/19      PT LONG TERM GOAL #2   Title  The patient will have improved right shoulder elevation to 110 degrees needed for reaching eye level shelves and personal care    Time  6    Period  Weeks    Status  On-going      PT LONG TERM GOAL #3   Title  The patient will have improved shoulder pain with  usual ADLs by at least 80%    Time  6    Period  Weeks    Status  On-going      PT LONG TERM GOAL #4   Title  right shoulder abduction >/= 100 degrees to reach out for an item    Time  8    Period  Weeks    Status  On-going      PT LONG TERM GOAL #5   Title  The patient will have grossly 3+/5 to 4-/5 strength in deltoids and scapula stabilizers needed for light lifting needed at home and work    Time  8    Period  Weeks    Status  On-going      Additional Long Term Goals   Additional Long Term Goals  Yes      PT LONG TERM GOAL #6   Title  The patient will report a 60% reduction in left hip pain with rising sit to stand, walking > 2 blocks    Time  6    Period  Weeks    Status  New    Target Date  09/26/19      PT LONG TERM GOAL #7   Title  The patient will have grossly 4/5 to 4+/5 left hip strength and single leg stand > 6 sec    Time  6    Period  Weeks    Status  New    Target Date  09/26/19            Plan - 09/01/19 [id number]    Clinical Impression Statement  Treatment modified extensively secondary to increased shoulder pain today.  Decreased intensity with less resistance and fewer reps with exercises.  Minimal pain with performing ex's with active- assisted method.   We discussed decreasing frequency to 2x next week secondary to increased pain this week, possibly overuse.  Therapist closely monitoring response including grimacing as a cue to modify ex.     Comorbidities  osteoarthritis knees, shoulder;  HTN    Examination-Activity Limitations  Reach Overhead;Self Feeding;Dressing;Lift;Carry;Locomotion Level    Rehab Potential  Good    PT Frequency  3x / week    PT Duration  6 weeks    PT Treatment/Interventions  ADLs/Self Care Home Management;Electrical Stimulation;Cryotherapy;Moist Heat;Therapeutic activities;Therapeutic exercise;Neuromuscular re-education;Manual techniques;Patient/family education;Taping;Vasopneumatic Device;Dry needling;Scar mobilization;Iontophoresis 4mg /ml Dexamethasone    PT Next Visit Plan  10th visit progress note due;  KX;   decrease frequency to 2x next week;  UE Ranger on wall;  try UBE if pt is not too sore,  ROM and strengthening (periscap, deltoid, biceps, triceps) as tolerated  for rotator cuff deficient right shoulder;   vasocompression    PT Home Exercise Plan  Access Code: [email or web address]       Patient will benefit from skilled therapeutic intervention in order to improve the following deficits and impairments:  Decreased range of motion, Impaired UE functional use, Decreased activity tolerance, Impaired perceived functional ability, Pain, Decreased strength  Visit Diagnosis: Stiffness of right shoulder, not elsewhere classified  Muscle weakness (generalized)  Acute pain of right shoulder  Pain in left hip     Problem List Patient Active Problem List   Diagnosis Date Noted  . Piriformis syndrome of left side 08/18/2019  . Rotator cuff tear, right 05/29/2019  . Closed fracture of distal clavicle 05/15/2019  . 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   [name], PT 09/01/19 11:24 AM Phone: [phone] Fax: [phone] [name] 09/01/2019, 11:23 AM  [facility] [address] [place], [place], [address] Phone: [phone]   Fax:  [phone]  Name: Cory Barnett MRN: [id number] Date of Birth: 10-28-1946

## 2019-09-05 ENCOUNTER — Encounter: Payer: Self-pay | Admitting: Physical Therapy

## 2019-09-05 ENCOUNTER — Ambulatory Visit: Payer: Medicare Other | Attending: Orthopedic Surgery | Admitting: Physical Therapy

## 2019-09-05 ENCOUNTER — Other Ambulatory Visit: Payer: Self-pay

## 2019-09-05 DIAGNOSIS — R600 Localized edema: Secondary | ICD-10-CM | POA: Insufficient documentation

## 2019-09-05 DIAGNOSIS — M25611 Stiffness of right shoulder, not elsewhere classified: Secondary | ICD-10-CM

## 2019-09-05 DIAGNOSIS — M25552 Pain in left hip: Secondary | ICD-10-CM

## 2019-09-05 DIAGNOSIS — M25511 Pain in right shoulder: Secondary | ICD-10-CM

## 2019-09-05 DIAGNOSIS — M6281 Muscle weakness (generalized): Secondary | ICD-10-CM

## 2019-09-05 NOTE — Therapy (Signed)
[facility] [address] [place], [place], [address] Phone: [phone]   Fax:  [phone]  Physical Therapy Treatment  Patient Details  Name: Cory Barnett MRN: [id number] Date of Birth: 18-Sep-1946 Referring Provider (PT): Dr. Tania Ade   Progress Note Reporting Period 08/15/2019 to 09/05/2019  See note below for Objective Data and Assessment of Progress/Goals.       Encounter Date: 09/05/2019  PT End of Session - 09/05/19 1754    Visit Number  10    Date for PT Re-Evaluation  09/26/19    Authorization Type  Medicare KX now secondary to previous PT this year;  10th visit progress note    PT Start Time  0803    PT Stop Time  0846    PT Time Calculation (min)  43 min    Activity Tolerance  Patient tolerated treatment well       Past Medical History:  Diagnosis Date  . Anxiety   . AV block, 1st degree   . Basal cell carcinoma   . Cardiac murmur   . Cataract   . Elevated PSA measurement 2018  . Hyperlipidemia   . Hypertension 2015  . 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 IFG (impaired fasting glucose) 01/2016  . Pre-diabetes   . RBBB (right bundle branch block)   . Rheumatic fever    age 56 or 50, in hospital for a month, out of school for a yr  . Seasonal allergies   . Squamous cell carcinoma     Past Surgical History:  Procedure Laterality Date  . NASAL SEPTUM SURGERY    . NOSE SURGERY    . SHOULDER ARTHROSCOPY WITH SUBACROMIAL DECOMPRESSION Right 08/07/2019   Procedure: SHOULDER ARTHROSCOPY WITH SUBACROMIAL DECOMPRESSION;  Surgeon: Tania Ade, MD;  Location: [facility];  Service: Orthopedics;  Laterality: Right;  . SKIN CANCER EXCISION     basal and squamous cell   . TONSILLECTOMY    . VASECTOMY    . WISDOM TOOTH EXTRACTION      There were no vitals filed for this visit.  Subjective Assessment - 09/05/19 0804    Subjective  I didn't work yesterday so I'm going to take  it easy this week.    Pertinent History  elevation within visual periphery    Patient Stated Goals  improve strength, function and ROM    Currently in Pain?  Yes    Pain Score  2     Pain Location  Shoulder    Pain Type  Surgical pain    Pain Score  0    Pain Location  Hip    Pain Orientation  Left         [facility] PT Assessment - 09/05/19 0001      AROM   Right Shoulder Flexion  92 Degrees    Right Shoulder ABduction  80 Degrees    Right Shoulder Internal Rotation  --   T10   Right Shoulder External Rotation  35 Degrees   supine 60 degrees                  [facility] Adult PT Treatment/Exercise - 09/05/19 0001      Shoulder Exercises: Supine   Protraction  AAROM;Right;10 reps    Other Supine Exercises  from 100 degrees assisted position 12:00/6:00 small arcs 10x     Other Supine Exercises  9:00/3:00 from 90 degrees small arcs 10x; diagonals small arc  10 repsx 2; circles clockwise, counterclock at 100 degrees       Shoulder Exercises: Sidelying   External Rotation  AROM;Right;10 reps    Other Sidelying Exercises  flexion 0-30 degrees       Shoulder Exercises: Standing   Extension  Strengthening;15 reps    Theraband Level (Shoulder Extension)  Level 3 (Green)    Row  Strengthening;Right;20 reps;Theraband    Theraband Level (Shoulder Row)  Level 3 (Green)    Other Standing Exercises  biceps green band 20x    Other Standing Exercises  triceps green band 20x       Vasopneumatic   Number Minutes Vasopneumatic   10 minutes    Vasopnuematic Location   Shoulder   right   Vasopneumatic Pressure  Low    Vasopneumatic Temperature   3 snow flakes               PT Short Term Goals - 09/05/19 1802      PT SHORT TERM GOAL #1   Title  independent with initial HEP    Status  Achieved      PT SHORT TERM GOAL #2   Title  The patient will report a 30% improvement in shoulder pain with grooming/dressing tasks    Status  Achieved      PT SHORT TERM GOAL #3    Title  Right shoulder active or active assisted elevation to 80  degrees needed for home and work ADLs in sitting    Status  Achieved        PT Long Term Goals - 08/18/19 1221      PT LONG TERM GOAL #1   Title  The patient will be independent in safe self progression of HEP  for further improvements in ROM and strength    Time  6    Period  Weeks    Status  On-going    Target Date  09/26/19      PT LONG TERM GOAL #2   Title  The patient will have improved right shoulder elevation to 110 degrees needed for reaching eye level shelves and personal care    Time  6    Period  Weeks    Status  On-going      PT LONG TERM GOAL #3   Title  The patient will have improved shoulder pain with  usual ADLs by at least 80%    Time  6    Period  Weeks    Status  On-going      PT LONG TERM GOAL #4   Title  right shoulder abduction >/= 100 degrees to reach out for an item    Time  8    Period  Weeks    Status  On-going      PT LONG TERM GOAL #5   Title  The patient will have grossly 3+/5 to 4-/5 strength in deltoids and scapula stabilizers needed for light lifting needed at home and work    Time  8    Period  Weeks    Status  On-going      Additional Long Term Goals   Additional Long Term Goals  Yes      PT LONG TERM GOAL #6   Title  The patient will report a 60% reduction in left hip pain with rising sit to stand, walking > 2 blocks    Time  6    Period  Weeks    Status  New    Target Date  09/26/19      PT LONG TERM GOAL #7   Title  The patient will have grossly 4/5 to 4+/5 left hip strength and single leg stand > 6 sec    Time  6    Period  Weeks    Status  New    Target Date  09/26/19            Plan - 09/05/19 1755    Clinical Impression Statement  Treatment modified to avoid movements that aggravate pain.  Treatment focus on periscapular, biceps, triceps and serratus muscle groups.  States his hip is doing well and is sleeping  well.   Tactile and verbal cues to  keep arm positioned at 100 degrees in supine which is in the painfree zone.  When his arm drops below 90 he has increased pain.  May be slower to meet some goals secondary to exacerbation of pain levels last week secondary to overuse.  Following treatment session today, patient reports, "This is the best I've felt in a long time leaving PT."    Personal Factors and Comorbidities  Age;Comorbidity 1;Comorbidity 2    Comorbidities  osteoarthritis knees, shoulder;  HTN    Examination-Activity Limitations  Reach Overhead;Self Feeding;Dressing;Lift;Carry;Locomotion Level    Examination-Participation Restrictions  Community Activity;Other    Rehab Potential  Good    PT Frequency  3x / week    PT Duration  6 weeks    PT Treatment/Interventions  ADLs/Self Care Home Management;Electrical Stimulation;Cryotherapy;Moist Heat;Therapeutic activities;Therapeutic exercise;Neuromuscular re-education;Manual techniques;Patient/family education;Taping;Vasopneumatic Device;Dry needling;Scar mobilization;Iontophoresis 4mg /ml Dexamethasone    PT Next Visit Plan  KX;   decrease frequency to 2x next week;ROM and strengthening (periscap, deltoid, biceps, triceps) as tolerated  for rotator cuff deficient right shoulder;   vasocompression    PT Home Exercise Plan  Access Code: [email or web address]       Patient will benefit from skilled therapeutic intervention in order to improve the following deficits and impairments:  Decreased range of motion, Impaired UE functional use, Decreased activity tolerance, Impaired perceived functional ability, Pain, Decreased strength  Visit Diagnosis: Stiffness of right shoulder, not elsewhere classified  Muscle weakness (generalized)  Acute pain of right shoulder  Pain in left hip     Problem List Patient Active Problem List   Diagnosis Date Noted  . Piriformis syndrome of left side 08/18/2019  . Rotator cuff tear, right 05/29/2019  . Closed fracture of distal clavicle 05/15/2019  .  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   [name], PT 09/05/19 6:05 PM Phone: [phone] Fax: [phone] [name] 09/05/2019, 6:04 PM  [facility] [address] [place], [place], [address] Phone: [phone]   Fax:  [phone]  Name: Cory Barnett MRN: [id number] Date of Birth: May 05, 1946

## 2019-09-06 ENCOUNTER — Ambulatory Visit: Payer: Medicare Other

## 2019-09-08 ENCOUNTER — Encounter: Payer: Self-pay | Admitting: Physical Therapy

## 2019-09-08 ENCOUNTER — Ambulatory Visit: Payer: Medicare Other | Admitting: Physical Therapy

## 2019-09-08 ENCOUNTER — Other Ambulatory Visit: Payer: Self-pay

## 2019-09-08 DIAGNOSIS — M6281 Muscle weakness (generalized): Secondary | ICD-10-CM

## 2019-09-08 DIAGNOSIS — R600 Localized edema: Secondary | ICD-10-CM | POA: Diagnosis not present

## 2019-09-08 DIAGNOSIS — M25552 Pain in left hip: Secondary | ICD-10-CM | POA: Diagnosis not present

## 2019-09-08 DIAGNOSIS — M25511 Pain in right shoulder: Secondary | ICD-10-CM

## 2019-09-08 DIAGNOSIS — M25611 Stiffness of right shoulder, not elsewhere classified: Secondary | ICD-10-CM | POA: Diagnosis not present

## 2019-09-08 NOTE — Therapy (Signed)
[facility] [address], [place], [place], [address] Phone: [phone]   Fax:  [phone]  Physical Therapy Treatment  Patient Details  Name: Cory Barnett MRN: [id number] Date of Birth: Mar 12, 1946 Referring Provider (PT): Dr. Tania Ade   Encounter Date: 09/08/2019  PT End of Session - 09/08/19 0838    Visit Number  11    Date for PT Re-Evaluation  09/26/19    Authorization Type  Medicare KX now secondary to previous PT this year;  10th visit progress note    PT Start Time  0800    PT Stop Time  0847    PT Time Calculation (min)  47 min    Activity Tolerance  Patient tolerated treatment well       Past Medical History:  Diagnosis Date  . Anxiety   . AV block, 1st degree   . Basal cell carcinoma   . Cardiac murmur   . Cataract   . Elevated PSA measurement 2018  . Hyperlipidemia   . Hypertension 2015  . 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 IFG (impaired fasting glucose) 01/2016  . Pre-diabetes   . RBBB (right bundle branch block)   . Rheumatic fever    age 73 or 1, in hospital for a month, out of school for a yr  . Seasonal allergies   . Squamous cell carcinoma     Past Surgical History:  Procedure Laterality Date  . NASAL SEPTUM SURGERY    . NOSE SURGERY    . SHOULDER ARTHROSCOPY WITH SUBACROMIAL DECOMPRESSION Right 08/07/2019   Procedure: SHOULDER ARTHROSCOPY WITH SUBACROMIAL DECOMPRESSION;  Surgeon: Tania Ade, MD;  Location: [place];  Service: Orthopedics;  Laterality: Right;  . SKIN CANCER EXCISION     basal and squamous cell   . TONSILLECTOMY    . VASECTOMY    . WISDOM TOOTH EXTRACTION      There were no vitals filed for this visit.  Subjective Assessment - 09/08/19 0801    Subjective  It's been a better week.  I've backed off at work.  Hip doing pretty well.  I'm going to try walking this weekend.    Currently in Pain?  Yes    Pain Score  2     Pain Location   Shoulder    Pain Orientation  Right    Pain Score  0    Pain Location  Hip    Pain Orientation  Left                       [facility] Adult PT Treatment/Exercise - 09/08/19 0001      Knee/Hip Exercises: Stretches   Active Hamstring Stretch  Right;Left;3 reps    Active Hamstring Stretch Limitations  supine with strap with biases     Hip Flexor Stretch  Right;Left;5 reps    Hip Flexor Stretch Limitations  on 2nd step      Shoulder Exercises: Supine   Protraction  AROM;Right;10 reps    Protraction Limitations  tactile cues to stay at 100 degrees or above     Flexion  AROM;Right;15 reps    Other Supine Exercises  from 110 degrees assisted position 12:00/6:00 small arcs 10x     Other Supine Exercises  9:00/3:00 from 90 degrees small arcs 10x; diagonals small arc 10 repsx 2; circles clockwise, counterclock at 110 degrees       Shoulder Exercises: Sidelying   Other  Sidelying Exercises  on wall elevation 15x     Other Sidelying Exercises  12/6:00 and 9/3:00 at 90 degrees abduction       Shoulder Exercises: Standing   Extension  Strengthening;15 reps    Theraband Level (Shoulder Extension)  Level 3 (Green)    Row  Strengthening;Right;20 reps;Theraband    Theraband Level (Shoulder Row)  Level 3 (Green)    Other Standing Exercises  triceps green band 20x       Vasopneumatic   Number Minutes Vasopneumatic   10 minutes    Vasopnuematic Location   Shoulder   right   Vasopneumatic Pressure  Low    Vasopneumatic Temperature   3 snow flakes               PT Short Term Goals - 09/05/19 1802      PT SHORT TERM GOAL #1   Title  independent with initial HEP    Status  Achieved      PT SHORT TERM GOAL #2   Title  The patient will report a 30% improvement in shoulder pain with grooming/dressing tasks    Status  Achieved      PT SHORT TERM GOAL #3   Title  Right shoulder active or active assisted elevation to 80  degrees needed for home and work ADLs in sitting     Status  Achieved        PT Long Term Goals - 08/18/19 1221      PT LONG TERM GOAL #1   Title  The patient will be independent in safe self progression of HEP  for further improvements in ROM and strength    Time  6    Period  Weeks    Status  On-going    Target Date  09/26/19      PT LONG TERM GOAL #2   Title  The patient will have improved right shoulder elevation to 110 degrees needed for reaching eye level shelves and personal care    Time  6    Period  Weeks    Status  On-going      PT LONG TERM GOAL #3   Title  The patient will have improved shoulder pain with  usual ADLs by at least 80%    Time  6    Period  Weeks    Status  On-going      PT LONG TERM GOAL #4   Title  right shoulder abduction >/= 100 degrees to reach out for an item    Time  8    Period  Weeks    Status  On-going      PT LONG TERM GOAL #5   Title  The patient will have grossly 3+/5 to 4-/5 strength in deltoids and scapula stabilizers needed for light lifting needed at home and work    Time  8    Period  Weeks    Status  On-going      Additional Long Term Goals   Additional Long Term Goals  Yes      PT LONG TERM GOAL #6   Title  The patient will report a 60% reduction in left hip pain with rising sit to stand, walking > 2 blocks    Time  6    Period  Weeks    Status  New    Target Date  09/26/19      PT LONG TERM GOAL #7   Title  The patient  will have grossly 4/5 to 4+/5 left hip strength and single leg stand > 6 sec    Time  6    Period  Weeks    Status  New    Target Date  09/26/19            Plan - 09/08/19 [id number]    Clinical Impression Statement  The patient has a painful arc in supine at 90 degrees but painfree at 100 degrees or above.  He needs verbal and tactile cues to keep elevated at 100 or 110 degrees to avoid painful range.  He does very well in sidelying at 90 degrees without pain.   Verbal cues with hip stretches to avoid compensatory knee bend with HS stretching.   Therapist closely monitoring response and continually modifying ex's accordingly.  Recommend decreasing treatment frequency to 2x/week to avoid "overdoing it."    Examination-Activity Limitations  Reach Overhead;Self Feeding;Dressing;Lift;Carry;Locomotion Level    Examination-Participation Restrictions  Community Activity;Other    Rehab Potential  Good    PT Frequency  3x / week    PT Duration  6 weeks    PT Treatment/Interventions  ADLs/Self Care Home Management;Electrical Stimulation;Cryotherapy;Moist Heat;Therapeutic activities;Therapeutic exercise;Neuromuscular re-education;Manual techniques;Patient/family education;Taping;Vasopneumatic Device;Dry needling;Scar mobilization;Iontophoresis 4mg /ml Dexamethasone    PT Next Visit Plan  KX;   decrease frequency to 2x next week;ROM and strengthening (periscap, deltoid, biceps, triceps) as tolerated  for rotator cuff deficient right shoulder;  sidelying ROM;   vasocompression       Patient will benefit from skilled therapeutic intervention in order to improve the following deficits and impairments:  Decreased range of motion, Impaired UE functional use, Decreased activity tolerance, Impaired perceived functional ability, Pain, Decreased strength  Visit Diagnosis: Stiffness of right shoulder, not elsewhere classified  Muscle weakness (generalized)  Acute pain of right shoulder  Pain in left hip     Problem List Patient Active Problem List   Diagnosis Date Noted  . Piriformis syndrome of left side 08/18/2019  . Rotator cuff tear, right 05/29/2019  . Closed fracture of distal clavicle 05/15/2019  . 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   [name], PT 09/08/19 8:46 AM Phone: [phone] Fax: [phone] [name] 09/08/2019, 8:44 AM  [facility] [address] [place], [place], [address] Phone: [phone]   Fax:  [phone]  Name: Cory Barnett MRN: [id number] Date of Birth: 1946-05-27

## 2019-09-12 ENCOUNTER — Other Ambulatory Visit: Payer: Self-pay

## 2019-09-12 ENCOUNTER — Ambulatory Visit: Payer: Medicare Other | Admitting: Physical Therapy

## 2019-09-12 DIAGNOSIS — M25511 Pain in right shoulder: Secondary | ICD-10-CM

## 2019-09-12 DIAGNOSIS — M6281 Muscle weakness (generalized): Secondary | ICD-10-CM | POA: Diagnosis not present

## 2019-09-12 DIAGNOSIS — M25611 Stiffness of right shoulder, not elsewhere classified: Secondary | ICD-10-CM

## 2019-09-12 DIAGNOSIS — R600 Localized edema: Secondary | ICD-10-CM | POA: Diagnosis not present

## 2019-09-12 DIAGNOSIS — M25552 Pain in left hip: Secondary | ICD-10-CM | POA: Diagnosis not present

## 2019-09-12 NOTE — Therapy (Signed)
[facility] [address], [place], [place], [address] Phone: [phone]   Fax:  [phone]  Physical Therapy Treatment  Patient Details  Name: Cory Barnett MRN: [id number] Date of Birth: [birth year] Referring Provider (PT): Dr. Tania Ade   Encounter Date: 09/12/2019  PT End of Session - 09/12/19 0955    Visit Number  12    Date for PT Re-Evaluation  09/26/19    Authorization Type  Medicare KX now secondary to previous PT this year;  10th visit progress note    PT Start Time  0803    PT Stop Time  0844    PT Time Calculation (min)  41 min    Activity Tolerance  Patient tolerated treatment well       Past Medical History:  Diagnosis Date  . Anxiety   . AV block, 1st degree   . Basal cell carcinoma   . Cardiac murmur   . Cataract   . Elevated PSA measurement 2018  . Hyperlipidemia   . Hypertension 2015  . 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 IFG (impaired fasting glucose) 01/2016  . Pre-diabetes   . RBBB (right bundle branch block)   . Rheumatic fever    age 73 or 64, in hospital for a month, out of school for a yr  . Seasonal allergies   . Squamous cell carcinoma     Past Surgical History:  Procedure Laterality Date  . NASAL SEPTUM SURGERY    . NOSE SURGERY    . SHOULDER ARTHROSCOPY WITH SUBACROMIAL DECOMPRESSION Right 08/07/2019   Procedure: SHOULDER ARTHROSCOPY WITH SUBACROMIAL DECOMPRESSION;  Surgeon: Tania Ade, MD;  Location: [place];  Service: Orthopedics;  Laterality: Right;  . SKIN CANCER EXCISION     basal and squamous cell   . TONSILLECTOMY    . VASECTOMY    . WISDOM TOOTH EXTRACTION      There were no vitals filed for this visit.  Subjective Assessment - 09/12/19 0806    Subjective  Took it easy.  Got some leaves up yesterday.  Hips is pretty good.   It's this shoulder.  Took 2 Advil this morning.    Currently in Pain?  No/denies    Pain Score  0-No pain    Pain Location  Shoulder    Pain Orientation  Right    Pain Type  Surgical pain    Pain Score  0    Pain Location  Hip                       [facility] Adult PT Treatment/Exercise - 09/12/19 0001      Shoulder Exercises: Prone   Extension  AROM;Right;10 reps    Other Prone Exercises  pendulums 2 min     Other Prone Exercises  ball roll 15x, stool roll 15x       Shoulder Exercises: Sidelying   Flexion  AAROM;Right;20 reps    Flexion Limitations  with UE Ranger;  too painful without assist     Other Sidelying Exercises  12/6:00 and 9/3:00 at 90 degrees abduction       Shoulder Exercises: Standing   Extension  Strengthening;Right;20 reps;Weights    Extension Weight (lbs)  30    Row  Strengthening;Right;20 reps;Weights    Row Weight (lbs)  25    Other Standing Exercises  triceps 25# 25x      Vasopneumatic   Number Minutes Vasopneumatic  10 minutes    Vasopnuematic Location   Shoulder   right   Vasopneumatic Pressure  Low    Vasopneumatic Temperature   3 snow flakes               PT Short Term Goals - 09/05/19 1802      PT SHORT TERM GOAL #1   Title  independent with initial HEP    Status  Achieved      PT SHORT TERM GOAL #2   Title  The patient will report a 30% improvement in shoulder pain with grooming/dressing tasks    Status  Achieved      PT SHORT TERM GOAL #3   Title  Right shoulder active or active assisted elevation to 80  degrees needed for home and work ADLs in sitting    Status  Achieved        PT Long Term Goals - 08/18/19 1221      PT LONG TERM GOAL #1   Title  The patient will be independent in safe self progression of HEP  for further improvements in ROM and strength    Time  6    Period  Weeks    Status  On-going    Target Date  09/26/19      PT LONG TERM GOAL #2   Title  The patient will have improved right shoulder elevation to 110 degrees needed for reaching eye level shelves and personal care    Time  6    Period   Weeks    Status  On-going      PT LONG TERM GOAL #3   Title  The patient will have improved shoulder pain with  usual ADLs by at least 80%    Time  6    Period  Weeks    Status  On-going      PT LONG TERM GOAL #4   Title  right shoulder abduction >/= 100 degrees to reach out for an item    Time  8    Period  Weeks    Status  On-going      PT LONG TERM GOAL #5   Title  The patient will have grossly 3+/5 to 4-/5 strength in deltoids and scapula stabilizers needed for light lifting needed at home and work    Time  8    Period  Weeks    Status  On-going      Additional Long Term Goals   Additional Long Term Goals  Yes      PT LONG TERM GOAL #6   Title  The patient will report a 60% reduction in left hip pain with rising sit to stand, walking > 2 blocks    Time  6    Period  Weeks    Status  New    Target Date  09/26/19      PT LONG TERM GOAL #7   Title  The patient will have grossly 4/5 to 4+/5 left hip strength and single leg stand > 6 sec    Time  6    Period  Weeks    Status  New    Target Date  09/26/19            Plan - 09/12/19 0956    Clinical Impression Statement  Patient's response closely monitored and exercises modified secondary to pain.  More discomfort in sidelying than last visit but able to perform flexion in this position with minimal  pain using active assisted ROM with UE Ranger.  Prone exercises tolerated fairly well although flexion active assisted is slightly uncomfortable and lacks control.  Improving scapular and shoulder extensor strength with increased resistance added.  Patient declines the need for PT intervention for hip today.    Examination-Activity Limitations  Reach Overhead;Self Feeding;Dressing;Lift;Carry;Locomotion Level    Rehab Potential  Good    PT Duration  6 weeks    PT Treatment/Interventions  ADLs/Self Care Home Management;Electrical Stimulation;Cryotherapy;Moist Heat;Therapeutic activities;Therapeutic exercise;Neuromuscular  re-education;Manual techniques;Patient/family education;Taping;Vasopneumatic Device;Dry needling;Scar mobilization;Iontophoresis 4mg /ml Dexamethasone    PT Next Visit Plan  KX;  check ROM measurements next visit;   decrease frequency to 2x next week;ROM and strengthening (periscap, deltoid, biceps, triceps) as tolerated  for rotator cuff deficient right shoulder;  sidelying ROM;   vasocompression    PT Home Exercise Plan  Access Code: [email or web address]       Patient will benefit from skilled therapeutic intervention in order to improve the following deficits and impairments:  Decreased range of motion, Impaired UE functional use, Decreased activity tolerance, Impaired perceived functional ability, Pain, Decreased strength  Visit Diagnosis: Stiffness of right shoulder, not elsewhere classified  Muscle weakness (generalized)  Acute pain of right shoulder     Problem List Patient Active Problem List   Diagnosis Date Noted  . Piriformis syndrome of left side 08/18/2019  . Rotator cuff tear, right 05/29/2019  . Closed fracture of distal clavicle 05/15/2019  . 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   [name], PT 09/12/19 10:01 AM Phone: [phone] Fax: [phone] [name] 09/12/2019, 10:01 AM  [facility] [address] [place], [place], [address] Phone: [phone]   Fax:  [phone]  Name: Cory Barnett MRN: [id number] Date of Birth: [birth year]

## 2019-09-14 ENCOUNTER — Encounter: Payer: Medicare Other | Admitting: Physical Therapy

## 2019-09-15 ENCOUNTER — Ambulatory Visit: Payer: Medicare Other | Admitting: Physical Therapy

## 2019-09-15 ENCOUNTER — Other Ambulatory Visit: Payer: Self-pay

## 2019-09-15 ENCOUNTER — Encounter: Payer: Self-pay | Admitting: Physical Therapy

## 2019-09-15 DIAGNOSIS — M6281 Muscle weakness (generalized): Secondary | ICD-10-CM

## 2019-09-15 DIAGNOSIS — R600 Localized edema: Secondary | ICD-10-CM | POA: Diagnosis not present

## 2019-09-15 DIAGNOSIS — M25552 Pain in left hip: Secondary | ICD-10-CM | POA: Diagnosis not present

## 2019-09-15 DIAGNOSIS — M25611 Stiffness of right shoulder, not elsewhere classified: Secondary | ICD-10-CM

## 2019-09-15 DIAGNOSIS — M25511 Pain in right shoulder: Secondary | ICD-10-CM | POA: Diagnosis not present

## 2019-09-15 NOTE — Therapy (Signed)
[facility] [address], [place], [place], [address] Phone: [phone]   Fax:  [phone]  Physical Therapy Treatment  Patient Details  Name: Cory Barnett MRN: [id number] Date of Birth: 1946-05-20 Referring Provider (PT): Dr. Tania Ade   Encounter Date: 09/15/2019  PT End of Session - 09/15/19 1228    Visit Number  13    Date for PT Re-Evaluation  09/26/19    Authorization Type  Medicare KX now secondary to previous PT this year;  10th visit progress note    PT Start Time  0802    PT Stop Time  0845    PT Time Calculation (min)  43 min    Activity Tolerance  Patient tolerated treatment well       Past Medical History:  Diagnosis Date  . Anxiety   . AV block, 1st degree   . Basal cell carcinoma   . Cardiac murmur   . Cataract   . Elevated PSA measurement 2018  . Hyperlipidemia   . Hypertension 2015  . 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 IFG (impaired fasting glucose) 01/2016  . Pre-diabetes   . RBBB (right bundle branch block)   . Rheumatic fever    age 47 or 46, in hospital for a month, out of school for a yr  . Seasonal allergies   . Squamous cell carcinoma     Past Surgical History:  Procedure Laterality Date  . NASAL SEPTUM SURGERY    . NOSE SURGERY    . SHOULDER ARTHROSCOPY WITH SUBACROMIAL DECOMPRESSION Right 08/07/2019   Procedure: SHOULDER ARTHROSCOPY WITH SUBACROMIAL DECOMPRESSION;  Surgeon: Tania Ade, MD;  Location: [place];  Service: Orthopedics;  Laterality: Right;  . SKIN CANCER EXCISION     basal and squamous cell   . TONSILLECTOMY    . VASECTOMY    . WISDOM TOOTH EXTRACTION      There were no vitals filed for this visit.  Subjective Assessment - 09/15/19 0803    Subjective  Shoulder has been pretty good this week.  My hip needs some ex but I want to focus on the shoulder.    Currently in Pain?  Yes    Pain Score  1     Pain Location  Shoulder    Pain Orientation  Right    Pain Type  Surgical pain    Pain Score  1    Pain Location  Hip    Pain Orientation  Left         [facility] PT Assessment - 09/15/19 0001      AROM   Right Shoulder Flexion  101 Degrees   pain; 105 degrees standing   Right Shoulder ABduction  108 Degrees    Right Shoulder External Rotation  40 Degrees                   [facility] Adult PT Treatment/Exercise - 09/15/19 0001      Knee/Hip Exercises: Stretches   Hip Flexor Stretch  Right;Left;5 reps    Hip Flexor Stretch Limitations  on 2nd step with left arm elevation and side bend     Piriformis Stretch Limitations  supine with left crossed over right on green ball 5x     Other Knee/Hip Stretches  supine lumbar rotation with LEs on green ball 10x     Other Knee/Hip Stretches  LEs on green ball hip flexion 5x, frog leg 5x  Shoulder Exercises: Supine   Protraction Limitations  both arms holding 2# weight 10x;  needs verbal cues to keep at forehead level, pain with eye level or below     Other Supine Exercises  from 110 degrees assisted position 12:00/6:00 and 9/3:00  small arcs 10x     Other Supine Exercises  110 degrees to 170 degrees flexion 2x 10   tactile cue to block from going below 100 degrees     Shoulder Exercises: Sidelying   Flexion  AAROM;Right;20 reps    Flexion Limitations  with UE Ranger; next time put rubber mat on floor to keep from sliding     Other Sidelying Exercises  12/6:00 and 9/3:00 at 90 degrees abduction 10x each       Shoulder Exercises: Standing   Extension  Strengthening;Right;20 reps;Weights    Extension Weight (lbs)  25    Row  Strengthening;Right;20 reps;Weights    Row Weight (lbs)  25      Shoulder Exercises: ROM/Strengthening   Other ROM/Strengthening Exercises  wall ladder to L30 5x       Vasopneumatic   Number Minutes Vasopneumatic   10 minutes    Vasopnuematic Location   Shoulder   right, seated in chair    Vasopneumatic Pressure  Low     Vasopneumatic Temperature   3 snow flakes               PT Short Term Goals - 09/05/19 1802      PT SHORT TERM GOAL #1   Title  independent with initial HEP    Status  Achieved      PT SHORT TERM GOAL #2   Title  The patient will report a 30% improvement in shoulder pain with grooming/dressing tasks    Status  Achieved      PT SHORT TERM GOAL #3   Title  Right shoulder active or active assisted elevation to 80  degrees needed for home and work ADLs in sitting    Status  Achieved        PT Long Term Goals - 08/18/19 1221      PT LONG TERM GOAL #1   Title  The patient will be independent in safe self progression of HEP  for further improvements in ROM and strength    Time  6    Period  Weeks    Status  On-going    Target Date  09/26/19      PT LONG TERM GOAL #2   Title  The patient will have improved right shoulder elevation to 110 degrees needed for reaching eye level shelves and personal care    Time  6    Period  Weeks    Status  On-going      PT LONG TERM GOAL #3   Title  The patient will have improved shoulder pain with  usual ADLs by at least 80%    Time  6    Period  Weeks    Status  On-going      PT LONG TERM GOAL #4   Title  right shoulder abduction >/= 100 degrees to reach out for an item    Time  8    Period  Weeks    Status  On-going      PT LONG TERM GOAL #5   Title  The patient will have grossly 3+/5 to 4-/5 strength in deltoids and scapula stabilizers needed for light lifting needed at home and work  Time  8    Period  Weeks    Status  On-going      Additional Long Term Goals   Additional Long Term Goals  Yes      PT LONG TERM GOAL #6   Title  The patient will report a 60% reduction in left hip pain with rising sit to stand, walking > 2 blocks    Time  6    Period  Weeks    Status  New    Target Date  09/26/19      PT LONG TERM GOAL #7   Title  The patient will have grossly 4/5 to 4+/5 left hip strength and single leg stand > 6  sec    Time  6    Period  Weeks    Status  New    Target Date  09/26/19            Plan - 09/15/19 1229    Clinical Impression Statement  The patient is progressing with shoulder ROM in standing and sitting > 100 elevation with both flexion and abduction.  Due to recent exacerbation of pain, we have decreased treatment frequency from 3x/week to 2x/week, encouraged not "overdoing it" at work and home and focused on avoiding more painful motions.   Painful with arm lowering from elevated positions and painful with elevating 0-95 degrees.  Hip pain is much improved with reports of AM stiffness resolved with stretching.  Therapist closely monitoring response with all treatment interventions.    Rehab Potential  Good    PT Frequency  3x / week    PT Duration  6 weeks    PT Treatment/Interventions  ADLs/Self Care Home Management;Electrical Stimulation;Cryotherapy;Moist Heat;Therapeutic activities;Therapeutic exercise;Neuromuscular re-education;Manual techniques;Patient/family education;Taping;Vasopneumatic Device;Dry needling;Scar mobilization;Iontophoresis 4mg /ml Dexamethasone    PT Next Visit Plan  KX;ROM and strengthening (periscap, deltoid, biceps, triceps) as tolerated  for rotator cuff deficient right shoulder;   left hip stretching and DN if needed;   vasocompression right shoulder    PT Home Exercise Plan  Access Code: [email or web address]       Patient will benefit from skilled therapeutic intervention in order to improve the following deficits and impairments:  Decreased range of motion, Impaired UE functional use, Decreased activity tolerance, Impaired perceived functional ability, Pain, Decreased strength  Visit Diagnosis: Stiffness of right shoulder, not elsewhere classified  Muscle weakness (generalized)  Acute pain of right shoulder  Pain in left hip     Problem List Patient Active Problem List   Diagnosis Date Noted  . Piriformis syndrome of left side 08/18/2019  . Rotator  cuff tear, right 05/29/2019  . Closed fracture of distal clavicle 05/15/2019  . 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   [name], PT 09/15/19 12:37 PM Phone: [phone] Fax: [phone] [name] 09/15/2019, 12:37 PM  [facility] [address], [place], [place], [address] Phone: [phone]   Fax:  [phone]  Name: Cory Barnett MRN: [id number] Date of Birth: [birth year]

## 2019-09-18 ENCOUNTER — Ambulatory Visit: Payer: Medicare Other

## 2019-09-20 ENCOUNTER — Ambulatory Visit: Payer: Medicare Other

## 2019-09-20 ENCOUNTER — Other Ambulatory Visit: Payer: Self-pay

## 2019-09-20 DIAGNOSIS — Z9889 Other specified postprocedural states: Secondary | ICD-10-CM | POA: Diagnosis not present

## 2019-09-20 DIAGNOSIS — R600 Localized edema: Secondary | ICD-10-CM | POA: Diagnosis not present

## 2019-09-20 DIAGNOSIS — M25511 Pain in right shoulder: Secondary | ICD-10-CM | POA: Diagnosis not present

## 2019-09-20 DIAGNOSIS — M6281 Muscle weakness (generalized): Secondary | ICD-10-CM | POA: Diagnosis not present

## 2019-09-20 DIAGNOSIS — M25611 Stiffness of right shoulder, not elsewhere classified: Secondary | ICD-10-CM | POA: Diagnosis not present

## 2019-09-20 DIAGNOSIS — M25552 Pain in left hip: Secondary | ICD-10-CM

## 2019-09-20 NOTE — Therapy (Signed)
[facility] [address], [place], [place], [address] Phone: [phone]   Fax:  [phone]  Physical Therapy Treatment  Patient Details  Name: Cory Barnett MRN: [id number] Date of Birth: 12-09-1945 Referring Provider (PT): Dr. Tania Ade   Encounter Date: 09/20/2019  PT End of Session - 09/20/19 0846    Visit Number  14    Date for PT Re-Evaluation  09/26/19    Authorization Type  Medicare KX now secondary to previous PT this year;  20th visit progress note    PT Start Time  0803    PT Stop Time  0848    PT Time Calculation (min)  45 min    Activity Tolerance  Patient tolerated treatment well    Behavior During Therapy  [facility] for tasks assessed/performed       Past Medical History:  Diagnosis Date  . Anxiety   . AV block, 1st degree   . Basal cell carcinoma   . Cardiac murmur   . Cataract   . Elevated PSA measurement 2018  . Hyperlipidemia   . Hypertension 2015  . 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 IFG (impaired fasting glucose) 01/2016  . Pre-diabetes   . RBBB (right bundle branch block)   . Rheumatic fever    age 73 or 2, in hospital for a month, out of school for a yr  . Seasonal allergies   . Squamous cell carcinoma     Past Surgical History:  Procedure Laterality Date  . NASAL SEPTUM SURGERY    . NOSE SURGERY    . SHOULDER ARTHROSCOPY WITH SUBACROMIAL DECOMPRESSION Right 08/07/2019   Procedure: SHOULDER ARTHROSCOPY WITH SUBACROMIAL DECOMPRESSION;  Surgeon: Tania Ade, MD;  Location: [place];  Service: Orthopedics;  Laterality: Right;  . SKIN CANCER EXCISION     basal and squamous cell   . TONSILLECTOMY    . VASECTOMY    . WISDOM TOOTH EXTRACTION      There were no vitals filed for this visit.  Subjective Assessment - 09/20/19 0811    Subjective  I see the MD today for follow-up.  My hip is doing well.  I'm doing my exercises    Currently in Pain?  Yes    Pain  Score  2     Pain Location  Shoulder    Pain Orientation  Right    Pain Descriptors / Indicators  Aching;Sharp    Pain Type  Surgical pain    Pain Onset  More than a month ago    Pain Frequency  Intermittent    Aggravating Factors   use, reaching out or up    Pain Relieving Factors  rest, ice         [facility] PT Assessment - 09/20/19 0001      Assessment   Medical Diagnosis  s/p right shoulder arthroscopy    Referring Provider (PT)  Dr. Tania Ade      Prior Function   Level of Independence  Independent      AROM   Right Shoulder Flexion  100 Degrees    Right Shoulder ABduction  75 Degrees    Right Shoulder External Rotation  40 Degrees      Strength   Right Shoulder Flexion  3+/5    Right Shoulder ABduction  3-/5    Right Shoulder Internal Rotation  4+/5    Right Shoulder External Rotation  3+/5  [facility] Adult PT Treatment/Exercise - 09/20/19 0001      Shoulder Exercises: Supine   Protraction Limitations  both arms holding 2# weight 10x; then Rt UE only 2# x 10    Other Supine Exercises  from 110 degrees assisted position 12:00/6:00 and 9/3:00  small arcs 10x     Other Supine Exercises  110 degrees to 170 degrees flexion 2x 10   tactile cue to block from going below 100 degrees     Shoulder Exercises: Sidelying   Flexion  AAROM;Right;20 reps    Other Sidelying Exercises  12/6:00 and 9/3:00 at 90 degrees abduction 10x each       Shoulder Exercises: Standing   Extension  Strengthening;Right;20 reps;Weights    Extension Weight (lbs)  25    Row  Strengthening;Right;20 reps;Weights    Row Weight (lbs)  25      Shoulder Exercises: ROM/Strengthening   UBE (Upper Arm Bike)  Level 1.5 x 6 minutes (3/3)   PT present for assessment   Other ROM/Strengthening Exercises  wall ladder to L30 20x      Vasopneumatic   Number Minutes Vasopneumatic   10 minutes    Vasopnuematic Location   Shoulder   right, seated in chair    Vasopneumatic  Pressure  Low    Vasopneumatic Temperature   3 snow flakes               PT Short Term Goals - 09/05/19 1802      PT SHORT TERM GOAL #1   Title  independent with initial HEP    Status  Achieved      PT SHORT TERM GOAL #2   Title  The patient will report a 30% improvement in shoulder pain with grooming/dressing tasks    Status  Achieved      PT SHORT TERM GOAL #3   Title  Right shoulder active or active assisted elevation to 80  degrees needed for home and work ADLs in sitting    Status  Achieved        PT Long Term Goals - 09/20/19 [id number]      PT LONG TERM GOAL #1   Title  The patient will be independent in safe self progression of HEP  for further improvements in ROM and strength    Time  6    Period  Weeks    Status  On-going      PT LONG TERM GOAL #2   Title  The patient will have improved right shoulder elevation to 110 degrees needed for reaching eye level shelves and personal care    Time  6    Period  Weeks    Status  On-going      PT LONG TERM GOAL #3   Title  The patient will have improved shoulder pain with  usual ADLs by at least 80%    Baseline  using 60% of normal.  Pain and challenge with overhead use    Time  6    Period  Weeks    Status  On-going      PT LONG TERM GOAL #4   Title  right shoulder abduction >/= 100 degrees to reach out for an item    Time  8    Period  Weeks    Status  On-going      PT LONG TERM GOAL #5   Title  The patient will have grossly 3+/5 to 4-/5 strength in deltoids and scapula stabilizers  needed for light lifting needed at home and work    Time  8    Period  Weeks    Status  On-going      PT LONG TERM GOAL #6   Title  The patient will report a 60% reduction in left hip pain with rising sit to stand, walking > 2 blocks    Status  Achieved            Plan - 09/20/19 0841    Clinical Impression Statement  Pt is making steady progress s/p Rt shoulder scope.  Pt reports 60% use of Rt UE and is most limited  with reaching out and overhead.  Rt shoulder flexion is 100 and abduction 75 degrees with scapular elevation due to non-intact rotator cuff musculature.  Pt requires tactile cues for scapular depression with movement against gravity.   Pt describes a painful arc of Rt shoulder from 95-120 degrees of flexion.  Pt is challenged with flexion and horizontal abduction arcs performed in sidelying.  Pt will continue to benefit from skilled PT to address Rt shoulder strength, A/ROM and functional use to maximize benefit due to loss of rotator cuff musculature.    Rehab Potential  Good    PT Frequency  3x / week    PT Duration  6 weeks    PT Treatment/Interventions  ADLs/Self Care Home Management;Electrical Stimulation;Cryotherapy;Moist Heat;Therapeutic activities;Therapeutic exercise;Neuromuscular re-education;Manual techniques;Patient/family education;Taping;Vasopneumatic Device;Dry needling;Scar mobilization;Iontophoresis 4mg /ml Dexamethasone    PT Next Visit Plan  KX, strength of Rt shoulder and functional use, Lt hip exercises as needed.  Test Lt hip strength and SLS to address goal.    PT Home Exercise Plan  Access Code: [email or web address]       Patient will benefit from skilled therapeutic intervention in order to improve the following deficits and impairments:  Decreased range of motion, Impaired UE functional use, Decreased activity tolerance, Impaired perceived functional ability, Pain, Decreased strength  Visit Diagnosis: Stiffness of right shoulder, not elsewhere classified  Muscle weakness (generalized)  Acute pain of right shoulder  Pain in left hip     Problem List Patient Active Problem List   Diagnosis Date Noted  . Piriformis syndrome of left side 08/18/2019  . Rotator cuff tear, right 05/29/2019  . Closed fracture of distal clavicle 05/15/2019  . 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     Sigurd Sos, PT 09/20/19 8:47 AM  [facility] [address] [place], [place], [address] Phone: [phone]   Fax:  [phone]  Name: KALYB SHULTS MRN: [id number] Date of Birth: 09/07/1946

## 2019-09-21 ENCOUNTER — Other Ambulatory Visit: Payer: Self-pay

## 2019-09-21 ENCOUNTER — Ambulatory Visit: Payer: Medicare Other | Admitting: Physical Therapy

## 2019-09-21 DIAGNOSIS — M25511 Pain in right shoulder: Secondary | ICD-10-CM

## 2019-09-21 DIAGNOSIS — M25611 Stiffness of right shoulder, not elsewhere classified: Secondary | ICD-10-CM | POA: Diagnosis not present

## 2019-09-21 DIAGNOSIS — M25552 Pain in left hip: Secondary | ICD-10-CM | POA: Diagnosis not present

## 2019-09-21 DIAGNOSIS — M6281 Muscle weakness (generalized): Secondary | ICD-10-CM | POA: Diagnosis not present

## 2019-09-21 DIAGNOSIS — R600 Localized edema: Secondary | ICD-10-CM | POA: Diagnosis not present

## 2019-09-21 NOTE — Therapy (Signed)
[facility] [address], [place], [place], [address] Phone: [phone]   Fax:  [phone]  Physical Therapy Treatment  Patient Details  Name: Cory Barnett MRN: [id number] Date of Birth: 1946/07/05 Referring Provider (PT): Dr. Tania Ade   Encounter Date: 09/21/2019  PT End of Session - 09/21/19 0932    Visit Number  15    Date for PT Re-Evaluation  09/26/19    Authorization Type  Medicare KX now secondary to previous PT this year;  20th visit progress note    PT Start Time  0805    PT Stop Time  0850    PT Time Calculation (min)  45 min    Activity Tolerance  Patient tolerated treatment well       Past Medical History:  Diagnosis Date  . Anxiety   . AV block, 1st degree   . Basal cell carcinoma   . Cardiac murmur   . Cataract   . Elevated PSA measurement 2018  . Hyperlipidemia   . Hypertension 2015  . 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 IFG (impaired fasting glucose) 01/2016  . Pre-diabetes   . RBBB (right bundle branch block)   . Rheumatic fever    age 89 or 56, in hospital for a month, out of school for a yr  . Seasonal allergies   . Squamous cell carcinoma     Past Surgical History:  Procedure Laterality Date  . NASAL SEPTUM SURGERY    . NOSE SURGERY    . SHOULDER ARTHROSCOPY WITH SUBACROMIAL DECOMPRESSION Right 08/07/2019   Procedure: SHOULDER ARTHROSCOPY WITH SUBACROMIAL DECOMPRESSION;  Surgeon: Tania Ade, MD;  Location: [place];  Service: Orthopedics;  Laterality: Right;  . SKIN CANCER EXCISION     basal and squamous cell   . TONSILLECTOMY    . VASECTOMY    . WISDOM TOOTH EXTRACTION      There were no vitals filed for this visit.  Subjective Assessment - 09/21/19 0806    Subjective  The doctor thinks I'm progressing and I may not need to have more surgery.  The hip is getting better and I took a long walk yesterday and my hip needs some work.    Currently in  Pain?  Yes    Pain Score  2     Pain Location  Shoulder    Pain Orientation  Right    Pain Score  2    Pain Location  Hip    Pain Orientation  Left                       [facility] Adult PT Treatment/Exercise - 09/21/19 0001      Shoulder Exercises: Prone   Retraction Limitations  row 20x     Extension  AROM;Right;20 reps    Horizontal ABduction 1  AROM;Right;15 reps    Horizontal ABduction 1 Limitations  small range    Other Prone Exercises  pendulums 2 min     Other Prone Exercises  small range flexion 15x       Shoulder Exercises: ROM/Strengthening   UBE (Upper Arm Bike)  L1 2 forward/2 backward     Other ROM/Strengthening Exercises  wall ladder to L30 20x   with lift aways at the top     Vasopneumatic   Number Minutes Vasopneumatic   10 minutes    Vasopnuematic Location   Shoulder   right, seated in chair  Vasopneumatic Pressure  Low    Vasopneumatic Temperature   3 snow flakes      Manual Therapy   Joint Mobilization  hip long axis distraction, inferior hip mobs, AP in internal rotation grade 3 3x 20 sec     Muscle Energy Technique  piriformis contract relax 3x 5 sec holds                PT Short Term Goals - 09/05/19 1802      PT SHORT TERM GOAL #1   Title  independent with initial HEP    Status  Achieved      PT SHORT TERM GOAL #2   Title  The patient will report a 30% improvement in shoulder pain with grooming/dressing tasks    Status  Achieved      PT SHORT TERM GOAL #3   Title  Right shoulder active or active assisted elevation to 80  degrees needed for home and work ADLs in sitting    Status  Achieved        PT Long Term Goals - 09/20/19 [id number]      PT LONG TERM GOAL #1   Title  The patient will be independent in safe self progression of HEP  for further improvements in ROM and strength    Time  6    Period  Weeks    Status  On-going      PT LONG TERM GOAL #2   Title  The patient will have improved right shoulder elevation  to 110 degrees needed for reaching eye level shelves and personal care    Time  6    Period  Weeks    Status  On-going      PT LONG TERM GOAL #3   Title  The patient will have improved shoulder pain with  usual ADLs by at least 80%    Baseline  using 60% of normal.  Pain and challenge with overhead use    Time  6    Period  Weeks    Status  On-going      PT LONG TERM GOAL #4   Title  right shoulder abduction >/= 100 degrees to reach out for an item    Time  8    Period  Weeks    Status  On-going      PT LONG TERM GOAL #5   Title  The patient will have grossly 3+/5 to 4-/5 strength in deltoids and scapula stabilizers needed for light lifting needed at home and work    Time  8    Period  Weeks    Status  On-going      PT LONG TERM GOAL #6   Title  The patient will report a 60% reduction in left hip pain with rising sit to stand, walking > 2 blocks    Status  Achieved            Plan - 09/21/19 1922    Clinical Impression Statement  The patient has decreased shoulder pain intensity this week after flare up last week.  He denies pain with prone shoulder ROM and is able to perform standing finger ladder climbs with lift off at the top  with minimal discomfort.  Good response to left hip mobilizations.  Therapist closely monitoring response with all treatment interventions.  No modifications needed for pain.    Examination-Activity Limitations  Reach Overhead;Self Feeding;Dressing;Lift;Carry;Locomotion Level    Rehab Potential  Good  PT Frequency  3x / week    PT Duration  6 weeks    PT Treatment/Interventions  ADLs/Self Care Home Management;Electrical Stimulation;Cryotherapy;Moist Heat;Therapeutic activities;Therapeutic exercise;Neuromuscular re-education;Manual techniques;Patient/family education;Taping;Vasopneumatic Device;Dry needling;Scar mobilization;Iontophoresis 4mg /ml Dexamethasone    PT Next Visit Plan  KX, strength of Rt shoulder and functional use, Lt hip exercises  as needed.  Test Lt hip strength and SLS to address goal.    PT Home Exercise Plan  Access Code: [email or web address]       Patient will benefit from skilled therapeutic intervention in order to improve the following deficits and impairments:  Decreased range of motion, Impaired UE functional use, Decreased activity tolerance, Impaired perceived functional ability, Pain, Decreased strength  Visit Diagnosis: Stiffness of right shoulder, not elsewhere classified  Muscle weakness (generalized)  Acute pain of right shoulder  Pain in left hip     Problem List Patient Active Problem List   Diagnosis Date Noted  . Piriformis syndrome of left side 08/18/2019  . Rotator cuff tear, right 05/29/2019  . Closed fracture of distal clavicle 05/15/2019  . 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   [name], PT 09/21/19 7:58 PM Phone: [phone] Fax: [phone] [name] 09/21/2019, 7:57 PM  [facility] [address], [place], [place], [address] Phone: [phone]   Fax:  [phone]  Name: JUSIN QUESINBERRY MRN: [id number] Date of Birth: 05/03/1946

## 2019-09-25 ENCOUNTER — Ambulatory Visit: Payer: Medicare Other

## 2019-09-26 ENCOUNTER — Encounter: Payer: Self-pay | Admitting: Physical Therapy

## 2019-09-26 ENCOUNTER — Other Ambulatory Visit: Payer: Self-pay

## 2019-09-26 ENCOUNTER — Ambulatory Visit: Payer: Medicare Other | Admitting: Physical Therapy

## 2019-09-26 DIAGNOSIS — M25611 Stiffness of right shoulder, not elsewhere classified: Secondary | ICD-10-CM

## 2019-09-26 DIAGNOSIS — R600 Localized edema: Secondary | ICD-10-CM | POA: Diagnosis not present

## 2019-09-26 DIAGNOSIS — M25511 Pain in right shoulder: Secondary | ICD-10-CM

## 2019-09-26 DIAGNOSIS — M25552 Pain in left hip: Secondary | ICD-10-CM | POA: Diagnosis not present

## 2019-09-26 DIAGNOSIS — M6281 Muscle weakness (generalized): Secondary | ICD-10-CM

## 2019-09-26 NOTE — Therapy (Signed)
[facility] [address] [place], [place], [address] Phone: [phone]   Fax:  [phone]  Physical Therapy Treatment/Recertification   Patient Details  Name: Cory Barnett MRN: [id number] Date of Birth: 02-Mar-1946 Referring Provider (PT): Dr. Tania Ade   Encounter Date: 09/26/2019  PT End of Session - 09/26/19 1609    Visit Number  16    Date for PT Re-Evaluation  11/07/19    Authorization Type  Medicare KX now secondary to previous PT this year;  20th visit progress note    PT Start Time  1527    PT Stop Time  1615    PT Time Calculation (min)  48 min    Activity Tolerance  Patient tolerated treatment well       Past Medical History:  Diagnosis Date  . Anxiety   . AV block, 1st degree   . Basal cell carcinoma   . Cardiac murmur   . Cataract   . Elevated PSA measurement 2018  . Hyperlipidemia   . Hypertension 2015  . 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 IFG (impaired fasting glucose) 01/2016  . Pre-diabetes   . RBBB (right bundle branch block)   . Rheumatic fever    age 73 or 73, in hospital for a month, out of school for a yr  . Seasonal allergies   . Squamous cell carcinoma     Past Surgical History:  Procedure Laterality Date  . NASAL SEPTUM SURGERY    . NOSE SURGERY    . SHOULDER ARTHROSCOPY WITH SUBACROMIAL DECOMPRESSION Right 08/07/2019   Procedure: SHOULDER ARTHROSCOPY WITH SUBACROMIAL DECOMPRESSION;  Surgeon: Tania Ade, MD;  Location: [place];  Service: Orthopedics;  Laterality: Right;  . SKIN CANCER EXCISION     basal and squamous cell   . TONSILLECTOMY    . VASECTOMY    . WISDOM TOOTH EXTRACTION      There were no vitals filed for this visit.  Subjective Assessment - 09/26/19 1531    Subjective  Low pain level although I had a sharp pain for no reason.  The doctor wants me to lift a soup can but that hurts.  It feels good once I get past that point and go  overhead.  [place] problem.    Currently in Pain?  Yes    Pain Score  1     Pain Location  Shoulder    Pain Orientation  Right         [facility] PT Assessment - 09/26/19 0001      Assessment   Medical Diagnosis  s/p right shoulder arthroscopy    Referring Provider (PT)  Dr. Tania Ade      Observation/Other Assessments   Focus on Therapeutic Outcomes (FOTO)   33% limitation       Observation/Other Assessments-Edema    Edema  --   mild right shoulder edema variable      AROM   Right Shoulder Flexion  110 Degrees    Right Shoulder ABduction  95 Degrees      Strength   Overall Strength Comments  SLS left 14 sec     Left Hip Flexion  5/5    Left Hip Extension  5/5    Left Hip ABduction  4-/5                   [facility] Adult PT Treatment/Exercise - 09/26/19  0001      Knee/Hip Exercises: Stretches   Hip Flexor Stretch  Right;Left;5 reps    Hip Flexor Stretch Limitations  on 2nd step with left arm elevation and side bend       Knee/Hip Exercises: Aerobic   Nustep  L1 6 min while discussing progress       Shoulder Exercises: Supine   Protraction  AROM;Right;10 reps    Protraction Limitations  at 110 degrees     Other Supine Exercises  from 110 degrees assisted position 12:00/6:00 and 9/3:00  small arcs 10x     Other Supine Exercises  110 degrees to 170 degrees flexion 2x 10   tactile cue to block from going below 100 degrees     Shoulder Exercises: Seated   Other Seated Exercises  assisted place and holds 60, 90 , 120, 140 5 sec holds 3 x       Shoulder Exercises: Prone   Extension  AROM;Right;20 reps    Horizontal ABduction 1  AROM;Right;15 reps    Horizontal ABduction 1 Limitations  small range    Other Prone Exercises  pendulums 2 min     Other Prone Exercises  small range flexion 15x       Shoulder Exercises: ROM/Strengthening   Nustep  L1 6 min     Other ROM/Strengthening Exercises  wall ladder to L30 20x       Vasopneumatic   Number Minutes Vasopneumatic   10 minutes    Vasopnuematic Location   Shoulder   right, seated in chair    Vasopneumatic Pressure  Low    Vasopneumatic Temperature   3 snow flakes               PT Short Term Goals - 09/26/19 1701      PT SHORT TERM GOAL #1   Title  independent with initial HEP    Status  Achieved      PT SHORT TERM GOAL #2   Title  The patient will report a 30% improvement in shoulder pain with grooming/dressing tasks    Status  Achieved      PT SHORT TERM GOAL #3   Title  Right shoulder active or active assisted elevation to 80  degrees needed for home and work ADLs in sitting    Status  Achieved        PT Long Term Goals - 09/26/19 1539      PT LONG TERM GOAL #1   Title  The patient will be independent in safe self progression of HEP  for further improvements in ROM and strength    Period  Weeks    Status  On-going    Target Date  11/07/19      PT LONG TERM GOAL #2   Title  The patient will have improved right shoulder elevation to 120 degrees needed for reaching eye level shelves and personal care    Time  6    Period  Weeks    Status  Revised      PT LONG TERM GOAL #3   Title  The patient will have improved shoulder pain with  usual ADLs by at least 80%    Period  Weeks    Status  On-going      PT LONG TERM GOAL #4   Title  right shoulder abduction >/= 100 degrees to reach out for an item    Time  6    Period  Weeks  Status  On-going      PT LONG TERM GOAL #5   Title  The patient will have grossly 3+/5 to 4-/5 strength in deltoids and scapula stabilizers needed for light lifting needed at home and work    Time  8    Period  Weeks    Status  On-going      PT LONG TERM GOAL #6   Title  The patient will report a 60% reduction in left hip pain with rising sit to stand, walking > 2 blocks    Status  Achieved      PT LONG TERM GOAL #7   Title  The patient will have grossly 4/5 to 4+/5 left hip strength and  single leg stand > 6 sec    Time  6    Period  Weeks    Status  On-going            Plan - 09/26/19 1610    Clinical Impression Statement  Right shoulder elevation improved to 110 degrees in standing.  He has full passive ROM however painful at 90-100 degree ROM actively and with lowering his arm from an elevated position, consistent with irreparable rotator cuff tears.     His pain level has been elevated at times with overuse at work but has been much more controlled in the last 2 weeks.  His hip pain is much better as well and he is able to walk with his wife medium distances.   Improved strength and single leg stance time.  Pain and shoulder edema much improved with vasocompression.  Will decrease frequency to 2x/week.    Comorbidities  osteoarthritis knees, shoulder;  HTN    Examination-Activity Limitations  Reach Overhead;Self Feeding;Dressing;Lift;Carry;Locomotion Level    Examination-Participation Restrictions  Community Activity;Other    Stability/Clinical Decision Making  Stable/Uncomplicated    Rehab Potential  Good    PT Frequency  2x / week    PT Duration  6 weeks    PT Treatment/Interventions  ADLs/Self Care Home Management;Electrical Stimulation;Cryotherapy;Moist Heat;Therapeutic activities;Therapeutic exercise;Neuromuscular re-education;Manual techniques;Patient/family education;Taping;Vasopneumatic Device;Dry needling;Scar mobilization;Iontophoresis 4mg /ml Dexamethasone    PT Next Visit Plan  KX, strength of Rt shoulder and functional use, seated place and holds;   Lt hip exercises as needed.    PT Home Exercise Plan  Access Code: [email or web address]       Patient will benefit from skilled therapeutic intervention in order to improve the following deficits and impairments:  Decreased range of motion, Impaired UE functional use, Decreased activity tolerance, Impaired perceived functional ability, Pain, Decreased strength  Visit Diagnosis: Stiffness of right shoulder, not elsewhere  classified - Plan: PT plan of care cert/re-cert  Muscle weakness (generalized) - Plan: PT plan of care cert/re-cert  Acute pain of right shoulder - Plan: PT plan of care cert/re-cert  Pain in left hip - Plan: PT plan of care cert/re-cert  Localized edema - Plan: PT plan of care cert/re-cert     Problem List Patient Active Problem List   Diagnosis Date Noted  . Piriformis syndrome of left side 08/18/2019  . Rotator cuff tear, right 05/29/2019  . Closed fracture of distal clavicle 05/15/2019  . 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   [name], PT 09/26/19 5:05 PM Phone: [phone] Fax: [phone] [name] 09/26/2019, 5:04 PM  [facility] [address] [place], [place], [address] Phone: [phone]   Fax:  [phone]  Name: JAYSEON NEARHOOD MRN: [id number] Date of Birth: 08-Dec-1945

## 2019-10-02 ENCOUNTER — Ambulatory Visit: Payer: Medicare Other

## 2019-10-04 ENCOUNTER — Encounter: Payer: Self-pay | Admitting: Family Medicine

## 2019-10-04 ENCOUNTER — Other Ambulatory Visit: Payer: Self-pay

## 2019-10-04 ENCOUNTER — Ambulatory Visit (INDEPENDENT_AMBULATORY_CARE_PROVIDER_SITE_OTHER): Payer: Medicare Other | Admitting: Family Medicine

## 2019-10-04 ENCOUNTER — Ambulatory Visit: Payer: Medicare Other | Attending: Orthopedic Surgery

## 2019-10-04 DIAGNOSIS — M25611 Stiffness of right shoulder, not elsewhere classified: Secondary | ICD-10-CM | POA: Diagnosis present

## 2019-10-04 DIAGNOSIS — M25511 Pain in right shoulder: Secondary | ICD-10-CM | POA: Insufficient documentation

## 2019-10-04 DIAGNOSIS — M25552 Pain in left hip: Secondary | ICD-10-CM | POA: Insufficient documentation

## 2019-10-04 DIAGNOSIS — M75101 Unspecified rotator cuff tear or rupture of right shoulder, not specified as traumatic: Secondary | ICD-10-CM

## 2019-10-04 DIAGNOSIS — M6281 Muscle weakness (generalized): Secondary | ICD-10-CM | POA: Diagnosis present

## 2019-10-04 DIAGNOSIS — G5702 Lesion of sciatic nerve, left lower limb: Secondary | ICD-10-CM | POA: Diagnosis not present

## 2019-10-04 DIAGNOSIS — R600 Localized edema: Secondary | ICD-10-CM | POA: Diagnosis present

## 2019-10-04 NOTE — Assessment & Plan Note (Addendum)
Large rotator cuff tear and patient is considering the possibility of a reverse shoulder replacement.  Discussed with patient about the possibility.  Discussed possible PRP.  Discussed icing regimen and home exercises, discussed avoiding certain activities.  Follow-up again 4 to 8 weeks spent  25 minutes with patient face-to-face and had greater than 50% of counseling including as described above in assessment and plan.

## 2019-10-04 NOTE — Progress Notes (Signed)
[name] [place], [place] [address] Phone: [phone] Subjective:   Cory Barnett, am serving as a scribe for Dr. Hulan [name]. This visit occurred during the SARS-CoV-2 public health emergency.  Safety protocols were in place, including screening questions prior to the visit, additional usage of staff PPE, and extensive cleaning of exam room while observing appropriate contact time as indicated for disinfecting solutions.     CC: Left tooth, right shoulder pain follow-up  [id number]   08/18/2019 Left piriformis which activities of doing which 1 is doing returns to avoid.  Discussed regimen.Discussed tenderness: Minimal in size.  Patient given home exercises, worsening symptoms consider formal physical therapy also consider injection.  Update 10/04/2019 Cory Barnett is a 73 y.o. male coming in with complaint of left hip pain.  Found to have piriformis syndrome and has been working with physical therapy patient states that his hip is doing much better.   Has been doing physical therapy for his shoulder. His ER range of motion is limited. Had surgery but states that surgeon was unable to repair 2 tendons.  Patient had 5 cm retraction of the rotator cuff.  Continues to have some difficulty overall.  Patient has not made any significant improvement since doing physical therapy for quite a long time.  Unable to have the rotator cuff repair done.    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   IFG (impaired fasting glucose) 01/2016   Pre-diabetes    RBBB (right bundle branch block)    Rheumatic fever    age 22 or 52, in hospital for a month, out of school for a yr   Seasonal allergies    Squamous cell carcinoma    Past Surgical  History:  Procedure Laterality Date   NASAL SEPTUM SURGERY     NOSE SURGERY     SHOULDER ARTHROSCOPY WITH SUBACROMIAL DECOMPRESSION Right 08/07/2019   Procedure: SHOULDER ARTHROSCOPY WITH SUBACROMIAL DECOMPRESSION;  Surgeon: Tania Ade, MD;  Location: [place];  Service: Orthopedics;  Laterality: Right;   SKIN CANCER EXCISION     basal and squamous cell    TONSILLECTOMY     VASECTOMY     WISDOM TOOTH EXTRACTION     Social History   Socioeconomic History   Marital status: Married    Spouse name: Not on file   Number of children: 3   Years of education: Not on file   Highest education level: Not on file  Occupational History    Comment: Retired  Scientist, product/process development strain: Not on file   Food insecurity    Worry: Not on file    Inability: Not on Lexicographer needs    Medical: Not on file    Non-medical: Not on file  Tobacco Use   Smoking status: Former Smoker   Smokeless tobacco: Never Used  Substance and Sexual Activity   Alcohol use: Yes    Alcohol/week: 2.0 standard drinks    Types: 2 Cans of beer per week    Comment: social   Drug use: Never   Sexual activity: Not on file  Lifestyle   Physical activity  Days per week: Not on file    Minutes per session: Not on file   Stress: Not on file  Relationships   Social connections    Talks on phone: Not on file    Gets together: Not on file    Attends religious service: Not on file    Active member of club or organization: Not on file    Attends meetings of clubs or organizations: Not on file    Relationship status: Not on file  Other Topics Concern   Not on file  Social History Narrative   Not on file   Allergies  Allergen Reactions   Bee Venom Anaphylaxis   Moxifloxacin Shortness Of Breath   Family History  Problem Relation Age of Onset   Stroke Mother    Arthritis Mother    Cancer Mother        unknown    Dementia Mother     Arthritis Father    Mesothelioma Father    Arthritis Sister    Arthritis Sister    Bipolar disorder Sister      Current Outpatient Medications (Cardiovascular):    losartan (COZAAR) 100 MG tablet, Take 1 tablet (100 mg total) by mouth daily.   rosuvastatin (CRESTOR) 10 MG tablet, Take 10 mg by mouth daily.   tadalafil (CIALIS) 10 MG tablet, Take 1 tablet (10 mg total) by mouth daily as needed for erectile dysfunction.  Current Outpatient Medications (Respiratory):    Loratadine (CLARITIN) 10 MG CAPS, Take 10 mg by mouth as needed.     Current Outpatient Medications (Other):    Cholecalciferol (VITAMIN D-1000 MAX ST) 1000 units tablet, Take 2,000 mg by mouth daily.    Coenzyme Q10 (CO Q 10) 100 MG CAPS, Take 100 mg by mouth every morning.   escitalopram (LEXAPRO) 5 MG tablet, Take 10 mg by mouth daily.    LORazepam (ATIVAN) 1 MG tablet, Take 1 mg by mouth every 8 (eight) hours as needed for anxiety.   Multiple Vitamin (MULTI-VITAMINS) TABS, Take 1 tablet by mouth daily.   Omega-3 Fatty Acids (FISH OIL PO), Take 2 g by mouth daily.   Probiotic Product (PROBIOTIC ADVANCED PO), Take 1 tablet by mouth daily with breakfast.   Saw Palmetto 160 MG CAPS, Take 1,500 mg by mouth daily.    Past medical history, social, surgical and family history all reviewed in electronic medical record.  Barnett pertanent information unless stated regarding to the chief complaint.   Review of Systems:  Barnett headache, visual changes, nausea, vomiting, diarrhea, constipation, dizziness, abdominal pain, skin rash, fevers, chills, night sweats, weight loss, swollen lymph nodes, body aches, joint swelling,  chest pain, shortness of breath, mood changes.  Positive muscle aches  Objective  Blood pressure 122/70, pulse 70, height 6\' 2"  (1.88 m), weight 220 lb (99.8 kg), SpO2 98 %.    General: Barnett apparent distress alert and oriented x3 mood and affect normal, dressed appropriately.  HEENT: Pupils equal,  extraocular movements intact  Respiratory: Patient's speak in full sentences and does not appear short of breath  Cardiovascular: Barnett lower extremity edema, non tender, Barnett erythema  Skin: Warm dry intact with Barnett signs of infection or rash on extremities or on axial skeleton.  Abdomen: Soft nontender  Neuro: Cranial nerves II through XII are intact, neurovascularly intact in all extremities with 2+ DTRs and 2+ pulses.  Lymph: Barnett lymphadenopathy of posterior or anterior cervical chain or axillae bilaterally.  Gait normal with good balance and coordination.  MSK:  Non tender with full range of motion and good stability and symmetric strength and tone of  elbows, wrist,  knee and ankles bilaterally.   Right shoulder shows limited exam, and if patient has active range of motion to 85 degrees forward flexion 1 abduction.  Minimal mild external range of motion.  Crepitus noted with range of motion.  3 out of 5 strength in the rotator cuff.   Hip: left  ROM IR: 25 Deg, ER: 25 Deg, Flexion: 120 Deg, Extension: 100 Deg, Abduction: 45 Deg, Adduction: 45 Deg Strength IR: 5/5, ER: 5/5, Flexion: 5/5, Extension: 5/5, Abduction: 5/5, Adduction: 5/5 Pelvic alignment unremarkable to inspection and palpation. Standing hip rotation and gait without trendelenburg sign / unsteadiness. Greater trochanter without tenderness to palpation. Barnett tenderness over piriformis and greater trochanter. Mild posiotive faber  Barnett SI joint tenderness and normal minimal SI movement.   Impression and Recommendations:     This case required medical decision making of moderate complexity. The above documentation has been reviewed and is accurate and complete Lyndal Pulley, DO       Note: This dictation was prepared with Dragon dictation along with smaller phrase technology. Any transcriptional errors that result from this process are unintentional.

## 2019-10-04 NOTE — Assessment & Plan Note (Signed)
Improved with conservative therapy.  I discussed continuing the physical therapy as needed.  Discussed as long as patient is well no change management at this time.

## 2019-10-04 NOTE — Therapy (Signed)
[facility] [address], [place], [place], [address] Phone: [phone]   Fax:  [phone]  Physical Therapy Treatment  Patient Details  Name: Cory Barnett MRN: [id number] Date of Birth: [birth year] Referring Provider (PT): Dr. Tania Ade   Encounter Date: 10/04/2019  PT End of Session - 10/04/19 0844    Visit Number  17    Date for PT Re-Evaluation  11/07/19    Authorization Type  Medicare KX now secondary to previous PT this year;  20th visit progress note    PT Start Time  0800    PT Stop Time  0849    PT Time Calculation (min)  49 min    Activity Tolerance  Patient tolerated treatment well    Behavior During Therapy  [facility] for tasks assessed/performed       Past Medical History:  Diagnosis Date  . Anxiety   . AV block, 1st degree   . Basal cell carcinoma   . Cardiac murmur   . Cataract   . Elevated PSA measurement 2018  . Hyperlipidemia   . Hypertension 2015  . 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 IFG (impaired fasting glucose) 01/2016  . Pre-diabetes   . RBBB (right bundle branch block)   . Rheumatic fever    age 73 or 73, in hospital for a month, out of school for a yr  . Seasonal allergies   . Squamous cell carcinoma     Past Surgical History:  Procedure Laterality Date  . NASAL SEPTUM SURGERY    . NOSE SURGERY    . SHOULDER ARTHROSCOPY WITH SUBACROMIAL DECOMPRESSION Right 08/07/2019   Procedure: SHOULDER ARTHROSCOPY WITH SUBACROMIAL DECOMPRESSION;  Surgeon: Tania Ade, MD;  Location: [place];  Service: Orthopedics;  Laterality: Right;  . SKIN CANCER EXCISION     basal and squamous cell   . TONSILLECTOMY    . VASECTOMY    . WISDOM TOOTH EXTRACTION      There were no vitals filed for this visit.  Subjective Assessment - 10/04/19 0804    Subjective  I am a little sore today.  I think I overdid it.    Currently in Pain?  Yes    Pain Score  3     Pain Location   Shoulder    Pain Orientation  Right    Pain Descriptors / Indicators  Aching    Pain Type  Surgical pain    Pain Onset  More than a month ago    Pain Frequency  Intermittent    Aggravating Factors   overdoing it, reaching up and out    Pain Relieving Factors  rest, ice                       [facility] Adult PT Treatment/Exercise - 10/04/19 0001      Shoulder Exercises: Supine   Other Supine Exercises  attempted stabilization in supine and not able to tolerate due to pain      Shoulder Exercises: Seated   Other Seated Exercises  assisted place and holds 60, 90 degrees  5 sec holds x5     pain with this so didn't go above 90     Shoulder Exercises: Prone   Extension  AROM;Right;20 reps    Extension Weight (lbs)  2    Horizontal ABduction 1  AROM;Right;20 reps    Horizontal ABduction 1 Limitations  full range with hold at  top      Vasopneumatic   Number Minutes Vasopneumatic   10 minutes    Vasopnuematic Location   Shoulder   right, seated in chair    Vasopneumatic Pressure  Low    Vasopneumatic Temperature   3 snow flakes               PT Short Term Goals - 09/26/19 1701      PT SHORT TERM GOAL #1   Title  independent with initial HEP    Status  Achieved      PT SHORT TERM GOAL #2   Title  The patient will report a 30% improvement in shoulder pain with grooming/dressing tasks    Status  Achieved      PT SHORT TERM GOAL #3   Title  Right shoulder active or active assisted elevation to 80  degrees needed for home and work ADLs in sitting    Status  Achieved        PT Long Term Goals - 10/04/19 [id number]      PT LONG TERM GOAL #2   Title  The patient will have improved right shoulder elevation to 120 degrees needed for reaching eye level shelves and personal care    Time  6    Period  Weeks    Status  On-going      PT LONG TERM GOAL #3   Title  The patient will have improved shoulder pain with  usual ADLs by at least 80%    Baseline  using 60% of  normal.  Pain and challenge with overhead use    Time  6    Period  Weeks    Status  On-going            Plan - 10/04/19 0827    Clinical Impression Statement  Right shoulder elevation improved to 110 degrees in standing.  Pt has full passive ROM however painful at 90-100 degree ROM actively and with lowering his arm from an elevated position, consistent with non intact muscles in his rotator cuff.  Pt with increased Rt shoulder pain today due to overuse this week.   Pt with increased Rt shoulder pain with movement against gravity and with holds at 60 and 90 and required tactile cues for scapular depression.  Place and holds stopped due to increased pain.  Pt tolerated advancement of resistance with arm bike today.  Pt will continue to benefit from skilled PT to improve periscapular and accessory muscle strength to improve function and use of Rt UE.    PT Frequency  2x / week    PT Duration  6 weeks    PT Treatment/Interventions  ADLs/Self Care Home Management;Electrical Stimulation;Cryotherapy;Moist Heat;Therapeutic activities;Therapeutic exercise;Neuromuscular re-education;Manual techniques;Patient/family education;Taping;Vasopneumatic Device;Dry needling;Scar mobilization;Iontophoresis 4mg /ml Dexamethasone    PT Next Visit Plan  KX, strength of Rt shoulder and functional use, seated place and holds;   Lt hip exercises as needed.    PT Home Exercise Plan  Access Code: [email or web address]    Consulted and Agree with Plan of Care  Patient       Patient will benefit from skilled therapeutic intervention in order to improve the following deficits and impairments:  Decreased range of motion, Impaired UE functional use, Decreased activity tolerance, Impaired perceived functional ability, Pain, Decreased strength  Visit Diagnosis: Muscle weakness (generalized)  Acute pain of right shoulder  Stiffness of right shoulder, not elsewhere classified  Pain in left hip  Localized edema  Problem  List Patient Active Problem List   Diagnosis Date Noted  . Piriformis syndrome of left side 08/18/2019  . Rotator cuff tear, right 05/29/2019  . Closed fracture of distal clavicle 05/15/2019  . 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, [place], [place], [address] Phone: [phone]   Fax:  [phone]  Name: TAURICE PARLETTE MRN: [id number] Date of Birth: [birth year]

## 2019-10-04 NOTE — Patient Instructions (Signed)
     [address], 1st floor [place], [place] [address] Phone [phone]

## 2019-10-06 ENCOUNTER — Other Ambulatory Visit: Payer: Self-pay

## 2019-10-06 ENCOUNTER — Ambulatory Visit: Payer: Medicare Other | Admitting: Physical Therapy

## 2019-10-06 DIAGNOSIS — M6281 Muscle weakness (generalized): Secondary | ICD-10-CM

## 2019-10-06 DIAGNOSIS — R600 Localized edema: Secondary | ICD-10-CM

## 2019-10-06 DIAGNOSIS — M25511 Pain in right shoulder: Secondary | ICD-10-CM

## 2019-10-06 DIAGNOSIS — M25552 Pain in left hip: Secondary | ICD-10-CM

## 2019-10-06 DIAGNOSIS — M25611 Stiffness of right shoulder, not elsewhere classified: Secondary | ICD-10-CM

## 2019-10-06 NOTE — Therapy (Signed)
[facility] [address], [place], [place], [address] Phone: [phone]   Fax:  [phone]  Physical Therapy Treatment  Patient Details  Name: Cory Barnett MRN: [id number] Date of Birth: [birth year] Referring Provider (PT): Dr. Tania Ade   Encounter Date: 10/06/2019  PT End of Session - 10/06/19 0837    Visit Number  18    Date for PT Re-Evaluation  11/07/19    Authorization Type  Medicare KX now secondary to previous PT this year;  20th visit progress note    PT Start Time  0800    PT Stop Time  0850    PT Time Calculation (min)  50 min    Activity Tolerance  Patient tolerated treatment well       Past Medical History:  Diagnosis Date  . Anxiety   . AV block, 1st degree   . Basal cell carcinoma   . Cardiac murmur   . Cataract   . Elevated PSA measurement 2018  . Hyperlipidemia   . Hypertension 2015  . 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 IFG (impaired fasting glucose) 01/2016  . Pre-diabetes   . RBBB (right bundle branch block)   . Rheumatic fever    age 33 or 60, in hospital for a month, out of school for a yr  . Seasonal allergies   . Squamous cell carcinoma     Past Surgical History:  Procedure Laterality Date  . NASAL SEPTUM SURGERY    . NOSE SURGERY    . SHOULDER ARTHROSCOPY WITH SUBACROMIAL DECOMPRESSION Right 08/07/2019   Procedure: SHOULDER ARTHROSCOPY WITH SUBACROMIAL DECOMPRESSION;  Surgeon: Tania Ade, MD;  Location: [place];  Service: Orthopedics;  Laterality: Right;  . SKIN CANCER EXCISION     basal and squamous cell   . TONSILLECTOMY    . VASECTOMY    . WISDOM TOOTH EXTRACTION      There were no vitals filed for this visit.  Subjective Assessment - 10/06/19 0802    Subjective  Doctor appt went well.   He thinks I've about done what I can do.  My shoulder has been bothering me the last couple of days.  Very tight. It was a new pain in the front.  I've been  sitting at the desk working on the computer.    Patient Stated Goals  improve strength, function and ROM    Currently in Pain?  No/denies    Pain Score  0-No pain    Pain Orientation  Right    Multiple Pain Sites  No    Pain Score  0         [facility] PT Assessment - 10/06/19 0001      AROM   Right Shoulder Flexion  110 Degrees    Right Shoulder ABduction  95 Degrees    Right Shoulder Internal Rotation  --   T10                  [facility] Adult PT Treatment/Exercise - 10/06/19 0001      Knee/Hip Exercises: Stretches   Hip Flexor Stretch  Right;Left;5 reps    Hip Flexor Stretch Limitations  on 2nd step with left arm elevation and side bend       Shoulder Exercises: Supine   Other Supine Exercises  110 degrees to 170 degrees flexion 2x 10   tactile cue to block from going below 100 degrees     Shoulder Exercises:  Prone   Extension  Strengthening;Right;20 reps    Horizontal ABduction 1  AROM;Right;20 reps    Horizontal ABduction 1 Limitations  small range    Other Prone Exercises  pendulums 2 min       Shoulder Exercises: Standing   Other Standing Exercises  red band press down 10x painful       Shoulder Exercises: ROM/Strengthening   UBE (Upper Arm Bike)  L1 2 forward/2 backward     Other ROM/Strengthening Exercises  wall ladder to L32 20x      Vasopneumatic   Number Minutes Vasopneumatic   10 minutes    Vasopnuematic Location   Shoulder   right, seated in chair    Vasopneumatic Pressure  Low    Vasopneumatic Temperature   3 snow flakes               PT Short Term Goals - 09/26/19 1701      PT SHORT TERM GOAL #1   Title  independent with initial HEP    Status  Achieved      PT SHORT TERM GOAL #2   Title  The patient will report a 30% improvement in shoulder pain with grooming/dressing tasks    Status  Achieved      PT SHORT TERM GOAL #3   Title  Right shoulder active or active assisted elevation to 80  degrees needed for home and work ADLs in  sitting    Status  Achieved        PT Long Term Goals - 10/04/19 [id number]      PT LONG TERM GOAL #2   Title  The patient will have improved right shoulder elevation to 120 degrees needed for reaching eye level shelves and personal care    Time  6    Period  Weeks    Status  On-going      PT LONG TERM GOAL #3   Title  The patient will have improved shoulder pain with  usual ADLs by at least 80%    Baseline  using 60% of normal.  Pain and challenge with overhead use    Time  6    Period  Weeks    Status  On-going            Plan - 10/06/19 0843    Clinical Impression Statement  The patient reports increased pain and tightness this week although his ROM measurements have stayed consistent since last week.  Treatment modified  to avoid  painful arc. Therapist providing verbal and often tactile cues to provide a barrier from "going to far" into painful ranges of motion.  Vasocompression provides good pain relief following exercise.    Comorbidities  osteoarthritis knees, shoulder;  HTN    Examination-Activity Limitations  Reach Overhead;Self Feeding;Dressing;Lift;Carry;Locomotion Level    Rehab Potential  Good    PT Frequency  2x / week    PT Duration  6 weeks    PT Treatment/Interventions  ADLs/Self Care Home Management;Electrical Stimulation;Cryotherapy;Moist Heat;Therapeutic activities;Therapeutic exercise;Neuromuscular re-education;Manual techniques;Patient/family education;Taping;Vasopneumatic Device;Dry needling;Scar mobilization;Iontophoresis 4mg /ml Dexamethasone    PT Next Visit Plan  KX, strength of Rt shoulder and functional use, seated place and holds;   Lt hip exercises as needed.    PT Home Exercise Plan  Access Code: [email or web address]       Patient will benefit from skilled therapeutic intervention in order to improve the following deficits and impairments:  Decreased range of motion, Impaired UE functional use, Decreased activity tolerance, Impaired  perceived functional ability,  Pain, Decreased strength  Visit Diagnosis: Muscle weakness (generalized)  Acute pain of right shoulder  Stiffness of right shoulder, not elsewhere classified  Pain in left hip  Localized edema     Problem List Patient Active Problem List   Diagnosis Date Noted  . Piriformis syndrome of left side 08/18/2019  . Rotator cuff tear, right 05/29/2019  . Closed fracture of distal clavicle 05/15/2019  . 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, [place], [place], [address] Phone: [phone]   Fax:  [phone]  Name: Cory Barnett MRN: [id number] Date of Birth: July 21, 1946

## 2019-10-11 ENCOUNTER — Other Ambulatory Visit: Payer: Self-pay

## 2019-10-11 ENCOUNTER — Ambulatory Visit: Payer: Medicare Other

## 2019-10-11 DIAGNOSIS — R600 Localized edema: Secondary | ICD-10-CM

## 2019-10-11 DIAGNOSIS — M25552 Pain in left hip: Secondary | ICD-10-CM

## 2019-10-11 DIAGNOSIS — M6281 Muscle weakness (generalized): Secondary | ICD-10-CM

## 2019-10-11 DIAGNOSIS — M25511 Pain in right shoulder: Secondary | ICD-10-CM

## 2019-10-11 DIAGNOSIS — M25611 Stiffness of right shoulder, not elsewhere classified: Secondary | ICD-10-CM

## 2019-10-11 NOTE — Therapy (Signed)
[facility] [address] [place], [place], [address] Phone: [phone]   Fax:  [phone]  Physical Therapy Treatment  Patient Details  Name: Cory Barnett MRN: [id number] Date of Birth: [birth year] Referring Provider (PT): Dr. Tania Ade   Encounter Date: 10/11/2019  PT End of Session - 10/11/19 0852    Visit Number  19    Date for PT Re-Evaluation  11/07/19    Authorization Type  Medicare KX now secondary to previous PT this year;  20th visit progress note    PT Start Time  0804    PT Stop Time  0900    PT Time Calculation (min)  56 min    Activity Tolerance  Patient tolerated treatment well    Behavior During Therapy  [facility] for tasks assessed/performed       Past Medical History:  Diagnosis Date  . Anxiety   . AV block, 1st degree   . Basal cell carcinoma   . Cardiac murmur   . Cataract   . Elevated PSA measurement 2018  . Hyperlipidemia   . Hypertension 2015  . 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 IFG (impaired fasting glucose) 01/2016  . Pre-diabetes   . RBBB (right bundle branch block)   . Rheumatic fever    age 32 or 36, in hospital for a month, out of school for a yr  . Seasonal allergies   . Squamous cell carcinoma     Past Surgical History:  Procedure Laterality Date  . NASAL SEPTUM SURGERY    . NOSE SURGERY    . SHOULDER ARTHROSCOPY WITH SUBACROMIAL DECOMPRESSION Right 08/07/2019   Procedure: SHOULDER ARTHROSCOPY WITH SUBACROMIAL DECOMPRESSION;  Surgeon: Tania Ade, MD;  Location: [facility];  Service: Orthopedics;  Laterality: Right;  . SKIN CANCER EXCISION     basal and squamous cell   . TONSILLECTOMY    . VASECTOMY    . WISDOM TOOTH EXTRACTION      There were no vitals filed for this visit.  Subjective Assessment - 10/11/19 0809    Subjective  I feel like I am reaching the max that I will be able to get out of this shoulder.  I am considering platelet rich plasm  (PRP) injection from Dr Tamala Julian before considering a shoulder replacement next year.    Currently in Pain?  Yes    Pain Score  2     Pain Location  Shoulder    Pain Orientation  Right    Pain Descriptors / Indicators  Aching    Pain Type  Surgical pain    Pain Onset  More than a month ago    Pain Frequency  Intermittent    Aggravating Factors   overdoing it, reaching up and out    Pain Relieving Factors  rest and ice                       [facility] Adult PT Treatment/Exercise - 10/11/19 0001      Shoulder Exercises: Seated   Other Seated Exercises  shoulder flexion to 60 and 90 with resisted holds in multiple directions by PT      Shoulder Exercises: Prone   Extension  Strengthening;Right;20 reps    External Rotation  Strengthening;Right;5 reps    External Rotation Limitations  assisted by PT in partial ROM     Horizontal ABduction 1  AROM;Right;20 reps    Other Prone Exercises  pendulums 2 min       Shoulder Exercises: ROM/Strengthening   UBE (Upper Arm Bike)  Level 2 x 6 (3/3)    Other ROM/Strengthening Exercises  wall ladder to L32 20x, scaption x 20      Vasopneumatic   Number Minutes Vasopneumatic   10 minutes    Vasopnuematic Location   Shoulder   right, seated in chair    Vasopneumatic Pressure  Low    Vasopneumatic Temperature   3 snow flakes               PT Short Term Goals - 09/26/19 1701      PT SHORT TERM GOAL #1   Title  independent with initial HEP    Status  Achieved      PT SHORT TERM GOAL #2   Title  The patient will report a 30% improvement in shoulder pain with grooming/dressing tasks    Status  Achieved      PT SHORT TERM GOAL #3   Title  Right shoulder active or active assisted elevation to 80  degrees needed for home and work ADLs in sitting    Status  Achieved        PT Long Term Goals - 10/04/19 [id number]      PT LONG TERM GOAL #2   Title  The patient will have improved right shoulder elevation to 120 degrees needed for  reaching eye level shelves and personal care    Time  6    Period  Weeks    Status  On-going      PT LONG TERM GOAL #3   Title  The patient will have improved shoulder pain with  usual ADLs by at least 80%    Baseline  using 60% of normal.  Pain and challenge with overhead use    Time  6    Period  Weeks    Status  On-going            Plan - 10/11/19 0903    Clinical Impression Statement  Pt continues to work on Rt shoulder strength to regain function s/p rotator cuff tear.  Pt has limited A/ROM against gravity due to loss of rotator cuff muscles.  Pt was able to hold Rt UE against moderate resistance at 60 and 90 degrees.  Pt is able to perform increased exercise today due to reduced pain. Pt requires minor tactile cues to reduce scapular position with flexion and scaption in sitting and standing.  Pt denies any issues with his hip at this time and is walking regularly without limitation.  Pt will continue to benefit from skilled PT to address Rt shoulder strength and function within available limits.    Rehab Potential  Good    PT Frequency  2x / week    PT Duration  6 weeks    PT Treatment/Interventions  ADLs/Self Care Home Management;Electrical Stimulation;Cryotherapy;Moist Heat;Therapeutic activities;Therapeutic exercise;Neuromuscular re-education;Manual techniques;Patient/family education;Taping;Vasopneumatic Device;Dry needling;Scar mobilization;Iontophoresis 4mg /ml Dexamethasone    PT Next Visit Plan  KX,  20th visit progress note.  strength of Rt shoulder and functional use, seated place and holds;   Lt hip exercises as needed.    PT Home Exercise Plan  Access Code: [email or web address]    Recommended Other Services  recert is signed    Consulted and Agree with Plan of Care  Patient       Patient will benefit from skilled therapeutic intervention in order to improve the following deficits and impairments:  Decreased range of motion, Impaired UE functional use, Decreased activity  tolerance, Impaired perceived functional ability, Pain, Decreased strength  Visit Diagnosis: Muscle weakness (generalized)  Acute pain of right shoulder  Stiffness of right shoulder, not elsewhere classified  Pain in left hip  Localized edema     Problem List Patient Active Problem List   Diagnosis Date Noted  . Piriformis syndrome of left side 08/18/2019  . Rotator cuff tear, right 05/29/2019  . Closed fracture of distal clavicle 05/15/2019  . 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, [place], [place], [address] Phone: [phone]   Fax:  [phone]  Name: Cory Barnett MRN: [id number] Date of Birth: 01-07-1946

## 2019-10-13 ENCOUNTER — Ambulatory Visit: Payer: Medicare Other | Admitting: Physical Therapy

## 2019-10-13 ENCOUNTER — Other Ambulatory Visit: Payer: Self-pay

## 2019-10-13 ENCOUNTER — Encounter: Payer: Self-pay | Admitting: Physical Therapy

## 2019-10-13 DIAGNOSIS — M25511 Pain in right shoulder: Secondary | ICD-10-CM

## 2019-10-13 DIAGNOSIS — M25552 Pain in left hip: Secondary | ICD-10-CM

## 2019-10-13 DIAGNOSIS — R600 Localized edema: Secondary | ICD-10-CM

## 2019-10-13 DIAGNOSIS — M6281 Muscle weakness (generalized): Secondary | ICD-10-CM | POA: Diagnosis not present

## 2019-10-13 DIAGNOSIS — M25611 Stiffness of right shoulder, not elsewhere classified: Secondary | ICD-10-CM

## 2019-10-13 NOTE — Therapy (Signed)
[facility] [address] [place], [place], [address] Phone: [phone]   Fax:  [phone]  Physical Therapy Treatment  Patient Details  Name: Cory Barnett MRN: [id number] Date of Birth: August 09, 1946 Referring Provider (PT): Dr. Tania Ade  Progress Note Reporting Period 09/05/19 to 10/13/19  See note below for Objective Data and Assessment of Progress/Goals.       Encounter Date: 10/13/2019  PT End of Session - 10/13/19 0942    Visit Number  20    Date for PT Re-Evaluation  11/07/19    Authorization Type  Medicare KX now secondary to previous PT this year;  20th visit progress note    PT Start Time  0801    PT Stop Time  0845    PT Time Calculation (min)  44 min    Activity Tolerance  Patient tolerated treatment well       Past Medical History:  Diagnosis Date  . Anxiety   . AV block, 1st degree   . Basal cell carcinoma   . Cardiac murmur   . Cataract   . Elevated PSA measurement 2018  . Hyperlipidemia   . Hypertension 2015  . 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 IFG (impaired fasting glucose) 01/2016  . Pre-diabetes   . RBBB (right bundle branch block)   . Rheumatic fever    age 28 or 70, in hospital for a month, out of school for a yr  . Seasonal allergies   . Squamous cell carcinoma     Past Surgical History:  Procedure Laterality Date  . NASAL SEPTUM SURGERY    . NOSE SURGERY    . SHOULDER ARTHROSCOPY WITH SUBACROMIAL DECOMPRESSION Right 08/07/2019   Procedure: SHOULDER ARTHROSCOPY WITH SUBACROMIAL DECOMPRESSION;  Surgeon: Tania Ade, MD;  Location: [facility];  Service: Orthopedics;  Laterality: Right;  . SKIN CANCER EXCISION     basal and squamous cell   . TONSILLECTOMY    . VASECTOMY    . WISDOM TOOTH EXTRACTION      There were no vitals filed for this visit.  Subjective Assessment - 10/13/19 0806    Subjective  My shoulder has been pretty good this week  overall 80% better.   If it stays like it is I would probably have a shoulder replacement.  My  hip is not perfect but better about 90%.    Currently in Pain?  No/denies    Pain Score  0-No pain    Pain Score  0    Pain Location  Hip         [facility] PT Assessment - 10/13/19 0001      AROM   Right Shoulder Flexion  110 Degrees    Right Shoulder ABduction  95 Degrees    Right Shoulder External Rotation  40 Degrees      Strength   Overall Strength Comments  SLS 7 sec     Right Shoulder Flexion  3+/5    Right Shoulder ABduction  3-/5    Right Shoulder Internal Rotation  4+/5    Right Shoulder External Rotation  3+/5    Left Hip Flexion  5/5    Left Hip Extension  5/5    Left Hip ABduction  5/5                   [facility] Adult PT Treatment/Exercise - 10/13/19 0001      Therapeutic Activites  Therapeutic Activities  ADL's    ADL's  push, pull, carry, elevation UE use for ADLs       Knee/Hip Exercises: Stretches   Hip Flexor Stretch  Right;Left;5 reps    Hip Flexor Stretch Limitations  on 2nd step with left arm elevation and side bend       Shoulder Exercises: Seated   Other Seated Exercises  shoulder flexion place and holds 90, 100, 120, 130 5 sec holds 4x each       Shoulder Exercises: Prone   Flexion  Right;20 reps    Extension  Strengthening;Right;20 reps    Horizontal ABduction 1  AROM;Right;20 reps    Other Prone Exercises  pendulums 2 min       Shoulder Exercises: ROM/Strengthening   UBE (Upper Arm Bike)  Level 2 x 6 (3/3)    Other ROM/Strengthening Exercises  wall ladder to L32 20x, scaption x 20               PT Short Term Goals - 09/26/19 1701      PT SHORT TERM GOAL #1   Title  independent with initial HEP    Status  Achieved      PT SHORT TERM GOAL #2   Title  The patient will report a 30% improvement in shoulder pain with grooming/dressing tasks    Status  Achieved      PT SHORT TERM GOAL #3   Title  Right shoulder active or active  assisted elevation to 80  degrees needed for home and work ADLs in sitting    Status  Achieved        PT Long Term Goals - 10/13/19 0955      PT LONG TERM GOAL #1   Title  The patient will be independent in safe self progression of HEP  for further improvements in ROM and strength    Time  6    Period  Weeks    Status  On-going      PT LONG TERM GOAL #2   Time  6    Period  Weeks    Status  On-going      PT LONG TERM GOAL #3   Title  The patient will have improved shoulder pain with  usual ADLs by at least 80%    Status  Achieved      PT LONG TERM GOAL #4   Title  right shoulder abduction >/= 100 degrees to reach out for an item    Time  6    Period  Weeks    Status  On-going      PT LONG TERM GOAL #5   Title  The patient will have grossly 3+/5 to 4-/5 strength in deltoids and scapula stabilizers needed for light lifting needed at home and work    Time  8    Period  Weeks    Status  On-going      PT LONG TERM GOAL #6   Title  The patient will report a 60% reduction in left hip pain with rising sit to stand, walking > 2 blocks    Status  Achieved      PT LONG TERM GOAL #7   Title  The patient will have grossly 4/5 to 4+/5 left hip strength and single leg stand > 6 sec    Status  Achieved            Plan - 10/13/19 0943    Clinical Impression  Statement  The patient declines the need for vasocompression today.  He continues to have difficulty with reaching/elevation of his shoulder with many ADLS including washing his back, putting on a pullover shirt and any reaching forward or out to the side.  This is all consistent with limitations due to irreparable rotator cuff tears.  Verbal and tactile cues to activate periscapular muscles.  Therapist modifying exercises to avoid painful ranges of motion.  His left hip pain has improved significantly with pain reduction at night, with walking and strength grossly 5/5.  He rates his hip improvement at 90%.  Will continue 3 more  weeks with treatment focus on strengthening of supportive glenohumeral and scapular musculature to compensate for deficient rotator cuff and promote and independent HEP for long term.    Rehab Potential  Good    PT Frequency  2x / week    PT Duration  6 weeks    PT Treatment/Interventions  ADLs/Self Care Home Management;Electrical Stimulation;Cryotherapy;Moist Heat;Therapeutic activities;Therapeutic exercise;Neuromuscular re-education;Manual techniques;Patient/family education;Taping;Vasopneumatic Device;Dry needling;Scar mobilization;Iontophoresis 4mg /ml Dexamethasone    PT Next Visit Plan  KX,  strength of Rt shoulder and functional use, seated place and holds;   Lt hip exercises as needed.    PT Home Exercise Plan  Access Code: [email or web address]       Patient will benefit from skilled therapeutic intervention in order to improve the following deficits and impairments:     Visit Diagnosis: Muscle weakness (generalized)  Acute pain of right shoulder  Stiffness of right shoulder, not elsewhere classified  Pain in left hip  Localized edema     Problem List Patient Active Problem List   Diagnosis Date Noted  . Piriformis syndrome of left side 08/18/2019  . Rotator cuff tear, right 05/29/2019  . Closed fracture of distal clavicle 05/15/2019  . 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   [name], PT 10/13/19 9:58 AM Phone: [phone] Fax: [phone] [name] 10/13/2019, 9:57 AM  [facility] [address], [place], [place], [address] Phone: [phone]   Fax:  [phone]  Name: Cory Barnett MRN: [id number] Date of Birth: July 04, 1946

## 2019-10-18 ENCOUNTER — Other Ambulatory Visit: Payer: Self-pay

## 2019-10-18 ENCOUNTER — Ambulatory Visit: Payer: Medicare Other

## 2019-10-18 DIAGNOSIS — M6281 Muscle weakness (generalized): Secondary | ICD-10-CM | POA: Diagnosis not present

## 2019-10-18 DIAGNOSIS — M25511 Pain in right shoulder: Secondary | ICD-10-CM

## 2019-10-18 DIAGNOSIS — M25611 Stiffness of right shoulder, not elsewhere classified: Secondary | ICD-10-CM

## 2019-10-18 NOTE — Therapy (Signed)
[facility] [address] [place], [place], [address] Phone: [phone]   Fax:  [phone]  Physical Therapy Treatment  Patient Details  Name: Cory Barnett MRN: [id number] Date of Birth: [birth year] Referring Provider (PT): Dr. Tania Ade   Encounter Date: 10/18/2019  PT End of Session - 10/18/19 0849    Visit Number  21    Date for PT Re-Evaluation  11/07/19    Authorization Type  Medicare KX now secondary to previous PT this year;  20th visit progress note    PT Start Time  0800    PT Stop Time  0841    PT Time Calculation (min)  41 min    Activity Tolerance  Patient tolerated treatment well    Behavior During Therapy  [facility] for tasks assessed/performed       Past Medical History:  Diagnosis Date  . Anxiety   . AV block, 1st degree   . Basal cell carcinoma   . Cardiac murmur   . Cataract   . Elevated PSA measurement 2018  . Hyperlipidemia   . Hypertension 2015  . 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 IFG (impaired fasting glucose) 01/2016  . Pre-diabetes   . RBBB (right bundle branch block)   . Rheumatic fever    age 60 or 46, in hospital for a month, out of school for a yr  . Seasonal allergies   . Squamous cell carcinoma     Past Surgical History:  Procedure Laterality Date  . NASAL SEPTUM SURGERY    . NOSE SURGERY    . SHOULDER ARTHROSCOPY WITH SUBACROMIAL DECOMPRESSION Right 08/07/2019   Procedure: SHOULDER ARTHROSCOPY WITH SUBACROMIAL DECOMPRESSION;  Surgeon: Tania Ade, MD;  Location: [facility];  Service: Orthopedics;  Laterality: Right;  . SKIN CANCER EXCISION     basal and squamous cell   . TONSILLECTOMY    . VASECTOMY    . WISDOM TOOTH EXTRACTION      There were no vitals filed for this visit.  Subjective Assessment - 10/18/19 0803    Subjective  I am not really getting any additional range of motion.  I don't have any pain.  My hip is feeling good.  I have been  able to take walks with my wife.    Currently in Pain?  No/denies                       [facility] Adult PT Treatment/Exercise - 10/18/19 0001      Knee/Hip Exercises: Stretches   Hip Flexor Stretch  Right;Left;5 reps    Hip Flexor Stretch Limitations  on 2nd step with left arm elevation and side bend       Shoulder Exercises: Seated   Other Seated Exercises  attempted place and hold- too much discomfort with this.       Shoulder Exercises: Prone   Flexion  Right;20 reps    Flexion Weight (lbs)  2   partial range   Extension  Strengthening;Right;20 reps    Extension Weight (lbs)  2    Horizontal ABduction 1  AROM;Right;20 reps    Horizontal ABduction 1 Weight (lbs)  2    Horizontal ABduction 1 Limitations  partial range    Other Prone Exercises  pendulums 2 min       Shoulder Exercises: Standing   Other Standing Exercises  wall push ups: x10      Shoulder Exercises: ROM/Strengthening  UBE (Upper Arm Bike)  Level 2 x 6 (3/3)    Other ROM/Strengthening Exercises  wall ladder to L32 20x, scaption x 20.  1# added.                PT Short Term Goals - 09/26/19 1701      PT SHORT TERM GOAL #1   Title  independent with initial HEP    Status  Achieved      PT SHORT TERM GOAL #2   Title  The patient will report a 30% improvement in shoulder pain with grooming/dressing tasks    Status  Achieved      PT SHORT TERM GOAL #3   Title  Right shoulder active or active assisted elevation to 80  degrees needed for home and work ADLs in sitting    Status  Achieved        PT Long Term Goals - 10/18/19 0805      PT LONG TERM GOAL #2   Title  The patient will have improved right shoulder elevation to 120 degrees needed for reaching eye level shelves and personal care    Baseline  110    Time  6    Period  Weeks    Status  On-going            Plan - 10/18/19 0814    Clinical Impression Statement  Pt  continues to have difficulty with reaching/elevation  of his shoulder with dressing and self-care.  His ROM limitations are consistent with irreparable rotator cuff tears. Pt did well with addition of 1# weight with finger ladder.   Verbal and tactile cues to activate periscapular muscles to assist with functional motion.  Pt continue 3 more weeks with treatment focus on strengthening of supportive glenohumeral and scapular musculature to compensate for deficient rotator cuff and promote and independent HEP for long term.    Examination-Activity Limitations  Reach Overhead;Self Feeding;Dressing;Lift;Carry;Locomotion Level    PT Frequency  2x / week    PT Duration  6 weeks    PT Treatment/Interventions  ADLs/Self Care Home Management;Electrical Stimulation;Cryotherapy;Moist Heat;Therapeutic activities;Therapeutic exercise;Neuromuscular re-education;Manual techniques;Patient/family education;Taping;Vasopneumatic Device;Dry needling;Scar mobilization;Iontophoresis 4mg /ml Dexamethasone    PT Next Visit Plan  KX,  strength of Rt shoulder and functional use, seated place and holds;   Lt hip exercises as needed.    PT Home Exercise Plan  Access Code: [email or web address]    Consulted and Agree with Plan of Care  Patient       Patient will benefit from skilled therapeutic intervention in order to improve the following deficits and impairments:  Decreased range of motion, Impaired UE functional use, Decreased activity tolerance, Impaired perceived functional ability, Pain, Decreased strength  Visit Diagnosis: Muscle weakness (generalized)  Acute pain of right shoulder  Stiffness of right shoulder, not elsewhere classified     Problem List Patient Active Problem List   Diagnosis Date Noted  . Piriformis syndrome of left side 08/18/2019  . Rotator cuff tear, right 05/29/2019  . Closed fracture of distal clavicle 05/15/2019  . 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    Cory Barnett, PT 10/18/19 8:54 AM  [facility] [address], [place], [place], [address] Phone: [phone]   Fax:  [phone]  Name: Cory Barnett MRN: [id number] Date of Birth: 1946/10/07

## 2019-10-20 ENCOUNTER — Other Ambulatory Visit: Payer: Self-pay

## 2019-10-20 ENCOUNTER — Ambulatory Visit: Payer: Medicare Other | Admitting: Physical Therapy

## 2019-10-20 DIAGNOSIS — M25611 Stiffness of right shoulder, not elsewhere classified: Secondary | ICD-10-CM

## 2019-10-20 DIAGNOSIS — M6281 Muscle weakness (generalized): Secondary | ICD-10-CM

## 2019-10-20 DIAGNOSIS — M25552 Pain in left hip: Secondary | ICD-10-CM

## 2019-10-20 DIAGNOSIS — M25511 Pain in right shoulder: Secondary | ICD-10-CM

## 2019-10-20 DIAGNOSIS — R600 Localized edema: Secondary | ICD-10-CM

## 2019-10-20 NOTE — Therapy (Signed)
[facility] [address] [place], [place], [address] Phone: [phone]   Fax:  [phone]  Physical Therapy Treatment  Patient Details  Name: Cory Barnett MRN: [id number] Date of Birth: 08-Feb-1946 Referring Provider (PT): Dr. Tania Ade   Encounter Date: 10/20/2019  PT End of Session - 10/20/19 0804    Visit Number  22    Date for PT Re-Evaluation  11/07/19    Authorization Type  Medicare KX now secondary to previous PT this year;  20th visit progress note    PT Start Time  0800    PT Stop Time  0844    PT Time Calculation (min)  44 min    Activity Tolerance  Patient tolerated treatment well       Past Medical History:  Diagnosis Date  . Anxiety   . AV block, 1st degree   . Basal cell carcinoma   . Cardiac murmur   . Cataract   . Elevated PSA measurement 2018  . Hyperlipidemia   . Hypertension 2015  . 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 IFG (impaired fasting glucose) 01/2016  . Pre-diabetes   . RBBB (right bundle branch block)   . Rheumatic fever    age 62 or 23, in hospital for a month, out of school for a yr  . Seasonal allergies   . Squamous cell carcinoma     Past Surgical History:  Procedure Laterality Date  . NASAL SEPTUM SURGERY    . NOSE SURGERY    . SHOULDER ARTHROSCOPY WITH SUBACROMIAL DECOMPRESSION Right 08/07/2019   Procedure: SHOULDER ARTHROSCOPY WITH SUBACROMIAL DECOMPRESSION;  Surgeon: Tania Ade, MD;  Location: [place];  Service: Orthopedics;  Laterality: Right;  . SKIN CANCER EXCISION     basal and squamous cell   . TONSILLECTOMY    . VASECTOMY    . WISDOM TOOTH EXTRACTION      There were no vitals filed for this visit.  Subjective Assessment - 10/20/19 0801    Subjective  I had a good bit of shoulder pain yesterday.  I tried to do this yoga video and I was in a lot of pain.    Pertinent History  elevation within visual periphery    Currently in  Pain?  No/denies    Pain Score  4     Pain Location  Shoulder    Pain Orientation  Right         [facility] PT Assessment - 10/20/19 0001      AROM   Right Shoulder Flexion  --   place and hold 140 active assisted                  [facility] Adult PT Treatment/Exercise - 10/20/19 0001      Knee/Hip Exercises: Stretches   Hip Flexor Stretch  Right;Left;5 reps    Hip Flexor Stretch Limitations  on 2nd step with left arm elevation and side bend       Shoulder Exercises: Seated   Other Seated Exercises  shoulder flexion place and holds 90, 100, 120, 130 5 sec holds      Shoulder Exercises: Prone   Flexion  Right;20 reps    Flexion Weight (lbs)  1   partial range   Extension  Strengthening;Right;20 reps    Extension Weight (lbs)  1    Horizontal ABduction 1  AROM;Right;20 reps    Horizontal ABduction 1 Weight (lbs)  1  Horizontal ABduction 1 Limitations  partial range    Horizontal ABduction 2 Limitations  scaption 1# 15x     Other Prone Exercises  pendulums 2 min       Shoulder Exercises: Standing   Other Standing Exercises  wall push ups: x10      Shoulder Exercises: ROM/Strengthening   UBE (Upper Arm Bike)  Level 2 x 6 (3/3)    Other ROM/Strengthening Exercises  wall ladder to L32 20x, scaption x 20.  1# added.       Vasopneumatic   Number Minutes Vasopneumatic   10 minutes    Vasopnuematic Location   Shoulder   right, seated in chair    Vasopneumatic Pressure  Low    Vasopneumatic Temperature   3 snow flakes               PT Short Term Goals - 09/26/19 1701      PT SHORT TERM GOAL #1   Title  independent with initial HEP    Status  Achieved      PT SHORT TERM GOAL #2   Title  The patient will report a 30% improvement in shoulder pain with grooming/dressing tasks    Status  Achieved      PT SHORT TERM GOAL #3   Title  Right shoulder active or active assisted elevation to 80  degrees needed for home and work ADLs in sitting    Status  Achieved         PT Long Term Goals - 10/18/19 0805      PT LONG TERM GOAL #2   Title  The patient will have improved right shoulder elevation to 120 degrees needed for reaching eye level shelves and personal care    Baseline  110    Time  6    Period  Weeks    Status  On-going            Plan - 10/20/19 0837    Clinical Impression Statement  Treatment modified with lighter resistance and fewer reps with some exercises secondary to increased pain he associates from his yoga video yesterday.  Resumed vasocompression at his request for pain control.  Discussed a maintenance HEP upon discharge from PT for general UE strengthening.  Encouraged daily active assisted overhead elevation.  Therapist closely monitoring response and modifying as needed for pain.    Comorbidities  osteoarthritis knees, shoulder;  HTN    Rehab Potential  Good    PT Frequency  2x / week    PT Duration  6 weeks    PT Treatment/Interventions  ADLs/Self Care Home Management;Electrical Stimulation;Cryotherapy;Moist Heat;Therapeutic activities;Therapeutic exercise;Neuromuscular re-education;Manual techniques;Patient/family education;Taping;Vasopneumatic Device;Dry needling;Scar mobilization;Iontophoresis 4mg /ml Dexamethasone    PT Next Visit Plan  KX,  strength of Rt shoulder and functional use, seated place and holds;   Lt hip exercises as needed. Anticipate readiness for discharge in 3 more visits.    PT Home Exercise Plan  Access Code: [email or web address]       Patient will benefit from skilled therapeutic intervention in order to improve the following deficits and impairments:  Decreased range of motion, Impaired UE functional use, Decreased activity tolerance, Impaired perceived functional ability, Pain, Decreased strength  Visit Diagnosis: Muscle weakness (generalized)  Acute pain of right shoulder  Stiffness of right shoulder, not elsewhere classified  Pain in left hip  Localized edema     Problem List Patient  Active Problem List   Diagnosis Date Noted  . Piriformis syndrome of  left side 08/18/2019  . Rotator cuff tear, right 05/29/2019  . Closed fracture of distal clavicle 05/15/2019  . 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   Cory Barnett, PT 10/20/19 8:44 AM Phone: [phone] Fax: [phone] [name] 10/20/2019, 8:44 AM  [facility] [address] [place], [place], [address] Phone: [phone]   Fax:  [phone]  Name: Cory Barnett MRN: [id number] Date of Birth: 04-01-1946

## 2019-10-22 ENCOUNTER — Other Ambulatory Visit: Payer: Self-pay | Admitting: Family Medicine

## 2019-10-24 ENCOUNTER — Other Ambulatory Visit: Payer: Self-pay

## 2019-10-24 ENCOUNTER — Ambulatory Visit: Payer: Medicare Other

## 2019-10-24 DIAGNOSIS — M6281 Muscle weakness (generalized): Secondary | ICD-10-CM | POA: Diagnosis not present

## 2019-10-24 DIAGNOSIS — M25511 Pain in right shoulder: Secondary | ICD-10-CM

## 2019-10-24 DIAGNOSIS — M25552 Pain in left hip: Secondary | ICD-10-CM

## 2019-10-24 DIAGNOSIS — M25611 Stiffness of right shoulder, not elsewhere classified: Secondary | ICD-10-CM

## 2019-10-24 NOTE — Therapy (Signed)
[facility] [address] [place], [place], [address] Phone: [phone]   Fax:  [phone]  Physical Therapy Treatment  Patient Details  Name: Cory Barnett MRN: [id number] Date of Birth: 1945-12-14 Referring Provider (Cory Barnett): Dr. Tania Ade   Encounter Date: 10/24/2019  Cory Barnett End of Session - 10/24/19 0846    Visit Number  23    Date for Cory Barnett Re-Evaluation  11/07/19    Authorization Type  Medicare KX now secondary to previous Cory Barnett this year;  20th visit progress note    Cory Barnett Start Time  0801    Cory Barnett Stop Time  0857    Cory Barnett Time Calculation (min)  56 min    Activity Tolerance  Patient tolerated treatment well    Behavior During Therapy  [facility] for tasks assessed/performed       Past Medical History:  Diagnosis Date  . Anxiety   . AV block, 1st degree   . Basal cell carcinoma   . Cardiac murmur   . Cataract   . Elevated PSA measurement 2018  . Hyperlipidemia   . Hypertension 2015  . 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 IFG (impaired fasting glucose) 01/2016  . Pre-diabetes   . RBBB (right bundle branch block)   . Rheumatic fever    age 57 or 30, in hospital for a month, out of school for a yr  . Seasonal allergies   . Squamous cell carcinoma     Past Surgical History:  Procedure Laterality Date  . NASAL SEPTUM SURGERY    . NOSE SURGERY    . SHOULDER ARTHROSCOPY WITH SUBACROMIAL DECOMPRESSION Right 08/07/2019   Procedure: SHOULDER ARTHROSCOPY WITH SUBACROMIAL DECOMPRESSION;  Surgeon: Tania Ade, MD;  Location: [place];  Service: Orthopedics;  Laterality: Right;  . SKIN CANCER EXCISION     basal and squamous cell   . TONSILLECTOMY    . VASECTOMY    . WISDOM TOOTH EXTRACTION      There were no vitals filed for this visit.  Subjective Assessment - 10/24/19 0807    Subjective  I'm doing OK.  No pain today.    Currently in Pain?  No/denies                       [facility] Cory Barnett  Treatment/Exercise - 10/24/19 0001      Shoulder Exercises: Seated   Other Seated Exercises  shoulder flexion place and holds 60 degrees      Shoulder Exercises: Prone   Flexion  Right;20 reps    Flexion Weight (lbs)  1   partial range   Extension  Strengthening;Right;20 reps    Extension Weight (lbs)  3    Horizontal ABduction 1  AROM;Right;20 reps    Horizontal ABduction 1 Weight (lbs)  1    Horizontal ABduction 1 Limitations  partial range    Horizontal ABduction 2 Limitations  scaption 1# 15x     Other Prone Exercises  pendulums 2 min       Shoulder Exercises: Standing   Other Standing Exercises  wall push ups: 2x10      Shoulder Exercises: ROM/Strengthening   UBE (Upper Arm Bike)  Level 2 x 6 (3/3)    Other ROM/Strengthening Exercises  wall ladder to L32 20x, scaption x 20.  1# added.       Vasopneumatic   Number Minutes Vasopneumatic   10 minutes    Vasopnuematic Location  Shoulder   right, seated in chair    Vasopneumatic Pressure  Low    Vasopneumatic Temperature   3 snow flakes               Cory Barnett Short Term Goals - 09/26/19 1701      Cory Barnett SHORT TERM GOAL #1   Title  independent with initial HEP    Status  Achieved      Cory Barnett SHORT TERM GOAL #2   Title  The patient will report a 30% improvement in shoulder pain with grooming/dressing tasks    Status  Achieved      Cory Barnett SHORT TERM GOAL #3   Title  Right shoulder active or active assisted elevation to 80  degrees needed for home and work ADLs in sitting    Status  Achieved        Cory Barnett Long Term Goals - 10/18/19 0805      Cory Barnett LONG TERM GOAL #2   Title  The patient will have improved right shoulder elevation to 120 degrees needed for reaching eye level shelves and personal care    Baseline  110    Time  6    Period  Weeks    Status  On-going            Plan - 10/24/19 [id number]    Clinical Impression Statement  Cory Barnett was closely monitored today for pain with activity and modifications were made to adjust  based on pain and strength against gravity.  Cory Barnett was able to reach and hold at 60 degrees of shoulder flexion x 5 reps without scapular elevation.  Cory Barnett required minor tactile cues for use of Rt UE against gravity.   Discussed a maintenance HEP upon discharge from Cory Barnett for general UE strengthening.  Encouraged daily active assisted overhead elevation.  Cory Barnett will attend 2-3 more sessions to improve Rt UE strength within available limits due to rotator cuff damage.    Cory Barnett Treatment/Interventions  ADLs/Self Care Home Management;Electrical Stimulation;Cryotherapy;Moist Heat;Therapeutic activities;Therapeutic exercise;Neuromuscular re-education;Manual techniques;Patient/family education;Taping;Vasopneumatic Device;Dry needling;Scar mobilization;Iontophoresis 4mg /ml Dexamethasone    Cory Barnett Next Visit Plan  KX,  strength of Rt shoulder and functional use, seated place and holds;   Lt hip exercises as needed. Anticipate readiness for discharge in 2-3 more visits.    Cory Barnett Home Exercise Plan  Access Code: [email or web address]    Consulted and Agree with Plan of Care  Patient       Patient will benefit from skilled therapeutic intervention in order to improve the following deficits and impairments:  Decreased range of motion, Impaired UE functional use, Decreased activity tolerance, Impaired perceived functional ability, Pain, Decreased strength  Visit Diagnosis: Muscle weakness (generalized)  Acute pain of right shoulder  Stiffness of right shoulder, not elsewhere classified  Pain in left hip     Problem List Patient Active Problem List   Diagnosis Date Noted  . Piriformis syndrome of left side 08/18/2019  . Rotator cuff tear, right 05/29/2019  . Closed fracture of distal clavicle 05/15/2019  . 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     Cory Barnett, Cory Barnett 10/24/19 8:47 AM  [facility] [address], [place], [place], [address] Phone: [phone]    Fax:  [phone]  Name: Cory Barnett MRN: [id number] Date of Birth: 02-May-1946

## 2019-10-25 NOTE — Progress Notes (Signed)
Cardiology Office Note   Date:  10/26/2019   ID:  Cory Barnett, DOB 1946-03-09, MRN [id number]  PCP:  Crist Infante, MD  Cardiologist: Lubertha South CC: Follow Up   History of Present Illness: Cory Barnett is a 73 y.o. male who presents for ongoing assessment and management of palpitations. On review of previous EKG's the symptoms were felt to be related to PVC's and PAC's. Cardiac monitor was recommended if symptoms persisted.  Cory Barnett is a father of one of our cardiologist, Dr. Sherren Mocha.  On last office visit with Dr. Stanford Breed on 03/13/2019 Cory Barnett was hypertensive and therefore 100 mg daily,  Follow up labs are to be completed today.  Cory Barnett comes today with complaints of fatigue, and some chest pressure.  He admits that he has been under a lot of stress with family health issues.  He states that he also continues to work and is often more tired when he returns home.  He states he tries to stay active and denies any chest pain, dyspnea, or extreme fatigue while completing normal activities.  He is concerned about the chest pressure, but more so about the fatigue.   Past Medical History:  Diagnosis Date  . Anxiety   . AV block, 1st degree   . Basal cell carcinoma   . Cardiac murmur   . Cataract   . Elevated PSA measurement 2018  . Hyperlipidemia   . Hypertension 2015  . 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 IFG (impaired fasting glucose) 01/2016  . Pre-diabetes   . RBBB (right bundle branch block)   . Rheumatic fever    age 48 or 70, in hospital for a month, out of school for a yr  . Seasonal allergies   . Squamous cell carcinoma     Past Surgical History:  Procedure Laterality Date  . NASAL SEPTUM SURGERY    . NOSE SURGERY    . SHOULDER ARTHROSCOPY WITH SUBACROMIAL DECOMPRESSION Right 08/07/2019   Procedure: SHOULDER ARTHROSCOPY WITH SUBACROMIAL DECOMPRESSION;  Surgeon: Tania Ade, MD;  Location: [place];  Service: Orthopedics;  Laterality:  Right;  . SKIN CANCER EXCISION     basal and squamous cell   . TONSILLECTOMY    . VASECTOMY    . WISDOM TOOTH EXTRACTION       Current Outpatient Medications  Medication Sig Dispense Refill  . Coenzyme Q10 (CO Q 10) 100 MG CAPS Take 100 mg by mouth every morning.    . escitalopram (LEXAPRO) 10 MG tablet Take 10 mg by mouth daily.     . Loratadine (CLARITIN) 10 MG CAPS Take 10 mg by mouth as needed.     Marland Kitchen LORazepam (ATIVAN) 1 MG tablet Take 1 mg by mouth every 8 (eight) hours as needed for anxiety.    Marland Kitchen losartan (COZAAR) 100 MG tablet Take 1 tablet (100 mg total) by mouth daily. 90 tablet 3  . Multiple Vitamin (MULTI-VITAMINS) TABS Take 1 tablet by mouth daily.    . Omega-3 Fatty Acids (FISH OIL PO) Take 2 g by mouth daily.    . Probiotic Product (PROBIOTIC ADVANCED PO) Take 1 tablet by mouth daily with breakfast.    . rosuvastatin (CRESTOR) 10 MG tablet Take 10 mg by mouth daily.    . Saw Palmetto 160 MG CAPS Take 1,500 mg by mouth daily.    . tadalafil (CIALIS) 10 MG tablet Take 1 tablet (10 mg total) by mouth daily as needed for erectile  dysfunction. 10 tablet 0   No current facility-administered medications for this visit.    Allergies:   Bee venom and Moxifloxacin    Social History:  The patient  reports that he has quit smoking. He has never used smokeless tobacco. He reports current alcohol use of about 2.0 standard drinks of alcohol per week. He reports that he does not use drugs.   Family History:  The patient's family history includes Arthritis in his father, mother, sister, and sister; Bipolar disorder in his sister; Cancer in his mother; Dementia in his mother; Mesothelioma in his father; Stroke in his mother.    ROS: All other systems are reviewed and negative. Unless otherwise mentioned in H&P    PHYSICAL EXAM: VS:  BP (!) 152/82   Pulse 68   Ht 6\' 2"  (1.88 m)   Wt 217 lb 3.2 oz (98.5 kg)   SpO2 99%   BMI 27.89 kg/m  , BMI Body mass index is 27.89 kg/m. GEN:  Well nourished, well developed, in no acute distress HEENT: normal Neck: no JVD, carotid bruits, or masses Cardiac: RRR; 1/6 systolic murmur heard best at the apex and LSB,  rubs, or gallops,no edema  Respiratory:  Clear to auscultation bilaterally, normal work of breathing GI: soft, nontender, nondistended, + BS MS: no deformity or atrophy Skin: warm and dry, no rash Neuro:  Strength and sensation are intact Psych: euthymic mood, full affect   EKG: Sinus rhythm first-degree AV block, right bundle branch block ventricular rate of 64 bpm, (no change from prior EKG in October 2020).  Recent Labs: No results found for requested labs within last 8760 hours.    Lipid Panel No results found for: CHOL, TRIG, HDL, CHOLHDL, VLDL, LDLCALC, LDLDIRECT    Wt Readings from Last 3 Encounters:  10/26/19 217 lb 3.2 oz (98.5 kg)  10/04/19 220 lb (99.8 kg)  08/18/19 234 lb (106.1 kg)      Other studies Reviewed: Echocardiogram 06/23/2016 Left ventricle: The cavity size was normal. 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 Systolic function was   normal. The estimated ejection fraction was in the range of 55%   to 60%. Wall motion was normal; there were no regional wall   motion abnormalities. Left ventricular diastolic function   parameters were normal. - Aortic valve: There was trivial regurgitation. - Left atrium: The atrium was mildly dilated. - Atrial septum: No defect or patent foramen ovale was identified.   ASSESSMENT AND PLAN:  1.  Hypertension: Blood pressure is not controlled today.  I did recheck it in the office and found it to be 158/78.  He is currently on losartan 100 mg daily.  He states that his blood pressure is much lower at home.  I have advised him to take his blood pressure at the same time every day and record this and bring back with him on follow-up appointments.  For now we will not make any changes in his regimen.  I will request labs from Dr.  Tempie Donning office for documentation of current kidney status on ARB  2.  Fatigue: He states he is notices becoming worse over the last few months.  He believes it may be related to some stress within his family concerning illnesses.  He also continues to work and is more tired when he returns home.  Due to cardiovascular risk factors of age, hypertension, and hypercholesterolemia, we discussed cardiac CT versus stress test.  With shared decision making  he would like to undergo nuclear medicine exercise stress test to begin with.  He is willing to proceed with more invasive testing should this test be abnormal.  I have discussed the process of the test and he verbalizes understanding and will proceed.  3.  Mitral valve murmur: He states he had a history of rheumatic heart disease as a child.  I will check an echocardiogram as 1 has not been completed for the last 3 years just to evaluate his current status, and contribution to his fatigue.  4.  Hypercholesterolemia: Remains on rosuvastatin 10 mg daily.  We will request labs from Dr. Tempie Donning office for current status  Current medicines are reviewed at length with the patient today.    Labs/ tests ordered today include: Nuclear medicine stress test, echocardiogram.  Phill Myron. [place] Pugh, ANP, AACC   10/26/2019 12:24 PM    [place] [address] Office [phone] Fax [phone]  Notice: This dictation was prepared with Dragon dictation along with smaller phrase technology. Any transcriptional errors that result from this process are unintentional and may not be corrected upon review.

## 2019-10-26 ENCOUNTER — Encounter: Payer: Self-pay | Admitting: Physical Therapy

## 2019-10-26 ENCOUNTER — Other Ambulatory Visit: Payer: Self-pay

## 2019-10-26 ENCOUNTER — Encounter: Payer: Self-pay | Admitting: Adult Health

## 2019-10-26 ENCOUNTER — Encounter: Payer: Self-pay | Admitting: Cardiology

## 2019-10-26 ENCOUNTER — Ambulatory Visit: Payer: Medicare Other | Admitting: Physical Therapy

## 2019-10-26 ENCOUNTER — Ambulatory Visit (INDEPENDENT_AMBULATORY_CARE_PROVIDER_SITE_OTHER): Payer: Medicare Other | Admitting: Adult Health

## 2019-10-26 VITALS — BP 152/82 | HR 68 | Ht 74.0 in | Wt 217.2 lb

## 2019-10-26 DIAGNOSIS — R5383 Other fatigue: Secondary | ICD-10-CM

## 2019-10-26 DIAGNOSIS — I10 Essential (primary) hypertension: Secondary | ICD-10-CM

## 2019-10-26 DIAGNOSIS — R600 Localized edema: Secondary | ICD-10-CM

## 2019-10-26 DIAGNOSIS — M25552 Pain in left hip: Secondary | ICD-10-CM

## 2019-10-26 DIAGNOSIS — M6281 Muscle weakness (generalized): Secondary | ICD-10-CM

## 2019-10-26 DIAGNOSIS — E7800 Pure hypercholesterolemia, unspecified: Secondary | ICD-10-CM

## 2019-10-26 DIAGNOSIS — M25511 Pain in right shoulder: Secondary | ICD-10-CM

## 2019-10-26 DIAGNOSIS — R0789 Other chest pain: Secondary | ICD-10-CM

## 2019-10-26 DIAGNOSIS — M25611 Stiffness of right shoulder, not elsewhere classified: Secondary | ICD-10-CM

## 2019-10-26 NOTE — Patient Instructions (Addendum)
Medication Instructions:  Continue current medications  If you need a refill on your cardiac medications before your next appointment, please call your pharmacy.  Labwork: None Ordered   Testing/Procedures: Your physician has requested that you have an exercise stress myoview. For further information please visit [email or web address]. Please follow instruction sheet, as given.  Your physician has requested that you have an echocardiogram. Echocardiography is a painless test that uses sound waves to create images of your heart. It provides your doctor with information about the size and shape of your heart and how well your heart's chambers and valves are working. This procedure takes approximately one hour. There are no restrictions for this procedure.   Special Instructions: You will need a Covid-19 test done prior to your Stress Test  PLEASE READ AND FOLLOW SALTY 6 ATTACHED  Reduce your risk of getting COVID-19 With your heart disease it is especially important for people at increased risk of severe illness from COVID-19, and those who live with them, to protect themselves from getting COVID-19. The best way to protect yourself and to help reduce the spread of the virus that causes COVID-19 is to: Marland Kitchen Limit your interactions with other people as much as possible. . Take precautions to prevent getting COVID-19 when you do interact with others. If you start feeling sick and think you may have COVID-19, get in touch with your healthcare provider within 24 hours.  Follow-Up: Your physician recommends that you schedule a follow-up appointment in: 1 Month with Dr Stanford Breed   At [facility], you and your health needs are our priority.  As part of our continuing mission to provide you with exceptional heart care, we have created designated Provider Care Teams.  These Care Teams include your primary Cardiologist (physician) and Advanced Practice Providers (APPs -  Physician Assistants and Nurse  Practitioners) who all work together to provide you with the care you need, when you need it.  Thank you for choosing [facility] at [facility]!!     Happy Holidays!!

## 2019-10-26 NOTE — Therapy (Signed)
[facility] [address], [place], [place], [address] Phone: [phone]   Fax:  [phone]  Physical Therapy Treatment  Patient Details  Name: Cory Barnett MRN: [id number] Date of Birth: [birth year] Referring Provider (PT): Dr. Tania Ade   Encounter Date: 10/26/2019  PT End of Session - 10/26/19 0843    Visit Number  24    Date for PT Re-Evaluation  11/07/19    Authorization Type  Medicare KX now secondary to previous PT this year;  20th visit progress note    PT Start Time  0801    PT Stop Time  0850    PT Time Calculation (min)  49 min    Activity Tolerance  Patient tolerated treatment well       Past Medical History:  Diagnosis Date  . Anxiety   . AV block, 1st degree   . Basal cell carcinoma   . Cardiac murmur   . Cataract   . Elevated PSA measurement 2018  . Hyperlipidemia   . Hypertension 2015  . 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 IFG (impaired fasting glucose) 01/2016  . Pre-diabetes   . RBBB (right bundle branch block)   . Rheumatic fever    age 53 or 58, in hospital for a month, out of school for a yr  . Seasonal allergies   . Squamous cell carcinoma     Past Surgical History:  Procedure Laterality Date  . NASAL SEPTUM SURGERY    . NOSE SURGERY    . SHOULDER ARTHROSCOPY WITH SUBACROMIAL DECOMPRESSION Right 08/07/2019   Procedure: SHOULDER ARTHROSCOPY WITH SUBACROMIAL DECOMPRESSION;  Surgeon: Tania Ade, MD;  Location: [place];  Service: Orthopedics;  Laterality: Right;  . SKIN CANCER EXCISION     basal and squamous cell   . TONSILLECTOMY    . VASECTOMY    . WISDOM TOOTH EXTRACTION      There were no vitals filed for this visit.  Subjective Assessment - 10/26/19 0803    Subjective  I worked 2 days this week.  Been an ok week pain wise.    Limitations  Walking;House hold activities;Other (comment)    Currently in Pain?  No/denies    Pain Score  0-No pain     Pain Location  Shoulder    Pain Orientation  Right    Multiple Pain Sites  No    Pain Score  0    Pain Location  Hip    Pain Orientation  Left                       [facility] Adult PT Treatment/Exercise - 10/26/19 0001      Knee/Hip Exercises: Stretches   Hip Flexor Stretch  Right;Left;5 reps    Hip Flexor Stretch Limitations  on 2nd step with left arm elevation and side bend       Knee/Hip Exercises: Standing   Other Standing Knee Exercises  ball isometrics against wall hip abduction     Other Standing Knee Exercises  seated ball squeezes       Shoulder Exercises: Prone   Flexion  Right;20 reps    Flexion Weight (lbs)  1    Extension  Strengthening;Right;20 reps    Extension Weight (lbs)  3    Horizontal ABduction 2 Limitations  scaption 1# 15x     Other Prone Exercises  pendulums 2 min with 3#  Shoulder Exercises: Standing   Other Standing Exercises  biceps 3# 30x    Other Standing Exercises  green band triceps extension 25x       Shoulder Exercises: ROM/Strengthening   UBE (Upper Arm Bike)  Level 2 x 6 (3/3)    Pushups  15 reps    Pushups Limitations  on countertop  15x    Other ROM/Strengthening Exercises     Other ROM/Strengthening Exercises  wall push ups but difficult to hold hand on wall       Vasopneumatic   Number Minutes Vasopneumatic   10 minutes    Vasopnuematic Location   Shoulder   right, seated in chair    Vasopneumatic Pressure  Low    Vasopneumatic Temperature   3 snow flakes               PT Short Term Goals - 09/26/19 1701      PT SHORT TERM GOAL #1   Title  independent with initial HEP    Status  Achieved      PT SHORT TERM GOAL #2   Title  The patient will report a 30% improvement in shoulder pain with grooming/dressing tasks    Status  Achieved      PT SHORT TERM GOAL #3   Title  Right shoulder active or active assisted elevation to 80  degrees needed for home and work ADLs in sitting    Status  Achieved         PT Long Term Goals - 10/18/19 0805      PT LONG TERM GOAL #2   Title  The patient will have improved right shoulder elevation to 120 degrees needed for reaching eye level shelves and personal care    Baseline  110    Time  6    Period  Weeks    Status  On-going            Plan - 10/26/19 0844    Clinical Impression Statement  The patient demonstrates good carryover with HEP for shoulder and  hip management.  We discussed strengthening supportive muscles within pain limitations.  Modified wall push ups to counter top push ups secondary to  lack of motor control to keep his hand placed on the wall.  Progress with shoulder overhead ROM has plateued at this point due to rotator cuff deficiency.   His hip continues to do well as he takes walks regularly and is generally sleeping well.  Anticipate readiness for discharge from PT at next visit to an independent HEP.    Rehab Potential  Good    PT Frequency  2x / week    PT Duration  6 weeks    PT Treatment/Interventions  ADLs/Self Care Home Management;Electrical Stimulation;Cryotherapy;Moist Heat;Therapeutic activities;Therapeutic exercise;Neuromuscular re-education;Manual techniques;Patient/family education;Taping;Vasopneumatic Device;Dry needling;Scar mobilization;Iontophoresis 4mg /ml Dexamethasone    PT Next Visit Plan  KX,  final review of HEP;  check progress toward goals, ROM, MMT;  plan for discharge next visit    PT Home Exercise Plan  Access Code: [email or web address]       Patient will benefit from skilled therapeutic intervention in order to improve the following deficits and impairments:  Decreased range of motion, Impaired UE functional use, Decreased activity tolerance, Impaired perceived functional ability, Pain, Decreased strength  Visit Diagnosis: Muscle weakness (generalized)  Acute pain of right shoulder  Stiffness of right shoulder, not elsewhere classified  Pain in left hip  Localized edema     Problem  List  Patient Active Problem List   Diagnosis Date Noted  . Piriformis syndrome of left side 08/18/2019  . Rotator cuff tear, right 05/29/2019  . Closed fracture of distal clavicle 05/15/2019  . 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   [name], PT 10/26/19 12:55 PM Phone: [phone] Fax: [phone] [name] 10/26/2019, 12:54 PM  [facility] [address] [place], [place], [address] Phone: [phone]   Fax:  [phone]  Name: HAMP KALKOWSKI MRN: [id number] Date of Birth: 09/18/1946

## 2019-10-30 ENCOUNTER — Other Ambulatory Visit: Payer: Self-pay | Admitting: *Deleted

## 2019-10-30 DIAGNOSIS — Z9189 Other specified personal risk factors, not elsewhere classified: Secondary | ICD-10-CM

## 2019-10-30 DIAGNOSIS — R079 Chest pain, unspecified: Secondary | ICD-10-CM

## 2019-10-30 DIAGNOSIS — R0602 Shortness of breath: Secondary | ICD-10-CM

## 2019-10-30 DIAGNOSIS — R072 Precordial pain: Secondary | ICD-10-CM

## 2019-10-30 MED ORDER — METOPROLOL TARTRATE 100 MG PO TABS: 100.0000 mg | ORAL_TABLET | Freq: Once | ORAL | 0 refills | Status: DC

## 2019-10-30 NOTE — Progress Notes (Signed)
Your cardiac CT will be scheduled at one of the below locations:   [facility] [address] [place], [place] [address] (336) [place] [address] [place], [place] [address] [phone]  If scheduled at [facility], please arrive at the [facility] main entrance of [facility] 30-45 minutes prior to test start time. Proceed to the [facility] Radiology Department (first floor) to check-in and test prep.  If scheduled at [facility], please arrive 15 mins early for check-in and test prep.  Please follow these instructions carefully (unless otherwise directed):  Hold all erectile dysfunction medications at least 3 days (72 hrs) prior to test.  On the Night Before the Test: . Be sure to Drink plenty of water. . Do not consume any caffeinated/decaffeinated beverages or chocolate 12 hours prior to your test. . Do not take any antihistamines 12 hours prior to your test.   On the Day of the Test: . Drink plenty of water. Do not drink any water within one hour of the test. . Do not eat any food 4 hours prior to the test. . You may take your regular medications prior to the test.  . Take metoprolol (Lopressor) two hours prior to test. . HOLD Furosemide/Hydrochlorothiazide morning of the test.       After the Test: . Drink plenty of water. . After receiving IV contrast, you may experience a mild flushed feeling. This is normal. . On occasion, you may experience a mild rash up to 24 hours after the test. This is not dangerous. If this occurs, you can take Benadryl 25 mg and increase your fluid intake. . If you experience trouble breathing, this can be serious. If it is severe call 911 IMMEDIATELY. If it is mild, please call our office. . If you take any of these medications: Glipizide/Metformin, Avandament, Glucavance, please do not take 48 hours after completing test  unless otherwise instructed.   Once we have confirmed authorization from your insurance company, we will call you to set up a date and time for your test.   For non-scheduling related questions, please contact the cardiac imaging nurse navigator should you have any questions/concerns: Marchia Bond, RN Navigator Cardiac Imaging [facility] [phone] Office    Medication Instructions:  TAKE- Metoprolol Tartrate 100 mg by mouth 2 hours prior to your CTA procedure  *If you need a refill on your cardiac medications before your next appointment, please call your pharmacy*  Lab Work: BMP 1 week prior to your CTA procedure  If you have labs (blood work) drawn today and your tests are completely normal, you will receive your results only by: Marland Kitchen MyChart Message (if you have MyChart) OR . A paper copy in the mail If you have any lab test that is abnormal or we need to change your treatment, we will call you to review the results.  Testing/Procedures: Non-Cardiac CT Angiography (CTA), is a special type of CT scan that uses a computer to produce multi-dimensional views of major blood vessels throughout the body. In CT angiography, a contrast material is injected through an IV to help visualize the blood vessels   Follow-Up: At [facility], you and your health needs are our priority.  As part of our continuing mission to provide you with exceptional heart care, we have created designated Provider Care Teams.  These Care Teams include your primary Cardiologist (physician)  and Advanced Practice Providers (APPs -  Physician Assistants and Nurse Practitioners) who all work together to provide you with the care you need, when you need it.  Your next appointment:   Keep appointment with Dr Stanford Breed Thursday January 21st @ 10:20 am

## 2019-10-31 ENCOUNTER — Other Ambulatory Visit: Payer: Self-pay

## 2019-10-31 ENCOUNTER — Ambulatory Visit: Payer: Medicare Other

## 2019-10-31 DIAGNOSIS — M25511 Pain in right shoulder: Secondary | ICD-10-CM

## 2019-10-31 DIAGNOSIS — M6281 Muscle weakness (generalized): Secondary | ICD-10-CM

## 2019-10-31 DIAGNOSIS — R600 Localized edema: Secondary | ICD-10-CM

## 2019-10-31 DIAGNOSIS — M25552 Pain in left hip: Secondary | ICD-10-CM

## 2019-10-31 DIAGNOSIS — M25611 Stiffness of right shoulder, not elsewhere classified: Secondary | ICD-10-CM

## 2019-10-31 NOTE — Therapy (Addendum)
[facility] [address], [place], [place], [address] Phone: [phone]   Fax:  [phone]  Physical Therapy Treatment  Patient Details  Name: Cory Barnett MRN: [id number] Date of Birth: 1946/02/26 Referring Provider (PT): Dr. Tania Ade   Encounter Date: 10/31/2019  PT End of Session - 10/31/19 0842    Visit Number  25    PT Start Time  0803    PT Stop Time  0851    PT Time Calculation (min)  48 min    Activity Tolerance  Patient tolerated treatment well    Behavior During Therapy  [facility] for tasks assessed/performed       Past Medical History:  Diagnosis Date  . Anxiety   . AV block, 1st degree   . Basal cell carcinoma   . Cardiac murmur   . Cataract   . Elevated PSA measurement 2018  . Hyperlipidemia   . Hypertension 2015  . 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 IFG (impaired fasting glucose) 01/2016  . Pre-diabetes   . RBBB (right bundle branch block)   . Rheumatic fever    age 40 or 69, in hospital for a month, out of school for a yr  . Seasonal allergies   . Squamous cell carcinoma     Past Surgical History:  Procedure Laterality Date  . NASAL SEPTUM SURGERY    . NOSE SURGERY    . SHOULDER ARTHROSCOPY WITH SUBACROMIAL DECOMPRESSION Right 08/07/2019   Procedure: SHOULDER ARTHROSCOPY WITH SUBACROMIAL DECOMPRESSION;  Surgeon: Tania Ade, MD;  Location: [place];  Service: Orthopedics;  Laterality: Right;  . SKIN CANCER EXCISION     basal and squamous cell   . TONSILLECTOMY    . VASECTOMY    . WISDOM TOOTH EXTRACTION      There were no vitals filed for this visit.  Subjective Assessment - 10/31/19 0806    Subjective  My shoulder has been doing pretty good.  I think I am going to do surgery once COVID slows down.    Patient Stated Goals  improve strength, function and ROM    Currently in Pain?  No/denies         [facility] PT Assessment - 10/31/19 0001      Assessment   Medical Diagnosis  s/p right shoulder arthroscopy    Referring Provider (PT)  Dr. Tania Ade    Onset Date/Surgical Date  08/07/19    Hand Dominance  Right    Next MD Visit  11/01/2019      Prior Function   Level of Independence  Independent      AROM   Right Shoulder Flexion  100 Degrees   scapular elevation   Right Shoulder ABduction  71 Degrees    Right Shoulder Internal Rotation  --   full     Strength   Right Shoulder Flexion  3+/5    Right Shoulder ABduction  3-/5    Right Shoulder Internal Rotation  4+/5    Right Shoulder External Rotation  3+/5                   [facility] Adult PT Treatment/Exercise - 10/31/19 0001      Shoulder Exercises: Prone   Flexion  Right;20 reps    Flexion Weight (lbs)  1    Extension  Strengthening;Right;20 reps    Extension Weight (lbs)  3    Horizontal ABduction 1  AROM;Right;20 reps  Horizontal ABduction 1 Weight (lbs)  1    Horizontal ABduction 1 Limitations  partial range    Horizontal ABduction 2 Limitations  scaption 1# 15x     Other Prone Exercises  pendulums 2 min with 3#       Shoulder Exercises: ROM/Strengthening   UBE (Upper Arm Bike)  Level 2 x 6 (3/3)      Vasopneumatic   Number Minutes Vasopneumatic   10 minutes    Vasopnuematic Location   Shoulder   right, seated in chair    Vasopneumatic Pressure  Low    Vasopneumatic Temperature   3 snow flakes               PT Short Term Goals - 09/26/19 1701      PT SHORT TERM GOAL #1   Title  independent with initial HEP    Status  Achieved      PT SHORT TERM GOAL #2   Title  The patient will report a 30% improvement in shoulder pain with grooming/dressing tasks    Status  Achieved      PT SHORT TERM GOAL #3   Title  Right shoulder active or active assisted elevation to 80  degrees needed for home and work ADLs in sitting    Status  Achieved        PT Long Term Goals - 10/31/19 0809      PT LONG TERM GOAL #1   Title  The patient will be  independent in safe self progression of HEP  for further improvements in ROM and strength    Status  Achieved      PT LONG TERM GOAL #2   Title  The patient will have improved right shoulder elevation to 120 degrees needed for reaching eye level shelves and personal care    Baseline  100    Status  Partially Met      PT LONG TERM GOAL #3   Title  The patient will have improved shoulder pain with  usual ADLs by at least 80%    Baseline  using 60% of normal.  Pain and challenge with overhead use    Time  6    Period  Weeks    Status  On-going      PT LONG TERM GOAL #4   Title  right shoulder abduction >/= 100 degrees to reach out for an item    Baseline  71    Status  Partially Met      PT LONG TERM GOAL #5   Title  The patient will have grossly 3+/5 to 4-/5 strength in deltoids and scapula stabilizers needed for light lifting needed at home and work    Status  Partially Met      PT LONG TERM GOAL #6   Title  The patient will report a 60% reduction in left hip pain with rising sit to stand, walking > 2 blocks    Status  Achieved      PT LONG TERM GOAL #7   Title  The patient will have grossly 4/5 to 4+/5 left hip strength and single leg stand > 6 sec    Status  Achieved            Plan - 10/31/19 0828    Clinical Impression Statement  Pt will discharge to HEP today.  Pt with limited Rt shoulder A/ROM and strength secondary to full thickness rotator cuff tear.  Pt reports 50% use of  the Rt UE with ADLs and self-care. Pt has HEP in place to address periscapular and accessory musculature to improve function.   Pt denies any limitation related to hip pain or weakness.  Pt will follow-up with MD tomorrow to discuss options related to Rt shoulder.    PT Next Visit Plan  D/C PT to HEP    PT Home Exercise Plan  Access Code: [email or web address]    Consulted and Agree with Plan of Care  Patient       Patient will benefit from skilled therapeutic intervention in order to improve the following  deficits and impairments:     Visit Diagnosis: Muscle weakness (generalized)  Acute pain of right shoulder  Stiffness of right shoulder, not elsewhere classified  Pain in left hip  Localized edema     Problem List Patient Active Problem List   Diagnosis Date Noted  . Piriformis syndrome of left side 08/18/2019  . Rotator cuff tear, right 05/29/2019  . Closed fracture of distal clavicle 05/15/2019  . 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    PHYSICAL THERAPY DISCHARGE SUMMARY  Visits from Start of Care: 25  Current functional level related to goals / functional outcomes: See above.  Pt has HEP in place to address strength deficits.   Remaining deficits: Functional strength deficits due to rotator cuff tear.  Pt will continue with HEP and follow-up with MD to discuss options.     Education / Equipment: HEP Plan: Patient agrees to discharge.  Patient goals were partially met. Patient is being discharged due to being pleased with the current functional level.  ?????         Sigurd Sos, PT 10/31/19 9:53 AM  [facility] [address], [place], [place], [address] Phone: [phone]   Fax:  [phone]  Name: Cory Barnett MRN: [id number] Date of Birth: 1946-03-13

## 2019-11-06 ENCOUNTER — Telehealth (HOSPITAL_COMMUNITY): Payer: Self-pay | Admitting: Emergency Medicine

## 2019-11-06 NOTE — Telephone Encounter (Signed)
Left message on voicemail with name and callback number [name] RN Navigator Cardiac Imaging [facility] [phone] Office [phone] Cell  

## 2019-11-06 NOTE — Telephone Encounter (Signed)
Reaching out to patient to offer assistance regarding upcoming cardiac imaging study; pt verbalizes understanding of appt date/time, parking situation and where to check in, pre-test NPO status and medications ordered, and verified current allergies; name and call back number provided for further questions should they arise Marchia Bond RN Navigator Cardiac Imaging [facility] [phone] office [phone] cell   pt reports extreme claustrophobia, has antianxiety medication and ride available for test.  Pt also reports shoulder mobility issues d/t rotator cuff

## 2019-11-07 ENCOUNTER — Ambulatory Visit (HOSPITAL_COMMUNITY)
Admission: RE | Admit: 2019-11-07 | Discharge: 2019-11-07 | Disposition: A | Discharge status: Home / Self Care | Payer: Medicare Other | Source: Ambulatory Visit | Attending: Adult Health | Admitting: Adult Health

## 2019-11-07 ENCOUNTER — Other Ambulatory Visit: Payer: Self-pay

## 2019-11-07 DIAGNOSIS — R072 Precordial pain: Secondary | ICD-10-CM | POA: Diagnosis not present

## 2019-11-07 DIAGNOSIS — R0602 Shortness of breath: Secondary | ICD-10-CM | POA: Diagnosis not present

## 2019-11-07 LAB — BASIC METABOLIC PANEL
BUN/Creatinine Ratio: 20 (ref 10–24)
BUN: 19 mg/dL (ref 8–27)
CO2: 26 mmol/L (ref 20–29)
Calcium: 9.7 mg/dL (ref 8.6–10.2)
Chloride: 102 mmol/L (ref 96–106)
Creatinine, Ser: 0.94 mg/dL (ref 0.76–1.27)
GFR calc Af Amer: 92 mL/min/{1.73_m2} (ref >59)
GFR calc non Af Amer: 80 mL/min/{1.73_m2} (ref >59)
Glucose: 83 mg/dL (ref 65–99)
Potassium: 4.6 mmol/L (ref 3.5–5.2)
Sodium: 140 mmol/L (ref 134–144)

## 2019-11-07 MED ORDER — NITROGLYCERIN 0.4 MG SL SUBL
SUBLINGUAL_TABLET | SUBLINGUAL | Status: AC
  Filled 2019-11-07: qty 2

## 2019-11-07 MED ORDER — NITROGLYCERIN 0.4 MG SL SUBL
0.8000 mg | SUBLINGUAL_TABLET | Freq: Once | SUBLINGUAL | Status: AC
  Administered 2019-11-07: 0.8 mg via SUBLINGUAL

## 2019-11-11 ENCOUNTER — Other Ambulatory Visit (HOSPITAL_COMMUNITY): Payer: Medicare Other

## 2019-11-14 ENCOUNTER — Telehealth: Payer: Self-pay | Admitting: Cardiology

## 2019-11-14 DIAGNOSIS — R21 Rash and other nonspecific skin eruption: Secondary | ICD-10-CM | POA: Diagnosis not present

## 2019-11-14 NOTE — Telephone Encounter (Signed)
  Patient wants to know if he still needs to have the echo done since his CT came back good. If he does not need it he wants to cancel the test. He is scheduled for tomorrow 11/15/19 at 9:15 am. Please let him know either way.

## 2019-11-14 NOTE — Telephone Encounter (Signed)
LMTCB

## 2019-11-15 ENCOUNTER — Other Ambulatory Visit: Payer: Self-pay

## 2019-11-15 ENCOUNTER — Other Ambulatory Visit (HOSPITAL_COMMUNITY): Payer: Medicare Other

## 2019-11-15 ENCOUNTER — Encounter (HOSPITAL_COMMUNITY): Payer: Medicare Other

## 2019-11-15 NOTE — Telephone Encounter (Signed)
Call and spoke with pt this morning to let him know as per Jory Sims, DNP he still need to get Echo done, pt voice understanding and thanks for the call back

## 2019-11-17 NOTE — Progress Notes (Signed)
HPI: FUpalpitations and CP.Abd ultrasound 8/17 showed no AAA. Previous descriptionof palpitationsfelt c/w PACs or PVCs.  Seen recently with atypical chest pain.  Cardiac CTA January 2021 showed mild disease in the proximal LAD at 25 to 49%; calcium score 148.  Echocardiogram January 2021 showed normal LV function, moderate left atrial enlargement, mild tricuspid regurgitation, very mild aortic stenosis with mean gradient 9 mmHg and trace aortic insufficiency.  Since last seen,he denies dyspnea on exertion, palpitations or syncope.  He occasionally feels a chest heaviness not related to activities.  There is no exertional chest pain.  Current Outpatient Medications  Medication Sig Dispense Refill  . Coenzyme Q10 (CO Q 10) 100 MG CAPS Take 100 mg by mouth every morning.    . escitalopram (LEXAPRO) 10 MG tablet Take 10 mg by mouth daily.     . Loratadine (CLARITIN) 10 MG CAPS Take 10 mg by mouth as needed.     Marland Kitchen LORazepam (ATIVAN) 1 MG tablet Take 1 mg by mouth every 8 (eight) hours as needed for anxiety.    Marland Kitchen losartan (COZAAR) 100 MG tablet Take 1 tablet (100 mg total) by mouth daily. 90 tablet 3  . Multiple Vitamin (MULTI-VITAMINS) TABS Take 1 tablet by mouth daily.    . Omega-3 Fatty Acids (FISH OIL PO) Take 2 g by mouth daily.    . Probiotic Product (PROBIOTIC ADVANCED PO) Take 1 tablet by mouth daily with breakfast.    . rosuvastatin (CRESTOR) 10 MG tablet Take 10 mg by mouth daily.    . Saw Palmetto 160 MG CAPS Take 1,500 mg by mouth daily.    . tadalafil (CIALIS) 10 MG tablet Take 1 tablet (10 mg total) by mouth daily as needed for erectile dysfunction. 10 tablet 0  . metoprolol tartrate (LOPRESSOR) 100 MG tablet Take 1 tablet (100 mg total) by mouth once for 1 dose. Take 2 hours prior to test 1 tablet 0   No current facility-administered medications for this visit.     Past Medical History:  Diagnosis Date  . Anxiety   . AV block, 1st degree   . Basal cell carcinoma   .  Cardiac murmur   . Cataract   . Elevated PSA measurement 2018  . Hyperlipidemia   . Hypertension 2015  . 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 IFG (impaired fasting glucose) 01/2016  . Pre-diabetes   . RBBB (right bundle branch block)   . Rheumatic fever    age 70 or 47, in hospital for a month, out of school for a yr  . Seasonal allergies   . Squamous cell carcinoma     Past Surgical History:  Procedure Laterality Date  . NASAL SEPTUM SURGERY    . NOSE SURGERY    . SHOULDER ARTHROSCOPY WITH SUBACROMIAL DECOMPRESSION Right 08/07/2019   Procedure: SHOULDER ARTHROSCOPY WITH SUBACROMIAL DECOMPRESSION;  Surgeon: Tania Ade, MD;  Location: [facility];  Service: Orthopedics;  Laterality: Right;  . SKIN CANCER EXCISION     basal and squamous cell   . TONSILLECTOMY    . VASECTOMY    . WISDOM TOOTH EXTRACTION      Social History   Socioeconomic History  . Marital status: Married    Spouse name: Not on file  . Number of children: 3  . Years of education: Not on file  . Highest education level: Not on file  Occupational History    Comment: Retired  Tobacco Use  .  Smoking status: Former Research scientist (life sciences)  . Smokeless tobacco: Never Used  Substance and Sexual Activity  . Alcohol use: Yes    Alcohol/week: 2.0 standard drinks    Types: 2 Cans of beer per week    Comment: social  . Drug use: Never  . Sexual activity: Not on file  Other Topics Concern  . Not on file  Social History Narrative  . Not on file   Social Determinants of Health   Financial Resource Strain:   . Difficulty of Paying Living Expenses: Not on file  Food Insecurity:   . Worried About Charity fundraiser in the Last Year: Not on file  . Ran Out of Food in the Last Year: Not on file  Transportation Needs:   . Lack of Transportation (Medical): Not on file  . Lack of Transportation (Non-Medical): Not on file  Physical Activity:   . Days of Exercise per Week: Not on file  . Minutes of Exercise per  Session: Not on file  Stress:   . Feeling of Stress : Not on file  Social Connections:   . Frequency of Communication with Friends and Family: Not on file  . Frequency of Social Gatherings with Friends and Family: Not on file  . Attends Religious Services: Not on file  . Active Member of Clubs or Organizations: Not on file  . Attends Archivist Meetings: Not on file  . Marital Status: Not on file  Intimate Partner Violence:   . Fear of Current or Ex-Partner: Not on file  . Emotionally Abused: Not on file  . Physically Abused: Not on file  . Sexually Abused: Not on file    Family History  Problem Relation Age of Onset  . Stroke Mother   . Arthritis Mother   . Cancer Mother        unknown   . Dementia Mother   . Arthritis Father   . Mesothelioma Father   . Arthritis Sister   . Arthritis Sister   . Bipolar disorder Sister     ROS: no fevers or chills, productive cough, hemoptysis, dysphasia, odynophagia, melena, hematochezia, dysuria, hematuria, rash, seizure activity, orthopnea, PND, pedal edema, claudication. Remaining systems are negative.  Physical Exam: Well-developed well-nourished in no acute distress.  Skin is warm and dry.  HEENT is normal.  Neck is supple.  Chest is clear to auscultation with normal expansion.  Cardiovascular exam is regular rate and rhythm.  2/6 systolic murmur left sternal border.  S2 is not diminished. Abdominal exam nontender or distended. No masses palpated. Extremities show no edema. 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  Plan medical therapy; add aspirin 81 mg daily.  Continue statin.  2 palpitations-symptoms are well controlled.    3 hypertension-patient's blood pressure is controlled.  He does state that it has been running high at home but he has been on prednisone.  I have asked him to follow this and we will advance regimen as needed.  Check bmet when he returns for  lab work in 12 weeks.  4 hyperlipidemia-given 25 to 49% LAD on recent CTA we will increase Crestor to 40 mg daily.  Check lipids and liver in 12 weeks.  Kirk Ruths, MD

## 2019-11-22 ENCOUNTER — Other Ambulatory Visit: Payer: Self-pay

## 2019-11-22 ENCOUNTER — Ambulatory Visit (HOSPITAL_COMMUNITY): Payer: Medicare Other | Attending: Cardiovascular Disease

## 2019-11-22 ENCOUNTER — Ambulatory Visit: Payer: Medicare Other

## 2019-11-22 DIAGNOSIS — R5383 Other fatigue: Secondary | ICD-10-CM | POA: Insufficient documentation

## 2019-11-22 DIAGNOSIS — R0789 Other chest pain: Secondary | ICD-10-CM | POA: Insufficient documentation

## 2019-11-22 DIAGNOSIS — Z23 Encounter for immunization: Secondary | ICD-10-CM

## 2019-11-22 NOTE — Progress Notes (Signed)
   Covid-19 Vaccination Clinic  Name:  Cory Barnett    MRN: [id number] DOB: September 27, 1946  11/22/2019  Mr. Cory Barnett was observed post Covid-19 immunization for 15 minutes without incidence. He was provided with Vaccine Information Sheet and instruction to access the V-Safe system.   Mr. Cory Barnett was instructed to call 911 with any severe reactions post vaccine: Marland Kitchen Difficulty breathing  . Swelling of your face and throat  . A fast heartbeat  . A bad rash all over your body  . Dizziness and weakness    Immunizations Administered    Name Date Dose VIS Date Route   Pfizer COVID-19 Vaccine 11/22/2019  6:02 PM 0.3 mL 10/13/2019 Intramuscular   Manufacturer: [place]   Lot: [id number]   [place]: [id number]

## 2019-11-23 ENCOUNTER — Ambulatory Visit (INDEPENDENT_AMBULATORY_CARE_PROVIDER_SITE_OTHER): Payer: Medicare Other | Admitting: Cardiology

## 2019-11-23 ENCOUNTER — Encounter: Payer: Self-pay | Admitting: Cardiology

## 2019-11-23 VITALS — BP 126/88 | HR 71 | Ht 74.0 in | Wt 214.0 lb

## 2019-11-23 DIAGNOSIS — I2510 Atherosclerotic heart disease of native coronary artery without angina pectoris: Secondary | ICD-10-CM

## 2019-11-23 DIAGNOSIS — E7800 Pure hypercholesterolemia, unspecified: Secondary | ICD-10-CM

## 2019-11-23 DIAGNOSIS — I10 Essential (primary) hypertension: Secondary | ICD-10-CM

## 2019-11-23 MED ORDER — ROSUVASTATIN CALCIUM 40 MG PO TABS: 40.0000 mg | ORAL_TABLET | Freq: Every day | ORAL | 3 refills | Status: DC

## 2019-11-23 NOTE — Patient Instructions (Addendum)
Medication Instructions:  START TAKING ASPIRIN 81 MG DAILY= 1 TABLET DAILY INCREASE CRESTOR TO 40MG  DAILY= 1 TABLET DAILY *If you need a refill on your cardiac medications before your next appointment, please call your pharmacy*  Lab Work: LIPIDS, BMET, AND LIVER- DO NOT EAT BEFORE THESE LABS ARE DRAWN If you have labs (blood work) drawn today and your tests are completely normal, you will receive your results only by: Marland Kitchen MyChart Message (if you have MyChart) OR . A paper copy in the mail If you have any lab test that is abnormal or we need to change your treatment, we will call you to review the results.  Follow-Up: At [facility], you and your health needs are our priority.  As part of our continuing mission to provide you with exceptional heart care, we have created designated Provider Care Teams.  These Care Teams include your primary Cardiologist (physician) and Advanced Practice Providers (APPs -  Physician Assistants and Nurse Practitioners) who all work together to provide you with the care you need, when you need it.  Your next appointment:   12 month(s)  The format for your next appointment:   In Person  Provider:   Kirk Ruths, MD

## 2019-11-28 ENCOUNTER — Ambulatory Visit (HOSPITAL_COMMUNITY): Payer: Medicare Other

## 2019-12-01 ENCOUNTER — Other Ambulatory Visit: Payer: Self-pay | Admitting: Orthopedic Surgery

## 2019-12-01 ENCOUNTER — Telehealth: Payer: Self-pay

## 2019-12-01 NOTE — Telephone Encounter (Signed)
   [facility] Pre-operative Risk Assessment    Request for surgical clearance:  1. What type of surgery is being performed? RIGHT REVERSE TOTAL SHOULDER ARTHROPLASTY   2. When is this surgery scheduled? 01/11/2020   3. What type of clearance is required (medical clearance vs. Pharmacy clearance to hold med vs. Both)? BOTH  4. Are there any medications that need to be held prior to surgery and how long? NOT LISTED   5. Practice name and name of physician performing surgery? [facility]; Tania Ade, MD   6. What is your office phone number [phone]    7.   What is your office fax number [phone] ATTN: STEPHANIE GRIFFIN  8.   Anesthesia type (None, local, MAC, general) ? CHOICE ANESTHESIA    Cory Barnett Cory Barnett 12/01/2019, 4:37 PM  _________________________________________________________________   (provider comments below)

## 2019-12-04 NOTE — Telephone Encounter (Signed)
   Primary Cardiologist: Kirk Ruths, MD  Chart reviewed as part of pre-operative protocol coverage. 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 Echo on 11/22/2019 showed LVEF of 60-65% with normal wall motion and no significant valvular disease. He was recently seen by Dr. Stanford Breed on 11/23/2019 at which time he reported occasional chest heaviness not related to activities but no exertional symptoms. I called patient today and he reported no changes since recent visit.  Given past medical history and time since last visit, based on ACC/AHA guidelines, Cory Barnett would be at acceptable risk for the planned procedure without further cardiovascular testing.   Regarding ASA therapy, we often recommend continuation of ASA throughout the perioperative period.  However, if the surgeon feels that cessation of ASA is required in the perioperative period, it may be stopped 5-7 days prior to surgery with a plan to resume it as soon as felt to be feasible from a surgical standpoint in the post-operative period.  I will route this recommendation to the requesting party via Epic fax function and remove from pre-op pool.  Please call with questions.  Darreld Mclean, PA-C 12/04/2019, 10:11 AM

## 2019-12-05 ENCOUNTER — Ambulatory Visit: Payer: Medicare Other

## 2019-12-13 ENCOUNTER — Ambulatory Visit: Payer: Medicare Other | Attending: Internal Medicine

## 2019-12-13 DIAGNOSIS — Z23 Encounter for immunization: Secondary | ICD-10-CM | POA: Insufficient documentation

## 2019-12-13 NOTE — Progress Notes (Signed)
   Covid-19 Vaccination Clinic  Name:  Cory Barnett    MRN: [id number] DOB: 27-Dec-1945  12/13/2019   Cory Barnett was observed post Covid-19 immunization for 15 minutes without incidence. He was provided with Vaccine Information Sheet and instruction to access the V-Safe system.   Cory Barnett was instructed to call 911 with any severe reactions post vaccine: Marland Kitchen Difficulty breathing  . Swelling of your face and throat  . A fast heartbeat  . A bad rash all over your body  . Dizziness and weakness    Immunizations Administered    Name Date Dose VIS Date Route   Pfizer COVID-19 Vaccine 12/13/2019  5:26 PM 0.3 mL 10/13/2019 Intramuscular   Manufacturer: [place]   Lot: [id number]   [place]: [id number]

## 2019-12-27 DIAGNOSIS — J309 Allergic rhinitis, unspecified: Secondary | ICD-10-CM | POA: Diagnosis not present

## 2019-12-27 DIAGNOSIS — L501 Idiopathic urticaria: Secondary | ICD-10-CM | POA: Diagnosis not present

## 2019-12-29 DIAGNOSIS — M75121 Complete rotator cuff tear or rupture of right shoulder, not specified as traumatic: Secondary | ICD-10-CM | POA: Diagnosis not present

## 2020-01-02 ENCOUNTER — Other Ambulatory Visit (HOSPITAL_COMMUNITY): Payer: Self-pay | Admitting: *Deleted

## 2020-01-02 NOTE — Patient Instructions (Addendum)
DUE TO COVID-19 ONLY ONE VISITOR IS ALLOWED TO COME WITH YOU AND STAY IN THE WAITING ROOM ONLY DURING PRE OP AND PROCEDURE DAY OF SURGERY. THE 1 VISITOR MAY VISIT WITH YOU AFTER SURGERY IN YOUR PRIVATE ROOM DURING VISITING HOURS ONLY!  YOU NEED TO HAVE A COVID 19 TEST ON_Monday 03/08/2021______ @_  10:45 am______, THIS TEST MUST BE DONE BEFORE SURGERY, COME  Cory Barnett, Cory Barnett , [id number].  (Cory Barnett) ONCE YOUR COVID TEST IS COMPLETED, PLEASE BEGIN THE QUARANTINE INSTRUCTIONS AS OUTLINED IN YOUR HANDOUT.                Cory Barnett     Your procedure is scheduled on: Thursday 01/11/2020   Report to [facility] Main  Entrance    Report to Short Stay at   [place] AM     Call this number if you have problems the morning of surgery [phone]    Remember: Do not eat food :After Midnight.     NO SOLID FOOD AFTER MIDNIGHT THE NIGHT PRIOR TO SURGERY  And   NOTHING BY MOUTH EXCEPT CLEAR LIQUIDS UNTIL 0430 am .     PLEASE FINISH Gatorade G 2  DRINK PER SURGEON ORDER  WHICH NEEDS TO BE COMPLETED AT 0430 am .   CLEAR LIQUID DIET   Foods Allowed                                                                     Foods Excluded  Coffee and tea, regular and decaf                             liquids that you cannot  Plain Jell-O any favor except red or purple                                           see through such as: Fruit ices (not with fruit pulp)                                     milk, soups, orange juice  Iced Popsicles                                    All solid food Carbonated beverages, regular and diet                                    Cranberry, grape and apple juices Sports drinks like Gatorade Lightly seasoned clear broth or consume(fat free) Sugar, honey syrup  Sample Menu Breakfast                                Lunch  Supper Cranberry juice                    Beef broth                             Chicken broth Jell-O                                     Grape juice                           Apple juice Coffee or tea                        Jell-O                                      Popsicle                                                Coffee or tea                        Coffee or tea  _____________________________________________________________________     BRUSH YOUR TEETH MORNING OF SURGERY AND RINSE YOUR MOUTH OUT, NO CHEWING GUM CANDY OR MINTS.     Take these medicines the morning of surgery with A SIP OF WATER: Escitalopram (Lexapro), Rosuvastatin (Crestor)                                 You may not have any metal on your body including hair pins and              piercings  Do not wear jewelry, make-up, lotions, powders or perfumes, deodorant                          Men may shave face and neck.   Do not bring valuables to the hospital. [place].  Contacts, dentures or bridgework may not be worn into surgery.  Leave suitcase in the car. After surgery it may be brought to your room.     Patients discharged the day of surgery will not be allowed to drive home. IF YOU ARE HAVING SURGERY AND GOING HOME THE SAME DAY, YOU MUST HAVE AN ADULT TO DRIVE YOU HOME AND  BE WITH YOU FOR 24 HOURS. YOU MAY GO HOME BY TAXI OR UBER OR ORTHERWISE, BUT AN ADULT MUST ACCOMPANY YOU HOME AND STAY WITH YOU FOR 24 HOURS.  Name and phone number of your driver:spouseVaughan Barnett  [phone]                Please read over the following fact sheets you were given: _____________________________________________________________________  [facility]- Preparing for Total Shoulder Arthroplasty    Before surgery, you can play an important role. Because skin is not sterile, your skin needs to be as free of  germs as possible. You can reduce the number of germs on your skin by using the following products. . Benzoyl Peroxide Gel o Reduces the  number of germs present on the skin o Applied twice a day to shoulder area starting two days before surgery    ==================================================================  Please follow these instructions carefully:  BENZOYL PEROXIDE 5% GEL  Please do not use if you have an allergy to benzoyl peroxide.   If your skin becomes reddened/irritated stop using the benzoyl peroxide.  Starting two days before surgery, apply as follows: 1. Apply benzoyl peroxide in the morning and at night. Apply after taking a shower. If you are not taking a shower clean entire shoulder front, back, and side along with the armpit with a clean wet washcloth.  2. Place a quarter-sized dollop on your shoulder and rub in thoroughly, making sure to cover the front, back, and side of your shoulder, along with the armpit.   2 days before _x___ AM   __x__ PM              1 day before __x__ AM   __x__ PM                        3. Do this twice a day for two days.  (Last application is the night before surgery, AFTER using the CHG soap as described below).  4. Do NOT apply benzoyl peroxide gel on the day of surgery.            [place] - Preparing for Surgery Before surgery, you can play an important role.  Because skin is not sterile, your skin needs to be as free of germs as possible.  You can reduce the number of germs on your skin by washing with CHG (chlorahexidine gluconate) soap before surgery.  CHG is an antiseptic cleaner which kills germs and bonds with the skin to continue killing germs even after washing. Please DO NOT use if you have an allergy to CHG or antibacterial soaps.  If your skin becomes reddened/irritated stop using the CHG and inform your nurse when you arrive at Short Stay. Do not shave (including legs and underarms) for at least 48 hours prior to the first CHG shower.  You may shave your face/neck. Please follow these instructions carefully:  1.  Shower with CHG Soap the night before  surgery and the  morning of Surgery.  2.  If you choose to wash your hair, wash your hair first as usual with your  normal  shampoo.  3.  After you shampoo, rinse your hair and body thoroughly to remove the  shampoo.                           4.  Use CHG as you would any other liquid soap.  You can apply chg directly  to the skin and wash                       Gently with a scrungie or clean washcloth.  5.  Apply the CHG Soap to your body ONLY FROM THE NECK DOWN.   Do not use on face/ open                           Wound or open sores. Avoid contact with eyes, ears mouth and genitals (private parts).  Wash face,  Genitals (private parts) with your normal soap.             6.  Wash thoroughly, paying special attention to the area where your surgery  will be performed.  7.  Thoroughly rinse your body with warm water from the neck down.  8.  DO NOT shower/wash with your normal soap after using and rinsing off  the CHG Soap.                9.  Pat yourself dry with a clean towel.            10.  Wear clean pajamas.            11.  Place clean sheets on your bed the night of your first shower and do not  sleep with pets. Day of Surgery : Do not apply any lotions/deodorants the morning of surgery.  Please wear clean clothes to the hospital/surgery center.  FAILURE TO FOLLOW THESE INSTRUCTIONS MAY RESULT IN THE CANCELLATION OF YOUR SURGERY PATIENT SIGNATURE_________________________________  NURSE SIGNATURE__________________________________  ________________________________________________________________________   Adam Phenix  An incentive spirometer is a tool that can help keep your lungs clear and active. This tool measures how well you are filling your lungs with each breath. Taking long deep breaths may help reverse or decrease the chance of developing breathing (pulmonary) problems (especially infection) following:  A long period of time when you are unable to  move or be active. BEFORE THE PROCEDURE   If the spirometer includes an indicator to show your best effort, your nurse or respiratory therapist will set it to a desired goal.  If possible, sit up straight or lean slightly forward. Try not to slouch.  Hold the incentive spirometer in an upright position. INSTRUCTIONS FOR USE  1. Sit on the edge of your bed if possible, or sit up as far as you can in bed or on a chair. 2. Hold the incentive spirometer in an upright position. 3. Breathe out normally. 4. Place the mouthpiece in your mouth and seal your lips tightly around it. 5. Breathe in slowly and as deeply as possible, raising the piston or the ball toward the top of the column. 6. Hold your breath for 3-5 seconds or for as long as possible. Allow the piston or ball to fall to the bottom of the column. 7. Remove the mouthpiece from your mouth and breathe out normally. 8. Rest for a few seconds and repeat Steps 1 through 7 at least 10 times every 1-2 hours when you are awake. Take your time and take a few normal breaths between deep breaths. 9. The spirometer may include an indicator to show your best effort. Use the indicator as a goal to work toward during each repetition. 10. After each set of 10 deep breaths, practice coughing to be sure your lungs are clear. If you have an incision (the cut made at the time of surgery), support your incision when coughing by placing a pillow or rolled up towels firmly against it. Once you are able to get out of bed, walk around indoors and cough well. You may stop using the incentive spirometer when instructed by your caregiver.  RISKS AND COMPLICATIONS  Take your time so you do not get dizzy or light-headed.  If you are in pain, you may need to take or ask for pain medication before doing incentive spirometry. It is harder to take a deep breath if you are having pain.  AFTER USE  Rest and breathe slowly and easily.  It can be helpful to keep track of  a log of your progress. Your caregiver can provide you with a simple table to help with this. If you are using the spirometer at home, follow these instructions: [place] IF:   You are having difficultly using the spirometer.  You have trouble using the spirometer as often as instructed.  Your pain medication is not giving enough relief while using the spirometer.  You develop fever of 100.5 F (38.1 C) or higher. SEEK IMMEDIATE MEDICAL CARE IF:   You cough up bloody sputum that had not been present before.  You develop fever of 102 F (38.9 C) or greater.  You develop worsening pain at or near the incision site. MAKE SURE YOU:   Understand these instructions.  Will watch your condition.  Will get help right away if you are not doing well or get worse. Document Released: 03/01/2007 Document Revised: 01/11/2012 Document Reviewed: 05/02/2007 ExitCare Patient Information 2014 Exit[employer], [place].   ________________________________________________________________________  WHAT IS A BLOOD TRANSFUSION? Blood Transfusion Information  A transfusion is the replacement of blood or some of its parts. Blood is made up of multiple cells which provide different functions.  Red blood cells carry oxygen and are used for blood loss replacement.  White blood cells fight against infection.  Platelets control bleeding.  Plasma helps clot blood.  Other blood products are available for specialized needs, such as hemophilia or other clotting disorders. BEFORE THE TRANSFUSION  Who gives blood for transfusions?   Healthy volunteers who are fully evaluated to make sure their blood is safe. This is blood bank blood. Transfusion therapy is the safest it has ever been in the practice of medicine. Before blood is taken from a donor, a complete history is taken to make sure that person has no history of diseases nor engages in risky social behavior (examples are intravenous drug use or sexual  activity with multiple partners). The donor's travel history is screened to minimize risk of transmitting infections, such as malaria. The donated blood is tested for signs of infectious diseases, such as HIV and hepatitis. The blood is then tested to be sure it is compatible with you in order to minimize the chance of a transfusion reaction. If you or a relative donates blood, this is often done in anticipation of surgery and is not appropriate for emergency situations. It takes many days to process the donated blood. RISKS AND COMPLICATIONS Although transfusion therapy is very safe and saves many lives, the main dangers of transfusion include:   Getting an infectious disease.  Developing a transfusion reaction. This is an allergic reaction to something in the blood you were given. Every precaution is taken to prevent this. The decision to have a blood transfusion has been considered carefully by your caregiver before blood is given. Blood is not given unless the benefits outweigh the risks. AFTER THE TRANSFUSION  Right after receiving a blood transfusion, you will usually feel much better and more energetic. This is especially true if your red blood cells have gotten low (anemic). The transfusion raises the level of the red blood cells which carry oxygen, and this usually causes an energy increase.  The nurse administering the transfusion will monitor you carefully for complications. HOME CARE INSTRUCTIONS  No special instructions are needed after a transfusion. You may find your energy is better. Speak with your caregiver about any limitations on activity for underlying diseases  you may have. SEEK MEDICAL CARE IF:   Your condition is not improving after your transfusion.  You develop redness or irritation at the intravenous (IV) site. SEEK IMMEDIATE MEDICAL CARE IF:  Any of the following symptoms occur over the next 12 hours:  Shaking chills.  You have a temperature by mouth above 102 F  (38.9 C), not controlled by medicine.  Chest, back, or muscle pain.  People around you feel you are not acting correctly or are confused.  Shortness of breath or difficulty breathing.  Dizziness and fainting.  You get a rash or develop hives.  You have a decrease in urine output.  Your urine turns a dark color or changes to pink, red, or brown. Any of the following symptoms occur over the next 10 days:  You have a temperature by mouth above 102 F (38.9 C), not controlled by medicine.  Shortness of breath.  Weakness after normal activity.  The white part of the eye turns yellow (jaundice).  You have a decrease in the amount of urine or are urinating less often.  Your urine turns a dark color or changes to pink, red, or brown. Document Released: 10/16/2000 Document Revised: 01/11/2012 Document Reviewed: 06/04/2008 [facility] Patient Information 2014 [place], [place].  _______________________________________________________________________

## 2020-01-04 ENCOUNTER — Encounter (HOSPITAL_COMMUNITY)
Admission: RE | Admit: 2020-01-04 | Discharge: 2020-01-04 | Disposition: A | Discharge status: Home / Self Care | Payer: Medicare Other | Source: Ambulatory Visit | Attending: Orthopedic Surgery | Admitting: Orthopedic Surgery

## 2020-01-04 ENCOUNTER — Other Ambulatory Visit: Payer: Self-pay

## 2020-01-04 ENCOUNTER — Encounter (HOSPITAL_COMMUNITY): Payer: Self-pay

## 2020-01-04 ENCOUNTER — Other Ambulatory Visit (HOSPITAL_COMMUNITY): Payer: Medicare Other

## 2020-01-04 ENCOUNTER — Ambulatory Visit (HOSPITAL_COMMUNITY)
Admission: RE | Admit: 2020-01-04 | Discharge: 2020-01-04 | Disposition: A | Discharge status: Home / Self Care | Payer: Medicare Other | Source: Ambulatory Visit | Attending: Orthopedic Surgery | Admitting: Orthopedic Surgery

## 2020-01-04 DIAGNOSIS — Z01818 Encounter for other preprocedural examination: Secondary | ICD-10-CM | POA: Diagnosis not present

## 2020-01-04 DIAGNOSIS — Z7982 Long term (current) use of aspirin: Secondary | ICD-10-CM | POA: Diagnosis not present

## 2020-01-04 DIAGNOSIS — E785 Hyperlipidemia, unspecified: Secondary | ICD-10-CM | POA: Diagnosis not present

## 2020-01-04 DIAGNOSIS — Z01811 Encounter for preprocedural respiratory examination: Secondary | ICD-10-CM | POA: Insufficient documentation

## 2020-01-04 DIAGNOSIS — Z01812 Encounter for preprocedural laboratory examination: Secondary | ICD-10-CM | POA: Diagnosis not present

## 2020-01-04 DIAGNOSIS — Z85828 Personal history of other malignant neoplasm of skin: Secondary | ICD-10-CM | POA: Insufficient documentation

## 2020-01-04 DIAGNOSIS — E291 Testicular hypofunction: Secondary | ICD-10-CM | POA: Diagnosis not present

## 2020-01-04 DIAGNOSIS — Z79899 Other long term (current) drug therapy: Secondary | ICD-10-CM | POA: Insufficient documentation

## 2020-01-04 DIAGNOSIS — M75101 Unspecified rotator cuff tear or rupture of right shoulder, not specified as traumatic: Secondary | ICD-10-CM | POA: Diagnosis not present

## 2020-01-04 DIAGNOSIS — I10 Essential (primary) hypertension: Secondary | ICD-10-CM | POA: Diagnosis not present

## 2020-01-04 DIAGNOSIS — I2510 Atherosclerotic heart disease of native coronary artery without angina pectoris: Secondary | ICD-10-CM | POA: Diagnosis not present

## 2020-01-04 DIAGNOSIS — Z87891 Personal history of nicotine dependence: Secondary | ICD-10-CM | POA: Insufficient documentation

## 2020-01-04 DIAGNOSIS — F419 Anxiety disorder, unspecified: Secondary | ICD-10-CM | POA: Diagnosis not present

## 2020-01-04 LAB — CBC WITH DIFFERENTIAL/PLATELET
Abs Immature Granulocytes: 0.02 10*3/uL (ref 0.00–0.07)
Basophils Absolute: 0.1 10*3/uL (ref 0.0–0.1)
Basophils Relative: 1 %
Eosinophils Absolute: 0.2 10*3/uL (ref 0.0–0.5)
Eosinophils Relative: 3 %
HCT: 43 % (ref 39.0–52.0)
Hemoglobin: 13.9 g/dL (ref 13.0–17.0)
Immature Granulocytes: 0 %
Lymphocytes Relative: 30 %
Lymphs Abs: 1.6 10*3/uL (ref 0.7–4.0)
MCH: 29.5 pg (ref 26.0–34.0)
MCHC: 32.3 g/dL (ref 30.0–36.0)
MCV: 91.3 fL (ref 80.0–100.0)
Monocytes Absolute: 0.6 10*3/uL (ref 0.1–1.0)
Monocytes Relative: 11 %
Neutro Abs: 3 10*3/uL (ref 1.7–7.7)
Neutrophils Relative %: 55 %
Platelets: 188 10*3/uL (ref 150–400)
RBC: 4.71 MIL/uL (ref 4.22–5.81)
RDW: 13.8 % (ref 11.5–15.5)
WBC: 5.4 10*3/uL (ref 4.0–10.5)
nRBC: 0 % (ref 0.0–0.2)

## 2020-01-04 LAB — PROTIME-INR
INR: 1 (ref 0.8–1.2)
Prothrombin Time: 12.9 seconds (ref 11.4–15.2)

## 2020-01-04 LAB — URINALYSIS, ROUTINE W REFLEX MICROSCOPIC
Bilirubin Urine: NEGATIVE
Glucose, UA: NEGATIVE mg/dL
Hgb urine dipstick: NEGATIVE
Ketones, ur: NEGATIVE mg/dL
Leukocytes,Ua: NEGATIVE
Nitrite: NEGATIVE
Protein, ur: NEGATIVE mg/dL
Specific Gravity, Urine: 1.012 (ref 1.005–1.030)
pH: 6 (ref 5.0–8.0)

## 2020-01-04 LAB — COMPREHENSIVE METABOLIC PANEL
ALT: 26 U/L (ref 0–44)
AST: 27 U/L (ref 15–41)
Albumin: 4.4 g/dL (ref 3.5–5.0)
Alkaline Phosphatase: 62 U/L (ref 38–126)
Anion gap: 6 (ref 5–15)
BUN: 19 mg/dL (ref 8–23)
CO2: 29 mmol/L (ref 22–32)
Calcium: 9.4 mg/dL (ref 8.9–10.3)
Chloride: 107 mmol/L (ref 98–111)
Creatinine, Ser: 0.79 mg/dL (ref 0.61–1.24)
GFR calc Af Amer: >60 mL/min (ref >60)
GFR calc non Af Amer: >60 mL/min (ref >60)
Glucose, Bld: 123 mg/dL — ABNORMAL HIGH (ref 70–99)
Potassium: 4.2 mmol/L (ref 3.5–5.1)
Sodium: 142 mmol/L (ref 135–145)
Total Bilirubin: 0.5 mg/dL (ref 0.3–1.2)
Total Protein: 7.3 g/dL (ref 6.5–8.1)

## 2020-01-04 LAB — HEMOGLOBIN A1C
Hgb A1c MFr Bld: 6.1 % — ABNORMAL HIGH (ref 4.8–5.6)
Mean Plasma Glucose: 128.37 mg/dL

## 2020-01-04 LAB — SURGICAL PCR SCREEN
MRSA, PCR: NEGATIVE
Staphylococcus aureus: NEGATIVE

## 2020-01-04 LAB — APTT: aPTT: 30 seconds (ref 24–36)

## 2020-01-04 LAB — ABO/RH: ABO/RH(D): O POS

## 2020-01-04 NOTE — Progress Notes (Signed)
PCP - Dr. Elta Guadeloupe Perrini Cardiologist - Dr. Phill Mutter  LOV 11/23/2019 epic Note from 12/04/2019 on chart and in epic for clearance from [facility]  Chest x-ray - n/a EKG - 10/26/2019 epic Stress Test - 04/16/2008 epic ECHO - 11/22/2019 epic Cardiac Cath - n/a  Sleep Study - n/a CPAP - n/a  Fasting Blood Sugar - Pre-Diabetes Checks Blood Sugar __0___ times a day  Blood Thinner Instructions:n/a Aspirin Instructions:Aspirin 81 mg . Dr. Stanford Breed prescribes. Per patient has not been given instructions as when to stop Aspirin prior to surgery. Note on chart from [facility] [place] from 12/04/2019 states to stop Aspirin 5-7 days prior to surgery. Instructed patient to call Dr. Stanford Breed office and ask them about Aspirin and also herbal supplements. Patient verbalized understanding. Last Dose:01/03/2020  Anesthesia review:  Chart to be reviewed by Konrad Felix, PA  Patient has a history of HTN, rheumatic fever age 26, cardiac murmur, RBBB, and Pre-Diabetes.  Patient denies shortness of breath, fever, cough and chest pain at PAT appointment   Patient verbalized understanding of instructions that were given to them at the PAT appointment. Patient was also instructed that they will need to review over the PAT instructions again at home before surgery.

## 2020-01-08 ENCOUNTER — Other Ambulatory Visit (HOSPITAL_COMMUNITY)
Admission: RE | Admit: 2020-01-08 | Discharge: 2020-01-08 | Disposition: A | Discharge status: Home / Self Care | Payer: Medicare Other | Source: Ambulatory Visit | Attending: Orthopedic Surgery | Admitting: Orthopedic Surgery

## 2020-01-08 DIAGNOSIS — Z20822 Contact with and (suspected) exposure to covid-19: Secondary | ICD-10-CM | POA: Insufficient documentation

## 2020-01-08 DIAGNOSIS — Z01812 Encounter for preprocedural laboratory examination: Secondary | ICD-10-CM | POA: Insufficient documentation

## 2020-01-08 NOTE — Progress Notes (Signed)
Anesthesia Chart Review   Case: [id number] Date/Time: 01/11/20 0715   Procedure: [id number] TOTAL SHOULDER ARTHROPLASTY (Right Shoulder)   Anesthesia type: Choice   Pre-op diagnosis: RIGHT SHOULDER IRREPARABLE ROTATOR CUFF TEAR   Location: [facility] ROOM 07 / [facility]   Surgeons: Tania Ade, MD      DISCUSSION:74 y.o. former smoker (quit 12/14/69) with h/o HLD, HTN, non obstructive CAD, right shoulder rotator cuff tear scheduled for above procedure 01/11/2020 with Dr. Tania Ade.   Cleared by cardiology.  Per Sande Rives, PA-C, "Chart reviewed as part of pre-operative protocol coverage. 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 Echo on 11/22/2019 showed LVEF of 60-65% with normal wall motion and no significant valvular disease. He was recently seen by Dr. Stanford Breed on 11/23/2019 at which time he reported occasional chest heaviness not related to activities but no exertional symptoms. I called patient today and he reported no changes since recent visit.  Given past medical history and time since last visit, based on ACC/AHA guidelines, Cory Barnett would be at acceptable risk for the planned procedure without further cardiovascular testing.  Regarding ASA therapy, we often recommend continuation of ASA throughout the perioperative period.  However, if the surgeon feels that cessation of ASA is required in the perioperative period, it may be stopped 5-7 days prior to surgery with a plan to resume it as soon as felt to be feasible from a surgical standpoint in the post-operative period."  Anticipate pt can proceed with planned procedure barring acute status change.   VS: Ht 6\' 2"  (1.88 m)   Wt 97.5 kg   BMI 27.60 kg/m   PROVIDERS: Crist Infante, MD  Kirk Ruths, MD is Cardiologist  LABS: Labs reviewed: Acceptable for surgery. (all labs ordered are listed, but only abnormal results are displayed)  Labs Reviewed  COMPREHENSIVE  METABOLIC PANEL - Abnormal; Notable for the following components:      Result Value   Glucose, Bld 123 (*)    All other components within normal limits  HEMOGLOBIN A1C - Abnormal; Notable for the following components:   Hgb A1c MFr Bld 6.1 (*)    All other components within normal limits  SURGICAL PCR SCREEN  APTT  CBC WITH DIFFERENTIAL/PLATELET  PROTIME-INR  TYPE AND SCREEN  ABO/RH     IMAGES:   EKG: 10/26/2019 Rate 64 bpm Sinus rhythm with 1st degree AV block  Right bundle branch block   CV: Echo 11/22/19 IMPRESSIONS    1. Left ventricular ejection fraction, by visual estimation, is 60 to  65%. The left ventricle has normal function. There is no left ventricular  hypertrophy.  2. The left ventricle has no regional wall motion abnormalities.  3. Global right ventricle has normal systolic function.The right  ventricular size is normal. No increase in right ventricular wall  thickness.  4. Left atrial size was moderately dilated.  5. Right atrial size was normal.  6. The mitral valve is normal in structure. Trivial mitral valve  regurgitation. No evidence of mitral stenosis.  7. The tricuspid valve is normal in structure.  8. The tricuspid valve is normal in structure. Tricuspid valve  regurgitation is mild.  9. The aortic valve is tricuspid. Aortic valve regurgitation is trivial.  Mild aortic valve stenosis.  10. Pulmonic regurgitation is mild.  11. The pulmonic valve was grossly normal. Pulmonic valve regurgitation is  mild.  12. Mildly elevated pulmonary artery systolic pressure.  13. The inferior  vena cava is normal in size with greater than 50%  respiratory variability, suggesting right atrial pressure of 3 mmHg.  Past Medical History:  Diagnosis Date  . Anxiety   . AV block, 1st degree   . Basal cell carcinoma   . Cardiac murmur   . Cataract   . Elevated PSA measurement 2018  . Hyperlipidemia   . Hypertension 2015  . 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 IFG (impaired fasting glucose) 01/2016  . Pre-diabetes   . RBBB (right bundle branch block)   . Rheumatic fever    age 74 or 78, in hospital for a month, out of school for a yr  . Seasonal allergies   . Squamous cell carcinoma     Past Surgical History:  Procedure Laterality Date  . EYE SURGERY     right eye  cataract surgery with lens implant  . NASAL SEPTUM SURGERY    . NOSE SURGERY    . SHOULDER ARTHROSCOPY WITH SUBACROMIAL DECOMPRESSION Right 08/07/2019   Procedure: SHOULDER ARTHROSCOPY WITH SUBACROMIAL DECOMPRESSION;  Surgeon: Tania Ade, MD;  Location: [place];  Service: Orthopedics;  Laterality: Right;  . SKIN CANCER EXCISION     basal and squamous cell   . TONSILLECTOMY    . VASECTOMY    . WISDOM TOOTH EXTRACTION      MEDICATIONS: . aspirin EC 81 MG tablet  . CHELATED MAGNESIUM PO  . Coenzyme Q10 (CO Q 10) 100 MG CAPS  . escitalopram (LEXAPRO) 10 MG tablet  . ibuprofen (ADVIL) 200 MG tablet  . losartan (COZAAR) 100 MG tablet  . metoprolol tartrate (LOPRESSOR) 100 MG tablet  . Multiple Vitamin (MULTI-VITAMINS) TABS  . OVER THE COUNTER MEDICATION  . Probiotic Product (PROBIOTIC PO)  . rosuvastatin (CRESTOR) 40 MG tablet  . Saw Palmetto, Serenoa repens, (SAW PALMETTO PO)  . tadalafil (CIALIS) 10 MG tablet   No current facility-administered medications for this encounter.     Maia Plan [facility] Pre-Surgical Testing [phone] 01/08/20  3:58 PM

## 2020-01-09 LAB — SARS CORONAVIRUS 2 (TAT 6-24 HRS): SARS Coronavirus 2: NEGATIVE

## 2020-01-10 DIAGNOSIS — E7800 Pure hypercholesterolemia, unspecified: Secondary | ICD-10-CM

## 2020-01-10 MED ORDER — ROSUVASTATIN CALCIUM 10 MG PO TABS: 10.0000 mg | ORAL_TABLET | Freq: Every day | ORAL | 3 refills | Status: DC

## 2020-01-10 NOTE — Telephone Encounter (Signed)
Okay to decrease Crestor to 10 mg daily. Check lipids and liver in 12 weeks.  Kirk Ruths

## 2020-01-10 NOTE — Anesthesia Preprocedure Evaluation (Addendum)
Anesthesia Evaluation  Patient identified by MRN, date of birth, ID band Patient awake    Reviewed: Allergy & Precautions, NPO status , Patient's Chart, lab work & pertinent test results  History of Anesthesia Complications Negative for: history of anesthetic complications  Airway Mallampati: II  TM Distance: <3 FB Neck ROM: Full    Dental  (+) Teeth Intact   Pulmonary neg pulmonary ROS, former smoker,    Pulmonary exam normal        Cardiovascular hypertension, Pt. on medications + CAD (mild, nonobstructive)  Normal cardiovascular exam     Neuro/Psych PSYCHIATRIC DISORDERS Anxiety negative neurological ROS     GI/Hepatic negative GI ROS, Neg liver ROS,   Endo/Other  negative endocrine ROS  Renal/GU negative Renal ROS  negative genitourinary   Musculoskeletal  (+) Arthritis ,   Abdominal   Peds  Hematology negative hematology ROS (+)   Anesthesia Other Findings  Cleared by cardiology.  Per Sande Rives, PA-C, "Chart reviewed as part of pre-operative protocol coverage. 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 Echo on 11/22/2019 showed LVEF of 60-65% with normal wall motion and no significant valvular disease. He was recently seen by Dr. Stanford Breed on 11/23/2019 at which time he reported occasional chest heaviness not related to activities but no exertional symptoms. I called patient today and he reported no changes since recent visit.  Given past medical history and time since last visit, based on ACC/AHA guidelines, CATHERINE SAMA would be at acceptable risk for the planned procedure without further cardiovascular testing.   Echo 11/22/19: EF 60-65%, mild TR, mild AS (area 2.22), mild PR, mild pulmonary HTN  Reproductive/Obstetrics                            Anesthesia Physical Anesthesia Plan  ASA: II  Anesthesia Plan: General    Post-op Pain Management: GA combined w/ Regional for post-op pain   Induction: Intravenous  PONV Risk Score and Plan: 2 and Ondansetron, Dexamethasone, Treatment may vary due to age or medical condition and Midazolam  Airway Management Planned: Oral ETT  Additional Equipment: None  Intra-op Plan:   Post-operative Plan: Extubation in OR  Informed Consent: I have reviewed the patients History and Physical, chart, labs and discussed the procedure including the risks, benefits and alternatives for the proposed anesthesia with the patient or authorized representative who has indicated his/her understanding and acceptance.     Dental advisory given  Plan Discussed with:   Anesthesia Plan Comments:        Anesthesia Quick Evaluation

## 2020-01-11 ENCOUNTER — Encounter (HOSPITAL_COMMUNITY)
Admission: RE | Disposition: A | Discharge status: Home / Self Care | Payer: Self-pay | Source: Home / Self Care | Attending: Orthopedic Surgery

## 2020-01-11 ENCOUNTER — Other Ambulatory Visit: Payer: Self-pay

## 2020-01-11 ENCOUNTER — Ambulatory Visit (HOSPITAL_COMMUNITY): Payer: Medicare Other | Admitting: Anesthesiology

## 2020-01-11 ENCOUNTER — Encounter (HOSPITAL_COMMUNITY): Payer: Self-pay | Admitting: Orthopedic Surgery

## 2020-01-11 ENCOUNTER — Ambulatory Visit (HOSPITAL_COMMUNITY)
Admission: RE | Admit: 2020-01-11 | Discharge: 2020-01-11 | Disposition: A | Discharge status: Home / Self Care | Payer: Medicare Other | Attending: Orthopedic Surgery | Admitting: Orthopedic Surgery

## 2020-01-11 ENCOUNTER — Ambulatory Visit (HOSPITAL_COMMUNITY): Payer: Medicare Other | Admitting: Physician Assistant

## 2020-01-11 DIAGNOSIS — E785 Hyperlipidemia, unspecified: Secondary | ICD-10-CM | POA: Diagnosis not present

## 2020-01-11 DIAGNOSIS — H269 Unspecified cataract: Secondary | ICD-10-CM | POA: Insufficient documentation

## 2020-01-11 DIAGNOSIS — R7303 Prediabetes: Secondary | ICD-10-CM | POA: Insufficient documentation

## 2020-01-11 DIAGNOSIS — Z79899 Other long term (current) drug therapy: Secondary | ICD-10-CM | POA: Diagnosis not present

## 2020-01-11 DIAGNOSIS — Z87891 Personal history of nicotine dependence: Secondary | ICD-10-CM | POA: Diagnosis not present

## 2020-01-11 DIAGNOSIS — Z881 Allergy status to other antibiotic agents status: Secondary | ICD-10-CM | POA: Insufficient documentation

## 2020-01-11 DIAGNOSIS — M25511 Pain in right shoulder: Secondary | ICD-10-CM | POA: Insufficient documentation

## 2020-01-11 DIAGNOSIS — I10 Essential (primary) hypertension: Secondary | ICD-10-CM | POA: Diagnosis not present

## 2020-01-11 DIAGNOSIS — Z9852 Vasectomy status: Secondary | ICD-10-CM | POA: Diagnosis not present

## 2020-01-11 DIAGNOSIS — G8918 Other acute postprocedural pain: Secondary | ICD-10-CM | POA: Diagnosis not present

## 2020-01-11 DIAGNOSIS — Z7982 Long term (current) use of aspirin: Secondary | ICD-10-CM | POA: Diagnosis not present

## 2020-01-11 DIAGNOSIS — F419 Anxiety disorder, unspecified: Secondary | ICD-10-CM | POA: Insufficient documentation

## 2020-01-11 DIAGNOSIS — M75101 Unspecified rotator cuff tear or rupture of right shoulder, not specified as traumatic: Secondary | ICD-10-CM | POA: Diagnosis not present

## 2020-01-11 DIAGNOSIS — I2510 Atherosclerotic heart disease of native coronary artery without angina pectoris: Secondary | ICD-10-CM | POA: Diagnosis not present

## 2020-01-11 HISTORY — PX: REVERSE SHOULDER ARTHROPLASTY: SHX5054

## 2020-01-11 LAB — TYPE AND SCREEN
ABO/RH(D): O POS
Antibody Screen: NEGATIVE

## 2020-01-11 SURGERY — ARTHROPLASTY, SHOULDER, TOTAL, REVERSE
Anesthesia: General | Site: Shoulder | Laterality: Right

## 2020-01-11 MED ORDER — FENTANYL CITRATE (PF) 100 MCG/2ML IJ SOLN
INTRAMUSCULAR | Status: AC
  Filled 2020-01-11: qty 2

## 2020-01-11 MED ORDER — FENTANYL CITRATE (PF) 100 MCG/2ML IJ SOLN: 25.0000 ug | INTRAMUSCULAR | Status: DC | PRN

## 2020-01-11 MED ORDER — OXYCODONE-ACETAMINOPHEN 5-325 MG PO TABS: ORAL_TABLET | ORAL | 0 refills | Status: DC

## 2020-01-11 MED ORDER — LACTATED RINGERS IV SOLN: INTRAVENOUS | Status: DC

## 2020-01-11 MED ORDER — ROCURONIUM BROMIDE 10 MG/ML (PF) SYRINGE
PREFILLED_SYRINGE | INTRAVENOUS | Status: DC | PRN
  Administered 2020-01-11: 60 mg via INTRAVENOUS

## 2020-01-11 MED ORDER — MIDAZOLAM HCL 2 MG/2ML IJ SOLN
INTRAMUSCULAR | Status: DC | PRN
  Administered 2020-01-11: 2 mg via INTRAVENOUS

## 2020-01-11 MED ORDER — PROPOFOL 10 MG/ML IV BOLUS
INTRAVENOUS | Status: DC | PRN
  Administered 2020-01-11: 20 mg via INTRAVENOUS
  Administered 2020-01-11: 150 mg via INTRAVENOUS

## 2020-01-11 MED ORDER — FENTANYL CITRATE (PF) 100 MCG/2ML IJ SOLN
INTRAMUSCULAR | Status: DC | PRN
  Administered 2020-01-11 (×2): 50 ug via INTRAVENOUS

## 2020-01-11 MED ORDER — LIDOCAINE 2% (20 MG/ML) 5 ML SYRINGE
INTRAMUSCULAR | Status: DC | PRN
  Administered 2020-01-11: 60 mg via INTRAVENOUS

## 2020-01-11 MED ORDER — FENTANYL CITRATE (PF) 100 MCG/2ML IJ SOLN
INTRAMUSCULAR | Status: AC
  Administered 2020-01-11: 50 ug via INTRAVENOUS
  Filled 2020-01-11: qty 2

## 2020-01-11 MED ORDER — DEXAMETHASONE SODIUM PHOSPHATE 10 MG/ML IJ SOLN
INTRAMUSCULAR | Status: AC
  Filled 2020-01-11: qty 1

## 2020-01-11 MED ORDER — TRANEXAMIC ACID-NACL 1000-0.7 MG/100ML-% IV SOLN
1000.0000 mg | INTRAVENOUS | Status: AC
  Administered 2020-01-11: 1000 mg via INTRAVENOUS

## 2020-01-11 MED ORDER — SODIUM CHLORIDE 0.9 % IR SOLN
Status: DC | PRN
  Administered 2020-01-11: 1000 mL

## 2020-01-11 MED ORDER — ONDANSETRON HCL 4 MG/2ML IJ SOLN
INTRAMUSCULAR | Status: AC
  Filled 2020-01-11: qty 2

## 2020-01-11 MED ORDER — LIDOCAINE 2% (20 MG/ML) 5 ML SYRINGE
INTRAMUSCULAR | Status: AC
  Filled 2020-01-11: qty 5

## 2020-01-11 MED ORDER — CHLORHEXIDINE GLUCONATE 4 % EX LIQD: 60.0000 mL | Freq: Once | CUTANEOUS | Status: DC

## 2020-01-11 MED ORDER — ONDANSETRON HCL 4 MG/2ML IJ SOLN
4.0000 mg | Freq: Once | INTRAMUSCULAR | Status: AC | PRN
  Administered 2020-01-11: 4 mg via INTRAVENOUS

## 2020-01-11 MED ORDER — PHENYLEPHRINE 40 MCG/ML (10ML) SYRINGE FOR IV PUSH (FOR BLOOD PRESSURE SUPPORT)
PREFILLED_SYRINGE | INTRAVENOUS | Status: DC | PRN
  Administered 2020-01-11 (×2): 120 ug via INTRAVENOUS

## 2020-01-11 MED ORDER — OXYCODONE HCL 5 MG/5ML PO SOLN: 5.0000 mg | Freq: Once | ORAL | Status: AC | PRN

## 2020-01-11 MED ORDER — SUGAMMADEX SODIUM 200 MG/2ML IV SOLN
INTRAVENOUS | Status: DC | PRN
  Administered 2020-01-11: 200 mg via INTRAVENOUS

## 2020-01-11 MED ORDER — PHENYLEPHRINE HCL (PRESSORS) 10 MG/ML IV SOLN
INTRAVENOUS | Status: AC
  Filled 2020-01-11: qty 1

## 2020-01-11 MED ORDER — STERILE WATER FOR IRRIGATION IR SOLN
Status: DC | PRN
  Administered 2020-01-11 (×2): 1000 mL

## 2020-01-11 MED ORDER — BUPIVACAINE LIPOSOME 1.3 % IJ SUSP
INTRAMUSCULAR | Status: DC | PRN
  Administered 2020-01-11: 10 mL

## 2020-01-11 MED ORDER — CEFAZOLIN SODIUM-DEXTROSE 2-4 GM/100ML-% IV SOLN
INTRAVENOUS | Status: AC
  Filled 2020-01-11: qty 100

## 2020-01-11 MED ORDER — MIDAZOLAM HCL 2 MG/2ML IJ SOLN
INTRAMUSCULAR | Status: AC
  Filled 2020-01-11: qty 2

## 2020-01-11 MED ORDER — CEFAZOLIN SODIUM-DEXTROSE 2-4 GM/100ML-% IV SOLN
2.0000 g | INTRAVENOUS | Status: AC
  Administered 2020-01-11: 2 g via INTRAVENOUS

## 2020-01-11 MED ORDER — TIZANIDINE HCL 4 MG PO TABS: 4.0000 mg | ORAL_TABLET | Freq: Three times a day (TID) | ORAL | 1 refills | Status: DC | PRN

## 2020-01-11 MED ORDER — OXYCODONE HCL 5 MG PO TABS
ORAL_TABLET | ORAL | Status: AC
  Administered 2020-01-11: 5 mg via ORAL
  Filled 2020-01-11: qty 1

## 2020-01-11 MED ORDER — OXYCODONE HCL 5 MG PO TABS: 5.0000 mg | ORAL_TABLET | Freq: Once | ORAL | Status: AC | PRN

## 2020-01-11 MED ORDER — DEXAMETHASONE SODIUM PHOSPHATE 10 MG/ML IJ SOLN
INTRAMUSCULAR | Status: DC | PRN
  Administered 2020-01-11: 8 mg via INTRAVENOUS

## 2020-01-11 MED ORDER — PHENYLEPHRINE HCL-NACL 10-0.9 MG/250ML-% IV SOLN
INTRAVENOUS | Status: DC | PRN
  Administered 2020-01-11: 50 ug/min via INTRAVENOUS

## 2020-01-11 MED ORDER — BUPIVACAINE HCL (PF) 0.5 % IJ SOLN
INTRAMUSCULAR | Status: DC | PRN
  Administered 2020-01-11: 15 mL via PERINEURAL

## 2020-01-11 MED ORDER — PROPOFOL 10 MG/ML IV BOLUS
INTRAVENOUS | Status: AC
  Filled 2020-01-11: qty 20

## 2020-01-11 MED ORDER — ONDANSETRON HCL 4 MG/2ML IJ SOLN
INTRAMUSCULAR | Status: DC | PRN
  Administered 2020-01-11: 4 mg via INTRAVENOUS

## 2020-01-11 MED ORDER — TRANEXAMIC ACID-NACL 1000-0.7 MG/100ML-% IV SOLN
INTRAVENOUS | Status: AC
  Filled 2020-01-11: qty 100

## 2020-01-11 SURGICAL SUPPLY — 86 items
AID PSTN UNV HD RSTRNT DISP (MISCELLANEOUS)
BAG SPEC THK2 15X12 ZIP CLS (MISCELLANEOUS) ×1
BAG ZIPLOCK 12X15 (MISCELLANEOUS) ×2 IMPLANT
BASEPLATE P2 COATD GLND 6.5X30 (Shoulder) IMPLANT
BIT DRILL 1.6MX128 (BIT) ×2 IMPLANT
BIT DRILL 2.5 DIA 127 CALI (BIT) ×1 IMPLANT
BIT DRILL 4 DIA CALIBRATED (BIT) ×1 IMPLANT
BLADE SAW SAG 73X25 THK (BLADE) ×1
BLADE SAW SGTL 73X25 THK (BLADE) ×1 IMPLANT
BOOTIES KNEE HIGH SLOAN (MISCELLANEOUS) ×4 IMPLANT
BSPLAT GLND 30 STRL LF SHLDR (Shoulder) ×1 IMPLANT
COOLER ICEMAN CLASSIC (MISCELLANEOUS) ×1 IMPLANT
COVER BACK TABLE 60X90IN (DRAPES) ×2 IMPLANT
COVER SURGICAL LIGHT HANDLE (MISCELLANEOUS) ×2 IMPLANT
COVER WAND RF STERILE (DRAPES) IMPLANT
DRAPE INCISE IOBAN 66X45 STRL (DRAPES) ×2 IMPLANT
DRAPE ORTHO SPLIT 77X108 STRL (DRAPES) ×4
DRAPE POUCH INSTRU U-SHP 10X18 (DRAPES) ×2 IMPLANT
DRAPE SHEET LG 3/4 BI-LAMINATE (DRAPES) ×2 IMPLANT
DRAPE SURG 17X11 SM STRL (DRAPES) ×2 IMPLANT
DRAPE SURG ORHT 6 SPLT 77X108 (DRAPES) ×2 IMPLANT
DRAPE U-SHAPE 47X51 STRL (DRAPES) ×2 IMPLANT
DRSG AQUACEL AG ADV 3.5X 6 (GAUZE/BANDAGES/DRESSINGS) ×2 IMPLANT
DURAPREP 26ML APPLICATOR (WOUND CARE) ×2 IMPLANT
ELECT BLADE TIP CTD 4 INCH (ELECTRODE) ×2 IMPLANT
ELECT REM PT RETURN 15FT ADLT (MISCELLANEOUS) ×2 IMPLANT
EVACUATOR 1/8 PVC DRAIN (DRAIN) IMPLANT
FACESHIELD WRAPAROUND (MASK) ×2 IMPLANT
FACESHIELD WRAPAROUND OR TEAM (MASK) ×1 IMPLANT
GLOVE BIO SURGEON STRL SZ7 (GLOVE) ×2 IMPLANT
GLOVE BIO SURGEON STRL SZ7.5 (GLOVE) ×2 IMPLANT
GLOVE BIOGEL PI IND STRL 7.5 (GLOVE) ×1 IMPLANT
GLOVE BIOGEL PI IND STRL 8 (GLOVE) ×1 IMPLANT
GLOVE BIOGEL PI INDICATOR 7.5 (GLOVE) ×1
GLOVE BIOGEL PI INDICATOR 8 (GLOVE) ×1
GOWN STRL REUS W/TWL LRG LVL3 (GOWN DISPOSABLE) ×2 IMPLANT
GOWN STRL REUS W/TWL XL LVL3 (GOWN DISPOSABLE) ×2 IMPLANT
HANDPIECE INTERPULSE COAX TIP (DISPOSABLE) ×2
HOOD PEEL AWAY FLYTE STAYCOOL (MISCELLANEOUS) ×6 IMPLANT
INSERT EPOLY STND HUMERUS 32MM (Shoulder) ×2 IMPLANT
INSERT EPOLYSTD HUMERUS 32MM (Shoulder) IMPLANT
KIT BASIN OR (CUSTOM PROCEDURE TRAY) ×2 IMPLANT
KIT TURNOVER KIT A (KITS) IMPLANT
MANIFOLD NEPTUNE II (INSTRUMENTS) ×2 IMPLANT
NDL TROCAR POINT SZ 2 1/2 (NEEDLE) IMPLANT
NEEDLE TROCAR POINT SZ 2 1/2 (NEEDLE) IMPLANT
NS IRRIG 1000ML POUR BTL (IV SOLUTION) ×2 IMPLANT
P2 COATDE GLNOID BSEPLT 6.5X30 (Shoulder) ×2 IMPLANT
PACK SHOULDER (CUSTOM PROCEDURE TRAY) ×2 IMPLANT
PAD COLD SHLDR WRAP-ON (PAD) ×1 IMPLANT
PENCIL SMOKE EVACUATOR (MISCELLANEOUS) IMPLANT
PROTECTOR NERVE ULNAR (MISCELLANEOUS) ×2 IMPLANT
RESTRAINT HEAD UNIVERSAL NS (MISCELLANEOUS) IMPLANT
RETRIEVER SUT HEWSON (MISCELLANEOUS) ×1 IMPLANT
SCREW BONE LOCKING RSP 5.0X34 (Screw) ×4 IMPLANT
SCREW BONE RSP LOCK 5X18 (Screw) IMPLANT
SCREW BONE RSP LOCK 5X22 (Screw) IMPLANT
SCREW BONE RSP LOCK 5X34 (Screw) IMPLANT
SCREW BONE RSP LOCKING 18MM LG (Screw) ×2 IMPLANT
SCREW BONE RSP LOCKING 5.0X32 (Screw) ×2 IMPLANT
SCREW RETAIN W/HEAD 32MM (Shoulder) ×1 IMPLANT
SET HNDPC FAN SPRY TIP SCT (DISPOSABLE) ×1 IMPLANT
SLING ARM FOAM STRAP LRG (SOFTGOODS) ×1 IMPLANT
SLING ARM IMMOBILIZER LRG (SOFTGOODS) IMPLANT
SLING ARM IMMOBILIZER MED (SOFTGOODS) IMPLANT
SMARTMIX MINI TOWER (MISCELLANEOUS) ×2
SPONGE LAP 18X18 RF (DISPOSABLE) ×2 IMPLANT
STEM HUMERAL STD SHELL 10X48 (Stem) ×1 IMPLANT
STRIP CLOSURE SKIN 1/2X4 (GAUZE/BANDAGES/DRESSINGS) ×3 IMPLANT
SUCTION FRAZIER HANDLE 10FR (MISCELLANEOUS)
SUCTION TUBE FRAZIER 10FR DISP (MISCELLANEOUS) IMPLANT
SUPPORT WRAP ARM LG (MISCELLANEOUS) ×2 IMPLANT
SUT ETHIBOND 2 OS 4 DA (SUTURE) ×1 IMPLANT
SUT ETHIBOND NAB CT1 #1 30IN (SUTURE) ×2 IMPLANT
SUT FIBERWIRE #2 38 REV NDL BL (SUTURE)
SUT MNCRL AB 4-0 PS2 18 (SUTURE) ×2 IMPLANT
SUT VIC AB 0 CT1 36 (SUTURE) IMPLANT
SUT VIC AB 2-0 CT1 27 (SUTURE) ×4
SUT VIC AB 2-0 CT1 TAPERPNT 27 (SUTURE) ×2 IMPLANT
SUTURE FIBERWR#2 38 REV NDL BL (SUTURE) IMPLANT
TAPE LABRALWHITE 1.5X36 (TAPE) ×2 IMPLANT
TOWEL OR 17X26 10 PK STRL BLUE (TOWEL DISPOSABLE) ×2 IMPLANT
TOWEL OR NON WOVEN STRL DISP B (DISPOSABLE) ×2 IMPLANT
TOWER SMARTMIX MINI (MISCELLANEOUS) IMPLANT
WATER STERILE IRR 1000ML POUR (IV SOLUTION) ×2 IMPLANT
YANKAUER SUCT BULB TIP 10FT TU (MISCELLANEOUS) ×2 IMPLANT

## 2020-01-11 NOTE — Transfer of Care (Signed)
Immediate Anesthesia Transfer of Care Note  Patient: Cory Barnett  Procedure(s) Performed: REVERSE SHOULDER ARTHROPLASTY (Right Shoulder)  Patient Location: PACU  Anesthesia Type:General  Level of Consciousness: awake, alert  and oriented  Airway & Oxygen Therapy: Patient Spontanous Breathing and Patient connected to face mask oxygen  Post-op Assessment: Report given to RN and Post -op Vital signs reviewed and stable  Post vital signs: Reviewed and stable  Last Vitals:  Vitals Value Taken Time  BP    Temp    Pulse 63 01/11/20 0908  Resp 14 01/11/20 0908  SpO2 100 % 01/11/20 0908  Vitals shown include unvalidated device data.  Last Pain:  Vitals:   01/11/20 0634  TempSrc: Oral  PainSc:          Complications: No apparent anesthesia complications

## 2020-01-11 NOTE — Anesthesia Postprocedure Evaluation (Signed)
Anesthesia Post Note  Patient: Cory Barnett  Procedure(s) Performed: REVERSE SHOULDER ARTHROPLASTY (Right Shoulder)     Patient location during evaluation: PACU Anesthesia Type: General Level of consciousness: awake and alert Pain management: pain level controlled Vital Signs Assessment: post-procedure vital signs reviewed and stable Respiratory status: spontaneous breathing, nonlabored ventilation and respiratory function stable Cardiovascular status: blood pressure returned to baseline and stable Postop Assessment: no apparent nausea or vomiting Anesthetic complications: no    Last Vitals:  Vitals:   01/11/20 1015 01/11/20 1115  BP: 116/63 131/66  Pulse: 66 73  Resp: 16 16  Temp:  36.4 C  SpO2: 95% 95%    Last Pain:  Vitals:   01/11/20 1115  TempSrc:   PainSc: 0-No pain                 Lidia Collum

## 2020-01-11 NOTE — Anesthesia Procedure Notes (Signed)
Anesthesia Regional Block: Interscalene brachial plexus block   Pre-Anesthetic Checklist: ,, timeout performed, Correct Patient, Correct Site, Correct Laterality, Correct Procedure, Correct Position, site marked, Risks and benefits discussed,  Surgical consent,  Pre-op evaluation,  At surgeon's request and post-op pain management  Laterality: Right  Prep: chloraprep       Needles:  Injection technique: Single-shot  Needle Type: Echogenic Stimulator Needle     Needle Length: 10cm  Needle Gauge: 20     Additional Needles:   Procedures:,,,, ultrasound used (permanent image in chart),,,,  Narrative:  Start time: 01/11/2020 7:05 AM End time: 01/11/2020 7:08 AM Injection made incrementally with aspirations every 5 mL.  Performed by: Personally  Anesthesiologist: Lidia Collum, MD  Additional Notes: Monitors applied. Injection made in 5cc increments. No resistance to injection. Good needle visualization. Patient tolerated procedure well.

## 2020-01-11 NOTE — Op Note (Signed)
Procedure(s): REVERSE SHOULDER ARTHROPLASTY Procedure Note  TILL LOUPE male 74 y.o. 01/11/2020  Preoperative diagnosis: Right shoulder irreparable rotator cuff tear  Postoperative diagnosis: Same  Procedure(s) and Anesthesia Type:    R  REVERSE SHOULDER ARTHROPLASTY - Choice   Indications:  74 y.o. male  With right shoulder irreparable rotator cuff tear.  Pain and dysfunction interfered with quality of life and nonoperative treatment with activity modification, physical therapy, arthroscopy, NSAIDS and injections failed.     Surgeon: Isabella Stalling   Assistants: Jeanmarie Hubert PA-C [facility] was present and scrubbed throughout the procedure and was essential in positioning, retraction, exposure, and closure)    Procedure Detail  Right REVERSE SHOULDER ARTHROPLASTY   Estimated Blood Loss:  200 mL         Drains: none  Blood Given: none          Specimens: none        Complications:  * No complications entered in OR log *         Disposition: PACU - hemodynamically stable.         Condition: stable      OPERATIVE FINDINGS:  A DJO Altivate pressfit reverse total shoulder arthroplasty was placed with a  size 10 stem, a 32 standard glenosphere, and a standard poly insert. The base plate  fixation was excellent.  PROCEDURE: The patient was identified in the preoperative holding area  where I personally marked the operative site after verifying site, side,  and procedure with the patient. An interscalene block given by  the attending anesthesiologist in the holding area and the patient was taken back to the operating room where all extremities were  carefully padded in position after general anesthesia was induced. She  was placed in a beach-chair position and the operative upper extremity was  prepped and draped in a standard sterile fashion. An approximately 10-  cm incision was made from the tip of the coracoid process to the center  point of the  humerus at the level of the axilla. Dissection was carried  down through subcutaneous tissues to the level of the cephalic vein  which was taken laterally with the deltoid. The pectoralis major was  retracted medially. The subdeltoid space was developed and the lateral  edge of the conjoined tendon was identified. The undersurface of  conjoined tendon was palpated and the musculocutaneous nerve was not in  the field. Retractor was placed underneath the conjoined and second  retractor was placed lateral into the deltoid. The circumflex humeral  artery and vessels were identified and clamped and coagulated. The  biceps tendon was tenodesed to the upper border of the pectoralis major.  The subscapularis was taken down as a peel.  The  joint was then gently externally rotated while the capsule was released  from the humeral neck around to just beyond the 6 o'clock position. At  this point, the joint was dislocated and the humeral head was presented  into the wound. The excessive osteophyte formation was removed with a  large rongeur.  The cutting guide was used to make the appropriate  head cut and the head was saved for potentially bone grafting.  The glenoid was exposed with the arm in an  abducted extended position. The anterior and posterior labrum were  completely excised and the capsule was released circumferentially to  allow for exposure of the glenoid for preparation. The 2.5 mm drill was  placed using the guide in 5-10 inferior angulation and  the tap was then advanced in the same hole. Small and large reamers were then used. The tap was then removed and the Metaglene was then screwed in with excellent purchase.  The peripheral guide was then used to drilled measured and filled peripheral locking screws. The size 32 standard glenosphere was then impacted on the [facility] taper and the central screw was placed. The humerus was then again exposed and the diaphyseal reamers were used followed by  the metaphyseal reamers. The final broach was left in place in the proximal trial was placed. The joint was reduced and with this implant it was felt that soft tissue tensioning was appropriate with excellent stability and excellent range of motion. Therefore, final humeral stem was placed press-fit.  And then the trial polyethylene inserts were tested again and the above implant was felt to be the most appropriate for final insertion. The joint was reduced taken through full range of motion and felt to be stable. Soft tissue tension was appropriate.  The joint was then copiously irrigated with pulse  lavage and the wound was then closed. The subscapularis was repaired using 2 labral tapes through bone tunnels.  Skin was closed with 2-0 Vicryl in a deep dermal layer and 4-0  Monocryl for skin closure. Steri-Strips were applied. Sterile  dressings were then applied as well as a sling. The patient was allowed  to awaken from general anesthesia, transferred to stretcher, and taken  to recovery room in stable condition.   POSTOPERATIVE PLAN: The patient will be observed in the recovery room and if pain is well controlled and physiologically doing well he will be discharged home today with his wife.

## 2020-01-11 NOTE — H&P (Signed)
Cory Barnett is an 74 y.o. male.   Chief Complaint: R shoulder pain and dysfuntion HPI: R shoulder irrepairable RCT with continued pain and dysfunction not improved with conservative management or arthroscopy.  Past Medical History:  Diagnosis Date  . Anxiety   . AV block, 1st degree   . Basal cell carcinoma   . Cardiac murmur   . Cataract   . Elevated PSA measurement 2018  . Hyperlipidemia   . Hypertension 2015  . 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 IFG (impaired fasting glucose) 01/2016  . Pre-diabetes   . RBBB (right bundle branch block)   . Rheumatic fever    age 59 or 23, in hospital for a month, out of school for a yr  . Seasonal allergies   . Squamous cell carcinoma     Past Surgical History:  Procedure Laterality Date  . EYE SURGERY     right eye  cataract surgery with lens implant  . NASAL SEPTUM SURGERY    . NOSE SURGERY    . SHOULDER ARTHROSCOPY WITH SUBACROMIAL DECOMPRESSION Right 08/07/2019   Procedure: SHOULDER ARTHROSCOPY WITH SUBACROMIAL DECOMPRESSION;  Surgeon: Tania Ade, MD;  Location: [place];  Service: Orthopedics;  Laterality: Right;  . SKIN CANCER EXCISION     basal and squamous cell   . TONSILLECTOMY    . VASECTOMY    . WISDOM TOOTH EXTRACTION      Family History  Problem Relation Age of Onset  . Stroke Mother   . Arthritis Mother   . Cancer Mother        unknown   . Dementia Mother   . Arthritis Father   . Mesothelioma Father   . Arthritis Sister   . Arthritis Sister   . Bipolar disorder Sister    Social History:  reports that he quit smoking about 50 years ago. His smoking use included cigarettes. He has never used smokeless tobacco. He reports current alcohol use of about 2.0 standard drinks of alcohol per week. He reports that he does not use drugs.  Allergies:  Allergies  Allergen Reactions  . Bee Venom Anaphylaxis  . Moxifloxacin Shortness Of Breath  . Shellfish Allergy Itching and Rash    Medications Prior  to Admission  Medication Sig Dispense Refill  . aspirin EC 81 MG tablet Take 81 mg by mouth daily.    . CHELATED MAGNESIUM PO Take 2 tablets by mouth in the morning and at bedtime.     . Coenzyme Q10 (CO Q 10) 100 MG CAPS Take 100 mg by mouth in the morning and at bedtime.     Marland Kitchen escitalopram (LEXAPRO) 10 MG tablet Take 10 mg by mouth daily.     Marland Kitchen ibuprofen (ADVIL) 200 MG tablet Take 400 mg by mouth every 6 (six) hours as needed for fever, headache or moderate pain.    Marland Kitchen losartan (COZAAR) 100 MG tablet Take 1 tablet (100 mg total) by mouth daily. (Patient taking differently: Take 100 mg by mouth daily after supper. ) 90 tablet 3  . Multiple Vitamin (MULTI-VITAMINS) TABS Take 3 tablets by mouth in the morning and at bedtime.     Marland Kitchen OVER THE COUNTER MEDICATION Take 2 tablets by mouth in the morning and at bedtime. Biotics Biomega 1000 (total body support)    . Probiotic Product (PROBIOTIC PO) Take 1 capsule by mouth daily.    . rosuvastatin (CRESTOR) 10 MG tablet Take 1 tablet (10 mg total) by mouth  daily. 90 tablet 3  . Saw Palmetto, Serenoa repens, (SAW PALMETTO PO) Take 2 capsules by mouth in the morning and at bedtime.    . metoprolol tartrate (LOPRESSOR) 100 MG tablet Take 1 tablet (100 mg total) by mouth once for 1 dose. Take 2 hours prior to test (Patient not taking: Reported on 01/04/2020) 1 tablet 0  . tadalafil (CIALIS) 10 MG tablet Take 1 tablet (10 mg total) by mouth daily as needed for erectile dysfunction. (Patient not taking: Reported on 01/04/2020) 10 tablet 0    No results found for this or any previous visit (from the past 48 hour(s)). No results found.  Review of Systems  All other systems reviewed and are negative.   Blood pressure (!) 148/89, pulse 68, temperature 98.3 F (36.8 C), temperature source Oral, resp. rate 16, SpO2 97 %. Physical Exam  Constitutional: He is oriented to person, place, and time. He appears well-developed and well-nourished.  HENT:  Head: Atraumatic.   Eyes: EOM are normal.  Cardiovascular: Intact distal pulses.  Respiratory: Effort normal.  Musculoskeletal:     Comments: R shoulder pain with significant dysfunction. NViD  Neurological: He is alert and oriented to person, place, and time.  Skin: Skin is warm and dry.  Psychiatric: He has a normal mood and affect.     Assessment/Plan R shoulder irrepairable RCT with continued pain and dysfunction not improved with conservative management or arthroscopy. Plan R reverse TSA Risks / benefits of surgery discussed Consent on chart  NPO for OR Preop antibiotics   Isabella Stalling, MD 01/11/2020, 7:08 AM

## 2020-01-11 NOTE — Anesthesia Procedure Notes (Signed)
Procedure Name: Intubation Date/Time: 01/11/2020 7:30 AM Performed by: Niel Hummer, CRNA Pre-anesthesia Checklist: Patient identified, Emergency Drugs available, Suction available and Patient being monitored Patient Re-evaluated:Patient Re-evaluated prior to induction Oxygen Delivery Method: Circle system utilized Preoxygenation: Pre-oxygenation with 100% oxygen Induction Type: IV induction Ventilation: Mask ventilation without difficulty Laryngoscope Size: Mac and 4 Grade View: Grade II Tube type: Oral Tube size: 7.5 mm Number of attempts: 1 Airway Equipment and Method: Stylet Placement Confirmation: ETT inserted through vocal cords under direct vision,  positive ETCO2 and breath sounds checked- equal and bilateral Secured at: 23 cm Tube secured with: Tape Dental Injury: Teeth and Oropharynx as per pre-operative assessment

## 2020-01-11 NOTE — Discharge Instructions (Signed)

## 2020-01-18 ENCOUNTER — Encounter: Payer: Self-pay | Admitting: *Deleted

## 2020-01-24 DIAGNOSIS — Z96611 Presence of right artificial shoulder joint: Secondary | ICD-10-CM | POA: Diagnosis not present

## 2020-01-24 DIAGNOSIS — Z471 Aftercare following joint replacement surgery: Secondary | ICD-10-CM | POA: Diagnosis not present

## 2020-01-31 ENCOUNTER — Ambulatory Visit: Payer: Medicare Other | Attending: Orthopedic Surgery

## 2020-01-31 ENCOUNTER — Other Ambulatory Visit: Payer: Self-pay

## 2020-01-31 DIAGNOSIS — M6281 Muscle weakness (generalized): Secondary | ICD-10-CM | POA: Insufficient documentation

## 2020-01-31 DIAGNOSIS — R600 Localized edema: Secondary | ICD-10-CM | POA: Diagnosis not present

## 2020-01-31 DIAGNOSIS — M25511 Pain in right shoulder: Secondary | ICD-10-CM | POA: Insufficient documentation

## 2020-01-31 DIAGNOSIS — M25611 Stiffness of right shoulder, not elsewhere classified: Secondary | ICD-10-CM | POA: Diagnosis not present

## 2020-01-31 NOTE — Therapy (Signed)
[facility] [address] [place], [place], [address] Phone: [phone]   Fax:  [phone]  Physical Therapy Evaluation  Patient Details  Name: Cory Barnett MRN: [id number] Date of Birth: 05-10-1946 Referring Provider (PT): Tania Ade, MD   Encounter Date: 01/31/2020  PT End of Session - 01/31/20 0850    Visit Number  1    Date for PT Re-Evaluation  03/27/20    Authorization Type  Medicare    Progress Note Due on Visit  10    PT Start Time  0807    PT Stop Time  0900    PT Time Calculation (min)  53 min    Activity Tolerance  Patient tolerated treatment well    Behavior During Therapy  [facility] for tasks assessed/performed       Past Medical History:  Diagnosis Date  . Anxiety   . AV block, 1st degree   . Basal cell carcinoma   . Cardiac murmur   . Cataract   . Elevated PSA measurement 2018  . Hyperlipidemia   . Hypertension 2015  . 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 IFG (impaired fasting glucose) 01/2016  . Pre-diabetes   . RBBB (right bundle branch block)   . Rheumatic fever    age 12 or 69, in hospital for a month, out of school for a yr  . Seasonal allergies   . Squamous cell carcinoma     Past Surgical History:  Procedure Laterality Date  . EYE SURGERY     right eye  cataract surgery with lens implant  . NASAL SEPTUM SURGERY    . NOSE SURGERY    . 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 SHOULDER ARTHROSCOPY WITH SUBACROMIAL DECOMPRESSION Right 08/07/2019   Procedure: SHOULDER ARTHROSCOPY WITH SUBACROMIAL DECOMPRESSION;  Surgeon: Tania Ade, MD;  Location: [place];  Service: Orthopedics;  Laterality: Right;  . SKIN CANCER EXCISION     basal and squamous cell   . TONSILLECTOMY    . VASECTOMY    . WISDOM TOOTH EXTRACTION       There were no vitals filed for this visit.   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  Follow protocol.  KX at visit 15    Patient Stated Goals  raise arm overhead, improve strength of Rt UE    Currently in Pain?  Yes    Pain Score  1    brief up to 10/10 with pushing/pulling   Pain Location  Shoulder    Pain Orientation  Right    Pain Descriptors / Indicators  Aching;Dull    Pain Type  Surgical pain    Pain Onset  1 to 4 weeks ago    Aggravating Factors   sleep, movement of Rt UE    Pain Relieving Factors  rest, ice, Tylenol         [facility] PT Assessment - 01/31/20 0001      Assessment   Medical Diagnosis  s/p Rt reverse total shoulder replacement    Referring Provider (PT)  Tania Ade, MD    Onset Date/Surgical Date  01/11/20   fall 05/2019   Hand Dominance  Right    Next MD Visit  02/23/20    Prior Therapy  prior to surgery      Precautions   Precautions  Shoulder    Type of Shoulder Precautions  follow post-op protocol    Required Braces or Orthoses  Sling   as needed     Balance Screen   Has the patient fallen in the past 6 months  No    Has the patient had a decrease in activity level because of a fear of falling?   No    Is the patient reluctant to leave their home because of a fear of falling?   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      Cognition   Overall Cognitive Status  Within Functional Limits for tasks assessed      Observation/Other Assessments   Focus on Therapeutic Outcomes (FOTO)   56% limitation      Posture/Postural Control   Posture/Postural Control  Postural limitations    Postural  Limitations  Forward head;Rounded Shoulders      ROM / Strength   AROM / PROM / Strength  AROM;PROM;Strength      AROM   Overall AROM   Unable to assess;Due to precautions      PROM   Overall PROM   Deficits    Overall PROM Comments  full Lt elbow and wrist A/ROM    PROM Assessment Site  Shoulder    Right/Left Shoulder  Right    Right Shoulder Flexion  105 Degrees    Right Shoulder ABduction  75 Degrees    Right Shoulder Internal Rotation  35 Degrees    Right Shoulder External Rotation  25 Degrees      Strength   Overall Strength  Unable to assess;Due to precautions      Palpation   Palpation comment  fluid at Rt elbow- no pain.  Well healing surgical incision over Rt anterior glenohumeral joint.  Reduced mobility over distal 1/3 of the incision.                Objective measurements completed on examination: See above findings.      [facility] Adult PT Treatment/Exercise - 01/31/20 0001      Modalities   Modalities  Vasopneumatic      Vasopneumatic   Number Minutes Vasopneumatic   15 minutes    Vasopnuematic Location   Shoulder    Vasopneumatic Pressure  Medium    Vasopneumatic Temperature   3 snow flakes             PT Education - 01/31/20 0843    Education Details  Access Code: [email or web address]    Person(s) Educated  Patient    Methods  Explanation;Demonstration;Handout    Comprehension  Verbalized understanding;Returned demonstration       PT Short Term Goals - 01/31/20 0953      PT SHORT TERM GOAL #1   Title  independent with initial HEP    Time  4    Period  Weeks    Status  New    Target Date  02/28/20      PT SHORT TERM GOAL #2   Title  pt will improve use of Rt UE for tasks below the  level of the chest to 75% without limitation    Time  4    Period  Weeks    Status  New    Target Date  02/28/20      PT SHORT TERM GOAL #3   Title  demonstrate Rt shoulder A/ROM flexion to > or = to 70 degrees without scapular elevation to improve ability to  use with ADLs    Time  4    Period  Weeks    Status  New    Target Date  02/28/20      PT SHORT TERM GOAL #4   Title  demonstrate Rt shoulder P/ROM ER to > or = to 60 degrees to improve mobility needed for self-care    Time  4    Period  Weeks    Status  New    Target Date  02/28/20        PT Long Term Goals - 01/31/20 0957      PT LONG TERM GOAL #1   Title  The patient will be independent in safe self progression of HEP  for further improvements in ROM and strength    Time  8    Period  Weeks    Status  New    Target Date  03/27/20      PT LONG TERM GOAL #2   Title  The patient will have improved right shoulder A/ROM to 120 degrees needed for reaching eye level shelves and personal care    Baseline  --    Time  8    Period  Weeks    Status  New    Target Date  03/27/20      PT LONG TERM GOAL #3   Title  demonstrate 4-/5 Rt shoulder flexion and IR to improve functional endurance for ADLs and self-care    Baseline  --    Time  8    Period  Weeks    Status  New    Target Date  03/27/20      PT LONG TERM GOAL #4   Title  demonstrate Rt shoulder ER to C5 without scapular elevation to improve use with self-care    Baseline  --    Time  8    Period  Weeks    Status  New    Target Date  03/27/20      PT LONG TERM GOAL #5   Title  demonstrate Rt shoulder A/ROM IR to L3 to improve independence and ease with dressing    Time  8    Period  Weeks    Status  New    Target Date  03/27/20      Additional Long Term Goals   Additional Long Term Goals  Yes      PT LONG TERM GOAL #6   Title  reduce FOTO to <  or = to 33% limitation    Time  8    Period  Weeks    Status  New    Target Date  03/27/20             Plan - 01/31/20 [id number]    Clinical Impression Statement  Pt presents to PT s/p Rt reverse total shoulder arthroplasty performed 01/11/20 due to RTC tear sustained during a fall in July 2020.  Pt has discharged use of sling at home per MD orders.  Pt is  wearing the sling to help when fatigued and when having pain.  Pt reports 1/10 Rt shoulder pain at rest and up to [id number] with certain motions.  Pt demonstrates limited Rt shoulder P/ROM limited by pain with empty end feel.  Pt with well healing surgical incision and reduced scar mobility over distal 1/3.  Pt is not using Rt UE for functional activity above the waist and MD has limited lifting to 12 oz.  First phase of post op protocol allows for A/ROM, P/ROM and AA/ROM as tolerated.  Pt will benefit from skilled PT s/p Rt reverse TSA to allow for safe progression of strength, flexibility and functional use of the Rt UE.    Personal Factors and Comorbidities  Comorbidity 1;Age    Comorbidities  Rt TSA       Patient will benefit from skilled therapeutic intervention in order to improve the following deficits and impairments:     Visit Diagnosis: Muscle weakness (generalized) - Plan: PT plan of care cert/re-cert  Acute pain of right shoulder - Plan: PT plan of care cert/re-cert  Stiffness of right shoulder, not elsewhere classified - Plan: PT plan of care cert/re-cert  Localized edema - Plan: PT plan of care cert/re-cert     Problem List Patient Active Problem List   Diagnosis Date Noted  . Piriformis syndrome of left side 08/18/2019  . Rotator cuff tear, right 05/29/2019  . Closed fracture of distal clavicle 05/15/2019  . 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, [place], [place], [address] Phone: [phone]   Fax:  [phone]  Name: Cory Barnett MRN: [id number] Date of Birth: [birth year]

## 2020-01-31 NOTE — Patient Instructions (Signed)
Access Code: [email or web address] URL: [email or web address] Date: 01/31/2020 Prepared by: Claiborne Billings  Exercises Seated Gripping Towel - 3 x daily - 7 x weekly - 10 reps - 2 sets Circular Shoulder Pendulum with Table Support - 3 x daily - 7 x weekly - 10 reps - 1 sets Seated Shoulder Shrugs - 3 x daily - 7 x weekly - 10 reps - 2 sets Supine Shoulder Flexion AAROM with Hands Clasped - 3 x daily - 7 x weekly - 10 reps - 1 sets - 10 hold Seated Shoulder Flexion AAROM with Pulley Behind - 3 x daily - 7 x weekly Supine Shoulder Press AAROM in Abduction with Dowel - 2 x daily - 7 x weekly - 10 sets - 10 reps

## 2020-02-06 ENCOUNTER — Ambulatory Visit: Payer: Medicare Other | Attending: Orthopedic Surgery

## 2020-02-06 ENCOUNTER — Other Ambulatory Visit: Payer: Self-pay

## 2020-02-06 DIAGNOSIS — R600 Localized edema: Secondary | ICD-10-CM | POA: Diagnosis not present

## 2020-02-06 DIAGNOSIS — M25611 Stiffness of right shoulder, not elsewhere classified: Secondary | ICD-10-CM | POA: Diagnosis not present

## 2020-02-06 DIAGNOSIS — M6281 Muscle weakness (generalized): Secondary | ICD-10-CM

## 2020-02-06 DIAGNOSIS — M25511 Pain in right shoulder: Secondary | ICD-10-CM | POA: Diagnosis not present

## 2020-02-06 NOTE — Therapy (Signed)
[facility] [address], [place], [place], [address] Phone: [phone]   Fax:  [phone]  Physical Therapy Treatment  Patient Details  Name: Cory Barnett MRN: [id number] Date of Birth: [birth year] Referring Provider (PT): Tania Ade, MD   Encounter Date: 02/06/2020  PT End of Session - 02/06/20 1023    Visit Number  2    Date for PT Re-Evaluation  03/27/20    Authorization Type  Medicare    Progress Note Due on Visit  10    PT Start Time  0934    PT Stop Time  1030    PT Time Calculation (min)  56 min    Activity Tolerance  Patient tolerated treatment well    Behavior During Therapy  [facility] for tasks assessed/performed       Past Medical History:  Diagnosis Date  . Anxiety   . AV block, 1st degree   . Basal cell carcinoma   . Cardiac murmur   . Cataract   . Elevated PSA measurement 2018  . Hyperlipidemia   . Hypertension 2015  . 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 IFG (impaired fasting glucose) 01/2016  . Pre-diabetes   . RBBB (right bundle branch block)   . Rheumatic fever    age 67 or 73, in hospital for a month, out of school for a yr  . Seasonal allergies   . Squamous cell carcinoma     Past Surgical History:  Procedure Laterality Date  . EYE SURGERY     right eye  cataract surgery with lens implant  . NASAL SEPTUM SURGERY    . NOSE SURGERY    . 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 SHOULDER ARTHROSCOPY WITH SUBACROMIAL DECOMPRESSION Right 08/07/2019   Procedure: SHOULDER ARTHROSCOPY WITH SUBACROMIAL DECOMPRESSION;  Surgeon: Tania Ade, MD;  Location: [place];  Service: Orthopedics;  Laterality: Right;  . SKIN CANCER EXCISION     basal and squamous cell   . TONSILLECTOMY    . VASECTOMY    . WISDOM TOOTH EXTRACTION       There were no vitals filed for this visit.  Subjective Assessment - 02/06/20 0939    Subjective  I have been using my pulleys and it feels good to stretch.    Pertinent History  MD orders: ok for full A/AROM to tolerance.  Follow protocol.  KX at visit 15    Patient Stated Goals  raise arm overhead, improve strength of Rt UE    Currently in Pain?  Yes    Pain Score  2     Pain Location  Shoulder    Pain Orientation  Right    Pain Descriptors / Indicators  Aching    Pain Type  Surgical pain    Pain Onset  1 to 4 weeks ago    Pain Frequency  Intermittent    Aggravating Factors   after stretching, movement of Rt UE    Pain Relieving Factors  rest, ice, Tylenol                       [facility] Adult PT Treatment/Exercise - 02/06/20 0001      Exercises   Exercises  Shoulder      Shoulder Exercises: Seated   Other Seated Exercises  table slides x  10      Shoulder Exercises: Standing   Flexion  AAROM;Right;10 reps    Flexion Limitations  using finger ladder      Shoulder Exercises: Pulleys   Flexion  3 minutes      Vasopneumatic   Number Minutes Vasopneumatic   15 minutes    Vasopnuematic Location   Shoulder    Vasopneumatic Pressure  Low    Vasopneumatic Temperature   3 snow flakes      Manual Therapy   Manual Therapy  Passive ROM    Manual therapy comments  gentle P/ROM into flexion abduction and ER to tolerance.  Occilations during movement to assist with pain               PT Short Term Goals - 01/31/20 0953      PT SHORT TERM GOAL #1   Title  independent with initial HEP    Time  4    Period  Weeks    Status  New    Target Date  02/28/20      PT SHORT TERM GOAL #2   Title  pt will improve use of Rt UE for tasks below the level of the chest to 75% without limitation    Time  4    Period  Weeks    Status  New    Target Date  02/28/20      PT SHORT TERM GOAL #3   Title  demonstrate Rt shoulder A/ROM flexion to > or = to 70 degrees  without scapular elevation to improve ability to use with ADLs    Time  4    Period  Weeks    Status  New    Target Date  02/28/20      PT SHORT TERM GOAL #4   Title  demonstrate Rt shoulder P/ROM ER to > or = to 60 degrees to improve mobility needed for self-care    Time  4    Period  Weeks    Status  New    Target Date  02/28/20        PT Long Term Goals - 01/31/20 0957      PT LONG TERM GOAL #1   Title  The patient will be independent in safe self progression of HEP  for further improvements in ROM and strength    Time  8    Period  Weeks    Status  New    Target Date  03/27/20      PT LONG TERM GOAL #2   Title  The patient will have improved right shoulder A/ROM to 120 degrees needed for reaching eye level shelves and personal care    Baseline  --    Time  8    Period  Weeks    Status  New    Target Date  03/27/20      PT LONG TERM GOAL #3   Title  demonstrate 4-/5 Rt shoulder flexion and IR to improve functional endurance for ADLs and self-care    Baseline  --    Time  8    Period  Weeks    Status  New    Target Date  03/27/20      PT LONG TERM GOAL #4   Title  demonstrate Rt shoulder ER to C5 without scapular elevation to improve use with self-care    Baseline  --    Time  8    Period  Weeks  Status  New    Target Date  03/27/20      PT LONG TERM GOAL #5   Title  demonstrate Rt shoulder A/ROM IR to L3 to improve independence and ease with dressing    Time  8    Period  Weeks    Status  New    Target Date  03/27/20      Additional Long Term Goals   Additional Long Term Goals  Yes      PT LONG TERM GOAL #6   Title  reduce FOTO to <  or = to 33% limitation    Time  8    Period  Weeks    Status  New    Target Date  03/27/20            Plan - 02/06/20 1029    Clinical Impression Statement  Pt returns today for first time follow-up after evaluation.  Pt has been consistent and independent in HEP for A/AROM to tolerance.  Pt with improved  A/AROM of the Rt shoulder and requires tactile cues for scapular depression.  Pt with increased pain with A/AROM and oscillations were used during P/ROM and pain subsided by the end of session.  Pt will continue to benefit from skilled PT for safe advancement of exercises s/p total shoulder arthroplasty.    Comorbidities  Rt TSA    Examination-Activity Limitations  Lift;Reach Overhead    Examination-Participation Restrictions  Shop;Yard Work;Cleaning    Stability/Clinical Decision Making  Stable/Uncomplicated    Clinical Decision Making  Low    Rehab Potential  Good    PT Frequency  2x / week    PT Duration  8 weeks    PT Treatment/Interventions  ADLs/Self Care Home Management;Cryotherapy;Electrical Stimulation;Moist Heat;Iontophoresis 4mg /ml Dexamethasone;Functional mobility training;Therapeutic activities;Therapeutic exercise;Neuromuscular re-education;Manual techniques;Passive range of motion;Vasopneumatic Device    PT Next Visit Plan  Follow protocol    PT Home Exercise Plan  Access Code: [email or web address]    Consulted and Agree with Plan of Care  Patient       Patient will benefit from skilled therapeutic intervention in order to improve the following deficits and impairments:     Visit Diagnosis: Muscle weakness (generalized)  Acute pain of right shoulder  Stiffness of right shoulder, not elsewhere classified  Localized edema     Problem List Patient Active Problem List   Diagnosis Date Noted  . Piriformis syndrome of left side 08/18/2019  . Rotator cuff tear, right 05/29/2019  . Closed fracture of distal clavicle 05/15/2019  . 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, [place], [place], [address] Phone: [phone]   Fax:  [phone]  Name: Cory Barnett MRN: [id number] Date of Birth: [birth year]

## 2020-02-08 ENCOUNTER — Ambulatory Visit: Payer: Medicare Other

## 2020-02-08 ENCOUNTER — Other Ambulatory Visit: Payer: Self-pay

## 2020-02-08 DIAGNOSIS — M25611 Stiffness of right shoulder, not elsewhere classified: Secondary | ICD-10-CM

## 2020-02-08 DIAGNOSIS — M6281 Muscle weakness (generalized): Secondary | ICD-10-CM | POA: Diagnosis not present

## 2020-02-08 DIAGNOSIS — M25511 Pain in right shoulder: Secondary | ICD-10-CM

## 2020-02-08 DIAGNOSIS — R600 Localized edema: Secondary | ICD-10-CM | POA: Diagnosis not present

## 2020-02-08 NOTE — Therapy (Signed)
[facility] [address], [place], [place], [address] Phone: [phone]   Fax:  [phone]  Physical Therapy Treatment  Patient Details  Name: Cory Barnett MRN: [id number] Date of Birth: August 14, 1946 Referring Provider (PT): Tania Ade, MD   Encounter Date: 02/08/2020  PT End of Session - 02/08/20 0846    Visit Number  3    Date for PT Re-Evaluation  03/27/20    Authorization Type  Medicare    Progress Note Due on Visit  10    PT Start Time  0802    PT Stop Time  0856    PT Time Calculation (min)  54 min    Activity Tolerance  Patient tolerated treatment well    Behavior During Therapy  [facility] for tasks assessed/performed       Past Medical History:  Diagnosis Date  . Anxiety   . AV block, 1st degree   . Basal cell carcinoma   . Cardiac murmur   . Cataract   . Elevated PSA measurement 2018  . Hyperlipidemia   . Hypertension 2015  . 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 IFG (impaired fasting glucose) 01/2016  . Pre-diabetes   . RBBB (right bundle branch block)   . Rheumatic fever    age 71 or 72, in hospital for a month, out of school for a yr  . Seasonal allergies   . Squamous cell carcinoma     Past Surgical History:  Procedure Laterality Date  . EYE SURGERY     right eye  cataract surgery with lens implant  . NASAL SEPTUM SURGERY    . NOSE SURGERY    . 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 SHOULDER ARTHROSCOPY WITH SUBACROMIAL DECOMPRESSION Right 08/07/2019   Procedure: SHOULDER ARTHROSCOPY WITH SUBACROMIAL DECOMPRESSION;  Surgeon: Tania Ade, MD;  Location: [place];  Service: Orthopedics;  Laterality: Right;  . SKIN CANCER EXCISION     basal and squamous cell   . TONSILLECTOMY    . VASECTOMY    . WISDOM TOOTH EXTRACTION       There were no vitals filed for this visit.  Subjective Assessment - 02/08/20 0807    Subjective  I did well after my last session.  I didn't feel too much pain.    Pertinent History  MD orders: ok for full A/AROM to tolerance.  Follow protocol.  KX at visit 15    Currently in Pain?  Yes    Pain Score  2     Pain Location  Shoulder    Pain Orientation  Right    Pain Descriptors / Indicators  Aching    Pain Type  Surgical pain    Pain Onset  1 to 4 weeks ago    Aggravating Factors   after stretching, movement of Rt UE    Pain Relieving Factors  rest, ice, Tylenol                       [facility] Adult PT Treatment/Exercise - 02/08/20 0001      Shoulder Exercises: Seated   Other Seated Exercises  table slides x 10      Shoulder Exercises: Standing   Flexion  AAROM;Right;10 reps    Flexion Limitations  using finger ladder      Shoulder Exercises:  Pulleys   Flexion  3 minutes    Scaption  3 minutes      Vasopneumatic   Number Minutes Vasopneumatic   15 minutes    Vasopnuematic Location   Shoulder    Vasopneumatic Pressure  Low    Vasopneumatic Temperature   3 snow flakes      Manual Therapy   Manual Therapy  Passive ROM    Manual therapy comments  gentle P/ROM into flexion abduction and ER to tolerance.  Occilations during movement to assist with pain               PT Short Term Goals - 01/31/20 0953      PT SHORT TERM GOAL #1   Title  independent with initial HEP    Time  4    Period  Weeks    Status  New    Target Date  02/28/20      PT SHORT TERM GOAL #2   Title  pt will improve use of Rt UE for tasks below the level of the chest to 75% without limitation    Time  4    Period  Weeks    Status  New    Target Date  02/28/20      PT SHORT TERM GOAL #3   Title  demonstrate Rt shoulder A/ROM flexion to > or = to 70 degrees without scapular elevation to improve ability to use with ADLs    Time  4    Period  Weeks    Status  New    Target  Date  02/28/20      PT SHORT TERM GOAL #4   Title  demonstrate Rt shoulder P/ROM ER to > or = to 60 degrees to improve mobility needed for self-care    Time  4    Period  Weeks    Status  New    Target Date  02/28/20        PT Long Term Goals - 01/31/20 0957      PT LONG TERM GOAL #1   Title  The patient will be independent in safe self progression of HEP  for further improvements in ROM and strength    Time  8    Period  Weeks    Status  New    Target Date  03/27/20      PT LONG TERM GOAL #2   Title  The patient will have improved right shoulder A/ROM to 120 degrees needed for reaching eye level shelves and personal care    Baseline  --    Time  8    Period  Weeks    Status  New    Target Date  03/27/20      PT LONG TERM GOAL #3   Title  demonstrate 4-/5 Rt shoulder flexion and IR to improve functional endurance for ADLs and self-care    Baseline  --    Time  8    Period  Weeks    Status  New    Target Date  03/27/20      PT LONG TERM GOAL #4   Title  demonstrate Rt shoulder ER to C5 without scapular elevation to improve use with self-care    Baseline  --    Time  8    Period  Weeks    Status  New    Target Date  03/27/20      PT LONG TERM GOAL #5  Title  demonstrate Rt shoulder A/ROM IR to L3 to improve independence and ease with dressing    Time  8    Period  Weeks    Status  New    Target Date  03/27/20      Additional Long Term Goals   Additional Long Term Goals  Yes      PT LONG TERM GOAL #6   Title  reduce FOTO to <  or = to 33% limitation    Time  8    Period  Weeks    Status  New    Target Date  03/27/20            Plan - 02/08/20 0818    Clinical Impression Statement  Pt has been consistent and independent in HEP for A/AROM to tolerance.  Pt with improved A/AROM of the Rt shoulder and requires tactile cues for scapular depression.  Pt with some increase in pain with A/AROM and P/ROM and this subsided by the end of the session. Pt is  performing all exercises per protocol at home and has been consistent with this program.  Pt will continue to benefit from skilled PT for safe advancement of exercises s/p total shoulder arthroplasty.    Stability/Clinical Decision Making  Stable/Uncomplicated    PT Frequency  2x / week    PT Duration  8 weeks    PT Treatment/Interventions  ADLs/Self Care Home Management;Cryotherapy;Electrical Stimulation;Moist Heat;Iontophoresis 4mg /ml Dexamethasone;Functional mobility training;Therapeutic activities;Therapeutic exercise;Neuromuscular re-education;Manual techniques;Passive range of motion;Vasopneumatic Device    PT Next Visit Plan  Follow protocol, A/AROM and P/ROM    PT Home Exercise Plan  Access Code: [email or web address]    Consulted and Agree with Plan of Care  Patient       Patient will benefit from skilled therapeutic intervention in order to improve the following deficits and impairments:  Decreased activity tolerance, Decreased strength, Postural dysfunction, Impaired flexibility, Decreased scar mobility, Pain, Impaired UE functional use, Increased muscle spasms, Decreased endurance  Visit Diagnosis: Muscle weakness (generalized)  Acute pain of right shoulder  Stiffness of right shoulder, not elsewhere classified  Localized edema     Problem List Patient Active Problem List   Diagnosis Date Noted  . Piriformis syndrome of left side 08/18/2019  . Rotator cuff tear, right 05/29/2019  . Closed fracture of distal clavicle 05/15/2019  . 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     Sigurd Sos, PT 02/08/20 8:48 AM  [facility] [address], [place], [place], [address] Phone: [phone]   Fax:  [phone]  Name: OCTAVIOUS HEIKE MRN: [id number] Date of Birth: Mar 18, 1946

## 2020-02-13 ENCOUNTER — Other Ambulatory Visit: Payer: Self-pay

## 2020-02-13 ENCOUNTER — Ambulatory Visit: Payer: Medicare Other | Admitting: Physical Therapy

## 2020-02-13 DIAGNOSIS — M25511 Pain in right shoulder: Secondary | ICD-10-CM

## 2020-02-13 DIAGNOSIS — R600 Localized edema: Secondary | ICD-10-CM | POA: Diagnosis not present

## 2020-02-13 DIAGNOSIS — M25611 Stiffness of right shoulder, not elsewhere classified: Secondary | ICD-10-CM

## 2020-02-13 DIAGNOSIS — M6281 Muscle weakness (generalized): Secondary | ICD-10-CM

## 2020-02-13 NOTE — Therapy (Signed)
[facility] [address], [place], [place], [address] Phone: [phone]   Fax:  [phone]  Physical Therapy Treatment  Patient Details  Name: Cory Barnett MRN: [id number] Date of Birth: [birth year] Referring Provider (PT): Tania Ade, MD   Encounter Date: 02/13/2020  PT End of Session - 02/13/20 0929    Visit Number  4    Date for PT Re-Evaluation  03/27/20    Authorization Type  Medicare    Progress Note Due on Visit  10    PT Start Time  0847    PT Stop Time  0934    PT Time Calculation (min)  47 min    Activity Tolerance  Patient tolerated treatment well       Past Medical History:  Diagnosis Date  . Anxiety   . AV block, 1st degree   . Basal cell carcinoma   . Cardiac murmur   . Cataract   . Elevated PSA measurement 2018  . Hyperlipidemia   . Hypertension 2015  . 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 IFG (impaired fasting glucose) 01/2016  . Pre-diabetes   . RBBB (right bundle branch block)   . Rheumatic fever    age 53 or 56, in hospital for a month, out of school for a yr  . Seasonal allergies   . Squamous cell carcinoma     Past Surgical History:  Procedure Laterality Date  . EYE SURGERY     right eye  cataract surgery with lens implant  . NASAL SEPTUM SURGERY    . NOSE SURGERY    . 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 SHOULDER ARTHROSCOPY WITH SUBACROMIAL DECOMPRESSION Right 08/07/2019   Procedure: SHOULDER ARTHROSCOPY WITH SUBACROMIAL DECOMPRESSION;  Surgeon: Tania Ade, MD;  Location: [facility];  Service: Orthopedics;  Laterality: Right;  . SKIN CANCER EXCISION     basal and squamous cell   . TONSILLECTOMY    . VASECTOMY    . WISDOM TOOTH EXTRACTION      There were no vitals filed for this visit.  Subjective  Assessment - 02/13/20 0853    Subjective  I accidentally rolled over on that right shoulder and it popped.  That kind of concerns me.  My left hip has been hurting more lately.  Could you print my hip stretches from before?    Currently in Pain?  Yes    Pain Score  2     Pain Location  Shoulder    Pain Orientation  Right                       [facility] Adult PT Treatment/Exercise - 02/13/20 0001      Self-Care   Self-Care  Other Self-Care Comments    Other Self-Care Comments   at patient's request reprinted hip HEP from previous course of treatment       Shoulder Exercises: Seated   Other Seated Exercises  UE Ranger on floor (modified to be lower for patient comfort )    Other Seated Exercises  review of MD protocol and discussion of progression       Shoulder Exercises: Pulleys   Flexion  3 minutes    Scaption  3 minutes      Vasopneumatic   Number Minutes Vasopneumatic   10 minutes  Vasopnuematic Location   Shoulder    Vasopneumatic Pressure  Low    Vasopneumatic Temperature   3 snow flakes      Manual Therapy   Manual Therapy  Passive ROM    Manual therapy comments  gentle P/ROM into flexion abduction and ER to tolerance.  Occilations during movement to assist with pain             PT Education - 02/13/20 0928    Education Details  hip stretches and strengthening from previous PT    Person(s) Educated  Patient    Methods  Explanation;Demonstration;Handout    Comprehension  Returned demonstration;Verbalized understanding       PT Short Term Goals - 01/31/20 0953      PT SHORT TERM GOAL #1   Title  independent with initial HEP    Time  4    Period  Weeks    Status  New    Target Date  02/28/20      PT SHORT TERM GOAL #2   Title  pt will improve use of Rt UE for tasks below the level of the chest to 75% without limitation    Time  4    Period  Weeks    Status  New    Target Date  02/28/20      PT SHORT TERM GOAL #3   Title  demonstrate Rt  shoulder A/ROM flexion to > or = to 70 degrees without scapular elevation to improve ability to use with ADLs    Time  4    Period  Weeks    Status  New    Target Date  02/28/20      PT SHORT TERM GOAL #4   Title  demonstrate Rt shoulder P/ROM ER to > or = to 60 degrees to improve mobility needed for self-care    Time  4    Period  Weeks    Status  New    Target Date  02/28/20        PT Long Term Goals - 01/31/20 0957      PT LONG TERM GOAL #1   Title  The patient will be independent in safe self progression of HEP  for further improvements in ROM and strength    Time  8    Period  Weeks    Status  New    Target Date  03/27/20      PT LONG TERM GOAL #2   Title  The patient will have improved right shoulder A/ROM to 120 degrees needed for reaching eye level shelves and personal care    Baseline  --    Time  8    Period  Weeks    Status  New    Target Date  03/27/20      PT LONG TERM GOAL #3   Title  demonstrate 4-/5 Rt shoulder flexion and IR to improve functional endurance for ADLs and self-care    Baseline  --    Time  8    Period  Weeks    Status  New    Target Date  03/27/20      PT LONG TERM GOAL #4   Title  demonstrate Rt shoulder ER to C5 without scapular elevation to improve use with self-care    Baseline  --    Time  8    Period  Weeks    Status  New    Target Date  03/27/20  PT LONG TERM GOAL #5   Title  demonstrate Rt shoulder A/ROM IR to L3 to improve independence and ease with dressing    Time  8    Period  Weeks    Status  New    Target Date  03/27/20      Additional Long Term Goals   Additional Long Term Goals  Yes      PT LONG TERM GOAL #6   Title  reduce FOTO to <  or = to 33% limitation    Time  8    Period  Weeks    Status  New    Target Date  03/27/20            Plan - 02/13/20 [id number]    Clinical Impression Statement  The patient is progressing as expected from length of time since surgery and type of surgery.  Passively  he has excellent ROM but pain with lowering UE from elevated position.  Discussed sleeping positions  and left hip exercises secondary to hip pain exacerbation from sleeping on left side since he can't sleep on right shoulder post surgical side.  Good pain reducation with vasocompression.  Therapist monitoring response with all interventions.  No compensatory shoulder shrug noted.    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    Rehab Potential  Good    PT Frequency  2x / week    PT Duration  8 weeks    PT Treatment/Interventions  ADLs/Self Care Home Management;Cryotherapy;Electrical Stimulation;Moist Heat;Iontophoresis 4mg /ml Dexamethasone;Functional mobility training;Therapeutic activities;Therapeutic exercise;Neuromuscular re-education;Manual techniques;Passive range of motion;Vasopneumatic Device    PT Next Visit Plan  Follow protocol next progression on 4/22, A/AROM and P/ROM    PT Home Exercise Plan  Access Code: [email or web address]       Patient will benefit from skilled therapeutic intervention in order to improve the following deficits and impairments:  Decreased activity tolerance, Decreased strength, Postural dysfunction, Impaired flexibility, Decreased scar mobility, Pain, Impaired UE functional use, Increased muscle spasms, Decreased endurance  Visit Diagnosis: Muscle weakness (generalized)  Acute pain of right shoulder  Stiffness of right shoulder, not elsewhere classified     Problem List Patient Active Problem List   Diagnosis Date Noted  . Piriformis syndrome of left side 08/18/2019  . Rotator cuff tear, right 05/29/2019  . Closed fracture of distal clavicle 05/15/2019  . 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   [name], PT 02/13/20 9:49 AM Phone: [phone] Fax: [phone] [name] 02/13/2020, 9:49 AM  [facility] [address] [place], [place], [address] Phone: [phone]   Fax:   [phone]  Name: Cory WISSMAN MRN: [id number] Date of Birth: 1946-08-09

## 2020-02-13 NOTE — Patient Instructions (Signed)
Access Code: [email or web address] URL: [email or web address] Date: 02/13/2020 Prepared by: [name]  Exercises for hip from previous PT:   Seated Piriformis Stretch with Trunk Bend - 1 x daily - 7 x weekly - 3 reps - 1 sets - 30 hold Seated Hamstring Stretch - 1 x daily - 7 x weekly - 3 reps - 1 sets - 30 hold Hip Extension with Resistance Loop - 1 x daily - 7 x weekly - 10 reps - 1 sets Hip Abduction with Resistance Loop - 1 x daily - 7 x weekly - 10 reps - 1 sets Standing Hip Abduction with Anchored Resistance - 1 x daily - 7 x weekly - 10 reps - 1 sets Supine Single Knee to Chest Stretch - 1 x daily - 7 x weekly - 1 sets - 3 reps - 30 hold Supine Piriformis Stretch with Leg Straight - 1 x daily - 7 x weekly - 1 sets - 3 reps - 30 hold

## 2020-02-15 ENCOUNTER — Ambulatory Visit: Payer: Medicare Other | Admitting: Physical Therapy

## 2020-02-15 ENCOUNTER — Other Ambulatory Visit: Payer: Self-pay

## 2020-02-15 DIAGNOSIS — M25611 Stiffness of right shoulder, not elsewhere classified: Secondary | ICD-10-CM | POA: Diagnosis not present

## 2020-02-15 DIAGNOSIS — M6281 Muscle weakness (generalized): Secondary | ICD-10-CM

## 2020-02-15 DIAGNOSIS — M25511 Pain in right shoulder: Secondary | ICD-10-CM

## 2020-02-15 DIAGNOSIS — R600 Localized edema: Secondary | ICD-10-CM | POA: Diagnosis not present

## 2020-02-15 NOTE — Therapy (Signed)
[facility] [address], [place], [place], [address] Phone: [phone]   Fax:  [phone]  Physical Therapy Treatment  Patient Details  Name: Cory Barnett MRN: [id number] Date of Birth: 01-04-1946 Referring Provider (PT): Tania Ade, MD   Encounter Date: 02/15/2020  PT End of Session - 02/15/20 1230    Visit Number  5    Date for PT Re-Evaluation  03/27/20    Authorization Type  Medicare    Progress Note Due on Visit  10    PT Start Time  1147    PT Stop Time  1237    PT Time Calculation (min)  50 min    Activity Tolerance  Patient limited by pain       Past Medical History:  Diagnosis Date  . Anxiety   . AV block, 1st degree   . Basal cell carcinoma   . Cardiac murmur   . Cataract   . Elevated PSA measurement 2018  . Hyperlipidemia   . Hypertension 2015  . 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 IFG (impaired fasting glucose) 01/2016  . Pre-diabetes   . RBBB (right bundle branch block)   . Rheumatic fever    age 29 or 61, in hospital for a month, out of school for a yr  . Seasonal allergies   . Squamous cell carcinoma     Past Surgical History:  Procedure Laterality Date  . EYE SURGERY     right eye  cataract surgery with lens implant  . NASAL SEPTUM SURGERY    . NOSE SURGERY    . 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 SHOULDER ARTHROSCOPY WITH SUBACROMIAL DECOMPRESSION Right 08/07/2019   Procedure: SHOULDER ARTHROSCOPY WITH SUBACROMIAL DECOMPRESSION;  Surgeon: Tania Ade, MD;  Location: [facility];  Service: Orthopedics;  Laterality: Right;  . SKIN CANCER EXCISION     basal and squamous cell   . TONSILLECTOMY    . VASECTOMY    . WISDOM TOOTH EXTRACTION      There were no vitals filed for this visit.  Subjective Assessment -  02/15/20 1152    Subjective  I've been really hurting over the last 2 days.  It popped again when I rolled over in bed.  I'm taking 3 Advil 3x/day and that's too much.  I'm going to call the doctor about something different I can take.     Pertinent History  MD orders: ok for full A/AROM to tolerance.  Follow protocol.  KX at visit 15    Currently in Pain?  Yes    Pain Score  3     Pain Location  Shoulder    Pain Orientation  Right         [facility] PT Assessment - 02/15/20 0001      Observation/Other Assessments   Observations  mild peri-incisional redness discussed with patient to observe daily for further reddening and to call the doctor if this worsens                    [facility] Adult PT Treatment/Exercise - 02/15/20 0001      Self-Care   Other Self-Care Comments   long discussion of increased pain level:  discussed decreased frequency and repetition of ROM ex's;  discussed calling MD regarding popping/increased pain recently  Shoulder Exercises: Supine   Other Supine Exercises  clasped hands active assisted flexion 10x       Shoulder Exercises: Standing   Other Standing Exercises  UE Ranger on 1st step 10x discontinued as it became more painful       Vasopneumatic   Number Minutes Vasopneumatic   15 minutes    Vasopnuematic Location   Shoulder    Vasopneumatic Pressure  Low    Vasopneumatic Temperature   3 snow flakes      Manual Therapy   Manual therapy comments  abduction 25x, external rotation gentle 25x. elbow flexion/extension 20x     Kinesiotex  Create Space      Kinesiotix   Create Space  deltoid unloading 3 strips    posterior and lateral strips;  avoided tape near incision              PT Short Term Goals - 01/31/20 0953      PT SHORT TERM GOAL #1   Title  independent with initial HEP    Time  4    Period  Weeks    Status  New    Target Date  02/28/20      PT SHORT TERM GOAL #2   Title  pt will improve use of Rt UE for tasks below  the level of the chest to 75% without limitation    Time  4    Period  Weeks    Status  New    Target Date  02/28/20      PT SHORT TERM GOAL #3   Title  demonstrate Rt shoulder A/ROM flexion to > or = to 70 degrees without scapular elevation to improve ability to use with ADLs    Time  4    Period  Weeks    Status  New    Target Date  02/28/20      PT SHORT TERM GOAL #4   Title  demonstrate Rt shoulder P/ROM ER to > or = to 60 degrees to improve mobility needed for self-care    Time  4    Period  Weeks    Status  New    Target Date  02/28/20        PT Long Term Goals - 01/31/20 0957      PT LONG TERM GOAL #1   Title  The patient will be independent in safe self progression of HEP  for further improvements in ROM and strength    Time  8    Period  Weeks    Status  New    Target Date  03/27/20      PT LONG TERM GOAL #2   Title  The patient will have improved right shoulder A/ROM to 120 degrees needed for reaching eye level shelves and personal care    Baseline  --    Time  8    Period  Weeks    Status  New    Target Date  03/27/20      PT LONG TERM GOAL #3   Title  demonstrate 4-/5 Rt shoulder flexion and IR to improve functional endurance for ADLs and self-care    Baseline  --    Time  8    Period  Weeks    Status  New    Target Date  03/27/20      PT LONG TERM GOAL #4   Title  demonstrate Rt shoulder ER to C5 without scapular elevation  to improve use with self-care    Baseline  --    Time  8    Period  Weeks    Status  New    Target Date  03/27/20      PT LONG TERM GOAL #5   Title  demonstrate Rt shoulder A/ROM IR to L3 to improve independence and ease with dressing    Time  8    Period  Weeks    Status  New    Target Date  03/27/20      Additional Long Term Goals   Additional Long Term Goals  Yes      PT LONG TERM GOAL #6   Title  reduce FOTO to <  or = to 33% limitation    Time  8    Period  Weeks    Status  New    Target Date  03/27/20             Plan - 02/15/20 1305    Clinical Impression Statement  The patient complains of an increase in shoulder pain since last Friday.  He is also concerned about 3 episodes of his shoulder "popping" when he turns over in bed.  He is quite painful with even gentle ROM therefore treatment modified with fewer reps and number of ex's.  Discussed decreasing frequency and intensity of self ROM at home since he is not especially stiff.  He plans to call the doctor about anti inflammatory med and to consult regarding the popping sensation he has experienced.  Very mild peri-incisional redness but no increased heat or swelling noted.  Advised to have his wife or daughter check the incision this weekend for any changes in redness or signs of swelling, drainage for increased skin temp.  Applied kinesiotape for pain control and to improve lymphatic flow.    Rehab Potential  Good    PT Frequency  2x / week    PT Duration  8 weeks    PT Treatment/Interventions  ADLs/Self Care Home Management;Cryotherapy;Electrical Stimulation;Moist Heat;Iontophoresis 4mg /ml Dexamethasone;Functional mobility training;Therapeutic activities;Therapeutic exercise;Neuromuscular re-education;Manual techniques;Passive range of motion;Vasopneumatic Device    PT Next Visit Plan  low level/limited ROM;  vaso for pain control;  assess response to KT    PT Home Exercise Plan  Access Code: [email or web address]       Patient will benefit from skilled therapeutic intervention in order to improve the following deficits and impairments:  Decreased activity tolerance, Decreased strength, Postural dysfunction, Impaired flexibility, Decreased scar mobility, Pain, Impaired UE functional use, Increased muscle spasms, Decreased endurance  Visit Diagnosis: Muscle weakness (generalized)  Acute pain of right shoulder  Stiffness of right shoulder, not elsewhere classified  Localized edema     Problem List Patient Active Problem List   Diagnosis  Date Noted  . Piriformis syndrome of left side 08/18/2019  . Rotator cuff tear, right 05/29/2019  . Closed fracture of distal clavicle 05/15/2019  . 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   [name], PT 02/15/20 7:32 PM Phone: [phone] Fax: [phone] [name] 02/15/2020, 7:32 PM  [facility] [address], [place], [place], [address] Phone: [phone]   Fax:  [phone]  Name: Cory Barnett MRN: [id number] Date of Birth: 11-Mar-1946

## 2020-02-16 DIAGNOSIS — Z1152 Encounter for screening for COVID-19: Secondary | ICD-10-CM | POA: Diagnosis not present

## 2020-02-16 DIAGNOSIS — M791 Myalgia, unspecified site: Secondary | ICD-10-CM | POA: Diagnosis not present

## 2020-02-16 DIAGNOSIS — M2550 Pain in unspecified joint: Secondary | ICD-10-CM | POA: Diagnosis not present

## 2020-02-16 DIAGNOSIS — R509 Fever, unspecified: Secondary | ICD-10-CM | POA: Diagnosis not present

## 2020-02-19 DIAGNOSIS — R509 Fever, unspecified: Secondary | ICD-10-CM | POA: Diagnosis not present

## 2020-02-19 DIAGNOSIS — M75101 Unspecified rotator cuff tear or rupture of right shoulder, not specified as traumatic: Secondary | ICD-10-CM | POA: Diagnosis not present

## 2020-02-20 ENCOUNTER — Other Ambulatory Visit: Payer: Self-pay

## 2020-02-20 ENCOUNTER — Ambulatory Visit: Payer: Medicare Other | Admitting: Physical Therapy

## 2020-02-20 DIAGNOSIS — R509 Fever, unspecified: Secondary | ICD-10-CM | POA: Diagnosis not present

## 2020-02-20 DIAGNOSIS — J309 Allergic rhinitis, unspecified: Secondary | ICD-10-CM | POA: Diagnosis not present

## 2020-02-20 DIAGNOSIS — M6281 Muscle weakness (generalized): Secondary | ICD-10-CM | POA: Diagnosis not present

## 2020-02-20 DIAGNOSIS — M25611 Stiffness of right shoulder, not elsewhere classified: Secondary | ICD-10-CM | POA: Diagnosis not present

## 2020-02-20 DIAGNOSIS — M2550 Pain in unspecified joint: Secondary | ICD-10-CM | POA: Diagnosis not present

## 2020-02-20 DIAGNOSIS — R600 Localized edema: Secondary | ICD-10-CM | POA: Diagnosis not present

## 2020-02-20 DIAGNOSIS — M25511 Pain in right shoulder: Secondary | ICD-10-CM | POA: Diagnosis not present

## 2020-02-20 DIAGNOSIS — Z96611 Presence of right artificial shoulder joint: Secondary | ICD-10-CM | POA: Diagnosis not present

## 2020-02-20 NOTE — Patient Instructions (Signed)
Access Code: [email or web address] URL: [email or web address] Date: 02/20/2020 Prepared by: [name]  Exercises Seated Gripping Towel - 3 x daily - 7 x weekly - 10 reps - 2 sets Circular Shoulder Pendulum with Table Support - 3 x daily - 7 x weekly - 10 reps - 1 sets Seated Shoulder Shrugs - 3 x daily - 7 x weekly - 10 reps - 2 sets Supine Shoulder Flexion AAROM with Hands Clasped - 3 x daily - 7 x weekly - 10 reps - 1 sets - 10 hold Seated Shoulder Flexion AAROM with Pulley Behind - 3 x daily - 7 x weekly Supine Shoulder Press AAROM in Abduction with Dowel - 2 x daily - 7 x weekly - 10 sets - 10 reps Isometric Shoulder Flexion at Wall - 1 x daily - 7 x weekly - 1 sets - 5 reps - 5 hold Isometric Shoulder Abduction at Wall - 1 x daily - 7 x weekly - 1 sets - 5 reps - 5 hold Isometric Shoulder Extension at Wall - 1 x daily - 7 x weekly - 1 sets - 5 reps - 5 hold Standing Shoulder Posterior Capsule Stretch - 1 x daily - 7 x weekly - 1 sets - 5 reps

## 2020-02-20 NOTE — Therapy (Signed)
[facility] [address] [place], [place], [address] Phone: [phone]   Fax:  [phone]  Physical Therapy Treatment  Patient Details  Name: Cory Barnett MRN: [id number] Date of Birth: 04-16-1946 Referring Provider (PT): Tania Ade, MD   Encounter Date: 02/20/2020  PT End of Session - 02/20/20 1244    Visit Number  6    Date for PT Re-Evaluation  03/27/20    Authorization Type  Medicare    Progress Note Due on Visit  10    PT Start Time  1059    PT Stop Time  1150    PT Time Calculation (min)  51 min       Past Medical History:  Diagnosis Date  . Anxiety   . AV block, 1st degree   . Basal cell carcinoma   . Cardiac murmur   . Cataract   . Elevated PSA measurement 2018  . Hyperlipidemia   . Hypertension 2015  . 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 IFG (impaired fasting glucose) 01/2016  . Pre-diabetes   . RBBB (right bundle branch block)   . Rheumatic fever    age 40 or 65, in hospital for a month, out of school for a yr  . Seasonal allergies   . Squamous cell carcinoma     Past Surgical History:  Procedure Laterality Date  . EYE SURGERY     right eye  cataract surgery with lens implant  . NASAL SEPTUM SURGERY    . NOSE SURGERY    . 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 SHOULDER ARTHROSCOPY WITH SUBACROMIAL DECOMPRESSION Right 08/07/2019   Procedure: SHOULDER ARTHROSCOPY WITH SUBACROMIAL DECOMPRESSION;  Surgeon: Tania Ade, MD;  Location: [facility];  Service: Orthopedics;  Laterality: Right;  . SKIN CANCER EXCISION     basal and squamous cell   . TONSILLECTOMY    . VASECTOMY    . WISDOM TOOTH EXTRACTION      There were no vitals filed for this visit.  Subjective Assessment - 02/20/20 1101    Subjective  The doctor cleared  me to begin phase 2.  I've got a report in the car.  I saw the doctor and he x-rayed everything and he said there is no infection.  It is slipping it out joint a little and he said there is more space than normal and that is slipping. He thinks strengthening will help shore that up.  If not he could change that insert surgically.  He said I could lift overhead.    Pertinent History  MD orders:  on 4/20 pt reports "OK to move on to Phase 2, OK for strenghthening"  new order received;  ok for full A/AROM to tolerance.  Follow protocol.  KX at visit 15    Currently in Pain?  No/denies    Pain Score  0-No pain    Pain Location  Shoulder    Pain Orientation  Right                       [facility] Adult PT Treatment/Exercise - 02/20/20 0001      Shoulder Exercises: Standing   External Rotation  Strengthening;Right;10 reps;Theraband    Theraband Level (Shoulder External Rotation)  Level 1 (Yellow)    External Rotation Limitations  small ROM  almost isometric     Internal Rotation  Strengthening;Right;10 reps;Theraband    Internal Rotation Limitations  very small ROM almost isometric     Extension  Strengthening;Right;10 reps;Theraband    Theraband Level (Shoulder Extension)  Level 1 (Yellow)    Row  Strengthening;Right;10 reps;Theraband    Theraband Level (Shoulder Row)  Level 1 (Yellow)      Shoulder Exercises: Pulleys   Flexion  3 minutes      Shoulder Exercises: ROM/Strengthening   Other ROM/Strengthening Exercises  standing in front of counter rocking side to side and bending knees for internal rotation ROM     Other ROM/Strengthening Exercises  cross body stretch 5x       Shoulder Exercises: Isometric Strengthening   Flexion  5X5"    Extension  5X5"    ABduction  5X5"      Vasopneumatic   Number Minutes Vasopneumatic   15 minutes    Vasopnuematic Location   Shoulder    Vasopneumatic Pressure  Low    Vasopneumatic Temperature   3 snow flakes             PT  Education - 02/20/20 1243    Education Details  Access Code: [email or web address]  isometrics;  yellow band rows, extensions, cross body; counter weight shifts for internal rotation    Person(s) Educated  Patient    Methods  Explanation;Demonstration;Handout    Comprehension  Returned demonstration;Verbalized understanding       PT Short Term Goals - 01/31/20 0953      PT SHORT TERM GOAL #1   Title  independent with initial HEP    Time  4    Period  Weeks    Status  New    Target Date  02/28/20      PT SHORT TERM GOAL #2   Title  pt will improve use of Rt UE for tasks below the level of the chest to 75% without limitation    Time  4    Period  Weeks    Status  New    Target Date  02/28/20      PT SHORT TERM GOAL #3   Title  demonstrate Rt shoulder A/ROM flexion to > or = to 70 degrees without scapular elevation to improve ability to use with ADLs    Time  4    Period  Weeks    Status  New    Target Date  02/28/20      PT SHORT TERM GOAL #4   Title  demonstrate Rt shoulder P/ROM ER to > or = to 60 degrees to improve mobility needed for self-care    Time  4    Period  Weeks    Status  New    Target Date  02/28/20        PT Long Term Goals - 01/31/20 0957      PT LONG TERM GOAL #1   Title  The patient will be independent in safe self progression of HEP  for further improvements in ROM and strength    Time  8    Period  Weeks    Status  New    Target Date  03/27/20      PT LONG TERM GOAL #2   Title  The patient will have improved right shoulder A/ROM to 120 degrees needed for reaching eye level shelves and personal care    Baseline  --    Time  8    Period  Weeks    Status  New    Target Date  03/27/20      PT LONG TERM GOAL #3   Title  demonstrate 4-/5 Rt shoulder flexion and IR to improve functional endurance for ADLs and self-care    Baseline  --    Time  8    Period  Weeks    Status  New    Target Date  03/27/20      PT LONG TERM GOAL #4   Title  demonstrate Rt  shoulder ER to C5 without scapular elevation to improve use with self-care    Baseline  --    Time  8    Period  Weeks    Status  New    Target Date  03/27/20      PT LONG TERM GOAL #5   Title  demonstrate Rt shoulder A/ROM IR to L3 to improve independence and ease with dressing    Time  8    Period  Weeks    Status  New    Target Date  03/27/20      Additional Long Term Goals   Additional Long Term Goals  Yes      PT LONG TERM GOAL #6   Title  reduce FOTO to <  or = to 33% limitation    Time  8    Period  Weeks    Status  New    Target Date  03/27/20            Plan - 02/20/20 1243    Clinical Impression Statement  The patient has much improved exercise tolerance compared to last visit secondary to lower pain intensity.  He is encouraged by the doctor's release to initiate strengthening now and fewer restrictions overall.  He is hopeful that strengthening will help with stability issue when turning over in bed and avoid any more surgeries.  Therapist closely monitoring response with all exercises.  Internal and external rotation kept at a very small ROM.  Vasocompression for pain control and to help with mild shoulder swelling.    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    Rehab Potential  Good    PT Frequency  2x / week    PT Duration  8 weeks    PT Treatment/Interventions  ADLs/Self Care Home Management;Cryotherapy;Electrical Stimulation;Moist Heat;Iontophoresis 4mg /ml Dexamethasone;Functional mobility training;Therapeutic activities;Therapeutic exercise;Neuromuscular re-education;Manual techniques;Passive range of motion;Vasopneumatic Device    PT Next Visit Plan  low level strengthening "phase 2" and ROM; scapular strengthening;  check STGS    PT Home Exercise Plan  Access Code: [email or web address]       Patient will benefit from skilled therapeutic intervention in order to improve the following deficits and impairments:  Decreased activity tolerance, Decreased strength, Postural  dysfunction, Impaired flexibility, Decreased scar mobility, Pain, Impaired UE functional use, Increased muscle spasms, Decreased endurance  Visit Diagnosis: Muscle weakness (generalized)  Acute pain of right shoulder  Stiffness of right shoulder, not elsewhere classified  Localized edema     Problem List Patient Active Problem List   Diagnosis Date Noted  . Piriformis syndrome of left side 08/18/2019  . Rotator cuff tear, right 05/29/2019  . Closed fracture of distal clavicle 05/15/2019  . 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 [place], [place], [address] Phone: [phone]   Fax:  [phone]  Name: Cory Barnett MRN: [id number] Date of Birth: 07-20-1946

## 2020-02-22 ENCOUNTER — Other Ambulatory Visit: Payer: Self-pay

## 2020-02-22 ENCOUNTER — Encounter: Payer: Self-pay | Admitting: Physical Therapy

## 2020-02-22 ENCOUNTER — Ambulatory Visit: Payer: Medicare Other | Admitting: Physical Therapy

## 2020-02-22 DIAGNOSIS — M2550 Pain in unspecified joint: Secondary | ICD-10-CM | POA: Diagnosis not present

## 2020-02-22 DIAGNOSIS — E663 Overweight: Secondary | ICD-10-CM | POA: Diagnosis not present

## 2020-02-22 DIAGNOSIS — R600 Localized edema: Secondary | ICD-10-CM | POA: Diagnosis not present

## 2020-02-22 DIAGNOSIS — M6281 Muscle weakness (generalized): Secondary | ICD-10-CM

## 2020-02-22 DIAGNOSIS — M25511 Pain in right shoulder: Secondary | ICD-10-CM

## 2020-02-22 DIAGNOSIS — M25611 Stiffness of right shoulder, not elsewhere classified: Secondary | ICD-10-CM

## 2020-02-22 DIAGNOSIS — Z6827 Body mass index (BMI) 27.0-27.9, adult: Secondary | ICD-10-CM | POA: Diagnosis not present

## 2020-02-22 NOTE — Therapy (Signed)
[facility] [address], [place], [place], [address] Phone: [phone]   Fax:  [phone]  Physical Therapy Treatment  Patient Details  Name: Cory Barnett MRN: [id number] Date of Birth: 1946/03/04 Referring Provider (PT): Tania Ade, MD   Encounter Date: 02/22/2020  PT End of Session - 02/22/20 0836    Visit Number  7    Date for PT Re-Evaluation  03/27/20    Authorization Type  Medicare    Progress Note Due on Visit  10    PT Start Time  0803    PT Stop Time  0845    PT Time Calculation (min)  42 min    Activity Tolerance  Patient tolerated treatment well       Past Medical History:  Diagnosis Date  . Anxiety   . AV block, 1st degree   . Basal cell carcinoma   . Cardiac murmur   . Cataract   . Elevated PSA measurement 2018  . Hyperlipidemia   . Hypertension 2015  . 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 IFG (impaired fasting glucose) 01/2016  . Pre-diabetes   . RBBB (right bundle branch block)   . Rheumatic fever    age 62 or 89, in hospital for a month, out of school for a yr  . Seasonal allergies   . Squamous cell carcinoma     Past Surgical History:  Procedure Laterality Date  . EYE SURGERY     right eye  cataract surgery with lens implant  . NASAL SEPTUM SURGERY    . NOSE SURGERY    . 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 SHOULDER ARTHROSCOPY WITH SUBACROMIAL DECOMPRESSION Right 08/07/2019   Procedure: SHOULDER ARTHROSCOPY WITH SUBACROMIAL DECOMPRESSION;  Surgeon: Tania Ade, MD;  Location: [facility];  Service: Orthopedics;  Laterality: Right;  . SKIN CANCER EXCISION     basal and squamous cell   . TONSILLECTOMY    . VASECTOMY    . WISDOM TOOTH EXTRACTION      There were no vitals filed for this visit.  Subjective  Assessment - 02/22/20 0805    Subjective  It's doing better.  Pain level 2/10.    Pertinent History  MD orders:  on 4/20 pt reports "OK to move on to Phase 2, OK for strenghthening"  new order received;  ok for full A/AROM to tolerance.  Follow protocol.  KX at visit 15    Currently in Pain?  Yes    Pain Score  0-No pain    Pain Location  Shoulder    Pain Orientation  Right                       [facility] Adult PT Treatment/Exercise - 02/22/20 0001      Shoulder Exercises: Supine   Other Supine Exercises  salutes bent elbow to forehead 5x     Other Supine Exercises  towel press toward ceiling 10x       Shoulder Exercises: Seated   Other Seated Exercises  cane elevation 10x       Shoulder Exercises: Standing   Extension  Strengthening;Right;10 reps;Theraband    Theraband Level (Shoulder Extension)  Level 1 (Yellow)    Row  Strengthening;Right;10 reps;Theraband    Theraband Level (Shoulder Row)  Level 1 (Yellow)  Other Standing Exercises  bicep curls 2# 10x       Shoulder Exercises: Isometric Strengthening   Flexion  5X5"    ABduction  5X5"      Vasopneumatic   Number Minutes Vasopneumatic   15 minutes    Vasopnuematic Location   Shoulder    Vasopneumatic Pressure  Low    Vasopneumatic Temperature   3 snow flakes      Manual Therapy   Manual therapy comments  gentle rhythmic stab at 90 degrees 1 minute                PT Short Term Goals - 02/22/20 0954      PT SHORT TERM GOAL #1   Title  independent with initial HEP    Status  Achieved      PT SHORT TERM GOAL #2   Title  pt will improve use of Rt UE for tasks below the level of the chest to 75% without limitation    Time  4    Period  Weeks    Status  Achieved      PT SHORT TERM GOAL #3   Title  demonstrate Rt shoulder A/ROM flexion to > or = to 70 degrees without scapular elevation to improve ability to use with ADLs    Status  Achieved      PT SHORT TERM GOAL #4   Title  demonstrate Rt  shoulder P/ROM ER to > or = to 60 degrees to improve mobility needed for self-care    Status  Achieved        PT Long Term Goals - 01/31/20 0957      PT LONG TERM GOAL #1   Title  The patient will be independent in safe self progression of HEP  for further improvements in ROM and strength    Time  8    Period  Weeks    Status  New    Target Date  03/27/20      PT LONG TERM GOAL #2   Title  The patient will have improved right shoulder A/ROM to 120 degrees needed for reaching eye level shelves and personal care    Baseline  --    Time  8    Period  Weeks    Status  New    Target Date  03/27/20      PT LONG TERM GOAL #3   Title  demonstrate 4-/5 Rt shoulder flexion and IR to improve functional endurance for ADLs and self-care    Baseline  --    Time  8    Period  Weeks    Status  New    Target Date  03/27/20      PT LONG TERM GOAL #4   Title  demonstrate Rt shoulder ER to C5 without scapular elevation to improve use with self-care    Baseline  --    Time  8    Period  Weeks    Status  New    Target Date  03/27/20      PT LONG TERM GOAL #5   Title  demonstrate Rt shoulder A/ROM IR to L3 to improve independence and ease with dressing    Time  8    Period  Weeks    Status  New    Target Date  03/27/20      Additional Long Term Goals   Additional Long Term Goals  Yes      PT  LONG TERM GOAL #6   Title  reduce FOTO to <  or = to 33% limitation    Time  8    Period  Weeks    Status  New    Target Date  03/27/20            Plan - 02/22/20 0951    Clinical Impression Statement  Much improved pain intensity level compared to last week.  He is able to perform active ROM and assisted ROM in supine and seated positions with muscular fatigue at 5-8 repetitions but no exacerbation of pain.  He reports some "popping" with row motion (no hyperextension).  He is eager to start bicep curls with weight at home.  Discussed light resistance only and lower repetitions to start.   Therapist monitoring response throughout treatment session.  All STGs met.    Rehab Potential  Good    PT Frequency  2x / week    PT Duration  8 weeks    PT Treatment/Interventions  ADLs/Self Care Home Management;Cryotherapy;Electrical Stimulation;Moist Heat;Iontophoresis 23m/ml Dexamethasone;Functional mobility training;Therapeutic activities;Therapeutic exercise;Neuromuscular re-education;Manual techniques;Passive range of motion;Vasopneumatic Device    PT Next Visit Plan  low level strengthening "phase 2" and ROM; scapular strengthening       Patient will benefit from skilled therapeutic intervention in order to improve the following deficits and impairments:  Decreased activity tolerance, Decreased strength, Postural dysfunction, Impaired flexibility, Decreased scar mobility, Pain, Impaired UE functional use, Increased muscle spasms, Decreased endurance  Visit Diagnosis: Muscle weakness (generalized)  Acute pain of right shoulder  Stiffness of right shoulder, not elsewhere classified  Localized edema     Problem List Patient Active Problem List   Diagnosis Date Noted  . Piriformis syndrome of left side 08/18/2019  . Rotator cuff tear, right 05/29/2019  . Closed fracture of distal clavicle 05/15/2019  . 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   SRuben Im PT 02/22/20 9:57 AM Phone: [phone]: [phone] Singh4/22/2021, 9:57 AM  [facility] [address] [place] [address]Phone: [phone]  Fax:  [phone] Name: Cory KARNERMRN: [id number] of Birth: 919-Aug-1947

## 2020-02-27 ENCOUNTER — Other Ambulatory Visit: Payer: Self-pay

## 2020-02-27 ENCOUNTER — Ambulatory Visit: Payer: Medicare Other | Admitting: Physical Therapy

## 2020-02-27 DIAGNOSIS — M25611 Stiffness of right shoulder, not elsewhere classified: Secondary | ICD-10-CM

## 2020-02-27 DIAGNOSIS — M6281 Muscle weakness (generalized): Secondary | ICD-10-CM | POA: Diagnosis not present

## 2020-02-27 DIAGNOSIS — M25511 Pain in right shoulder: Secondary | ICD-10-CM

## 2020-02-27 DIAGNOSIS — R600 Localized edema: Secondary | ICD-10-CM

## 2020-02-27 NOTE — Therapy (Signed)
[facility] [address] [place], [place], [address] Phone: [phone]   Fax:  [phone]  Physical Therapy Treatment  Patient Details  Name: Cory Barnett MRN: [id number] Date of Birth: 03-Oct-1946 Referring Provider (PT): Tania Ade, MD   Encounter Date: 02/27/2020  PT End of Session - 02/27/20 0935    Visit Number  8    Date for PT Re-Evaluation  03/27/20    Authorization Type  Medicare    Progress Note Due on Visit  10    PT Start Time  0845    PT Stop Time  0930    PT Time Calculation (min)  45 min    Activity Tolerance  Patient tolerated treatment well       Past Medical History:  Diagnosis Date  . Anxiety   . AV block, 1st degree   . Basal cell carcinoma   . Cardiac murmur   . Cataract   . Elevated PSA measurement 2018  . Hyperlipidemia   . Hypertension 2015  . 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 IFG (impaired fasting glucose) 01/2016  . Pre-diabetes   . RBBB (right bundle branch block)   . Rheumatic fever    age 74 or 76, in hospital for a month, out of school for a yr  . Seasonal allergies   . Squamous cell carcinoma     Past Surgical History:  Procedure Laterality Date  . EYE SURGERY     right eye  cataract surgery with lens implant  . NASAL SEPTUM SURGERY    . NOSE SURGERY    . 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 SHOULDER ARTHROSCOPY WITH SUBACROMIAL DECOMPRESSION Right 08/07/2019   Procedure: SHOULDER ARTHROSCOPY WITH SUBACROMIAL DECOMPRESSION;  Surgeon: Tania Ade, MD;  Location: [facility];  Service: Orthopedics;  Laterality: Right;  . SKIN CANCER EXCISION     basal and squamous cell   . TONSILLECTOMY    . VASECTOMY    . WISDOM TOOTH EXTRACTION      There were no vitals filed for this visit.  Subjective  Assessment - 02/27/20 0941    Subjective  I need your help with a problem.  My shoulder keeps moving out of place.  The doctor said if strengthening doesn't help, he could put in a different size spacer but I don't want that if possible.    Pertinent History  MD orders:  on 4/20 pt reports "OK to move on to Phase 2, OK for strenghthening"  new order received;  ok for full A/AROM to tolerance.  Follow protocol.  KX at visit 15    Currently in Pain?  No/denies    Pain Score  0-No pain    Pain Location  Shoulder    Pain Orientation  Right                       [facility] Adult PT Treatment/Exercise - 02/27/20 0001      Shoulder Exercises: Supine   Flexion  Strengthening;Right;5 reps;Weights    Shoulder Flexion Weight (lbs)  1    Other Supine Exercises  salutes bent elbow to forehead 5x     Other Supine Exercises  at 90 degrees arcs small holding 1# weight 2 minutes       Shoulder Exercises: Seated   Other  Seated Exercises  cane elevation 10x     Other Seated Exercises  scapular depression with hand resting on table 15x       Shoulder Exercises: Standing   Other Standing Exercises  bicep curls 2# 20x       Shoulder Exercises: Pulleys   Flexion  3 minutes      Shoulder Exercises: Isometric Strengthening   Flexion  5X5"    Internal Rotation  5X5"    ABduction  5X5"      Vasopneumatic   Number Minutes Vasopneumatic   10 minutes    Vasopnuematic Location   Shoulder    Vasopneumatic Pressure  Low    Vasopneumatic Temperature   3 snow flakes      Manual Therapy   Manual therapy comments  gentle rhythmic stab at 90 degrees 2 minute              PT Education - 02/27/20 0927    Education Details  Access Code: [email or web address]  supine circles/arcs with 1#; bicep curls 2#    Person(s) Educated  Patient    Methods  Explanation;Demonstration;Handout    Comprehension  Returned demonstration;Verbalized understanding       PT Short Term Goals - 02/22/20 0954      PT SHORT  TERM GOAL #1   Title  independent with initial HEP    Status  Achieved      PT SHORT TERM GOAL #2   Title  pt will improve use of Rt UE for tasks below the level of the chest to 75% without limitation    Time  4    Period  Weeks    Status  Achieved      PT SHORT TERM GOAL #3   Title  demonstrate Rt shoulder A/ROM flexion to > or = to 70 degrees without scapular elevation to improve ability to use with ADLs    Status  Achieved      PT SHORT TERM GOAL #4   Title  demonstrate Rt shoulder P/ROM ER to > or = to 60 degrees to improve mobility needed for self-care    Status  Achieved        PT Long Term Goals - 01/31/20 0957      PT LONG TERM GOAL #1   Title  The patient will be independent in safe self progression of HEP  for further improvements in ROM and strength    Time  8    Period  Weeks    Status  New    Target Date  03/27/20      PT LONG TERM GOAL #2   Title  The patient will have improved right shoulder A/ROM to 120 degrees needed for reaching eye level shelves and personal care    Baseline  --    Time  8    Period  Weeks    Status  New    Target Date  03/27/20      PT LONG TERM GOAL #3   Title  demonstrate 4-/5 Rt shoulder flexion and IR to improve functional endurance for ADLs and self-care    Baseline  --    Time  8    Period  Weeks    Status  New    Target Date  03/27/20      PT LONG TERM GOAL #4   Title  demonstrate Rt shoulder ER to C5 without scapular elevation to improve use with self-care    Baseline  --  Time  8    Period  Weeks    Status  New    Target Date  03/27/20      PT LONG TERM GOAL #5   Title  demonstrate Rt shoulder A/ROM IR to L3 to improve independence and ease with dressing    Time  8    Period  Weeks    Status  New    Target Date  03/27/20      Additional Long Term Goals   Additional Long Term Goals  Yes      PT LONG TERM GOAL #6   Title  reduce FOTO to <  or = to 33% limitation    Time  8    Period  Weeks    Status  New     Target Date  03/27/20            Plan - 02/27/20 [id number]    Clinical Impression Statement  The patient's primary complaint is instability particularly at night time with 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  His doctor is aware and recommended strengthening for joint stability.  In addition to frequent isometrics, we added 1# rhythmic stabilization in supine to HEP.  Therapist closely monitoring response with all interventions.  He has no pain or instability with these exercises.    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    PT Next Visit Plan  check ROM measurements;  low level strengthening "phase 2" and ROM; scapular and shoulder strengthening 1#    PT Home Exercise Plan  Access Code: [email or web address]       Patient will benefit from skilled therapeutic intervention in order to improve the following deficits and impairments:     Visit Diagnosis: Muscle weakness (generalized)  Acute pain of right shoulder  Stiffness of right shoulder, not elsewhere classified  Localized edema     Problem List Patient Active Problem List   Diagnosis Date Noted  . Piriformis syndrome of left side 08/18/2019  . Rotator cuff tear, right 05/29/2019  . Closed fracture of distal clavicle 05/15/2019  . 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 [address], [place], [place], [address] Phone: [phone]   Fax:  [phone]  Name: DEBRON FRITCH MRN: [id number] Date of Birth: 11-16-1945

## 2020-02-27 NOTE — Patient Instructions (Signed)
Access Code: [email or web address] URL: [email or web address] Date: 02/27/2020 Prepared by: [name]  Exercises Seated Gripping Towel - 3 x daily - 7 x weekly - 10 reps - 2 sets Circular Shoulder Pendulum with Table Support - 3 x daily - 7 x weekly - 10 reps - 1 sets Seated Shoulder Shrugs - 3 x daily - 7 x weekly - 10 reps - 2 sets Supine Shoulder Flexion AAROM with Hands Clasped - 3 x daily - 7 x weekly - 10 reps - 1 sets - 10 hold Seated Shoulder Flexion AAROM with Pulley Behind - 3 x daily - 7 x weekly Supine Shoulder Press AAROM in Abduction with Dowel - 2 x daily - 7 x weekly - 10 sets - 10 reps Isometric Shoulder Flexion at Wall - 1 x daily - 7 x weekly - 1 sets - 5 reps - 5 hold Isometric Shoulder Abduction at Wall - 1 x daily - 7 x weekly - 1 sets - 5 reps - 5 hold Isometric Shoulder Extension at Wall - 1 x daily - 7 x weekly - 1 sets - 5 reps - 5 hold Standing Shoulder Posterior Capsule Stretch - 1 x daily - 7 x weekly - 1 sets - 5 reps Supine Shoulder Circles with Weight - 1 x daily - 7 x weekly - 1 sets - 3 reps - 60 hold Seated Single Arm Bicep Curls with Rotation and Dumbbell - 1 x daily - 7 x weekly - 1 sets - 10 reps

## 2020-02-29 ENCOUNTER — Other Ambulatory Visit: Payer: Self-pay

## 2020-02-29 ENCOUNTER — Ambulatory Visit: Payer: Medicare Other | Admitting: Physical Therapy

## 2020-02-29 DIAGNOSIS — M25511 Pain in right shoulder: Secondary | ICD-10-CM

## 2020-02-29 DIAGNOSIS — M25611 Stiffness of right shoulder, not elsewhere classified: Secondary | ICD-10-CM

## 2020-02-29 DIAGNOSIS — R600 Localized edema: Secondary | ICD-10-CM

## 2020-02-29 DIAGNOSIS — M6281 Muscle weakness (generalized): Secondary | ICD-10-CM

## 2020-02-29 NOTE — Therapy (Signed)
[facility] [address] [place], [place], [address] Phone: [phone]   Fax:  [phone]  Physical Therapy Treatment  Patient Details  Name: Cory Barnett MRN: [id number] Date of Birth: 01-25-1946 Referring Provider (PT): Tania Ade, MD   Encounter Date: 02/29/2020  PT End of Session - 02/29/20 1225    Visit Number  9    Date for PT Re-Evaluation  03/27/20    Authorization Type  Medicare    Progress Note Due on Visit  10    PT Start Time  1143    PT Stop Time  1233    PT Time Calculation (min)  50 min    Activity Tolerance  Patient tolerated treatment well       Past Medical History:  Diagnosis Date  . Anxiety   . AV block, 1st degree   . Basal cell carcinoma   . Cardiac murmur   . Cataract   . Elevated PSA measurement 2018  . Hyperlipidemia   . Hypertension 2015  . 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 IFG (impaired fasting glucose) 01/2016  . Pre-diabetes   . RBBB (right bundle branch block)   . Rheumatic fever    age 67 or 70, in hospital for a month, out of school for a yr  . Seasonal allergies   . Squamous cell carcinoma     Past Surgical History:  Procedure Laterality Date  . EYE SURGERY     right eye  cataract surgery with lens implant  . NASAL SEPTUM SURGERY    . NOSE SURGERY    . 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 SHOULDER ARTHROSCOPY WITH SUBACROMIAL DECOMPRESSION Right 08/07/2019   Procedure: SHOULDER ARTHROSCOPY WITH SUBACROMIAL DECOMPRESSION;  Surgeon: Tania Ade, MD;  Location: [facility];  Service: Orthopedics;  Laterality: Right;  . SKIN CANCER EXCISION     basal and squamous cell   . TONSILLECTOMY    . VASECTOMY    . WISDOM TOOTH EXTRACTION      There were no vitals filed for this visit.  Subjective  Assessment - 02/29/20 1228    Subjective  Doing OK.  I've been doing the isometrics.  It's still moving out of place with certain motions when I'm in my truck and turning in bed but I'm hopeful that this strengthening is going to help.  Reports his overall pain level has been good lately.    Currently in Pain?  No/denies    Pain Score  0-No pain                       [facility] Adult PT Treatment/Exercise - 02/29/20 0001      Shoulder Exercises: Supine   Flexion  Strengthening;Right;20 reps;Weights    Shoulder Flexion Weight (lbs)  1    Other Supine Exercises  salutes bent elbow to forehead holding 1# weight 12x       Shoulder Exercises: Seated   Other Seated Exercises  place and holds around 90 degrees 5 sec 7x       Shoulder Exercises: Standing   Other Standing Exercises  ball on wall circles 4x 10 at 60 degrees     Other Standing Exercises  bent over shoulder extension 2# 10x; rows 2# 10x with careful attention to avoid shoulder hyperextension  Shoulder Exercises: ROM/Strengthening   Other ROM/Strengthening Exercises  ladder climbs to L30 5x    Other ROM/Strengthening Exercises  ladder lift offs at L20 5x       Vasopneumatic   Number Minutes Vasopneumatic   10 minutes    Vasopnuematic Location   Shoulder    Vasopneumatic Pressure  Low    Vasopneumatic Temperature   3 snow flakes      Manual Therapy   Manual therapy comments  gentle rhythmic stab at 90 degrees 3 minute  Supine      [employer]   Create Space  3 strips anterior and lateral shoulder for support/stability                PT Short Term Goals - 02/22/20 0954      PT SHORT TERM GOAL #1   Title  independent with initial HEP    Status  Achieved      PT SHORT TERM GOAL #2   Title  pt will improve use of Rt UE for tasks below the level of the chest to 75% without limitation    Time  4    Period  Weeks    Status  Achieved      PT SHORT TERM GOAL #3   Title  demonstrate Rt shoulder A/ROM  flexion to > or = to 70 degrees without scapular elevation to improve ability to use with ADLs    Status  Achieved      PT SHORT TERM GOAL #4   Title  demonstrate Rt shoulder P/ROM ER to > or = to 60 degrees to improve mobility needed for self-care    Status  Achieved        PT Long Term Goals - 01/31/20 0957      PT LONG TERM GOAL #1   Title  The patient will be independent in safe self progression of HEP  for further improvements in ROM and strength    Time  8    Period  Weeks    Status  New    Target Date  03/27/20      PT LONG TERM GOAL #2   Title  The patient will have improved right shoulder A/ROM to 120 degrees needed for reaching eye level shelves and personal care    Baseline  --    Time  8    Period  Weeks    Status  New    Target Date  03/27/20      PT LONG TERM GOAL #3   Title  demonstrate 4-/5 Rt shoulder flexion and IR to improve functional endurance for ADLs and self-care    Baseline  --    Time  8    Period  Weeks    Status  New    Target Date  03/27/20      PT LONG TERM GOAL #4   Title  demonstrate Rt shoulder ER to C5 without scapular elevation to improve use with self-care    Baseline  --    Time  8    Period  Weeks    Status  New    Target Date  03/27/20      PT LONG TERM GOAL #5   Title  demonstrate Rt shoulder A/ROM IR to L3 to improve independence and ease with dressing    Time  8    Period  Weeks    Status  New    Target Date  03/27/20  Additional Long Term Goals   Additional Long Term Goals  Yes      PT LONG TERM GOAL #6   Title  reduce FOTO to <  or = to 33% limitation    Time  8    Period  Weeks    Status  New    Target Date  03/27/20            Plan - 02/29/20 1352    Clinical Impression Statement  The patient is able to participate in a progression of low level strengthening with focus on stabilization type exercise (low level with respect to recent surgery).  Instability seems to be mostly in the anterior or  anterior/inferior directions.  Verbal and tactile cues to avoid hyper extension and external and internal rotation at this point and focus on frontal planes of motion in supine, sitting and standing.  Some discomfort reported with ex but  pain level is low overall and a "good sore" from strengthening which he finds pleasing.   Added KT tape anteriorly for shoulder support and stability.  Therapist monitoring response closely.    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    Rehab Potential  Good    PT Frequency  2x / week    PT Duration  8 weeks    PT Treatment/Interventions  ADLs/Self Care Home Management;Cryotherapy;Electrical Stimulation;Moist Heat;Iontophoresis 4mg /ml Dexamethasone;Functional mobility training;Therapeutic activities;Therapeutic exercise;Neuromuscular re-education;Manual techniques;Passive range of motion;Vasopneumatic Device    PT Next Visit Plan  10th visit progress note;  check ROM measurements; FOTO;  low level strengthening "phase 2" and ROM; scapular and shoulder strengthening and stabilization    PT Home Exercise Plan  Access Code: [email or web address]       Patient will benefit from skilled therapeutic intervention in order to improve the following deficits and impairments:  Decreased activity tolerance, Decreased strength, Postural dysfunction, Impaired flexibility, Decreased scar mobility, Pain, Impaired UE functional use, Increased muscle spasms, Decreased endurance  Visit Diagnosis: Muscle weakness (generalized)  Acute pain of right shoulder  Stiffness of right shoulder, not elsewhere classified  Localized edema     Problem List Patient Active Problem List   Diagnosis Date Noted  . Piriformis syndrome of left side 08/18/2019  . Rotator cuff tear, right 05/29/2019  . Closed fracture of distal clavicle 05/15/2019  . 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, [place], [place], [address] Phone: [phone]   Fax:  [phone]  Name: NEREO SHOBERT MRN: [id number] Date of Birth: 28-Mar-1946

## 2020-03-04 ENCOUNTER — Other Ambulatory Visit: Payer: Self-pay

## 2020-03-04 ENCOUNTER — Ambulatory Visit: Payer: Medicare Other | Attending: Orthopedic Surgery

## 2020-03-04 DIAGNOSIS — M25552 Pain in left hip: Secondary | ICD-10-CM | POA: Diagnosis not present

## 2020-03-04 DIAGNOSIS — M25611 Stiffness of right shoulder, not elsewhere classified: Secondary | ICD-10-CM | POA: Insufficient documentation

## 2020-03-04 DIAGNOSIS — M6281 Muscle weakness (generalized): Secondary | ICD-10-CM | POA: Insufficient documentation

## 2020-03-04 DIAGNOSIS — R600 Localized edema: Secondary | ICD-10-CM | POA: Insufficient documentation

## 2020-03-04 DIAGNOSIS — M25511 Pain in right shoulder: Secondary | ICD-10-CM | POA: Diagnosis not present

## 2020-03-04 NOTE — Therapy (Signed)
[facility] [address], [place], [place], [address] Phone: [phone]   Fax:  [phone]  Physical Therapy Treatment  Patient Details  Name: Cory Barnett MRN: [id number] Date of Birth: October 02, 1946 Referring Provider (PT): Tania Ade, MD   Encounter Date: 03/04/2020 Progress Note Reporting Period 01/31/20  to 03/04/20  See note below for Objective Data and Assessment of Progress/Goals.      PT End of Session - 03/04/20 1201    Visit Number  10    Date for PT Re-Evaluation  03/27/20    Authorization Type  Medicare    Progress Note Due on Visit  20    PT Start Time  1102    PT Stop Time  1146    PT Time Calculation (min)  44 min    Activity Tolerance  Patient tolerated treatment well    Behavior During Therapy  WFL for tasks assessed/performed       Past Medical History:  Diagnosis Date  . Anxiety   . AV block, 1st degree   . Basal cell carcinoma   . Cardiac murmur   . Cataract   . Elevated PSA measurement 2018  . Hyperlipidemia   . Hypertension 2015  . 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 IFG (impaired fasting glucose) 01/2016  . Pre-diabetes   . RBBB (right bundle branch block)   . Rheumatic fever    age 18 or 45, in hospital for a month, out of school for a yr  . Seasonal allergies   . Squamous cell carcinoma     Past Surgical History:  Procedure Laterality Date  . EYE SURGERY     right eye  cataract surgery with lens implant  . NASAL SEPTUM SURGERY    . NOSE SURGERY    . 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 SHOULDER ARTHROSCOPY WITH SUBACROMIAL DECOMPRESSION Right 08/07/2019   Procedure: SHOULDER ARTHROSCOPY WITH SUBACROMIAL DECOMPRESSION;  Surgeon: Tania Ade, MD;  Location: [place];  Service: Orthopedics;  Laterality:  Right;  . SKIN CANCER EXCISION     basal and squamous cell   . TONSILLECTOMY    . VASECTOMY    . WISDOM TOOTH EXTRACTION      There were no vitals filed for this visit.  Subjective Assessment - 03/04/20 1106    Subjective  Pain is good.  I'm getting ready to go out of town.    Pertinent History  MD orders:  on 4/20 pt reports "OK to move on to Phase 2, OK for strenghthening"  new order received;  ok for full A/AROM to tolerance.  Follow protocol.  KX at visit 15    Currently in Pain?  Yes    Pain Score  2     Pain Location  Shoulder    Pain Orientation  Right    Pain Descriptors / Indicators  Aching    Pain Type  Surgical pain    Pain Onset  1 to 4 weeks ago    Pain Frequency  Intermittent    Aggravating Factors   when it pops in and out, with sleep on Lt side    Pain Relieving Factors  rest, ice, Tylenol         [facility] PT Assessment - 03/04/20 0001      Assessment   Medical Diagnosis  s/p Rt reverse total shoulder replacement    Referring Provider (PT)  Tania Ade, MD    Onset Date/Surgical Date  01/11/20    Hand Dominance  Right      AROM   AROM Assessment Site  Shoulder    Right/Left Shoulder  Right    Right Shoulder Flexion  115 Degrees    Right Shoulder Internal Rotation  --   to Rt buttock   Right Shoulder External Rotation  --   to T1 with scapular elevation     PROM   Right Shoulder Flexion  130 Degrees    Right Shoulder ABduction  85 Degrees    Right Shoulder Internal Rotation  45 Degrees    Right Shoulder External Rotation  14 Degrees                   [facility] Adult PT Treatment/Exercise - 03/04/20 0001      Shoulder Exercises: Supine   Flexion  Strengthening;Right;20 reps;Weights    Shoulder Flexion Weight (lbs)  1    Other Supine Exercises  salutes bent elbow to forehead holding 1# weight 12x       Shoulder Exercises: Seated   Other Seated Exercises  place and holds around 90 degrees 5 sec 7x    shoulder blade retraction      Shoulder Exercises: Standing   Other Standing Exercises  ball on wall circles 4x 10 at 60 degrees       Shoulder Exercises: ROM/Strengthening   UBE (Upper Arm Bike)  Level 1x 6 minutes (reverse only)    Other ROM/Strengthening Exercises  ladder climbs to L30 5x      Kinesiotix   Create Space  3 strips anterior and lateral shoulder for support/stability                PT Short Term Goals - 03/04/20 1135      PT SHORT TERM GOAL #1   Title  independent with initial HEP    Status  Achieved      PT SHORT TERM GOAL #2   Title  pt will improve use of Rt UE for tasks below the level of the chest to 75% without limitation    Status  Achieved      PT SHORT TERM GOAL #3   Title  demonstrate Rt shoulder A/ROM flexion to > or = to 70 degrees without scapular elevation to improve ability to use with ADLs      PT SHORT TERM GOAL #4   Title  demonstrate Rt shoulder P/ROM ER to > or = to 60 degrees to improve mobility needed for self-care    Baseline  14- limited due to pain        PT Long Term Goals - 03/04/20 1135      PT LONG TERM GOAL #1   Title  The patient will be independent in safe self progression of HEP  for further improvements in ROM and strength    Time  8    Period  Weeks    Status  On-going      PT LONG TERM GOAL #2   Title  The patient will have improved right shoulder A/ROM to 120 degrees needed for reaching eye level shelves and personal care            Plan - 03/04/20 1123    Clinical Impression Statement  The patient is able to participate in a progression of low level strengthening with focus  on stabilization type exercise (low level with respect to recent surgery). Pt reports frequent instability of the joint in the anterior or anterior/inferior directions.  Verbal and tactile cues to avoid hyper extension and external and internal rotation at this point and focus on frontal planes of motion in supine, sitting and standing. Pt demonstrated fatigue with  ball rolls on the wall at 90 degrees and required tactile cues for scapular depression.  Pt responded well to kinesiotape for stability at home and tape was reapplied today.    PT Frequency  2x / week    PT Duration  8 weeks    PT Treatment/Interventions  ADLs/Self Care Home Management;Cryotherapy;Electrical Stimulation;Moist Heat;Iontophoresis 4mg /ml Dexamethasone;Functional mobility training;Therapeutic activities;Therapeutic exercise;Neuromuscular re-education;Manual techniques;Passive range of motion;Vasopneumatic Device    PT Next Visit Plan  scapular and shoulder strength and stability, low level strength phase 2 exercises    PT Home Exercise Plan  Access Code: [email or web address]    Consulted and Agree with Plan of Care  Patient       Patient will benefit from skilled therapeutic intervention in order to improve the following deficits and impairments:  Decreased activity tolerance, Decreased strength, Postural dysfunction, Impaired flexibility, Decreased scar mobility, Pain, Impaired UE functional use, Increased muscle spasms, Decreased endurance  Visit Diagnosis: Acute pain of right shoulder  Muscle weakness (generalized)  Stiffness of right shoulder, not elsewhere classified  Localized edema     Problem List Patient Active Problem List   Diagnosis Date Noted  . Piriformis syndrome of left side 08/18/2019  . Rotator cuff tear, right 05/29/2019  . Closed fracture of distal clavicle 05/15/2019  . 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     Sigurd Sos, PT 03/04/20 12:13 PM  [facility] [address], [place], [place], [address] Phone: [phone]   Fax:  [phone]  Name: Cory Barnett MRN: [id number] Date of Birth: 03/28/1946

## 2020-03-10 ENCOUNTER — Other Ambulatory Visit: Payer: Self-pay | Admitting: Cardiology

## 2020-03-12 ENCOUNTER — Other Ambulatory Visit: Payer: Self-pay

## 2020-03-12 ENCOUNTER — Ambulatory Visit: Payer: Medicare Other | Admitting: Physical Therapy

## 2020-03-12 DIAGNOSIS — M6281 Muscle weakness (generalized): Secondary | ICD-10-CM | POA: Diagnosis not present

## 2020-03-12 DIAGNOSIS — M25511 Pain in right shoulder: Secondary | ICD-10-CM

## 2020-03-12 DIAGNOSIS — M25611 Stiffness of right shoulder, not elsewhere classified: Secondary | ICD-10-CM | POA: Diagnosis not present

## 2020-03-12 DIAGNOSIS — M25552 Pain in left hip: Secondary | ICD-10-CM | POA: Diagnosis not present

## 2020-03-12 DIAGNOSIS — R600 Localized edema: Secondary | ICD-10-CM

## 2020-03-12 NOTE — Therapy (Signed)
[facility] [address] [place], [place], [address] Phone: [phone]   Fax:  [phone]  Physical Therapy Treatment  Patient Details  Name: Cory Barnett MRN: [id number] Date of Birth: [birth year] Referring Provider (PT): Tania Ade, MD   Encounter Date: 03/12/2020  PT End of Session - 03/12/20 0843    Visit Number  11    Date for PT Re-Evaluation  03/27/20    Authorization Type  Medicare    Progress Note Due on Visit  20    PT Start Time  0801    PT Stop Time  0850    PT Time Calculation (min)  49 min    Activity Tolerance  Patient tolerated treatment well       Past Medical History:  Diagnosis Date  . Anxiety   . AV block, 1st degree   . Basal cell carcinoma   . Cardiac murmur   . Cataract   . Elevated PSA measurement 2018  . Hyperlipidemia   . Hypertension 2015  . 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 IFG (impaired fasting glucose) 01/2016  . Pre-diabetes   . RBBB (right bundle branch block)   . Rheumatic fever    age 8 or 47, in hospital for a month, out of school for a yr  . Seasonal allergies   . Squamous cell carcinoma     Past Surgical History:  Procedure Laterality Date  . EYE SURGERY     right eye  cataract surgery with lens implant  . NASAL SEPTUM SURGERY    . NOSE SURGERY    . 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 SHOULDER ARTHROSCOPY WITH SUBACROMIAL DECOMPRESSION Right 08/07/2019   Procedure: SHOULDER ARTHROSCOPY WITH SUBACROMIAL DECOMPRESSION;  Surgeon: Tania Ade, MD;  Location: [place];  Service: Orthopedics;  Laterality: Right;  . SKIN CANCER EXCISION     basal and squamous cell   . TONSILLECTOMY    . VASECTOMY    . WISDOM TOOTH EXTRACTION      There were no vitals filed for this visit.  Subjective  Assessment - 03/12/20 0845    Subjective  Reports less popping out of place but still happens nightly.  No longer happens in my truck.    Pertinent History  MD orders:  on 4/20 pt reports "OK to move on to Phase 2, OK for strenghthening"  new order received;  ok for full A/AROM to tolerance.  Follow protocol.  KX at visit 15    Currently in Pain?  No/denies    Pain Score  0-No pain    Pain Location  Shoulder    Pain Orientation  Right                       [facility] Adult PT Treatment/Exercise - 03/12/20 0001      Shoulder Exercises: Supine   Flexion  Strengthening;Right;20 reps;Weights    Shoulder Flexion Weight (lbs)  2    Other Supine Exercises  supine elevation no weight 10x as warm up      Shoulder Exercises: Sidelying   Flexion  AROM;Right;10 reps;12 reps    ABduction  AROM;Right;5 reps      Shoulder Exercises: Standing   Flexion  Strengthening;Right;10 reps    Theraband Level (Shoulder Flexion)  Level 1 (Yellow)  ABduction  Strengthening;Right;10 reps;Theraband    Theraband Level (Shoulder ABduction)  Level 1 (Yellow)    ABduction Limitations  bent elbow    Other Standing Exercises  attempted 1# wt slides on wall but very fatigued at 5 reps    Other Standing Exercises  no weight wall slides 5x       Shoulder Exercises: ROM/Strengthening   Other ROM/Strengthening Exercises  seated right UE lifts off table at 80 degrees elevation 10x;  1# for 2nd set of 10       Vasopneumatic   Number Minutes Vasopneumatic   10 minutes    Vasopnuematic Location   Shoulder    Vasopneumatic Pressure  Low    Vasopneumatic Temperature   3 snow flakes      Kinesiotix   Create Space  3 strips anterior and lateral shoulder for support/stability                PT Short Term Goals - 03/04/20 1135      PT SHORT TERM GOAL #1   Title  independent with initial HEP    Status  Achieved      PT SHORT TERM GOAL #2   Title  pt will improve use of Rt UE for tasks below the  level of the chest to 75% without limitation    Status  Achieved      PT SHORT TERM GOAL #3   Title  demonstrate Rt shoulder A/ROM flexion to > or = to 70 degrees without scapular elevation to improve ability to use with ADLs      PT SHORT TERM GOAL #4   Title  demonstrate Rt shoulder P/ROM ER to > or = to 60 degrees to improve mobility needed for self-care    Baseline  14- limited due to pain        PT Long Term Goals - 03/04/20 1135      PT LONG TERM GOAL #1   Title  The patient will be independent in safe self progression of HEP  for further improvements in ROM and strength    Time  8    Period  Weeks    Status  On-going      PT LONG TERM GOAL #2   Title  The patient will have improved right shoulder A/ROM to 120 degrees needed for reaching eye level shelves and personal care            Plan - 03/12/20 0845    Clinical Impression Statement  The patient is now 8 1/2 weeks post surgery and able to progress strengthening in more challenging positions (against gravity in sidelying, sitting and standing) as well as increased resistance.  He is able to hold his arm in an elevated position fairly well now but with added weight, he fatigues rather quickly (around 5 reps).  Decreased overall incidence of instability but he reports it occurs nightly with turning in bed.  His pain level is lower intensity although increases with lowering his arm from an elevated position.  Therapist monitoring response with all interventions.  Vasocompression continues to be beneficial for edema management and pain control.    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    Examination-Activity Limitations  Lift;Reach Overhead    Examination-Participation Restrictions  Shop;Yard Work;Cleaning    Rehab Potential  Good    PT Frequency  2x / week    PT Duration  8 weeks    PT Treatment/Interventions  ADLs/Self Care Home Management;Cryotherapy;Electrical Stimulation;Moist Heat;Iontophoresis  4mg /ml  Dexamethasone;Functional mobility training;Therapeutic activities;Therapeutic exercise;Neuromuscular re-education;Manual techniques;Passive range of motion;Vasopneumatic Device    PT Next Visit Plan  scapular and shoulder strength and stability, low level strengthening for 8 weeks + post op    PT Home Exercise Plan  Access Code: [email or web address]       Patient will benefit from skilled therapeutic intervention in order to improve the following deficits and impairments:  Decreased activity tolerance, Decreased strength, Postural dysfunction, Impaired flexibility, Decreased scar mobility, Pain, Impaired UE functional use, Increased muscle spasms, Decreased endurance  Visit Diagnosis: Acute pain of right shoulder  Muscle weakness (generalized)  Stiffness of right shoulder, not elsewhere classified  Localized edema     Problem List Patient Active Problem List   Diagnosis Date Noted  . Piriformis syndrome of left side 08/18/2019  . Rotator cuff tear, right 05/29/2019  . Closed fracture of distal clavicle 05/15/2019  . 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, [place], [place], [address] Phone: [phone]   Fax:  [phone]  Name: Cory Barnett MRN: [id number] Date of Birth: 1946/06/24

## 2020-03-14 ENCOUNTER — Ambulatory Visit: Payer: Medicare Other | Admitting: Physical Therapy

## 2020-03-14 ENCOUNTER — Other Ambulatory Visit: Payer: Self-pay

## 2020-03-14 DIAGNOSIS — R600 Localized edema: Secondary | ICD-10-CM

## 2020-03-14 DIAGNOSIS — M25611 Stiffness of right shoulder, not elsewhere classified: Secondary | ICD-10-CM | POA: Diagnosis not present

## 2020-03-14 DIAGNOSIS — M25552 Pain in left hip: Secondary | ICD-10-CM | POA: Diagnosis not present

## 2020-03-14 DIAGNOSIS — M6281 Muscle weakness (generalized): Secondary | ICD-10-CM

## 2020-03-14 DIAGNOSIS — M25511 Pain in right shoulder: Secondary | ICD-10-CM

## 2020-03-14 NOTE — Patient Instructions (Signed)
Red band anchored in the door:    Lock your arm into the side of your ribcage and don't move your arm while you walk backward 4-5 steps and then slowly back in.  The slower the better!   Also walk forward and then return slowly as well.   Try 10x each way.          If you try pressing the band forward aim low and forward (rather than up and forward).      [name] PT [facility] [address] [place], [place] [address] Phone # [phone] Fax [phone]

## 2020-03-14 NOTE — Therapy (Signed)
[facility] [address], [place], [place], [address] Phone: [phone]   Fax:  [phone]  Physical Therapy Treatment  Patient Details  Name: Cory Barnett MRN: [id number] Date of Birth: Jan 21, 1946 Referring Provider (PT): Tania Ade, MD   Encounter Date: 03/14/2020  PT End of Session - 03/14/20 1306    Visit Number  12    Date for PT Re-Evaluation  03/27/20    Authorization Type  Medicare    Progress Note Due on Visit  20    PT Start Time  0803    PT Stop Time  0841    PT Time Calculation (min)  38 min    Activity Tolerance  Patient tolerated treatment well       Past Medical History:  Diagnosis Date  . Anxiety   . AV block, 1st degree   . Basal cell carcinoma   . Cardiac murmur   . Cataract   . Elevated PSA measurement 2018  . Hyperlipidemia   . Hypertension 2015  . 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 IFG (impaired fasting glucose) 01/2016  . Pre-diabetes   . RBBB (right bundle branch block)   . Rheumatic fever    age 32 or 8, in hospital for a month, out of school for a yr  . Seasonal allergies   . Squamous cell carcinoma     Past Surgical History:  Procedure Laterality Date  . EYE SURGERY     right eye  cataract surgery with lens implant  . NASAL SEPTUM SURGERY    . NOSE SURGERY    . 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 SHOULDER ARTHROSCOPY WITH SUBACROMIAL DECOMPRESSION Right 08/07/2019   Procedure: SHOULDER ARTHROSCOPY WITH SUBACROMIAL DECOMPRESSION;  Surgeon: Tania Ade, MD;  Location: [place];  Service: Orthopedics;  Laterality: Right;  . SKIN CANCER EXCISION     basal and squamous cell   . TONSILLECTOMY    . VASECTOMY    . WISDOM TOOTH EXTRACTION      There were no vitals filed for this visit.  Subjective  Assessment - 03/14/20 1256    Subjective  States his shoulder popped out of place earlier this morning and this was "the worst one yet."  It's really sore now.    Currently in Pain?  Yes    Pain Score  5     Pain Location  Shoulder    Pain Orientation  Right    Aggravating Factors   night time when I sleep on my left side;  when it pops out                        [facility] PT Treatment/Exercise - 03/14/20 0001      Shoulder Exercises: Standing   Flexion  Strengthening;Right;10 reps    Theraband Level (Shoulder Flexion)  Level 1 (Yellow)    Flexion Limitations  cued to limit to 70 degrees secondary to pain at 90 degrees     Other Standing Exercises  ball on wall small arc of movements up/down and side to side; circles at 45-60 degrees only of elevation     Other Standing Exercises  holding yellow and red band fixed/stationary by side with backwards and forwards walking 15x each       Shoulder Exercises: Isometric  Strengthening   Flexion  5X5"    Flexion Limitations  against ball on wall straight and bent elbow    ABduction  5X5"      Vasopneumatic   Number Minutes Vasopneumatic   10 minutes    Vasopnuematic Location   Shoulder    Vasopneumatic Pressure  Low    Vasopneumatic Temperature   3 snow flakes             PT Education - 03/14/20 0841    Education Details  red band stabilizing against side while walking backward and forward    Methods  Explanation;Demonstration;Handout    Comprehension  Returned demonstration;Verbalized understanding       PT Short Term Goals - 03/04/20 1135      PT SHORT TERM GOAL #1   Title  independent with initial HEP    Status  Achieved      PT SHORT TERM GOAL #2   Title  pt will improve use of Rt UE for tasks below the level of the chest to 75% without limitation    Status  Achieved      PT SHORT TERM GOAL #3   Title  demonstrate Rt shoulder A/ROM flexion to > or = to 70 degrees without scapular elevation to  improve ability to use with ADLs      PT SHORT TERM GOAL #4   Title  demonstrate Rt shoulder P/ROM ER to > or = to 60 degrees to improve mobility needed for self-care    Baseline  14- limited due to pain        PT Long Term Goals - 03/04/20 1135      PT LONG TERM GOAL #1   Title  The patient will be independent in safe self progression of HEP  for further improvements in ROM and strength    Time  8    Period  Weeks    Status  On-going      PT LONG TERM GOAL #2   Title  The patient will have improved right shoulder A/ROM to 120 degrees needed for reaching eye level shelves and personal care            Plan - 03/14/20 1307    Clinical Impression Statement  The patient presents with increased soreness attributed to an episode of shoulder instability this morning.  Treatment focus on stabilization within pain limitations.  Therapist very closely monitoring response and modifying exercises based on pain.  He reports his pain level and soreness is much improved following isometric type exercise and further relief with vasocompression.    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    Examination-Activity Limitations  Lift;Reach Overhead    Rehab Potential  Good    PT Frequency  2x / week    PT Duration  8 weeks    PT Treatment/Interventions  ADLs/Self Care Home Management;Cryotherapy;Electrical Stimulation;Moist Heat;Iontophoresis 4mg /ml Dexamethasone;Functional mobility training;Therapeutic activities;Therapeutic exercise;Neuromuscular re-education;Manual techniques;Passive range of motion;Vasopneumatic Device    PT Next Visit Plan  shoulder stability, low level strengthening for 8 weeks + post op    PT Home Exercise Plan  Access Code: [email or web address]       Patient will benefit from skilled therapeutic intervention in order to improve the following deficits and impairments:  Decreased activity tolerance, Decreased strength, Postural dysfunction, Impaired flexibility, Decreased scar mobility, Pain,  Impaired UE functional use, Increased muscle spasms, Decreased endurance  Visit Diagnosis: Acute pain of right shoulder  Muscle weakness (generalized)  Stiffness of right  shoulder, not elsewhere classified  Localized edema  Pain in left hip     Problem List Patient Active Problem List   Diagnosis Date Noted  . Piriformis syndrome of left side 08/18/2019  . Rotator cuff tear, right 05/29/2019  . Closed fracture of distal clavicle 05/15/2019  . 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, [place], [place], [address] Phone: [phone]   Fax:  [phone]  Name: Cory Barnett MRN: [id number] Date of Birth: [birth year]

## 2020-03-19 ENCOUNTER — Other Ambulatory Visit: Payer: Self-pay

## 2020-03-19 ENCOUNTER — Ambulatory Visit: Payer: Medicare Other

## 2020-03-19 DIAGNOSIS — M25552 Pain in left hip: Secondary | ICD-10-CM | POA: Diagnosis not present

## 2020-03-19 DIAGNOSIS — M6281 Muscle weakness (generalized): Secondary | ICD-10-CM | POA: Diagnosis not present

## 2020-03-19 DIAGNOSIS — M25511 Pain in right shoulder: Secondary | ICD-10-CM | POA: Diagnosis not present

## 2020-03-19 DIAGNOSIS — M25611 Stiffness of right shoulder, not elsewhere classified: Secondary | ICD-10-CM

## 2020-03-19 DIAGNOSIS — R600 Localized edema: Secondary | ICD-10-CM

## 2020-03-19 NOTE — Therapy (Signed)
[facility] [address] [place], [place], [address] Phone: [phone]   Fax:  [phone]  Physical Therapy Treatment  Patient Details  Name: Cory Barnett MRN: [id number] Date of Birth: 03-24-1946 Referring Provider (PT): Tania Ade, MD   Encounter Date: 03/19/2020  PT End of Session - 03/19/20 0935    Visit Number  13    Date for PT Re-Evaluation  03/27/20    Authorization Type  Medicare    Progress Note Due on Visit  20    PT Start Time  0847    PT Stop Time  0943    PT Time Calculation (min)  56 min    Activity Tolerance  Patient tolerated treatment well    Behavior During Therapy  [facility] for tasks assessed/performed       Past Medical History:  Diagnosis Date  . Anxiety   . AV block, 1st degree   . Basal cell carcinoma   . Cardiac murmur   . Cataract   . Elevated PSA measurement 2018  . Hyperlipidemia   . Hypertension 2015  . 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 IFG (impaired fasting glucose) 01/2016  . Pre-diabetes   . RBBB (right bundle branch block)   . Rheumatic fever    age 33 or 34, in hospital for a month, out of school for a yr  . Seasonal allergies   . Squamous cell carcinoma     Past Surgical History:  Procedure Laterality Date  . EYE SURGERY     right eye  cataract surgery with lens implant  . NASAL SEPTUM SURGERY    . NOSE SURGERY    . 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 SHOULDER ARTHROSCOPY WITH SUBACROMIAL DECOMPRESSION Right 08/07/2019   Procedure: SHOULDER ARTHROSCOPY WITH SUBACROMIAL DECOMPRESSION;  Surgeon: Tania Ade, MD;  Location: [place];  Service: Orthopedics;  Laterality: Right;  . SKIN CANCER EXCISION     basal and squamous cell   . TONSILLECTOMY    . VASECTOMY    . WISDOM TOOTH EXTRACTION       There were no vitals filed for this visit.  Subjective Assessment - 03/19/20 0855    Subjective  I haven't had as many episodes of my shoulder popping out of place this week.    Currently in Pain?  No/denies                        [facility] Adult PT Treatment/Exercise - 03/19/20 0001      Shoulder Exercises: Standing   Internal Rotation  Strengthening;Right;Theraband;20 reps    Theraband Level (Shoulder Internal Rotation)  Level 2 (Red)    Flexion  Strengthening;Right;10 reps    Theraband Level (Shoulder Flexion)  Level 2 (Red)    Extension  Strengthening;Right;Theraband;20 reps    Theraband Level (Shoulder Extension)  Level 2 (Red)    Other Standing Exercises  ball on wall small arc of movements up/down and side to side; circles at 45-60 degrees only of elevation     Other Standing Exercises  wall push ups 2x10      Shoulder Exercises: ROM/Strengthening   UBE (Upper Arm Bike)  Level 2 x 8 minutes   PT present to monitor     Vasopneumatic   Number Minutes Vasopneumatic   15 minutes  Vasopnuematic Location   Shoulder    Vasopneumatic Pressure  Low    Vasopneumatic Temperature   3 snow flakes      Manual Therapy   Manual therapy comments  gentle rhythmic stab at 90 degrees 3 minute              PT Education - 03/19/20 0922    Education Details  Access Code: [email or web address]    Person(s) Educated  Patient    Methods  Explanation;Demonstration;Handout    Comprehension  Verbalized understanding;Returned demonstration       PT Short Term Goals - 03/04/20 1135      PT SHORT TERM GOAL #1   Title  independent with initial HEP    Status  Achieved      PT SHORT TERM GOAL #2   Title  pt will improve use of Rt UE for tasks below the level of the chest to 75% without limitation    Status  Achieved      PT SHORT TERM GOAL #3   Title  demonstrate Rt shoulder A/ROM flexion to > or = to 70 degrees without scapular elevation to improve ability to use with ADLs       PT SHORT TERM GOAL #4   Title  demonstrate Rt shoulder P/ROM ER to > or = to 60 degrees to improve mobility needed for self-care    Baseline  14- limited due to pain        PT Long Term Goals - 03/04/20 1135      PT LONG TERM GOAL #1   Title  The patient will be independent in safe self progression of HEP  for further improvements in ROM and strength    Time  8    Period  Weeks    Status  On-going      PT LONG TERM GOAL #2   Title  The patient will have improved right shoulder A/ROM to 120 degrees needed for reaching eye level shelves and personal care            Plan - 03/19/20 0939    Clinical Impression Statement  Pt is making steady progress with PT and was able to advance to red theraband today with shoulder strength exercises.  Pt demonstrates significant improvement in stabilization of the Rt shoulder musculature with rhythmic stabilization.  Pt reports fewer episodes of shoulder instability since last session.  Pt requires minor verbal and tactile cues for scapular depression and protraction with exercise today.  Pt will continue to benefit from skilled PT to address Rt shoulder strength, stability and functional use s/p Rt total shoulder replacement.    PT Frequency  2x / week    PT Duration  8 weeks    PT Treatment/Interventions  ADLs/Self Care Home Management;Cryotherapy;Electrical Stimulation;Moist Heat;Iontophoresis 4mg /ml Dexamethasone;Functional mobility training;Therapeutic activities;Therapeutic exercise;Neuromuscular re-education;Manual techniques;Passive range of motion;Vasopneumatic Device    PT Next Visit Plan  shoulder stability, low level strengthening for 8 weeks + post op    PT Home Exercise Plan  Access Code: [email or web address]    Consulted and Agree with Plan of Care  Patient       Patient will benefit from skilled therapeutic intervention in order to improve the following deficits and impairments:  Decreased activity tolerance, Decreased strength, Postural  dysfunction, Impaired flexibility, Decreased scar mobility, Pain, Impaired UE functional use, Increased muscle spasms, Decreased endurance  Visit Diagnosis: Acute pain of right shoulder  Muscle weakness (generalized)  Stiffness of right shoulder, not elsewhere  classified  Localized edema     Problem List Patient Active Problem List   Diagnosis Date Noted  . Piriformis syndrome of left side 08/18/2019  . Rotator cuff tear, right 05/29/2019  . Closed fracture of distal clavicle 05/15/2019  . 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     Sigurd Sos, PT 03/19/20 9:41 AM  [facility] [address], [place], [place], [address] Phone: [phone]   Fax:  [phone]  Name: Cory Barnett MRN: [id number] Date of Birth: 06-17-1946

## 2020-03-19 NOTE — Patient Instructions (Signed)
Access Code: [email or web address] URL: [email or web address] Date: 03/19/2020 Prepared by: Claiborne Billings  Exercises  Shoulder Internal Rotation with Resistance - 1 x daily - 7 x weekly - 2 sets - 10 reps Standing Single Arm Shoulder Flexion with Posterior Anchored Resistance - 1 x daily - 7 x weekly - 2 sets - 10 reps Single Arm Shoulder Extension with Anchored Resistance - 2 x daily - 7 x weekly - 2 sets - 10 reps

## 2020-03-21 ENCOUNTER — Ambulatory Visit: Payer: Medicare Other

## 2020-03-21 ENCOUNTER — Other Ambulatory Visit: Payer: Self-pay

## 2020-03-21 DIAGNOSIS — R600 Localized edema: Secondary | ICD-10-CM

## 2020-03-21 DIAGNOSIS — M6281 Muscle weakness (generalized): Secondary | ICD-10-CM

## 2020-03-21 DIAGNOSIS — M25552 Pain in left hip: Secondary | ICD-10-CM | POA: Diagnosis not present

## 2020-03-21 DIAGNOSIS — M25611 Stiffness of right shoulder, not elsewhere classified: Secondary | ICD-10-CM | POA: Diagnosis not present

## 2020-03-21 DIAGNOSIS — M25511 Pain in right shoulder: Secondary | ICD-10-CM

## 2020-03-21 NOTE — Therapy (Signed)
[facility] [address], [place], [place], [address] Phone: [phone]   Fax:  [phone]  Physical Therapy Treatment  Patient Details  Name: Cory Barnett MRN: [id number] Date of Birth: 03-26-1946 Referring Provider (PT): Tania Ade, MD   Encounter Date: 03/21/2020  PT End of Session - 03/21/20 0924    Visit Number  14    Date for PT Re-Evaluation  03/27/20    Authorization Type  Medicare    Progress Note Due on Visit  20    PT Start Time  0845    PT Stop Time  0938    PT Time Calculation (min)  53 min    Activity Tolerance  Patient tolerated treatment well    Behavior During Therapy  [facility] for tasks assessed/performed       Past Medical History:  Diagnosis Date  . Anxiety   . AV block, 1st degree   . Basal cell carcinoma   . Cardiac murmur   . Cataract   . Elevated PSA measurement 2018  . Hyperlipidemia   . Hypertension 2015  . 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 IFG (impaired fasting glucose) 01/2016  . Pre-diabetes   . RBBB (right bundle branch block)   . Rheumatic fever    age 54 or 24, in hospital for a month, out of school for a yr  . Seasonal allergies   . Squamous cell carcinoma     Past Surgical History:  Procedure Laterality Date  . EYE SURGERY     right eye  cataract surgery with lens implant  . NASAL SEPTUM SURGERY    . NOSE SURGERY    . 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 SHOULDER ARTHROSCOPY WITH SUBACROMIAL DECOMPRESSION Right 08/07/2019   Procedure: SHOULDER ARTHROSCOPY WITH SUBACROMIAL DECOMPRESSION;  Surgeon: Tania Ade, MD;  Location: [place];  Service: Orthopedics;  Laterality: Right;  . SKIN CANCER EXCISION     basal and squamous cell   . TONSILLECTOMY    . VASECTOMY    . WISDOM TOOTH EXTRACTION       There were no vitals filed for this visit.  Subjective Assessment - 03/21/20 0903    Subjective  I did a lot of work with my arm yesterday so I am sore.    Currently in Pain?  Yes    Pain Score  5     Pain Location  Shoulder    Pain Orientation  Right    Pain Descriptors / Indicators  Aching                        [facility] Adult PT Treatment/Exercise - 03/21/20 0001      Shoulder Exercises: Supine   Other Supine Exercises  holding green ball: supine circles 2x10 each      Shoulder Exercises: Standing   Internal Rotation  Strengthening;Right;Theraband;20 reps    Theraband Level (Shoulder Internal Rotation)  Level 2 (Red)    Flexion  Strengthening;Right;10 reps    Theraband Level (Shoulder Flexion)  Level 2 (Red)    Extension  Strengthening;Right;Theraband;20 reps    Theraband Level (Shoulder Extension)  Level 2 (Red)    Other Standing Exercises  ball on wall small arc of movements up/down and side to side; circles at 45-60 degrees only of elevation  Other Standing Exercises  wall push ups 2x10      Shoulder Exercises: ROM/Strengthening   UBE (Upper Arm Bike)  Level 2 x 6  minutes   PT present to monitor     Vasopneumatic   Number Minutes Vasopneumatic   15 minutes    Vasopnuematic Location   Shoulder    Vasopneumatic Pressure  Low    Vasopneumatic Temperature   3 snow flakes      Manual Therapy   Manual therapy comments  gentle rhythmic stab at 90 degrees 3 minute                PT Short Term Goals - 03/04/20 1135      PT SHORT TERM GOAL #1   Title  independent with initial HEP    Status  Achieved      PT SHORT TERM GOAL #2   Title  pt will improve use of Rt UE for tasks below the level of the chest to 75% without limitation    Status  Achieved      PT SHORT TERM GOAL #3   Title  demonstrate Rt shoulder A/ROM flexion to > or = to 70 degrees without scapular elevation to improve ability to use with ADLs      PT SHORT TERM GOAL #4    Title  demonstrate Rt shoulder P/ROM ER to > or = to 60 degrees to improve mobility needed for self-care    Baseline  14- limited due to pain        PT Long Term Goals - 03/04/20 1135      PT LONG TERM GOAL #1   Title  The patient will be independent in safe self progression of HEP  for further improvements in ROM and strength    Time  8    Period  Weeks    Status  On-going      PT LONG TERM GOAL #2   Title  The patient will have improved right shoulder A/ROM to 120 degrees needed for reaching eye level shelves and personal care            Plan - 03/21/20 0914    Clinical Impression Statement  Pt is making steady progress with PT and was able to advance to red theraband today with shoulder strength exercises.  Pt demonstrates significant improvement in stabilization of the Rt shoulder musculature with rhythmic stabilization.  Pt has not had an episode of Rt shoulder instability since last week.  Pt requires minor verbal and tactile cues for scapular depression (much less frequent needed today)  and protraction with exercise today.  Pt will continue to benefit from skilled PT to address Rt shoulder strength, stability and functional use s/p Rt total shoulder replacement.    PT Frequency  2x / week    PT Duration  8 weeks    PT Treatment/Interventions  ADLs/Self Care Home Management;Cryotherapy;Electrical Stimulation;Moist Heat;Iontophoresis 4mg /ml Dexamethasone;Functional mobility training;Therapeutic activities;Therapeutic exercise;Neuromuscular re-education;Manual techniques;Passive range of motion;Vasopneumatic Device    PT Next Visit Plan  shoulder stability, low level strengthening for 8 weeks + post op    PT Home Exercise Plan  Access Code: [email or web address]    Consulted and Agree with Plan of Care  Patient       Patient will benefit from skilled therapeutic intervention in order to improve the following deficits and impairments:  Decreased activity tolerance, Decreased strength,  Postural dysfunction, Impaired flexibility, Decreased scar mobility, Pain, Impaired UE functional use, Increased muscle  spasms, Decreased endurance  Visit Diagnosis: Acute pain of right shoulder  Muscle weakness (generalized)  Stiffness of right shoulder, not elsewhere classified  Localized edema     Problem List Patient Active Problem List   Diagnosis Date Noted  . Piriformis syndrome of left side 08/18/2019  . Rotator cuff tear, right 05/29/2019  . Closed fracture of distal clavicle 05/15/2019  . 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 [place], [place], [address] Phone: [phone]   Fax:  [phone]  Name: AREK OLIVAR MRN: [id number] Date of Birth: 1945-11-07

## 2020-03-26 ENCOUNTER — Ambulatory Visit: Payer: Medicare Other

## 2020-03-26 ENCOUNTER — Other Ambulatory Visit: Payer: Self-pay

## 2020-03-26 DIAGNOSIS — M25511 Pain in right shoulder: Secondary | ICD-10-CM

## 2020-03-26 DIAGNOSIS — M25611 Stiffness of right shoulder, not elsewhere classified: Secondary | ICD-10-CM | POA: Diagnosis not present

## 2020-03-26 DIAGNOSIS — R600 Localized edema: Secondary | ICD-10-CM | POA: Diagnosis not present

## 2020-03-26 DIAGNOSIS — M6281 Muscle weakness (generalized): Secondary | ICD-10-CM

## 2020-03-26 DIAGNOSIS — M25552 Pain in left hip: Secondary | ICD-10-CM | POA: Diagnosis not present

## 2020-03-26 NOTE — Therapy (Signed)
[facility] [address], [place], [place], [address] Phone: [phone]   Fax:  [phone]  Physical Therapy Treatment  Patient Details  Name: Cory Barnett MRN: [id number] Date of Birth: 1946-08-06 Referring Provider (PT): Tania Ade, MD   Encounter Date: 03/26/2020  PT End of Session - 03/26/20 0830    Visit Number  15    Date for PT Re-Evaluation  05/21/20    Authorization Type  Medicare    Progress Note Due on Visit  20    PT Start Time  0804    PT Stop Time  0903    PT Time Calculation (min)  59 min    Activity Tolerance  Patient tolerated treatment well    Behavior During Therapy  [facility] for tasks assessed/performed       Past Medical History:  Diagnosis Date  . Anxiety   . AV block, 1st degree   . Basal cell carcinoma   . Cardiac murmur   . Cataract   . Elevated PSA measurement 2018  . Hyperlipidemia   . Hypertension 2015  . 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 IFG (impaired fasting glucose) 01/2016  . Pre-diabetes   . RBBB (right bundle branch block)   . Rheumatic fever    age 74 or 74, in hospital for a month, out of school for a yr  . Seasonal allergies   . Squamous cell carcinoma     Past Surgical History:  Procedure Laterality Date  . EYE SURGERY     right eye  cataract surgery with lens implant  . NASAL SEPTUM SURGERY    . NOSE SURGERY    . 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 SHOULDER ARTHROSCOPY WITH SUBACROMIAL DECOMPRESSION Right 08/07/2019   Procedure: SHOULDER ARTHROSCOPY WITH SUBACROMIAL DECOMPRESSION;  Surgeon: Tania Ade, MD;  Location: [place];  Service: Orthopedics;  Laterality: Right;  . SKIN CANCER EXCISION     basal and squamous cell   . TONSILLECTOMY    . VASECTOMY    . WISDOM TOOTH EXTRACTION       There were no vitals filed for this visit.  Subjective Assessment - 03/26/20 0822    Subjective  I have pain in the front of my shoulder.    Pertinent History  MD orders:  on 4/20 pt reports "OK to move on to Phase 2, OK for strenghthening"  new order received;  ok for full A/AROM to tolerance.  Follow protocol.  KX at visit 15    Currently in Pain?  No/denies    Pain Score  --   up to [id number] with certain movements   Pain Location  Shoulder    Pain Descriptors / Indicators  Aching    Pain Type  Surgical pain    Pain Onset  More than a month ago    Pain Frequency  Intermittent    Aggravating Factors   reaching with arm resting to the side    Pain Relieving Factors  rest, ice, Tylenol         [facility] PT Assessment - 03/26/20 0001      Assessment   Medical Diagnosis  s/p Rt reverse total shoulder replacement    Referring Provider (PT)  Tania Ade, MD    Onset Date/Surgical Date  01/11/20    Hand Dominance  Right      Precautions   Precautions  Shoulder      Prior Function   Level of Independence  Independent    Vocation  Part time employment      Cognition   Overall Cognitive Status  Within Functional Limits for tasks assessed      Observation/Other Assessments   Focus on Therapeutic Outcomes (FOTO)   41% limitation      AROM   Right Shoulder Flexion  115 Degrees    Right Shoulder ABduction  90 Degrees    Right Shoulder External Rotation  --   to T1 with assistance on back of the head     Strength   Overall Strength  Unable to assess;Due to precautions    Strength Assessment Site  Shoulder    Right/Left Shoulder  Right    Right Shoulder Flexion  4-/5    Right Shoulder Internal Rotation  4/5    Right Shoulder External Rotation  3-/5      Palpation   Palpation comment  tenderness over the Rt anterior shoulder with pocket of edema over anterior/lateral delotoid and at proximal biceps tendon                    [facility] Adult PT Treatment/Exercise -  03/26/20 0001      Shoulder Exercises: Standing   Shoulder Elevation Limitations  UE Ranger: flexion x 10- fatigue of Rt shoulder     Other Standing Exercises  ball on wall small arc of movements up/down and side to side; circles at 45-60 degrees only of elevation     Other Standing Exercises  wall push ups 2x10      Shoulder Exercises: ROM/Strengthening   UBE (Upper Arm Bike)  Level 2 x 6  minutes   PT present to monitor     Vasopneumatic   Number Minutes Vasopneumatic   15 minutes    Vasopnuematic Location   Shoulder    Vasopneumatic Pressure  Low    Vasopneumatic Temperature   3 snow flakes      Manual Therapy   Manual Therapy  Soft tissue mobilization    Manual therapy comments  gentle cross friction over incision and proximal biceps tendon on the Rt.  elongation over Rt anterior/lateral deltoid over area of mild edema.                PT Short Term Goals - 03/04/20 1135      PT SHORT TERM GOAL #1   Title  independent with initial HEP    Status  Achieved      PT SHORT TERM GOAL #2   Title  pt will improve use of Rt UE for tasks below the level of the chest to 75% without limitation    Status  Achieved      PT SHORT TERM GOAL #3   Title  demonstrate Rt shoulder A/ROM flexion to > or = to 70 degrees without scapular elevation to improve ability to use with ADLs      PT SHORT TERM GOAL #4   Title  demonstrate Rt shoulder P/ROM ER to > or = to 60 degrees to improve mobility needed for self-care    Baseline  14- limited due to pain        PT Long Term Goals - 03/26/20 0824      PT LONG TERM GOAL #1   Title  The patient will be independent in safe self progression of HEP  for further improvements in ROM and strength    Time  8    Period  Weeks    Status  On-going    Target Date  05/21/20      PT LONG TERM GOAL #2   Title  The patient will have improved right shoulder A/ROM to 125 degrees needed for reaching eye level shelves and personal care    Time  8     Period  Weeks    Status  On-going    Target Date  05/21/20      PT LONG TERM GOAL #3   Title  demonstrate 4/5 Rt shoulder flexion and IR to improve functional endurance for ADLs and self-care    Time  8    Period  Weeks    Status  Revised      PT LONG TERM GOAL #4   Title  demonstrate Rt shoulder ER to C5 without scapular elevation to improve use with self-care    Baseline  scapular elevation and assistance on back of head    Time  8    Period  Weeks    Status  On-going    Target Date  05/21/20      PT LONG TERM GOAL #5   Title  demonstrate Rt shoulder A/ROM IR to L3 to improve independence and ease with dressing    Time  8    Period  Weeks    Status  On-going    Target Date  05/21/20      PT LONG TERM GOAL #6   Title  reduce FOTO to <  or = to 33% limitation    Time  8    Period  Weeks    Status  On-going    Target Date  05/21/20            Plan - 03/26/20 0950    Clinical Impression Statement  Pt is making steady progress s/p Rt reverse total shoulder replacement.  Pt with improved yet limited Rt shoulder strength and A/ROM. Pt demonstrates scapular elevation with Rt shoulder ER and abduction due to RTC weakness.  Pt with a pocket of edema and tenderness over Rt anterior and lateral deltoid.  FOTO is improved to 41% limitation.  Pt is limited to use of Rt UE for work tasks due to flare-up of pain after repetitive use.  Pt has had episodes of "subluxation" of the Rt shoulder with turning over in bed or reaching out.  This has become much less frequent over the past week.  Pt will continue to benefit from skilled PT to address Rt shoulder strength, flexibility, endurance and pain management after total shoulder replacement surgery.    Rehab Potential  Good    PT Frequency  2x / week    PT Duration  8 weeks    PT Treatment/Interventions  ADLs/Self Care Home Management;Cryotherapy;Electrical Stimulation;Moist Heat;Iontophoresis 4mg /ml Dexamethasone;Functional mobility  training;Therapeutic activities;Therapeutic exercise;Neuromuscular re-education;Manual techniques;Passive range of motion;Vasopneumatic Device    PT Next Visit Plan  shoulder stability, low level strengthening for 8 weeks + post op.  Pt is going to the MD Friday.  Note/recert was sent today.  KX now    PT Home Exercise Plan  Access Code: [email or web address]    Recommended Other Services  recert sent [id number]    Consulted and Agree with Plan of Care  Patient       Patient will benefit from skilled therapeutic intervention in order to improve the following deficits and impairments:  Decreased activity tolerance, Decreased strength, Postural dysfunction, Impaired flexibility, Decreased scar mobility, Pain, Impaired UE functional use, Increased muscle spasms, Decreased endurance  Visit Diagnosis: Acute pain of right shoulder - Plan: PT plan of care cert/re-cert  Muscle weakness (generalized) - Plan: PT plan of care cert/re-cert  Stiffness of right shoulder, not elsewhere classified - Plan: PT plan of care cert/re-cert  Localized edema - Plan: PT plan of care cert/re-cert     Problem List Patient Active Problem List   Diagnosis Date Noted  . Piriformis syndrome of left side 08/18/2019  . Rotator cuff tear, right 05/29/2019  . Closed fracture of distal clavicle 05/15/2019  . 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, [place], [place], [address] Phone: [phone]   Fax:  [phone]  Name: ESCHOL MELEAR MRN: [id number] Date of Birth: November 02, 1946

## 2020-03-28 ENCOUNTER — Other Ambulatory Visit: Payer: Self-pay

## 2020-03-28 ENCOUNTER — Ambulatory Visit: Payer: Medicare Other | Admitting: Physical Therapy

## 2020-03-28 DIAGNOSIS — M6281 Muscle weakness (generalized): Secondary | ICD-10-CM | POA: Diagnosis not present

## 2020-03-28 DIAGNOSIS — R600 Localized edema: Secondary | ICD-10-CM

## 2020-03-28 DIAGNOSIS — M25611 Stiffness of right shoulder, not elsewhere classified: Secondary | ICD-10-CM | POA: Diagnosis not present

## 2020-03-28 DIAGNOSIS — M25552 Pain in left hip: Secondary | ICD-10-CM | POA: Diagnosis not present

## 2020-03-28 DIAGNOSIS — M25511 Pain in right shoulder: Secondary | ICD-10-CM | POA: Diagnosis not present

## 2020-03-28 NOTE — Therapy (Signed)
[facility] [address] [place], [place], [address] Phone: [phone]   Fax:  [phone]  Physical Therapy Treatment  Patient Details  Name: Cory Barnett MRN: [id number] Date of Birth: [birth year] Referring Provider (PT): Tania Ade, MD   Encounter Date: 03/28/2020  PT End of Session - 03/28/20 0944    Visit Number  16    Date for PT Re-Evaluation  05/21/20    Authorization Type  Medicare  KX    Progress Note Due on Visit  20    PT Start Time  0930    PT Stop Time  1000   shortened session/pt with GI upset   PT Time Calculation (min)  30 min    Activity Tolerance  Patient tolerated treatment well       Past Medical History:  Diagnosis Date  . Anxiety   . AV block, 1st degree   . Basal cell carcinoma   . Cardiac murmur   . Cataract   . Elevated PSA measurement 2018  . Hyperlipidemia   . Hypertension 2015  . 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 IFG (impaired fasting glucose) 01/2016  . Pre-diabetes   . RBBB (right bundle branch block)   . Rheumatic fever    age 63 or 62, in hospital for a month, out of school for a yr  . Seasonal allergies   . Squamous cell carcinoma     Past Surgical History:  Procedure Laterality Date  . EYE SURGERY     right eye  cataract surgery with lens implant  . NASAL SEPTUM SURGERY    . NOSE SURGERY    . 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 SHOULDER ARTHROSCOPY WITH SUBACROMIAL DECOMPRESSION Right 08/07/2019   Procedure: SHOULDER ARTHROSCOPY WITH SUBACROMIAL DECOMPRESSION;  Surgeon: Tania Ade, MD;  Location: [place];  Service: Orthopedics;  Laterality: Right;  . SKIN CANCER EXCISION     basal and squamous cell   . TONSILLECTOMY    . VASECTOMY    . WISDOM TOOTH EXTRACTION      There were no  vitals filed for this visit.  Subjective Assessment - 03/28/20 0932    Subjective  Doing pretty good but I have been overworking.  Less occurrence of instability at nighttime.  Happens about twice a week.    Currently in Pain?  No/denies    Pain Score  0-No pain    Pain Location  Shoulder                        [facility] Adult PT Treatment/Exercise - 03/28/20 0001      Shoulder Exercises: Standing   Other Standing Exercises  towel slides on wall 10x 2    Other Standing Exercises  kneeling on edge of low mat 2# extensions 10x, horizontal abduction 10x 2# smaller range       Shoulder Exercises: ROM/Strengthening   UBE (Upper Arm Bike)  Level 2 x 4  minutes   PT present to monitor   Other ROM/Strengthening Exercises  holding blue band static while walking backward and forward, left side step 10x each     Other ROM/Strengthening Exercises  supine propped on wedge circles, A to Z       Manual Therapy   Manual therapy comments  gentle cross friction  over incision and proximal biceps tendon on the right              PT Short Term Goals - 03/04/20 1135      PT SHORT TERM GOAL #1   Title  independent with initial HEP    Status  Achieved      PT SHORT TERM GOAL #2   Title  pt will improve use of Rt UE for tasks below the level of the chest to 75% without limitation    Status  Achieved      PT SHORT TERM GOAL #3   Title  demonstrate Rt shoulder A/ROM flexion to > or = to 70 degrees without scapular elevation to improve ability to use with ADLs      PT SHORT TERM GOAL #4   Title  demonstrate Rt shoulder P/ROM ER to > or = to 60 degrees to improve mobility needed for self-care    Baseline  14- limited due to pain        PT Long Term Goals - 03/26/20 0824      PT LONG TERM GOAL #1   Title  The patient will be independent in safe self progression of HEP  for further improvements in ROM and strength    Time  8    Period  Weeks    Status  On-going    Target  Date  05/21/20      PT LONG TERM GOAL #2   Title  The patient will have improved right shoulder A/ROM to 125 degrees needed for reaching eye level shelves and personal care    Time  8    Period  Weeks    Status  On-going    Target Date  05/21/20      PT LONG TERM GOAL #3   Title  demonstrate 4/5 Rt shoulder flexion and IR to improve functional endurance for ADLs and self-care    Time  8    Period  Weeks    Status  Revised      PT LONG TERM GOAL #4   Title  demonstrate Rt shoulder ER to C5 without scapular elevation to improve use with self-care    Baseline  scapular elevation and assistance on back of head    Time  8    Period  Weeks    Status  On-going    Target Date  05/21/20      PT LONG TERM GOAL #5   Title  demonstrate Rt shoulder A/ROM IR to L3 to improve independence and ease with dressing    Time  8    Period  Weeks    Status  On-going    Target Date  05/21/20      PT LONG TERM GOAL #6   Title  reduce FOTO to <  or = to 33% limitation    Time  8    Period  Weeks    Status  On-going    Target Date  05/21/20            Plan - 03/28/20 1005    Clinical Impression Statement  The patient has considerably less instability, now only 2x/week vs. 3x/ per night.  He is able to progress stabilization type ex's including dynamic type isometrics and supine propped on wedge sustained elevation.  Less discomfort with lowering UE from elevated position.  Treatment session shortened secondary to pt report of GI upset.    Rehab Potential  Good  PT Frequency  2x / week    PT Treatment/Interventions  ADLs/Self Care Home Management;Cryotherapy;Electrical Stimulation;Moist Heat;Iontophoresis 4mg /ml Dexamethasone;Functional mobility training;Therapeutic activities;Therapeutic exercise;Neuromuscular re-education;Manual techniques;Passive range of motion;Vasopneumatic Device    PT Next Visit Plan  shoulder stability, low level strengthening for 8 weeks + post op. See how MD appt  went;   KX now    PT Home Exercise Plan  Access Code: [email or web address]       Patient will benefit from skilled therapeutic intervention in order to improve the following deficits and impairments:  Decreased activity tolerance, Decreased strength, Postural dysfunction, Impaired flexibility, Decreased scar mobility, Pain, Impaired UE functional use, Increased muscle spasms, Decreased endurance  Visit Diagnosis: Acute pain of right shoulder  Muscle weakness (generalized)  Stiffness of right shoulder, not elsewhere classified  Localized edema     Problem List Patient Active Problem List   Diagnosis Date Noted  . Piriformis syndrome of left side 08/18/2019  . Rotator cuff tear, right 05/29/2019  . Closed fracture of distal clavicle 05/15/2019  . 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   [name], PT 03/28/20 10:12 AM Phone: [phone] Fax: [phone] [name] 03/28/2020, 10:11 AM  [facility] [address], [place], [place], [address] Phone: [phone]   Fax:  [phone]  Name: Cory Barnett MRN: [id number] Date of Birth: [birth year]

## 2020-04-02 ENCOUNTER — Other Ambulatory Visit: Payer: Self-pay

## 2020-04-02 ENCOUNTER — Ambulatory Visit: Payer: Medicare Other | Attending: Orthopedic Surgery

## 2020-04-02 DIAGNOSIS — M25511 Pain in right shoulder: Secondary | ICD-10-CM | POA: Diagnosis not present

## 2020-04-02 DIAGNOSIS — R600 Localized edema: Secondary | ICD-10-CM | POA: Diagnosis present

## 2020-04-02 DIAGNOSIS — M25552 Pain in left hip: Secondary | ICD-10-CM | POA: Diagnosis present

## 2020-04-02 DIAGNOSIS — M25611 Stiffness of right shoulder, not elsewhere classified: Secondary | ICD-10-CM | POA: Diagnosis present

## 2020-04-02 DIAGNOSIS — M6281 Muscle weakness (generalized): Secondary | ICD-10-CM

## 2020-04-02 NOTE — Therapy (Signed)
[facility] [address], [place], [place], [address] Phone: [phone]   Fax:  [phone]  Physical Therapy Treatment  Patient Details  Name: Cory Barnett MRN: [id number] Date of Birth: [birth year] Referring Provider (PT): Tania Ade, MD   Encounter Date: 04/02/2020  PT End of Session - 04/02/20 0844    Visit Number  17    Date for PT Re-Evaluation  05/21/20    Authorization Type  Medicare  KX    Progress Note Due on Visit  20    PT Start Time  0804    PT Stop Time  0854    PT Time Calculation (min)  50 min    Activity Tolerance  Patient tolerated treatment well    Behavior During Therapy  [facility] for tasks assessed/performed       Past Medical History:  Diagnosis Date  . Anxiety   . AV block, 1st degree   . Basal cell carcinoma   . Cardiac murmur   . Cataract   . Elevated PSA measurement 2018  . Hyperlipidemia   . Hypertension 2015  . 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 IFG (impaired fasting glucose) 01/2016  . Pre-diabetes   . RBBB (right bundle branch block)   . Rheumatic fever    age 74 or 64, in hospital for a month, out of school for a yr  . Seasonal allergies   . Squamous cell carcinoma     Past Surgical History:  Procedure Laterality Date  . EYE SURGERY     right eye  cataract surgery with lens implant  . NASAL SEPTUM SURGERY    . NOSE SURGERY    . 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 SHOULDER ARTHROSCOPY WITH SUBACROMIAL DECOMPRESSION Right 08/07/2019   Procedure: SHOULDER ARTHROSCOPY WITH SUBACROMIAL DECOMPRESSION;  Surgeon: Tania Ade, MD;  Location: [place];  Service: Orthopedics;  Laterality: Right;  . SKIN CANCER EXCISION     basal and squamous cell   . TONSILLECTOMY    . VASECTOMY    . WISDOM TOOTH EXTRACTION       There were no vitals filed for this visit.  Subjective Assessment - 04/02/20 0846    Subjective  I havent been feeling well due to GI upset.    Pertinent History  MD orders:  on 4/20 pt reports "OK to move on to Phase 2, OK for strenghthening"  new order received;  ok for full A/AROM to tolerance.  Follow protocol.  KX at visit 15    Currently in Pain?  No/denies                        [facility] Adult PT Treatment/Exercise - 04/02/20 0001      Shoulder Exercises: Standing   Shoulder Elevation Limitations  counter top push ups 2x10    Other Standing Exercises  ball circles on the wall    Other Standing Exercises  kneeling on edge of low mat 2# extensions 10x, horizontal abduction 10x 2# smaller range       Shoulder Exercises: ROM/Strengthening   UBE (Upper Arm Bike)  Level 2 x 6  minutes      Vasopneumatic   Number Minutes Vasopneumatic   10 minutes    Vasopnuematic Location   Shoulder    Vasopneumatic Pressure  Medium    Vasopneumatic Temperature   3 snow flakes               PT Short Term Goals - 03/04/20 1135      PT SHORT TERM GOAL #1   Title  independent with initial HEP    Status  Achieved      PT SHORT TERM GOAL #2   Title  pt will improve use of Rt UE for tasks below the level of the chest to 75% without limitation    Status  Achieved      PT SHORT TERM GOAL #3   Title  demonstrate Rt shoulder A/ROM flexion to > or = to 70 degrees without scapular elevation to improve ability to use with ADLs      PT SHORT TERM GOAL #4   Title  demonstrate Rt shoulder P/ROM ER to > or = to 60 degrees to improve mobility needed for self-care    Baseline  14- limited due to pain        PT Long Term Goals - 03/26/20 0824      PT LONG TERM GOAL #1   Title  The patient will be independent in safe self progression of HEP  for further improvements in ROM and strength    Time  8    Period  Weeks    Status  On-going    Target Date  05/21/20      PT LONG  TERM GOAL #2   Title  The patient will have improved right shoulder A/ROM to 125 degrees needed for reaching eye level shelves and personal care    Time  8    Period  Weeks    Status  On-going    Target Date  05/21/20      PT LONG TERM GOAL #3   Title  demonstrate 4/5 Rt shoulder flexion and IR to improve functional endurance for ADLs and self-care    Time  8    Period  Weeks    Status  Revised      PT LONG TERM GOAL #4   Title  demonstrate Rt shoulder ER to C5 without scapular elevation to improve use with self-care    Baseline  scapular elevation and assistance on back of head    Time  8    Period  Weeks    Status  On-going    Target Date  05/21/20      PT LONG TERM GOAL #5   Title  demonstrate Rt shoulder A/ROM IR to L3 to improve independence and ease with dressing    Time  8    Period  Weeks    Status  On-going    Target Date  05/21/20      PT LONG TERM GOAL #6   Title  reduce FOTO to <  or = to 33% limitation    Time  8    Period  Weeks    Status  On-going    Target Date  05/21/20            Plan - 04/02/20 0842    Clinical Impression Statement  The patient has considerably less instability of the Rt  shoulder with minimal to no episodes of subluxation over the past few days.  Pt with improved use of Rt UE against gravity. He is able to progress stabilization type ex's including dynamic type isometrics and supine propped on wedge sustained elevation.  Pt with increased fatigue today  due to GI upset over the weekend.  Pt will continue to benefit from skilled PT to improve Rt shoulder strength, endurance and use.    PT Frequency  2x / week    PT Duration  8 weeks    PT Treatment/Interventions  ADLs/Self Care Home Management;Cryotherapy;Electrical Stimulation;Moist Heat;Iontophoresis 4mg /ml Dexamethasone;Functional mobility training;Therapeutic activities;Therapeutic exercise;Neuromuscular re-education;Manual techniques;Passive range of motion;Vasopneumatic Device     PT Next Visit Plan  shoulder stability, low level strengthening for 8 weeks + post op. See how MD appt went;   KX now    PT Home Exercise Plan  Access Code: [email or web address]    Consulted and Agree with Plan of Care  Patient       Patient will benefit from skilled therapeutic intervention in order to improve the following deficits and impairments:  Decreased activity tolerance, Decreased strength, Postural dysfunction, Impaired flexibility, Decreased scar mobility, Pain, Impaired UE functional use, Increased muscle spasms, Decreased endurance  Visit Diagnosis: Acute pain of right shoulder  Muscle weakness (generalized)  Stiffness of right shoulder, not elsewhere classified  Localized edema     Problem List Patient Active Problem List   Diagnosis Date Noted  . Piriformis syndrome of left side 08/18/2019  . Rotator cuff tear, right 05/29/2019  . Closed fracture of distal clavicle 05/15/2019  . 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     Sigurd Sos, PT 04/02/20 8:47 AM  [facility] [address], [place], [place], [address] Phone: [phone]   Fax:  [phone]  Name: Cory Barnett MRN: [id number] Date of Birth: Sep 15, 1946

## 2020-04-04 ENCOUNTER — Encounter: Payer: Medicare Other | Admitting: Physical Therapy

## 2020-04-06 DIAGNOSIS — Z471 Aftercare following joint replacement surgery: Secondary | ICD-10-CM | POA: Diagnosis not present

## 2020-04-06 DIAGNOSIS — Z96611 Presence of right artificial shoulder joint: Secondary | ICD-10-CM | POA: Diagnosis not present

## 2020-04-09 ENCOUNTER — Ambulatory Visit: Payer: Medicare Other

## 2020-04-09 ENCOUNTER — Other Ambulatory Visit: Payer: Self-pay

## 2020-04-09 DIAGNOSIS — M25611 Stiffness of right shoulder, not elsewhere classified: Secondary | ICD-10-CM

## 2020-04-09 DIAGNOSIS — M25511 Pain in right shoulder: Secondary | ICD-10-CM | POA: Diagnosis not present

## 2020-04-09 DIAGNOSIS — M6281 Muscle weakness (generalized): Secondary | ICD-10-CM

## 2020-04-09 DIAGNOSIS — R600 Localized edema: Secondary | ICD-10-CM

## 2020-04-09 NOTE — Therapy (Signed)
[facility] [address] [place], [place], [address] Phone: [phone]   Fax:  [phone]  Physical Therapy Treatment  Patient Details  Name: Cory Barnett MRN: [id number] Date of Birth: June 27, 1946 Referring Provider (PT): Tania Ade, MD   Encounter Date: 04/09/2020  PT End of Session - 04/09/20 1014    Visit Number  18    Date for PT Re-Evaluation  05/21/20    Authorization Type  Medicare  KX    Progress Note Due on Visit  20    PT Start Time  0930    PT Stop Time  1020    PT Time Calculation (min)  50 min    Activity Tolerance  Patient tolerated treatment well    Behavior During Therapy  [facility] for tasks assessed/performed       Past Medical History:  Diagnosis Date  . Anxiety   . AV block, 1st degree   . Basal cell carcinoma   . Cardiac murmur   . Cataract   . Elevated PSA measurement 2018  . Hyperlipidemia   . Hypertension 2015  . 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 IFG (impaired fasting glucose) 01/2016  . Pre-diabetes   . RBBB (right bundle branch block)   . Rheumatic fever    age 58 or 20, in hospital for a month, out of school for a yr  . Seasonal allergies   . Squamous cell carcinoma     Past Surgical History:  Procedure Laterality Date  . EYE SURGERY     right eye  cataract surgery with lens implant  . NASAL SEPTUM SURGERY    . NOSE SURGERY    . 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 SHOULDER ARTHROSCOPY WITH SUBACROMIAL DECOMPRESSION Right 08/07/2019   Procedure: SHOULDER ARTHROSCOPY WITH SUBACROMIAL DECOMPRESSION;  Surgeon: Tania Ade, MD;  Location: [place];  Service: Orthopedics;  Laterality: Right;  . SKIN CANCER EXCISION     basal and squamous cell   . TONSILLECTOMY    . VASECTOMY    . WISDOM TOOTH EXTRACTION       There were no vitals filed for this visit.  Subjective Assessment - 04/09/20 0937    Subjective  I am feeling much better.  I saw the MD saturday, x-ray looks good.  I can lift up to 25# now.  No bench press overhead use with weights.    Pertinent History  MD orders:  on 4/20 pt reports "OK to move on to Phase 2, OK for strenghthening"  new order received;  ok for full A/AROM to tolerance.  Follow protocol.  KX at visit 15    Currently in Pain?  No/denies                        [facility] Adult PT Treatment/Exercise - 04/09/20 0001      Shoulder Exercises: Standing   Other Standing Exercises  ball circles on the wall    Other Standing Exercises  kneeling on edge of low mat 2# extensions 10x, horizontal abduction 10x 2# smaller range       Shoulder Exercises: Pulleys   Other Pulley Exercises  IR x 3 minutes       Shoulder Exercises: ROM/Strengthening   UBE (Upper Arm Bike)  Level 2 x 6  minutes  Shoulder Exercises: Power [employer]  20 reps    Row Limitations  25#    Internal Rotation  20 reps    Internal Rotation Limitations  10#    Other Power Recruitment consultant press: 30# 2x10    Other Power Engineer, water   20# 2x10 on Rt       Vasopneumatic   Number Minutes Vasopneumatic   15 minutes    Vasopnuematic Location   Shoulder    Vasopneumatic Pressure  Medium    Vasopneumatic Temperature   3 snow flakes               PT Short Term Goals - 03/04/20 1135      PT SHORT TERM GOAL #1   Title  independent with initial HEP    Status  Achieved      PT SHORT TERM GOAL #2   Title  pt will improve use of Rt UE for tasks below the level of the chest to 75% without limitation    Status  Achieved      PT SHORT TERM GOAL #3   Title  demonstrate Rt shoulder A/ROM flexion to > or = to 70 degrees without scapular elevation to improve ability to use with ADLs      PT SHORT TERM GOAL #4   Title  demonstrate Rt shoulder P/ROM ER to > or = to 60 degrees  to improve mobility needed for self-care    Baseline  14- limited due to pain        PT Long Term Goals - 03/26/20 0824      PT LONG TERM GOAL #1   Title  The patient will be independent in safe self progression of HEP  for further improvements in ROM and strength    Time  8    Period  Weeks    Status  On-going    Target Date  05/21/20      PT LONG TERM GOAL #2   Title  The patient will have improved right shoulder A/ROM to 125 degrees needed for reaching eye level shelves and personal care    Time  8    Period  Weeks    Status  On-going    Target Date  05/21/20      PT LONG TERM GOAL #3   Title  demonstrate 4/5 Rt shoulder flexion and IR to improve functional endurance for ADLs and self-care    Time  8    Period  Weeks    Status  Revised      PT LONG TERM GOAL #4   Title  demonstrate Rt shoulder ER to C5 without scapular elevation to improve use with self-care    Baseline  scapular elevation and assistance on back of head    Time  8    Period  Weeks    Status  On-going    Target Date  05/21/20      PT LONG TERM GOAL #5   Title  demonstrate Rt shoulder A/ROM IR to L3 to improve independence and ease with dressing    Time  8    Period  Weeks    Status  On-going    Target Date  05/21/20      PT LONG TERM GOAL #6   Title  reduce FOTO to <  or = to 33% limitation    Time  8    Period  Weeks    Status  On-going    Target Date  05/21/20            Plan - 04/09/20 1014    Clinical Impression Statement  Pt saw the MD and he is pleased with pt progress.  Pt has not had episodes of instability recently and MD feels that he won't have to had additional procedure for spacer.  Pt tolerated advanced weight training with pulley weight today and required tactile cues for postural alignment.  Pt had fatigue after exercise but not pain.  Pt will continue to benefit from skilled PT to address Rt shoulder strength and functional use.    PT Frequency  2x / week    PT Duration   8 weeks    PT Treatment/Interventions  ADLs/Self Care Home Management;Cryotherapy;Electrical Stimulation;Moist Heat;Iontophoresis 4mg /ml Dexamethasone;Functional mobility training;Therapeutic activities;Therapeutic exercise;Neuromuscular re-education;Manual techniques;Passive range of motion;Vasopneumatic Device    PT Next Visit Plan  shoulder stability, advance strength as tolerated    PT Home Exercise Plan  Access Code: [email or web address]    Consulted and Agree with Plan of Care  Patient       Patient will benefit from skilled therapeutic intervention in order to improve the following deficits and impairments:  Decreased activity tolerance, Decreased strength, Postural dysfunction, Impaired flexibility, Decreased scar mobility, Pain, Impaired UE functional use, Increased muscle spasms, Decreased endurance  Visit Diagnosis: Acute pain of right shoulder  Muscle weakness (generalized)  Stiffness of right shoulder, not elsewhere classified  Localized edema     Problem List Patient Active Problem List   Diagnosis Date Noted  . Piriformis syndrome of left side 08/18/2019  . Rotator cuff tear, right 05/29/2019  . Closed fracture of distal clavicle 05/15/2019  . 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    Sigurd Sos, PT 04/09/20 10:17 AM  [facility] [address], [place], [place], [address] Phone: [phone]   Fax:  [phone]  Name: Cory Barnett MRN: [id number] Date of Birth: [birth year]

## 2020-04-11 ENCOUNTER — Other Ambulatory Visit: Payer: Self-pay

## 2020-04-11 ENCOUNTER — Ambulatory Visit: Payer: Medicare Other | Admitting: Physical Therapy

## 2020-04-11 DIAGNOSIS — M25511 Pain in right shoulder: Secondary | ICD-10-CM | POA: Diagnosis not present

## 2020-04-11 DIAGNOSIS — M25611 Stiffness of right shoulder, not elsewhere classified: Secondary | ICD-10-CM

## 2020-04-11 DIAGNOSIS — R600 Localized edema: Secondary | ICD-10-CM

## 2020-04-11 DIAGNOSIS — M6281 Muscle weakness (generalized): Secondary | ICD-10-CM

## 2020-04-11 NOTE — Therapy (Signed)
[facility] [address] [place], [place], [address] Phone: [phone]   Fax:  [phone]  Physical Therapy Treatment  Patient Details  Name: Cory Barnett MRN: [id number] Date of Birth: [birth year] Referring Provider (PT): Tania Ade, MD   Encounter Date: 04/11/2020   PT End of Session - 04/11/20 1916    Visit Number 19    Date for PT Re-Evaluation 05/21/20    Authorization Type Medicare  KX    Progress Note Due on Visit 20    PT Start Time 0937    PT Stop Time 1022    PT Time Calculation (min) 45 min    Activity Tolerance Patient tolerated treatment well           Past Medical History:  Diagnosis Date  . Anxiety   . AV block, 1st degree   . Basal cell carcinoma   . Cardiac murmur   . Cataract   . Elevated PSA measurement 2018  . Hyperlipidemia   . Hypertension 2015  . 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 IFG (impaired fasting glucose) 01/2016  . Pre-diabetes   . RBBB (right bundle branch block)   . Rheumatic fever    age 44 or 60, in hospital for a month, out of school for a yr  . Seasonal allergies   . Squamous cell carcinoma     Past Surgical History:  Procedure Laterality Date  . EYE SURGERY     right eye  cataract surgery with lens implant  . NASAL SEPTUM SURGERY    . NOSE SURGERY    . 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 SHOULDER ARTHROSCOPY WITH SUBACROMIAL DECOMPRESSION Right 08/07/2019   Procedure: SHOULDER ARTHROSCOPY WITH SUBACROMIAL DECOMPRESSION;  Surgeon: Tania Ade, MD;  Location: [facility];  Service: Orthopedics;  Laterality: Right;  . SKIN CANCER EXCISION     basal and squamous cell   . TONSILLECTOMY    . VASECTOMY    . WISDOM TOOTH EXTRACTION      There were no vitals filed for this visit.   Subjective  Assessment - 04/11/20 0938    Subjective I need more strength.  I would like to have more ROM around my back to put a belt on.  Instability is rare now.  Took a quick trip to [place].    Pertinent History KX at visit 15    Currently in Pain? No/denies    Pain Score 0-No pain    Pain Location Shoulder    Pain Orientation Right    Pain Type Surgical pain                             [facility] Adult PT Treatment/Exercise - 04/11/20 0001      Shoulder Exercises: Supine   Flexion Strengthening;Right;5 reps    Shoulder Flexion Weight (lbs) 2    Flexion Limitations prop on wedge       Shoulder Exercises: Standing   Flexion Limitations wall ladder 10x L30       Shoulder Exercises: ROM/Strengthening   UBE (Upper Arm Bike) Level 2 x 4  minutes   PT present to monitor   Proximal Shoulder Strengthening, Seated PNF D1 and D2 diagonal flexion 10x each     Other ROM/Strengthening Exercises place and holds  for internal rotation 3x 30 sec holds    Other ROM/Strengthening Exercises 4# lawnmower pulls 2x 10       Shoulder Exercises: Power [employer] 20 reps    Row Limitations 25#    Other Power Tower Exercises triceps press: 30# 2x10      Vasopneumatic   Number Minutes Vasopneumatic  10 minutes    Vasopnuematic Location  Shoulder    Vasopneumatic Pressure Low    Vasopneumatic Temperature  3 snow flakes                    PT Short Term Goals - 03/04/20 1135      PT SHORT TERM GOAL #1   Title independent with initial HEP    Status Achieved      PT SHORT TERM GOAL #2   Title pt will improve use of Rt UE for tasks below the level of the chest to 75% without limitation    Status Achieved      PT SHORT TERM GOAL #3   Title demonstrate Rt shoulder A/ROM flexion to > or = to 70 degrees without scapular elevation to improve ability to use with ADLs      PT SHORT TERM GOAL #4   Title demonstrate Rt shoulder P/ROM ER to > or = to 60 degrees to improve mobility needed for  self-care    Baseline 14- limited due to pain             PT Long Term Goals - 03/26/20 0824      PT LONG TERM GOAL #1   Title The patient will be independent in safe self progression of HEP  for further improvements in ROM and strength    Time 8    Period Weeks    Status On-going    Target Date 05/21/20      PT LONG TERM GOAL #2   Title The patient will have improved right shoulder A/ROM to 125 degrees needed for reaching eye level shelves and personal care    Time 8    Period Weeks    Status On-going    Target Date 05/21/20      PT LONG TERM GOAL #3   Title demonstrate 4/5 Rt shoulder flexion and IR to improve functional endurance for ADLs and self-care    Time 8    Period Weeks    Status Revised      PT LONG TERM GOAL #4   Title demonstrate Rt shoulder ER to C5 without scapular elevation to improve use with self-care    Baseline scapular elevation and assistance on back of head    Time 8    Period Weeks    Status On-going    Target Date 05/21/20      PT LONG TERM GOAL #5   Title demonstrate Rt shoulder A/ROM IR to L3 to improve independence and ease with dressing    Time 8    Period Weeks    Status On-going    Target Date 05/21/20      PT LONG TERM GOAL #6   Title reduce FOTO to <  or = to 33% limitation    Time 8    Period Weeks    Status On-going    Target Date 05/21/20                 Plan - 04/11/20 1916    Clinical Impression Statement The patient has significantly reduced overall  pain level even with semi-recumbent shoulder elevation and lowering.  He is able to continue with a progression of upper quarter strengthening open chain and quadruped weight bearing.  Therapist closely monitoring response with all treatment interventions.    Rehab Potential Good    PT Frequency 2x / week    PT Duration 8 weeks    PT Treatment/Interventions ADLs/Self Care Home Management;Cryotherapy;Electrical Stimulation;Moist Heat;Iontophoresis 4mg /ml  Dexamethasone;Functional mobility training;Therapeutic activities;Therapeutic exercise;Neuromuscular re-education;Manual techniques;Passive range of motion;Vasopneumatic Device    PT Next Visit Plan 20th visit progress note;  recheck ROM, strength and progress toward goals;  shoulder stability, advance strength as tolerated;  KX    PT Home Exercise Plan Access Code: [email or web address]           Patient will benefit from skilled therapeutic intervention in order to improve the following deficits and impairments:  Decreased activity tolerance, Decreased strength, Postural dysfunction, Impaired flexibility, Decreased scar mobility, Pain, Impaired UE functional use, Increased muscle spasms, Decreased endurance  Visit Diagnosis: Acute pain of right shoulder  Muscle weakness (generalized)  Stiffness of right shoulder, not elsewhere classified  Localized edema     Problem List Patient Active Problem List   Diagnosis Date Noted  . Piriformis syndrome of left side 08/18/2019  . Rotator cuff tear, right 05/29/2019  . Closed fracture of distal clavicle 05/15/2019  . 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   [name], PT 04/11/20 7:21 PM Phone: [phone] Fax: [phone] [name] 04/11/2020, 7:21 PM  [facility] [address], [place], [place], [address] Phone: [phone]   Fax:  [phone]  Name: Cory Barnett MRN: [id number] Date of Birth: 01-24-1946

## 2020-04-16 ENCOUNTER — Ambulatory Visit: Payer: Medicare Other | Admitting: Physical Therapy

## 2020-04-16 ENCOUNTER — Other Ambulatory Visit: Payer: Self-pay

## 2020-04-16 DIAGNOSIS — M25511 Pain in right shoulder: Secondary | ICD-10-CM | POA: Diagnosis not present

## 2020-04-16 DIAGNOSIS — M25611 Stiffness of right shoulder, not elsewhere classified: Secondary | ICD-10-CM

## 2020-04-16 DIAGNOSIS — M6281 Muscle weakness (generalized): Secondary | ICD-10-CM

## 2020-04-16 DIAGNOSIS — R600 Localized edema: Secondary | ICD-10-CM

## 2020-04-16 NOTE — Therapy (Signed)
[facility] [address] [place], [place], [address] Phone: [phone]   Fax:  [phone]  Physical Therapy Treatment  Patient Details  Name: Cory Barnett MRN: [id number] Date of Birth: 01-12-1946 Referring Provider (PT): Tania Ade, MD  Progress Note Reporting Period 03/04/20 to 04/16/20  See note below for Objective Data and Assessment of Progress/Goals.      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   IFG (impaired fasting glucose) 01/2016   Pre-diabetes    RBBB (right bundle branch block)    Rheumatic fever    age 50 or 71, in hospital for a month, out of school for a yr   Seasonal allergies    Squamous cell carcinoma     Past Surgical History:  Procedure Laterality Date   EYE SURGERY     right eye  cataract surgery with lens implant   NASAL SEPTUM SURGERY     NOSE SURGERY     REVERSE SHOULDER ARTHROPLASTY Right 01/11/2020   Procedure: REVERSE SHOULDER ARTHROPLASTY;  Surgeon: Tania Ade, MD;  Location: [facility];  Service: Orthopedics;  Laterality: Right;   SHOULDER ARTHROSCOPY WITH SUBACROMIAL DECOMPRESSION Right 08/07/2019   Procedure: SHOULDER ARTHROSCOPY WITH SUBACROMIAL DECOMPRESSION;  Surgeon: Tania Ade, MD;  Location: [facility];  Service: Orthopedics;  Laterality: Right;   SKIN CANCER EXCISION     basal and squamous cell      TONSILLECTOMY     VASECTOMY     WISDOM TOOTH EXTRACTION      There were no vitals filed for this visit.   Subjective Assessment - 04/16/20 0851    Subjective Denies soreness after last visit.  Has bruise on arm just above elbow for unknown region.  States this has happened before a few times.    Pertinent History KX at visit 15    Patient Stated Goals raise arm overhead, improve strength of Rt UE    Currently in Pain? No/denies    Pain Score 0-No pain    Pain Location Shoulder    Pain Orientation Right    Pain Type Surgical pain              [facility] PT Assessment - 04/16/20 0001      AROM   Right Shoulder Flexion 132 Degrees    Right Shoulder ABduction 137 Degrees    Right Shoulder Internal Rotation --   actively to sacrum; assisted L1     Strength   Right Shoulder Flexion 4-/5    Right Shoulder Internal Rotation 4/5    Right Shoulder External Rotation 3-/5  [facility] Adult PT Treatment/Exercise - 04/16/20 0001      Shoulder Exercises: Supine   Flexion Strengthening;Right;10 reps;Weights    Shoulder Flexion Weight (lbs) 1    Flexion Limitations prop on wedge     Other Supine Exercises 2# clockwise and counter 10x each       Shoulder Exercises: Sidelying   Flexion Strengthening;Right;10 reps;Weights    Flexion Weight (lbs) 1    ABduction AROM;Right;10 reps    ABduction Weight (lbs) 1      Shoulder Exercises: Standing   Flexion Limitations wall ladder 10x L30     Other Standing Exercises PNF D1 and D2 diagonal flexion 10x each way     Other Standing Exercises yellow band wall slides palms to wall 10x    very fatiguing      Shoulder Exercises: ROM/Strengthening   UBE (Upper Arm Bike) Level 2 x 4  minutes   PT present to monitor   Other ROM/Strengthening Exercises place and holds for internal rotation 3x 30 sec holds    Other ROM/Strengthening Exercises dynamic hug 2x10 red band       Vasopneumatic   Number Minutes  Vasopneumatic  10 minutes    Vasopnuematic Location  Shoulder    Vasopneumatic Pressure Low    Vasopneumatic Temperature  3 snow flakes                    PT Short Term Goals - 04/16/20 2011      PT SHORT TERM GOAL #1   Title independent with initial HEP    Status Achieved      PT SHORT TERM GOAL #2   Status Achieved      PT SHORT TERM GOAL #3   Title demonstrate Rt shoulder A/ROM flexion to > or = to 70 degrees without scapular elevation to improve ability to use with ADLs    Status Achieved      PT SHORT TERM GOAL #4   Title demonstrate Rt shoulder P/ROM ER to > or = to 60 degrees to improve mobility needed for self-care    Status Achieved             PT Long Term Goals - 04/16/20 2012      PT LONG TERM GOAL #1   Title The patient will be independent in safe self progression of HEP  for further improvements in ROM and strength    Time 8    Period Weeks    Status On-going      PT LONG TERM GOAL #2   Title The patient will have improved right shoulder A/ROM to 125 degrees needed for reaching eye level shelves and personal care    Status Achieved      PT LONG TERM GOAL #3   Title demonstrate 4/5 Rt shoulder flexion and IR to improve functional endurance for ADLs and self-care    Time 8    Period Weeks    Status On-going      PT LONG TERM GOAL #4   Title demonstrate Rt shoulder ER to C5 without scapular elevation to improve use with self-care    Time 8    Period Weeks    Status On-going      PT LONG TERM GOAL #5   Title demonstrate Rt shoulder A/ROM IR to L3 to improve independence and ease with dressing    Time 8    Period Weeks    Status On-going      PT  LONG TERM GOAL #6   Title reduce FOTO to <  or = to 33% limitation    Time 8    Period Weeks    Status On-going                 Plan - 04/16/20 2005    Clinical Impression Statement The patient has made significant recent improvements in shoulder ROM: flexion improved from 115  degrees to 132 degrees and abduction improved from 90 degrees to 137 degrees.  He is able to participate in a progression of UE strengthening today with mild discomfort  and muscle fatigue within 10 repetitions of elevation.  Fewer incidences of shoulder instability as he had often in previous weeks following last surgery.    He is progressing with rehab goals but would benefit from continued PT to address long term deficits as the result of initial trauma followed by 2 shoulder surgeries.    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    Rehab Potential Good    PT Frequency 2x / week    PT Duration 8 weeks    PT Treatment/Interventions ADLs/Self Care Home Management;Cryotherapy;Electrical Stimulation;Moist Heat;Iontophoresis 4mg /ml Dexamethasone;Functional mobility training;Therapeutic activities;Therapeutic exercise;Neuromuscular re-education;Manual techniques;Passive range of motion;Vasopneumatic Device    PT Next Visit Plan strength and progress toward goals;  shoulder stability, advance strength as tolerated;  KX    PT Home Exercise Plan Access Code: [email or web address]           Patient will benefit from skilled therapeutic intervention in order to improve the following deficits and impairments:  Decreased activity tolerance, Decreased strength, Postural dysfunction, Impaired flexibility, Decreased scar mobility, Pain, Impaired UE functional use, Increased muscle spasms, Decreased endurance  Visit Diagnosis: Acute pain of right shoulder  Muscle weakness (generalized)  Stiffness of right shoulder, not elsewhere classified  Localized edema     Problem List Patient Active Problem List   Diagnosis Date Noted   Piriformis syndrome of left side 08/18/2019   Rotator cuff tear, right 05/29/2019   Closed fracture of distal clavicle 05/15/2019   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, [place], [place], [address] Phone: [phone]   Fax:  [phone]  Name: Cory Barnett MRN: [id number] Date of Birth: Aug 19, 1946

## 2020-04-18 ENCOUNTER — Other Ambulatory Visit: Payer: Self-pay

## 2020-04-18 ENCOUNTER — Ambulatory Visit: Payer: Medicare Other | Admitting: Physical Therapy

## 2020-04-18 DIAGNOSIS — M25511 Pain in right shoulder: Secondary | ICD-10-CM

## 2020-04-18 DIAGNOSIS — M25611 Stiffness of right shoulder, not elsewhere classified: Secondary | ICD-10-CM

## 2020-04-18 DIAGNOSIS — M6281 Muscle weakness (generalized): Secondary | ICD-10-CM

## 2020-04-18 DIAGNOSIS — R600 Localized edema: Secondary | ICD-10-CM

## 2020-04-18 NOTE — Therapy (Signed)
[facility] [address] [place], [place], [address] Phone: [phone]   Fax:  [phone]  Physical Therapy Treatment  Patient Details  Name: Cory Barnett MRN: [id number] Date of Birth: 12/04/1945 Referring Provider (PT): Tania Ade, MD   Encounter Date: 04/18/2020   PT End of Session - 04/18/20 1249    Visit Number 21    Date for PT Re-Evaluation 05/21/20    Authorization Type Medicare  KX    Progress Note Due on Visit 1    PT Start Time 0941    PT Stop Time 1024    PT Time Calculation (min) 43 min    Activity Tolerance Patient tolerated treatment well           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   IFG (impaired fasting glucose) 01/2016   Pre-diabetes    RBBB (right bundle branch block)    Rheumatic fever    age 24 or 45, in hospital for a month, out of school for a yr   Seasonal allergies    Squamous cell carcinoma     Past Surgical History:  Procedure Laterality Date   EYE SURGERY     right eye  cataract surgery with lens implant   NASAL SEPTUM SURGERY     NOSE SURGERY     REVERSE SHOULDER ARTHROPLASTY Right 01/11/2020   Procedure: REVERSE SHOULDER ARTHROPLASTY;  Surgeon: Tania Ade, MD;  Location: [facility];  Service: Orthopedics;  Laterality: Right;   SHOULDER ARTHROSCOPY WITH SUBACROMIAL DECOMPRESSION Right 08/07/2019   Procedure: SHOULDER ARTHROSCOPY WITH SUBACROMIAL DECOMPRESSION;  Surgeon: Tania Ade, MD;  Location: [facility];  Service: Orthopedics;  Laterality: Right;   SKIN CANCER EXCISION     basal and squamous cell    TONSILLECTOMY     VASECTOMY     WISDOM TOOTH EXTRACTION      There were no vitals filed for this visit.   Subjective  Assessment - 04/18/20 0943    Subjective I want to work on that behind the back motion.  I had some soreness after last session.    Pertinent History KX at visit 15    Currently in Pain? No/denies    Pain Score 0-No pain                             [facility] Adult PT Treatment/Exercise - 04/18/20 0001      Shoulder Exercises: Supine   Other Supine Exercises propped on wedge with holding towel elevation 15x    Other Supine Exercises attempted propped on incline PNF patterns but painful so discontinued       Shoulder Exercises: Standing   Internal Rotation Limitations golf club for internal rotation 15x       Shoulder Exercises: ROM/Strengthening   UBE (Upper Arm Bike) Level 2 x 4  minutes   PT present to monitor   Other ROM/Strengthening Exercises 4# lawnmower pulls 2x 10       Shoulder Exercises: Power Tower   Extension 20 reps    Extension Limitations 25# bar from shoulder height     Row 20 reps    Row  Limitations 25#    Internal Rotation 20 reps    Internal Rotation Limitations 10#    Other Power Tower Exercises triceps press: 30# 2x10      Vasopneumatic   Number Minutes Vasopneumatic  10 minutes    Vasopnuematic Location  Shoulder    Vasopneumatic Pressure Low    Vasopneumatic Temperature  3 snow flakes                    PT Short Term Goals - 04/16/20 2011      PT SHORT TERM GOAL #1   Title independent with initial HEP    Status Achieved      PT SHORT TERM GOAL #2   Status Achieved      PT SHORT TERM GOAL #3   Title demonstrate Rt shoulder A/ROM flexion to > or = to 70 degrees without scapular elevation to improve ability to use with ADLs    Status Achieved      PT SHORT TERM GOAL #4   Title demonstrate Rt shoulder P/ROM ER to > or = to 60 degrees to improve mobility needed for self-care    Status Achieved             PT Long Term Goals - 04/16/20 2012      PT LONG TERM GOAL #1   Title The patient will be independent in safe  self progression of HEP  for further improvements in ROM and strength    Time 8    Period Weeks    Status On-going      PT LONG TERM GOAL #2   Title The patient will have improved right shoulder A/ROM to 125 degrees needed for reaching eye level shelves and personal care    Status Achieved      PT LONG TERM GOAL #3   Title demonstrate 4/5 Rt shoulder flexion and IR to improve functional endurance for ADLs and self-care    Time 8    Period Weeks    Status On-going      PT LONG TERM GOAL #4   Title demonstrate Rt shoulder ER to C5 without scapular elevation to improve use with self-care    Time 8    Period Weeks    Status On-going      PT LONG TERM GOAL #5   Title demonstrate Rt shoulder A/ROM IR to L3 to improve independence and ease with dressing    Time 8    Period Weeks    Status On-going      PT LONG TERM GOAL #6   Title reduce FOTO to <  or = to 33% limitation    Time 8    Period Weeks    Status On-going                 Plan - 04/18/20 1250    Clinical Impression Statement The patient has residual soreness from last session 2 days ago.  He complains of functional difficulty with reaching behind the back (internal rotation).  We discussed methods to work on this ROM however recommend he do this in moderation secondary to history of instability.  He performs scapular, biceps and triceps strengthening without difficulty.  Therapist monitoring response and modifying for pain accordingly.    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    Examination-Activity Limitations Lift;Reach Overhead    Examination-Participation Restrictions Shop;Yard Work;Cleaning    Rehab Potential Good    PT Frequency 2x / week    PT  Duration 8 weeks    PT Treatment/Interventions ADLs/Self Care Home Management;Cryotherapy;Electrical Stimulation;Moist Heat;Iontophoresis 4mg /ml Dexamethasone;Functional mobility training;Therapeutic activities;Therapeutic exercise;Neuromuscular re-education;Manual  techniques;Passive range of motion;Vasopneumatic Device    PT Next Visit Plan strength and progress toward goals;  shoulder stability, advance strength as tolerated;  KX    PT Home Exercise Plan Access Code: [email or web address]           Patient will benefit from skilled therapeutic intervention in order to improve the following deficits and impairments:  Decreased activity tolerance, Decreased strength, Postural dysfunction, Impaired flexibility, Decreased scar mobility, Pain, Impaired UE functional use, Increased muscle spasms, Decreased endurance  Visit Diagnosis: Acute pain of right shoulder  Muscle weakness (generalized)  Stiffness of right shoulder, not elsewhere classified  Localized edema     Problem List Patient Active Problem List   Diagnosis Date Noted   Piriformis syndrome of left side 08/18/2019   Rotator cuff tear, right 05/29/2019   Closed fracture of distal clavicle 05/15/2019   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, [place], [place], [address] Phone: [phone]   Fax:  [phone]  Name: Cory Barnett MRN: [id number] Date of Birth: 05/25/1946

## 2020-04-23 ENCOUNTER — Other Ambulatory Visit: Payer: Self-pay

## 2020-04-23 ENCOUNTER — Ambulatory Visit: Payer: Medicare Other

## 2020-04-23 DIAGNOSIS — M25511 Pain in right shoulder: Secondary | ICD-10-CM | POA: Diagnosis not present

## 2020-04-23 DIAGNOSIS — M6281 Muscle weakness (generalized): Secondary | ICD-10-CM

## 2020-04-23 DIAGNOSIS — R600 Localized edema: Secondary | ICD-10-CM

## 2020-04-23 DIAGNOSIS — M25611 Stiffness of right shoulder, not elsewhere classified: Secondary | ICD-10-CM

## 2020-04-23 DIAGNOSIS — M25552 Pain in left hip: Secondary | ICD-10-CM

## 2020-04-23 NOTE — Therapy (Signed)
[facility] [address] [place], [place], [address] Phone: [phone]   Fax:  [phone]  Physical Therapy Treatment  Patient Details  Name: Cory Barnett MRN: [id number] Date of Birth: [birth year] Referring Provider (PT): Tania Ade, MD   Encounter Date: 04/23/2020   PT End of Session - 04/23/20 1016    Visit Number 22    Date for PT Re-Evaluation 05/21/20    Authorization Type Medicare  KX    Progress Note Due on Visit 30    PT Start Time 0935    PT Stop Time 1030    PT Time Calculation (min) 55 min    Activity Tolerance Patient tolerated treatment well    Behavior During Therapy [facility] for tasks assessed/performed           Past Medical History:  Diagnosis Date  . Anxiety   . AV block, 1st degree   . Basal cell carcinoma   . Cardiac murmur   . Cataract   . Elevated PSA measurement 2018  . Hyperlipidemia   . Hypertension 2015  . 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 IFG (impaired fasting glucose) 01/2016  . Pre-diabetes   . RBBB (right bundle branch block)   . Rheumatic fever    age 59 or 37, in hospital for a month, out of school for a yr  . Seasonal allergies   . Squamous cell carcinoma     Past Surgical History:  Procedure Laterality Date  . EYE SURGERY     right eye  cataract surgery with lens implant  . NASAL SEPTUM SURGERY    . NOSE SURGERY    . 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 SHOULDER ARTHROSCOPY WITH SUBACROMIAL DECOMPRESSION Right 08/07/2019   Procedure: SHOULDER ARTHROSCOPY WITH SUBACROMIAL DECOMPRESSION;  Surgeon: Tania Ade, MD;  Location: [place];  Service: Orthopedics;  Laterality: Right;  . SKIN CANCER EXCISION     basal and squamous cell   . TONSILLECTOMY    . VASECTOMY    . WISDOM TOOTH EXTRACTION       There were no vitals filed for this visit.   Subjective Assessment - 04/23/20 0939    Subjective I am doing well.    Pertinent History KX at visit 15    Currently in Pain? No/denies              [facility] PT Assessment - 04/23/20 0001      Assessment   Medical Diagnosis s/p Rt reverse total shoulder replacement      Strength   Right Shoulder Flexion 4/5    Right Shoulder Internal Rotation 4/5    Right Shoulder External Rotation 3-/5                         [facility] Adult PT Treatment/Exercise - 04/23/20 0001      Shoulder Exercises: Seated   Flexion Strengthening;Right;20 reps    Flexion Weight (lbs) 1    Abduction Strengthening;Right;20 reps    ABduction Weight (lbs) 1    Diagonals Strengthening;Right;20 reps    Diagonals Weight (lbs) 1      Shoulder Exercises: ROM/Strengthening   UBE (Upper Arm Bike) Level 2.5 x 6 minutes (3/3)      Shoulder Exercises: Power Development worker, community 20 reps  Extension Limitations 30# bar from shoulder height     Row 20 reps    Row Limitations 25#    Internal Rotation 20 reps    Internal Rotation Limitations 10#    Other Power Tower Exercises triceps press: 30# 2x10      Vasopneumatic   Number Minutes Vasopneumatic  10 minutes    Vasopnuematic Location  Shoulder    Vasopneumatic Pressure Low    Vasopneumatic Temperature  3 snow flakes                    PT Short Term Goals - 04/16/20 2011      PT SHORT TERM GOAL #1   Title independent with initial HEP    Status Achieved      PT SHORT TERM GOAL #2   Status Achieved      PT SHORT TERM GOAL #3   Title demonstrate Rt shoulder A/ROM flexion to > or = to 70 degrees without scapular elevation to improve ability to use with ADLs    Status Achieved      PT SHORT TERM GOAL #4   Title demonstrate Rt shoulder P/ROM ER to > or = to 60 degrees to improve mobility needed for self-care    Status Achieved             PT Long Term Goals - 04/23/20 0942       PT LONG TERM GOAL #1   Title The patient will be independent in safe self progression of HEP  for further improvements in ROM and strength    Time 8    Period Weeks    Status On-going                 Plan - 04/23/20 1003    Clinical Impression Statement Pt reports 80% functional use of the Rt UE with home and work tasks. Pt remains limited with overhead use, IR to reach behind the back and with heavy lifting.  Pt with improved strength and A/ROM in the Rt shoulder and demonstrates significant improvement in endurance. He performs scapular, shoulder biceps and triceps strengthening without difficulty.  Therapist monitoring response and modifying for pain accordingly.    PT Frequency 2x / week    PT Duration 8 weeks    PT Treatment/Interventions ADLs/Self Care Home Management;Cryotherapy;Electrical Stimulation;Moist Heat;Iontophoresis 4mg /ml Dexamethasone;Functional mobility training;Therapeutic activities;Therapeutic exercise;Neuromuscular re-education;Manual techniques;Passive range of motion;Vasopneumatic Device    PT Next Visit Plan strength and progress toward goals;  shoulder stability, advance strength as tolerated    PT Home Exercise Plan Access Code: [email or web address]    Consulted and Agree with Plan of Care Patient           Patient will benefit from skilled therapeutic intervention in order to improve the following deficits and impairments:  Decreased activity tolerance, Decreased strength, Postural dysfunction, Impaired flexibility, Decreased scar mobility, Pain, Impaired UE functional use, Increased muscle spasms, Decreased endurance  Visit Diagnosis: Acute pain of right shoulder  Muscle weakness (generalized)  Stiffness of right shoulder, not elsewhere classified  Localized edema  Pain in left hip     Problem List Patient Active Problem List   Diagnosis Date Noted  . Piriformis syndrome of left side 08/18/2019  . Rotator cuff tear, right 05/29/2019  . Closed  fracture of distal clavicle 05/15/2019  . 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  [place], [place], [address] Phone: [phone]   Fax:  [phone]  Name: Cory Barnett MRN: [id number] Date of Birth: 15-Jul-1946

## 2020-04-25 ENCOUNTER — Ambulatory Visit: Payer: Medicare Other

## 2020-04-25 ENCOUNTER — Other Ambulatory Visit: Payer: Self-pay

## 2020-04-25 DIAGNOSIS — R600 Localized edema: Secondary | ICD-10-CM

## 2020-04-25 DIAGNOSIS — M25511 Pain in right shoulder: Secondary | ICD-10-CM

## 2020-04-25 DIAGNOSIS — M25611 Stiffness of right shoulder, not elsewhere classified: Secondary | ICD-10-CM

## 2020-04-25 DIAGNOSIS — M6281 Muscle weakness (generalized): Secondary | ICD-10-CM

## 2020-04-25 NOTE — Therapy (Signed)
[facility] [address] [place], [place], [address] Phone: [phone]   Fax:  [phone]  Physical Therapy Treatment  Patient Details  Name: Cory Barnett MRN: [id number] Date of Birth: [birth year] Referring Provider (PT): Tania Ade, MD   Encounter Date: 04/25/2020   PT End of Session - 04/25/20 1020    Visit Number 23    Date for PT Re-Evaluation 05/21/20    Authorization Type Medicare  KX    Progress Note Due on Visit 66    PT Start Time [phone]   late- declined [employer]   PT Stop Time 1014    PT Time Calculation (min) 31 min    Activity Tolerance Patient tolerated treatment well    Behavior During Therapy [facility] for tasks assessed/performed           Past Medical History:  Diagnosis Date  . Anxiety   . AV block, 1st degree   . Basal cell carcinoma   . Cardiac murmur   . Cataract   . Elevated PSA measurement 2018  . Hyperlipidemia   . Hypertension 2015  . 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 IFG (impaired fasting glucose) 01/2016  . Pre-diabetes   . RBBB (right bundle branch block)   . Rheumatic fever    age 29 or 66, in hospital for a month, out of school for a yr  . Seasonal allergies   . Squamous cell carcinoma     Past Surgical History:  Procedure Laterality Date  . EYE SURGERY     right eye  cataract surgery with lens implant  . NASAL SEPTUM SURGERY    . NOSE SURGERY    . 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 SHOULDER ARTHROSCOPY WITH SUBACROMIAL DECOMPRESSION Right 08/07/2019   Procedure: SHOULDER ARTHROSCOPY WITH SUBACROMIAL DECOMPRESSION;  Surgeon: Tania Ade, MD;  Location: [facility];  Service: Orthopedics;  Laterality: Right;  . SKIN CANCER EXCISION     basal and squamous cell   . TONSILLECTOMY    . VASECTOMY    .  WISDOM TOOTH EXTRACTION      There were no vitals filed for this visit.                      [facility] Adult PT Treatment/Exercise - 04/25/20 0001      Shoulder Exercises: Seated   Flexion Strengthening;Right;20 reps    Flexion Weight (lbs) 1    Abduction Strengthening;Right;20 reps    ABduction Weight (lbs) 1    Diagonals Strengthening;Right;20 reps    Diagonals Weight (lbs) 1      Shoulder Exercises: Standing   Other Standing Exercises ball rolls on the wall 2x10 each direction      Shoulder Exercises: ROM/Strengthening   UBE (Upper Arm Bike) Level 3 x 6 minutes (3/3)      Shoulder Exercises: Power Development worker, community 20 reps    Extension Limitations 30# bar from shoulder height     Row 20 reps    Row Limitations 25#    Internal Rotation 20 reps    Internal Rotation Limitations 10#    Other Power Tower Exercises triceps press: 30# 2x10                    PT Short Term Goals - 04/16/20  2011      PT SHORT TERM GOAL #1   Title independent with initial HEP    Status Achieved      PT SHORT TERM GOAL #2   Status Achieved      PT SHORT TERM GOAL #3   Title demonstrate Rt shoulder A/ROM flexion to > or = to 70 degrees without scapular elevation to improve ability to use with ADLs    Status Achieved      PT SHORT TERM GOAL #4   Title demonstrate Rt shoulder P/ROM ER to > or = to 60 degrees to improve mobility needed for self-care    Status Achieved             PT Long Term Goals - 04/23/20 0942      PT LONG TERM GOAL #1   Title The patient will be independent in safe self progression of HEP  for further improvements in ROM and strength    Time 8    Period Weeks    Status On-going                 Plan - 04/25/20 1029    Clinical Impression Statement Pt reports 80% functional use of the Rt UE with home and work tasks. Pt remains limited with overhead use, IR to reach behind the back and with heavy lifting.  Pt with improved strength  and A/ROM in the Rt shoulder and demonstrates significant improvement in endurance. Pt with good form with 3 way raises with reduced scapular elevation.  Pt reports that he is able to lift light objects overhead and he feels weakness when he does this but he is able to perform it with help from the Lt UE. Pt requires intermittent tactile cues for scapular position to reduce elevation.  Session was shortened due to pt was late for appt.  Pt will continue to benefit from skilled PT to address Rt UE strength and endurance.    PT Frequency 2x / week    PT Duration 8 weeks    PT Treatment/Interventions ADLs/Self Care Home Management;Cryotherapy;Electrical Stimulation;Moist Heat;Iontophoresis 4mg /ml Dexamethasone;Functional mobility training;Therapeutic activities;Therapeutic exercise;Neuromuscular re-education;Manual techniques;Passive range of motion;Vasopneumatic Device    PT Next Visit Plan strength and progress toward goals;  shoulder stability, advance strength as tolerated    PT Home Exercise Plan Access Code: [email or web address]    Consulted and Agree with Plan of Care Patient           Patient will benefit from skilled therapeutic intervention in order to improve the following deficits and impairments:  Decreased activity tolerance, Decreased strength, Postural dysfunction, Impaired flexibility, Decreased scar mobility, Pain, Impaired UE functional use, Increased muscle spasms, Decreased endurance  Visit Diagnosis: Muscle weakness (generalized)  Acute pain of right shoulder  Stiffness of right shoulder, not elsewhere classified  Localized edema     Problem List Patient Active Problem List   Diagnosis Date Noted  . Piriformis syndrome of left side 08/18/2019  . Rotator cuff tear, right 05/29/2019  . Closed fracture of distal clavicle 05/15/2019  . 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 [place], [place], [address] Phone: [phone]   Fax:  [phone]  Name: Cory Barnett MRN: [id number] Date of Birth: 08/03/1946

## 2020-04-30 ENCOUNTER — Ambulatory Visit: Payer: Medicare Other | Admitting: Physical Therapy

## 2020-04-30 ENCOUNTER — Other Ambulatory Visit: Payer: Self-pay

## 2020-04-30 DIAGNOSIS — M25511 Pain in right shoulder: Secondary | ICD-10-CM | POA: Diagnosis not present

## 2020-04-30 DIAGNOSIS — M25611 Stiffness of right shoulder, not elsewhere classified: Secondary | ICD-10-CM

## 2020-04-30 DIAGNOSIS — M6281 Muscle weakness (generalized): Secondary | ICD-10-CM

## 2020-04-30 NOTE — Therapy (Signed)
[facility] [address], [place], [place], [address] Phone: [phone]   Fax:  [phone]  Physical Therapy Treatment  Patient Details  Name: Cory Barnett MRN: [id number] Date of Birth: 05-Jun-1946 Referring Provider (PT): Tania Ade, MD   Encounter Date: 04/30/2020   PT End of Session - 04/30/20 1000    Visit Number 24    Date for PT Re-Evaluation 05/21/20    Authorization Type Medicare  KX    Progress Note Due on Visit 30    PT Start Time 0930    PT Stop Time 1008    PT Time Calculation (min) 38 min    Activity Tolerance Patient tolerated treatment well           Past Medical History:  Diagnosis Date  . Anxiety   . AV block, 1st degree   . Basal cell carcinoma   . Cardiac murmur   . Cataract   . Elevated PSA measurement 2018  . Hyperlipidemia   . Hypertension 2015  . 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 IFG (impaired fasting glucose) 01/2016  . Pre-diabetes   . RBBB (right bundle branch block)   . Rheumatic fever    age 74 or 74, in hospital for a month, out of school for a yr  . Seasonal allergies   . Squamous cell carcinoma     Past Surgical History:  Procedure Laterality Date  . EYE SURGERY     right eye  cataract surgery with lens implant  . NASAL SEPTUM SURGERY    . NOSE SURGERY    . 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 SHOULDER ARTHROSCOPY WITH SUBACROMIAL DECOMPRESSION Right 08/07/2019   Procedure: SHOULDER ARTHROSCOPY WITH SUBACROMIAL DECOMPRESSION;  Surgeon: Tania Ade, MD;  Location: [facility];  Service: Orthopedics;  Laterality: Right;  . SKIN CANCER EXCISION     basal and squamous cell   . TONSILLECTOMY    . VASECTOMY    . WISDOM TOOTH EXTRACTION      There were no vitals filed for this visit.   Subjective  Assessment - 04/30/20 0933    Subjective My shoulder is doing pretty well.  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    Currently in Pain? No/denies    Pain Score 0-No pain    Pain Location Shoulder    Pain Orientation Right                             [facility] Adult PT Treatment/Exercise - 04/30/20 0001      Shoulder Exercises: Seated   Flexion Strengthening;Right;15 reps    Flexion Weight (lbs) 1    Abduction Strengthening;Right;15 reps    ABduction Weight (lbs) 1    Diagonals Strengthening;Right;10 reps    Diagonals Weight (lbs) 1      Shoulder Exercises: Standing   Other Standing Exercises ball rolls on the wall flex only 10x       Shoulder Exercises: ROM/Strengthening   UBE (Upper Arm Bike) Level 3 x 6 minutes (3/3)    Other ROM/Strengthening Exercises 5# lawnmower pulls 2x 10       Shoulder Exercises: Power Tower   Extension 20 reps    Extension Limitations 35# bar from shoulder height  Row 25 reps    Row Limitations 25#    Internal Rotation 25 reps    Internal Rotation Limitations 10#    Other Power Tower Exercises triceps press: 30# 25X     Other Power Tower Exercises biceps curl with bar at knee level 2x 10                    PT Short Term Goals - 04/16/20 2011      PT SHORT TERM GOAL #1   Title independent with initial HEP    Status Achieved      PT SHORT TERM GOAL #2   Status Achieved      PT SHORT TERM GOAL #3   Title demonstrate Rt shoulder A/ROM flexion to > or = to 70 degrees without scapular elevation to improve ability to use with ADLs    Status Achieved      PT SHORT TERM GOAL #4   Title demonstrate Rt shoulder P/ROM ER to > or = to 60 degrees to improve mobility needed for self-care    Status Achieved             PT Long Term Goals - 04/23/20 0942      PT LONG TERM GOAL #1   Title The patient will be independent in safe self progression of HEP  for further improvements in ROM and  strength    Time 8    Period Weeks    Status On-going                 Plan - 04/30/20 1427    Clinical Impression Statement The patient is able to perform limited amounts of elevation exercises although fatigues rather quickly and does produce more shoulder discomfort.  He is able to increased resistance with scapular strengthening ex's and/or increased repetitions.  Therapist closely monitoring response and modifying ex's based on increased pain levels or compensatory strategies.  He declines the need for cryotherapy/vasocompression.    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    Rehab Potential Good    PT Frequency 2x / week    PT Duration 8 weeks    PT Treatment/Interventions ADLs/Self Care Home Management;Cryotherapy;Electrical Stimulation;Moist Heat;Iontophoresis 4mg /ml Dexamethasone;Functional mobility training;Therapeutic activities;Therapeutic exercise;Neuromuscular re-education;Manual techniques;Passive range of motion;Vasopneumatic Device    PT Next Visit Plan strength and progress toward goals;  shoulder stability, advance strength as tolerated;  KX    PT Home Exercise Plan Access Code: [email or web address]           Patient will benefit from skilled therapeutic intervention in order to improve the following deficits and impairments:  Decreased activity tolerance, Decreased strength, Postural dysfunction, Impaired flexibility, Decreased scar mobility, Pain, Impaired UE functional use, Increased muscle spasms, Decreased endurance  Visit Diagnosis: Muscle weakness (generalized)  Acute pain of right shoulder  Stiffness of right shoulder, not elsewhere classified     Problem List Patient Active Problem List   Diagnosis Date Noted  . Piriformis syndrome of left side 08/18/2019  . Rotator cuff tear, right 05/29/2019  . Closed fracture of distal clavicle 05/15/2019  . 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   [name], PT 04/30/20 2:31 PM Phone: [phone] Fax:  [phone] [name] 04/30/2020, 2:31 PM  [facility] [address], [place], [place], [address] Phone: [phone]   Fax:  [phone]  Name: Cory Barnett MRN: [id number] Date of Birth: July 12, 1946

## 2020-05-02 ENCOUNTER — Other Ambulatory Visit: Payer: Self-pay

## 2020-05-02 ENCOUNTER — Ambulatory Visit: Payer: Medicare Other | Attending: Orthopedic Surgery | Admitting: Physical Therapy

## 2020-05-02 DIAGNOSIS — R600 Localized edema: Secondary | ICD-10-CM | POA: Diagnosis not present

## 2020-05-02 DIAGNOSIS — M25611 Stiffness of right shoulder, not elsewhere classified: Secondary | ICD-10-CM | POA: Diagnosis not present

## 2020-05-02 DIAGNOSIS — M6281 Muscle weakness (generalized): Secondary | ICD-10-CM | POA: Diagnosis not present

## 2020-05-02 DIAGNOSIS — M25511 Pain in right shoulder: Secondary | ICD-10-CM

## 2020-05-02 NOTE — Therapy (Signed)
[facility] [address] [place], [place], [address] Phone: [phone]   Fax:  [phone]  Physical Therapy Treatment  Patient Details  Name: Cory Barnett MRN: [id number] Date of Birth: 02-17-1946 Referring Provider (PT): Tania Ade, MD   Encounter Date: 05/02/2020   PT End of Session - 05/02/20 1730    Visit Number 25    Date for PT Re-Evaluation 05/21/20    Authorization Type Medicare  KX    Progress Note Due on Visit 60    PT Start Time [phone]   pt late   PT Stop Time 1012    PT Time Calculation (min) 34 min    Activity Tolerance Patient tolerated treatment well           Past Medical History:  Diagnosis Date  . Anxiety   . AV block, 1st degree   . Basal cell carcinoma   . Cardiac murmur   . Cataract   . Elevated PSA measurement 2018  . Hyperlipidemia   . Hypertension 2015  . 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 IFG (impaired fasting glucose) 01/2016  . Pre-diabetes   . RBBB (right bundle branch block)   . Rheumatic fever    age 51 or 63, in hospital for a month, out of school for a yr  . Seasonal allergies   . Squamous cell carcinoma     Past Surgical History:  Procedure Laterality Date  . EYE SURGERY     right eye  cataract surgery with lens implant  . NASAL SEPTUM SURGERY    . NOSE SURGERY    . 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 SHOULDER ARTHROSCOPY WITH SUBACROMIAL DECOMPRESSION Right 08/07/2019   Procedure: SHOULDER ARTHROSCOPY WITH SUBACROMIAL DECOMPRESSION;  Surgeon: Tania Ade, MD;  Location: [facility];  Service: Orthopedics;  Laterality: Right;  . SKIN CANCER EXCISION     basal and squamous cell   . TONSILLECTOMY    . VASECTOMY    . WISDOM TOOTH EXTRACTION      There were no vitals filed for this visit.    Subjective Assessment - 05/02/20 0941    Subjective Denies soreness after last session.    Pertinent History KX at visit 15    Currently in Pain? No/denies    Pain Score 0-No pain    Pain Location Shoulder    Pain Orientation Right                             [facility] Adult PT Treatment/Exercise - 05/02/20 0001      Shoulder Exercises: Supine   Other Supine Exercises propped on wedge 1# shoulder elevation 10x       Shoulder Exercises: Seated   Elevation --    Elevation Weight (lbs) --    Elevation Limitations --    Other Seated Exercises propped on wedge 1# ABC       Shoulder Exercises: Sidelying   Flexion Limitations 12x overhead    Other Sidelying Exercises 1# small arc abduction 80-100 degrees 12x    Other Sidelying Exercises 1# small arc horizontal abduction 1#       Shoulder Exercises: Standing   Other Standing Exercises kneeling on mat 3# extensions and horizontal abduction 10x     Other Standing Exercises red  band press forward 2x 5       Shoulder Exercises: ROM/Strengthening   UBE (Upper Arm Bike) Level 2 x 6 minutes (3/3)      Shoulder Exercises: Power [employer] 15 reps    Row Limitations 35#     Other Power Tower Exercises single arm triceps 30# 15x2; 35# 2nd Associate Professor pulls 30# 2x 15                    PT Short Term Goals - 04/16/20 2011      PT SHORT TERM GOAL #1   Title independent with initial HEP    Status Achieved      PT SHORT TERM GOAL #2   Status Achieved      PT SHORT TERM GOAL #3   Title demonstrate Rt shoulder A/ROM flexion to > or = to 70 degrees without scapular elevation to improve ability to use with ADLs    Status Achieved      PT SHORT TERM GOAL #4   Title demonstrate Rt shoulder P/ROM ER to > or = to 60 degrees to improve mobility needed for self-care    Status Achieved             PT Long Term Goals - 04/23/20 0942      PT LONG TERM GOAL #1   Title The patient  will be independent in safe self progression of HEP  for further improvements in ROM and strength    Time 8    Period Weeks    Status On-going                 Plan - 05/02/20 1731    Clinical Impression Statement The patient is able to continue with progressive right UE strengthening.   He fatigues rather quickly with flexion/elevation ex's even in semi-reclined "lawn chair" position.  He reports minor soreness but states "it's a good feeling" from working out.  Therapist monitoring his response including facial expressions and modifying intensity of ex accordingly.    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    Rehab Potential Good    PT Frequency 2x / week    PT Duration 8 weeks    PT Treatment/Interventions ADLs/Self Care Home Management;Cryotherapy;Electrical Stimulation;Moist Heat;Iontophoresis 4mg /ml Dexamethasone;Functional mobility training;Therapeutic activities;Therapeutic exercise;Neuromuscular re-education;Manual techniques;Passive range of motion;Vasopneumatic Device    PT Next Visit Plan strength and progress toward goals;  shoulder stability, advance strength as tolerated;  KX    PT Home Exercise Plan Access Code: [email or web address]           Patient will benefit from skilled therapeutic intervention in order to improve the following deficits and impairments:  Decreased activity tolerance, Decreased strength, Postural dysfunction, Impaired flexibility, Decreased scar mobility, Pain, Impaired UE functional use, Increased muscle spasms, Decreased endurance  Visit Diagnosis: Muscle weakness (generalized)  Acute pain of right shoulder  Stiffness of right shoulder, not elsewhere classified     Problem List Patient Active Problem List   Diagnosis Date Noted  . Piriformis syndrome of left side 08/18/2019  . Rotator cuff tear, right 05/29/2019  . Closed fracture of distal clavicle 05/15/2019  . 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   [name], PT 05/02/20 5:38 PM Phone:  [phone] Fax: [phone] [name] 05/02/2020, 5:38 PM  [facility] [address], [place], [place], [address] Phone: [phone]   Fax:  [phone]  Name: Cory Barnett  Cory Barnett MRN: [id number] Date of Birth: Mar 29, 1946

## 2020-05-03 DIAGNOSIS — Z1212 Encounter for screening for malignant neoplasm of rectum: Secondary | ICD-10-CM | POA: Diagnosis not present

## 2020-05-09 DIAGNOSIS — L821 Other seborrheic keratosis: Secondary | ICD-10-CM | POA: Diagnosis not present

## 2020-05-09 DIAGNOSIS — L570 Actinic keratosis: Secondary | ICD-10-CM | POA: Diagnosis not present

## 2020-05-14 ENCOUNTER — Ambulatory Visit: Payer: Medicare Other | Admitting: Physical Therapy

## 2020-05-14 ENCOUNTER — Other Ambulatory Visit: Payer: Self-pay

## 2020-05-14 DIAGNOSIS — M25511 Pain in right shoulder: Secondary | ICD-10-CM

## 2020-05-14 DIAGNOSIS — M25611 Stiffness of right shoulder, not elsewhere classified: Secondary | ICD-10-CM

## 2020-05-14 DIAGNOSIS — M6281 Muscle weakness (generalized): Secondary | ICD-10-CM

## 2020-05-14 DIAGNOSIS — R600 Localized edema: Secondary | ICD-10-CM

## 2020-05-14 NOTE — Therapy (Signed)
[facility] [address] [place], [place], [address] Phone: [phone]   Fax:  [phone]  Physical Therapy Treatment  Patient Details  Name: Cory Barnett MRN: [id number] Date of Birth: 1946-04-20 Referring Provider (PT): Tania Ade, MD   Encounter Date: 05/14/2020   PT End of Session - 05/14/20 1113    Visit Number 26    Date for PT Re-Evaluation 05/21/20    Authorization Type Medicare  KX    Progress Note Due on Visit 30    PT Start Time 1110   pt late   PT Stop Time 1200    PT Time Calculation (min) 50 min           Past Medical History:  Diagnosis Date  . Anxiety   . AV block, 1st degree   . Basal cell carcinoma   . Cardiac murmur   . Cataract   . Elevated PSA measurement 2018  . Hyperlipidemia   . Hypertension 2015  . 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 IFG (impaired fasting glucose) 01/2016  . Pre-diabetes   . RBBB (right bundle branch block)   . Rheumatic fever    age 20 or 87, in hospital for a month, out of school for a yr  . Seasonal allergies   . Squamous cell carcinoma     Past Surgical History:  Procedure Laterality Date  . EYE SURGERY     right eye  cataract surgery with lens implant  . NASAL SEPTUM SURGERY    . NOSE SURGERY    . 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 SHOULDER ARTHROSCOPY WITH SUBACROMIAL DECOMPRESSION Right 08/07/2019   Procedure: SHOULDER ARTHROSCOPY WITH SUBACROMIAL DECOMPRESSION;  Surgeon: Tania Ade, MD;  Location: [place];  Service: Orthopedics;  Laterality: Right;  . SKIN CANCER EXCISION     basal and squamous cell   . TONSILLECTOMY    . VASECTOMY    . WISDOM TOOTH EXTRACTION      There were no vitals filed for this visit.   Subjective Assessment - 05/14/20 1112    Subjective  Shoulder is doing fine.  It has popped out of joint a couple of times overnight.  But overall it is doing well.  I went to [employer]    Pertinent History KX at visit 15    Currently in Pain? No/denies    Pain Score 0-No pain    Pain Location Shoulder    Pain Orientation Right                             [facility] Adult PT Treatment/Exercise - 05/14/20 0001      Shoulder Exercises: Supine   Other Supine Exercises propped on wedge  shoulder elevation 10x no weight; 1# small arcs overhead 10x. 1# small arc side to side 10x     Other Supine Exercises propped on wedge ABCs 2#       Shoulder Exercises: Sidelying   Flexion Limitations 20x overhead    Other Sidelying Exercises 2# small arc abduction 80-100 degrees 20x    Other Sidelying Exercises 2# small arc horizontal abduction 2# 20x       Shoulder Exercises: Standing   External Rotation Strengthening;Right;20 reps;Theraband    Theraband Level (Shoulder External Rotation) Level 2 (Red)  Flexion Strengthening;Right;20 reps;Theraband    Theraband Level (Shoulder Flexion) Level 2 (Red)    Flexion Limitations press forward 70 degrees      Shoulder Exercises: ROM/Strengthening   UBE (Upper Arm Bike) Level 2 x 6 minutes (3/3)      Shoulder Exercises: Power Development worker, community 20 reps    Extension Limitations 35# single arm     Row 20 reps    Row Limitations 35#     Other Power Engineer, water triceps 35# 2x 10       Vasopneumatic   Number Minutes Vasopneumatic  10 minutes    Vasopnuematic Location  Shoulder    Vasopneumatic Pressure Low    Vasopneumatic Temperature  3 snow flakes                    PT Short Term Goals - 04/16/20 2011      PT SHORT TERM GOAL #1   Title independent with initial HEP    Status Achieved      PT SHORT TERM GOAL #2   Status Achieved      PT SHORT TERM GOAL #3   Title demonstrate Rt shoulder A/ROM flexion to > or = to 70 degrees without scapular elevation to improve  ability to use with ADLs    Status Achieved      PT SHORT TERM GOAL #4   Title demonstrate Rt shoulder P/ROM ER to > or = to 60 degrees to improve mobility needed for self-care    Status Achieved             PT Long Term Goals - 04/23/20 0942      PT LONG TERM GOAL #1   Title The patient will be independent in safe self progression of HEP  for further improvements in ROM and strength    Time 8    Period Weeks    Status On-going                 Plan - 05/14/20 1625    Clinical Impression Statement The patient is able to increase resistance with semi-reclined "lawn chair" position with elevation and stabilization type ex's.  He is also able to peform increased reps with scapular muscle strengthening.  Pain level overall remains low with minor discomfort with eccentric lowering from an elevated position.  Therapist monitoring response including facial expressions to help determine appropriate progression.    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    Examination-Activity Limitations Lift;Reach Overhead    Examination-Participation Restrictions Shop;Yard Work;Cleaning    Rehab Potential Good    PT Frequency 2x / week    PT Duration 8 weeks    PT Treatment/Interventions ADLs/Self Care Home Management;Cryotherapy;Electrical Stimulation;Moist Heat;Iontophoresis 4mg /ml Dexamethasone;Functional mobility training;Therapeutic activities;Therapeutic exercise;Neuromuscular re-education;Manual techniques;Passive range of motion;Vasopneumatic Device    PT Next Visit Plan strength and progress toward goals;  shoulder stability, advance strength as tolerated;  KX    PT Home Exercise Plan Access Code: [email or web address]           Patient will benefit from skilled therapeutic intervention in order to improve the following deficits and impairments:  Decreased activity tolerance, Decreased strength, Postural dysfunction, Impaired flexibility, Decreased scar mobility, Pain, Impaired UE functional use, Increased  muscle spasms, Decreased endurance  Visit Diagnosis: Muscle weakness (generalized)  Acute pain of right shoulder  Stiffness of right shoulder, not elsewhere classified  Localized edema     Problem List Patient Active Problem List   Diagnosis Date Noted  .  Piriformis syndrome of left side 08/18/2019  . Rotator cuff tear, right 05/29/2019  . Closed fracture of distal clavicle 05/15/2019  . 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   [name], PT 05/14/20 4:29 PM Phone: [phone] Fax: [phone] [name] 05/14/2020, 4:29 PM  [facility] [address] [place], [place], [address] Phone: [phone]   Fax:  [phone]  Name: Cory Barnett MRN: [id number] Date of Birth: 11-Nov-1945

## 2020-05-23 ENCOUNTER — Other Ambulatory Visit: Payer: Self-pay

## 2020-05-23 ENCOUNTER — Ambulatory Visit: Payer: Medicare Other

## 2020-05-23 DIAGNOSIS — M25611 Stiffness of right shoulder, not elsewhere classified: Secondary | ICD-10-CM

## 2020-05-23 DIAGNOSIS — R600 Localized edema: Secondary | ICD-10-CM

## 2020-05-23 DIAGNOSIS — M25511 Pain in right shoulder: Secondary | ICD-10-CM | POA: Diagnosis not present

## 2020-05-23 DIAGNOSIS — M6281 Muscle weakness (generalized): Secondary | ICD-10-CM | POA: Diagnosis not present

## 2020-05-23 NOTE — Therapy (Signed)
[facility] [address] [place], [place], [address] Phone: [phone]   Fax:  [phone]  Physical Therapy Treatment  Patient Details  Name: Cory Barnett MRN: [id number] Date of Birth: 04/07/1946 Referring Provider (PT): Tania Ade, MD   Encounter Date: 05/23/2020   PT End of Session - 05/23/20 0818    Visit Number 27    Date for PT Re-Evaluation 07/04/20    Authorization Type Medicare  KX    Progress Note Due on Visit 30    PT Start Time 0733    PT Stop Time 0824    PT Time Calculation (min) 51 min    Activity Tolerance Patient tolerated treatment well    Behavior During Therapy [facility] for tasks assessed/performed           Past Medical History:  Diagnosis Date  . Anxiety   . AV block, 1st degree   . Basal cell carcinoma   . Cardiac murmur   . Cataract   . Elevated PSA measurement 2018  . Hyperlipidemia   . Hypertension 2015  . 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 IFG (impaired fasting glucose) 01/2016  . Pre-diabetes   . RBBB (right bundle branch block)   . Rheumatic fever    age 74 or 74, in hospital for a month, out of school for a yr  . Seasonal allergies   . Squamous cell carcinoma     Past Surgical History:  Procedure Laterality Date  . EYE SURGERY     right eye  cataract surgery with lens implant  . NASAL SEPTUM SURGERY    . NOSE SURGERY    . 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 SHOULDER ARTHROSCOPY WITH SUBACROMIAL DECOMPRESSION Right 08/07/2019   Procedure: SHOULDER ARTHROSCOPY WITH SUBACROMIAL DECOMPRESSION;  Surgeon: Tania Ade, MD;  Location: [facility];  Service: Orthopedics;  Laterality: Right;  . SKIN CANCER EXCISION     basal and squamous cell   . TONSILLECTOMY    . VASECTOMY    . WISDOM TOOTH EXTRACTION       There were no vitals filed for this visit.   Subjective Assessment - 05/23/20 0740    Subjective I am working on my strength and ROM.  I will be on vacation next week.    Patient Stated Goals raise arm overhead, improve strength of Rt UE    Currently in Pain? No/denies              [facility] PT Assessment - 05/23/20 0001      Assessment   Medical Diagnosis s/p Rt reverse total shoulder replacement    Referring Provider (PT) Tania Ade, MD    Onset Date/Surgical Date 01/11/20      Prior Function   Level of Independence Independent    Vocation Part time employment      Observation/Other Assessments   Focus on Therapeutic Outcomes (FOTO)  35% limitation      Posture/Postural Control   Posture/Postural Control Postural limitations      AROM   Right Shoulder Flexion 115 Degrees   with scapular elevation above 115   Right Shoulder ABduction 108 Degrees    Right Shoulder Internal Rotation --   L2 with walking up the back   Right Shoulder External Rotation --   T2 with some assistance on back  of the neck     Strength   Right Shoulder Flexion 4+/5    Right Shoulder ABduction 4/5    Right Shoulder Internal Rotation 4+/5    Right Shoulder External Rotation 3+/5                         [facility] Adult PT Treatment/Exercise - 05/23/20 0001      Shoulder Exercises: Sidelying   Flexion Limitations 20x overhead    Other Sidelying Exercises 2# small arc abduction 80-100 degrees 20x    Other Sidelying Exercises 2# small arc horizontal abduction 2# 20x       Shoulder Exercises: Standing   External Rotation Strengthening;Right;20 reps;Theraband    Theraband Level (Shoulder External Rotation) Level 2 (Red)      Shoulder Exercises: ROM/Strengthening   UBE (Upper Arm Bike) Level 2.5 x 6 minutes (3/3)      Shoulder Exercises: Power Development worker, community 20 reps    Extension Limitations 35# single arm     Row 20 reps    Row Limitations 35#     Other Power Camera operator triceps 35# 2x 10       Vasopneumatic   Number Minutes Vasopneumatic  10 minutes    Vasopnuematic Location  Shoulder    Vasopneumatic Pressure Low    Vasopneumatic Temperature  3 snow flakes                    PT Short Term Goals - 04/16/20 2011      PT SHORT TERM GOAL #1   Title independent with initial HEP    Status Achieved      PT SHORT TERM GOAL #2   Status Achieved      PT SHORT TERM GOAL #3   Title demonstrate Rt shoulder A/ROM flexion to > or = to 70 degrees without scapular elevation to improve ability to use with ADLs    Status Achieved      PT SHORT TERM GOAL #4   Title demonstrate Rt shoulder P/ROM ER to > or = to 60 degrees to improve mobility needed for self-care    Status Achieved             PT Long Term Goals - 05/23/20 0742      PT LONG TERM GOAL #1   Title The patient will be independent in safe self progression of HEP  for further improvements in ROM and strength    Time 6    Period Weeks    Status On-going    Target Date 07/04/20      PT LONG TERM GOAL #2   Title The patient will have improved right shoulder A/ROM to 125 degrees needed for reaching eye level shelves and personal care    Baseline 115    Time 6    Period Weeks    Status On-going    Target Date 07/04/20      PT LONG TERM GOAL #3   Title demonstrate 4/5 Rt shoulder flexion and IR to improve functional endurance for ADLs and self-care    Baseline 4+/5 flexion, 4/5 abduction, 3+/5 ER, 4+/5 IR    Time 5    Period Weeks    Status On-going    Target Date 07/04/20      PT LONG TERM GOAL #4   Title demonstrate Rt shoulder ER to C5 without scapular elevation to improve use with self-care    Baseline  to T2 with some scapular elevation    Status Achieved      PT LONG TERM GOAL #5   Title demonstrate Rt shoulder A/ROM IR to L3 to improve independence and ease with dressing    Baseline walks up the back to L2    Time 6    Period Weeks    Status On-going     Target Date 07/04/20      PT LONG TERM GOAL #6   Title reduce FOTO to <  or = to 33% limitation    Baseline 35% limitation    Time 6    Period Weeks    Status On-going                 Plan - 05/23/20 0816    Clinical Impression Statement Pt is making steady progress s/p Rt total shoulder replacement.  The patient is able to increase resistance and reps with exercise.  Rt shoulder A/ROM and strength are improving and pt is limited with endurance tasks and lifting due to strength limitations. Pt with A/ROM with some substitution and use of "walking" to get into IR and ER actively.   Pt remains challenged with ER with resistance.  FOTO is improved to 35% limitation.   Pain level overall remains low with functional tasks and exercise.  Pt will continue to benefit from skilled PT 1x/wk to continue to increase strength and endurance of Rt UE and functional A/ROM.    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    Rehab Potential Good    PT Frequency 1x / week    PT Duration 6 weeks    PT Treatment/Interventions ADLs/Self Care Home Management;Cryotherapy;Electrical Stimulation;Moist Heat;Iontophoresis 4mg /ml Dexamethasone;Functional mobility training;Therapeutic activities;Therapeutic exercise;Neuromuscular re-education;Manual techniques;Passive range of motion;Vasopneumatic Device    PT Next Visit Plan strength and progress toward goals;  shoulder stability, advance strength as tolerated;  KX    PT Home Exercise Plan Access Code: [email or web address]    Recommended Other Services recert sent 6/00/45           Patient will benefit from skilled therapeutic intervention in order to improve the following deficits and impairments:  Decreased activity tolerance, Decreased strength, Postural dysfunction, Impaired flexibility, Decreased scar mobility, Pain, Impaired UE functional use, Increased muscle spasms, Decreased endurance  Visit Diagnosis: Muscle weakness (generalized) - Plan: PT plan of care cert/re-cert  Acute  pain of right shoulder - Plan: PT plan of care cert/re-cert  Stiffness of right shoulder, not elsewhere classified - Plan: PT plan of care cert/re-cert  Localized edema - Plan: PT plan of care cert/re-cert     Problem List Patient Active Problem List   Diagnosis Date Noted  . Piriformis syndrome of left side 08/18/2019  . Rotator cuff tear, right 05/29/2019  . Closed fracture of distal clavicle 05/15/2019  . 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     Sigurd Sos, PT 05/23/20 8:23 AM  [facility] [address] [place], [place], [address] Phone: [phone]   Fax:  [phone]  Name: JARMARCUS WAMBOLD MRN: [id number] Date of Birth: 06-18-1946

## 2020-06-04 ENCOUNTER — Other Ambulatory Visit: Payer: Self-pay

## 2020-06-04 ENCOUNTER — Encounter: Payer: Self-pay | Admitting: Physical Therapy

## 2020-06-04 ENCOUNTER — Ambulatory Visit: Payer: Medicare Other | Attending: Orthopedic Surgery | Admitting: Physical Therapy

## 2020-06-04 DIAGNOSIS — M25511 Pain in right shoulder: Secondary | ICD-10-CM | POA: Diagnosis not present

## 2020-06-04 DIAGNOSIS — M6281 Muscle weakness (generalized): Secondary | ICD-10-CM | POA: Diagnosis not present

## 2020-06-04 DIAGNOSIS — M25611 Stiffness of right shoulder, not elsewhere classified: Secondary | ICD-10-CM | POA: Diagnosis not present

## 2020-06-04 DIAGNOSIS — R600 Localized edema: Secondary | ICD-10-CM | POA: Diagnosis not present

## 2020-06-04 NOTE — Therapy (Signed)
[facility] [address], [place], [place], [address] Phone: [phone]   Fax:  [phone]  Physical Therapy Treatment  Patient Details  Name: Cory Barnett MRN: [id number] Date of Birth: February 12, 1946 Referring Provider (PT): Tania Ade, MD   Encounter Date: 06/04/2020   PT End of Session - 06/04/20 0844    Visit Number 28    Date for PT Re-Evaluation 07/04/20    Authorization Type Medicare  KX    Progress Note Due on Visit 46    PT Start Time 0806    PT Stop Time 0846    PT Time Calculation (min) 40 min    Activity Tolerance Patient tolerated treatment well           Past Medical History:  Diagnosis Date  . Anxiety   . AV block, 1st degree   . Basal cell carcinoma   . Cardiac murmur   . Cataract   . Elevated PSA measurement 2018  . Hyperlipidemia   . Hypertension 2015  . 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 IFG (impaired fasting glucose) 01/2016  . Pre-diabetes   . RBBB (right bundle branch block)   . Rheumatic fever    age 6 or 78, in hospital for a month, out of school for a yr  . Seasonal allergies   . Squamous cell carcinoma     Past Surgical History:  Procedure Laterality Date  . EYE SURGERY     right eye  cataract surgery with lens implant  . NASAL SEPTUM SURGERY    . NOSE SURGERY    . 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 SHOULDER ARTHROSCOPY WITH SUBACROMIAL DECOMPRESSION Right 08/07/2019   Procedure: SHOULDER ARTHROSCOPY WITH SUBACROMIAL DECOMPRESSION;  Surgeon: Tania Ade, MD;  Location: [facility];  Service: Orthopedics;  Laterality: Right;  . SKIN CANCER EXCISION     basal and squamous cell   . TONSILLECTOMY    . VASECTOMY    . WISDOM TOOTH EXTRACTION      There were no vitals filed for this visit.   Subjective  Assessment - 06/04/20 0842    Subjective [place] the beach was [place].    When I sleep on my left side I wake up with that right shoulder out of place.  It pops back in easily and without pain.  I'm going to start working with a trainer at the gym.    Patient Stated Goals raise arm overhead, improve strength of Rt UE    Currently in Pain? No/denies    Pain Score 0-No pain    Pain Location Shoulder                             [facility] Adult PT Treatment/Exercise - 06/04/20 0001      Shoulder Exercises: Supine   Flexion AROM;Right    Flexion Limitations propped on wedge    Other Supine Exercises propped on wedge  shoulder elevation 10x no weight; 2# small arcs overhead 10x. 2# small arc side to side 10x     Other Supine Exercises propped on wedge ABCs 2#       Shoulder Exercises: Sidelying   Flexion Limitations 20x overhead    Other Sidelying Exercises 2# small arc abduction 80-100 degrees 20x  Other Sidelying Exercises 2# small arc horizontal abduction 2# 20x       Shoulder Exercises: Standing   External Rotation Strengthening;Right;20 reps;Theraband    Theraband Level (Shoulder External Rotation) Level 2 (Red)    External Rotation Limitations very small arc    Internal Rotation Strengthening;Right;15 reps;Theraband    Theraband Level (Shoulder Internal Rotation) Level 2 (Red)    Flexion Strengthening;Right;20 reps;Theraband    Theraband Level (Shoulder Flexion) Level 2 (Red)    Flexion Limitations press forward 90 degrees    Other Standing Exercises counter top push ups 12x      Shoulder Exercises: ROM/Strengthening   UBE (Upper Arm Bike) Level 2.5 x 6 minutes (3/3)      Shoulder Exercises: Power Development worker, community 20 reps    Extension Limitations 35# single arm     Row 20 reps    Row Limitations 35#     Other Power Engineer, water triceps 35# 2x 10     Other Power Tower Exercises biceps 35# 20x       Vasopneumatic   Number Minutes Vasopneumatic  10  minutes    Vasopnuematic Location  Shoulder    Vasopneumatic Pressure Low    Vasopneumatic Temperature  3 snow flakes                    PT Short Term Goals - 04/16/20 2011      PT SHORT TERM GOAL #1   Title independent with initial HEP    Status Achieved      PT SHORT TERM GOAL #2   Status Achieved      PT SHORT TERM GOAL #3   Title demonstrate Rt shoulder A/ROM flexion to > or = to 70 degrees without scapular elevation to improve ability to use with ADLs    Status Achieved      PT SHORT TERM GOAL #4   Title demonstrate Rt shoulder P/ROM ER to > or = to 60 degrees to improve mobility needed for self-care    Status Achieved             PT Long Term Goals - 05/23/20 0742      PT LONG TERM GOAL #1   Title The patient will be independent in safe self progression of HEP  for further improvements in ROM and strength    Time 6    Period Weeks    Status On-going    Target Date 07/04/20      PT LONG TERM GOAL #2   Title The patient will have improved right shoulder A/ROM to 125 degrees needed for reaching eye level shelves and personal care    Baseline 115    Time 6    Period Weeks    Status On-going    Target Date 07/04/20      PT LONG TERM GOAL #3   Title demonstrate 4/5 Rt shoulder flexion and IR to improve functional endurance for ADLs and self-care    Baseline 4+/5 flexion, 4/5 abduction, 3+/5 ER, 4+/5 IR    Time 5    Period Weeks    Status On-going    Target Date 07/04/20      PT LONG TERM GOAL #4   Title demonstrate Rt shoulder ER to C5 without scapular elevation to improve use with self-care    Baseline to T2 with some scapular elevation    Status Achieved      PT LONG TERM GOAL #5   Title  demonstrate Rt shoulder A/ROM IR to L3 to improve independence and ease with dressing    Baseline walks up the back to L2    Time 6    Period Weeks    Status On-going    Target Date 07/04/20      PT LONG TERM GOAL #6   Title reduce FOTO to <  or = to 33%  limitation    Baseline 35% limitation    Time 6    Period Weeks    Status On-going                 Plan - 06/04/20 0844    Clinical Impression Statement The patient is improving with upper quarter strength and overall stability.  He plans to work with an Product/process development scientist at [employer] at some point and we discussed precautions/exs to avoid (no bench press per MD).  He is able to perform ex's with muscular fatigue and only mild discomfort reported. Pain well-controlled with vasocompression.  Therapist monitoring response with all interventions and modifying accordingly.  He is on track to meet remaining goals in next 3-4 visits.    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    Examination-Activity Limitations Lift;Reach Overhead    Examination-Participation Restrictions Shop;Yard Work;Cleaning    Stability/Clinical Decision Making Stable/Uncomplicated    Rehab Potential Good    PT Frequency 1x / week    PT Duration 6 weeks    PT Treatment/Interventions ADLs/Self Care Home Management;Cryotherapy;Electrical Stimulation;Moist Heat;Iontophoresis 4mg /ml Dexamethasone;Functional mobility training;Therapeutic activities;Therapeutic exercise;Neuromuscular re-education;Manual techniques;Passive range of motion;Vasopneumatic Device    PT Next Visit Plan strength and progress toward goals;  shoulder stability, advance strength as tolerated;  KX;  discharge in 3-4 more visits    PT Home Exercise Plan Access Code: [email or web address]           Patient will benefit from skilled therapeutic intervention in order to improve the following deficits and impairments:  Decreased activity tolerance, Decreased strength, Postural dysfunction, Impaired flexibility, Decreased scar mobility, Pain, Impaired UE functional use, Increased muscle spasms, Decreased endurance  Visit Diagnosis: Muscle weakness (generalized)  Acute pain of right shoulder  Stiffness of right shoulder, not elsewhere classified     Problem List Patient  Active Problem List   Diagnosis Date Noted  . Piriformis syndrome of left side 08/18/2019  . Rotator cuff tear, right 05/29/2019  . Closed fracture of distal clavicle 05/15/2019  . 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   [name], PT 06/04/20 5:03 PM Phone: [phone] Fax: [phone] [name] 06/04/2020, 5:03 PM  [facility] [address] [place], [place], [address] Phone: [phone]   Fax:  [phone]  Name: Cory Barnett MRN: [id number] Date of Birth: 1946-09-30

## 2020-06-05 DIAGNOSIS — H1820 Unspecified corneal edema: Secondary | ICD-10-CM | POA: Diagnosis not present

## 2020-06-05 DIAGNOSIS — H1789 Other corneal scars and opacities: Secondary | ICD-10-CM | POA: Diagnosis not present

## 2020-06-13 ENCOUNTER — Ambulatory Visit: Payer: Medicare Other

## 2020-06-13 ENCOUNTER — Other Ambulatory Visit: Payer: Self-pay

## 2020-06-13 DIAGNOSIS — M6281 Muscle weakness (generalized): Secondary | ICD-10-CM | POA: Diagnosis not present

## 2020-06-13 DIAGNOSIS — M25511 Pain in right shoulder: Secondary | ICD-10-CM

## 2020-06-13 DIAGNOSIS — R600 Localized edema: Secondary | ICD-10-CM | POA: Diagnosis not present

## 2020-06-13 DIAGNOSIS — M25611 Stiffness of right shoulder, not elsewhere classified: Secondary | ICD-10-CM

## 2020-06-13 NOTE — Therapy (Signed)
[facility] [address] [place], [place], [address] Phone: [phone]   Fax:  [phone]  Physical Therapy Treatment  Patient Details  Name: Cory Barnett MRN: [id number] Date of Birth: 1946/03/30 Referring Provider (PT): Tania Ade, MD   Encounter Date: 06/13/2020   PT End of Session - 06/13/20 0847    Visit Number 29    Date for PT Re-Evaluation 07/04/20    Authorization Type Medicare  KX    Progress Note Due on Visit 84    PT Start Time 0804    PT Stop Time 0855    PT Time Calculation (min) 51 min    Activity Tolerance Patient tolerated treatment well    Behavior During Therapy [facility] for tasks assessed/performed           Past Medical History:  Diagnosis Date  . Anxiety   . AV block, 1st degree   . Basal cell carcinoma   . Cardiac murmur   . Cataract   . Elevated PSA measurement 2018  . Hyperlipidemia   . Hypertension 2015  . 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 IFG (impaired fasting glucose) 01/2016  . Pre-diabetes   . RBBB (right bundle branch block)   . Rheumatic fever    age 30 or 82, in hospital for a month, out of school for a yr  . Seasonal allergies   . Squamous cell carcinoma     Past Surgical History:  Procedure Laterality Date  . EYE SURGERY     right eye  cataract surgery with lens implant  . NASAL SEPTUM SURGERY    . NOSE SURGERY    . 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 SHOULDER ARTHROSCOPY WITH SUBACROMIAL DECOMPRESSION Right 08/07/2019   Procedure: SHOULDER ARTHROSCOPY WITH SUBACROMIAL DECOMPRESSION;  Surgeon: Tania Ade, MD;  Location: [place];  Service: Orthopedics;  Laterality: Right;  . SKIN CANCER EXCISION     basal and squamous cell   . TONSILLECTOMY    . VASECTOMY    . WISDOM TOOTH EXTRACTION       There were no vitals filed for this visit.   Subjective Assessment - 06/13/20 0813    Subjective My shoulder is doing well.    Patient Stated Goals raise arm overhead, improve strength of Rt UE    Currently in Pain? No/denies              [facility] PT Assessment - 06/13/20 0001      Assessment   Medical Diagnosis s/p Rt reverse total shoulder replacement    Referring Provider (PT) Tania Ade, MD    Onset Date/Surgical Date 01/11/20                         [facility] Adult PT Treatment/Exercise - 06/13/20 0001      Shoulder Exercises: Sidelying   Other Sidelying Exercises 2# small arc horizontal abduction 2# 20x       Shoulder Exercises: Standing   Other Standing Exercises wall push ups on red ball 2x10    Other Standing Exercises ball circles with arm higher on wall >90 x20 each      Shoulder Exercises: ROM/Strengthening   UBE (Upper Arm Bike) Level 2.5 x 6 minutes (3/3)      Shoulder Exercises: Power [employer]  Extension 20 reps    Extension Limitations 35# single arm     Row 20 reps    Row Limitations 35#     Other Power Tower Exercises triceps 35# 2x 10     Other Power Tower Exercises biceps 35# 20x       Vasopneumatic   Number Minutes Vasopneumatic  10 minutes    Vasopnuematic Location  Shoulder    Vasopneumatic Pressure Medium    Vasopneumatic Temperature  3 snow flakes                    PT Short Term Goals - 04/16/20 2011      PT SHORT TERM GOAL #1   Title independent with initial HEP    Status Achieved      PT SHORT TERM GOAL #2   Status Achieved      PT SHORT TERM GOAL #3   Title demonstrate Rt shoulder A/ROM flexion to > or = to 70 degrees without scapular elevation to improve ability to use with ADLs    Status Achieved      PT SHORT TERM GOAL #4   Title demonstrate Rt shoulder P/ROM ER to > or = to 60 degrees to improve mobility needed for self-care    Status Achieved             PT Long Term Goals - 06/13/20  0847      PT LONG TERM GOAL #1   Title The patient will be independent in safe self progression of HEP  for further improvements in ROM and strength    Time 4    Period Weeks    Status On-going      PT LONG TERM GOAL #2   Title The patient will have improved right shoulder A/ROM to 125 degrees needed for reaching eye level shelves and personal care    Time 4    Period Weeks    Status On-going      PT LONG TERM GOAL #3   Title demonstrate 4/5 Rt shoulder flexion and IR to improve functional endurance for ADLs and self-care    Time 4    Status On-going                 Plan - 06/13/20 0845    Clinical Impression Statement The patient is improving with upper quarter strength and overall stability.  Pt has been working with a Clinical research associate at [employer] and is alternating arm and legs each vist.    He is able to perform ex's with muscular fatigue and only mild discomfort reported.  Pt tolerated strength progression well. Pt with palpable trigger point in Rt lats and PT advised pt to stretch this at home and we will dry needle next time.  Pain well-controlled with vasocompression post session.  Therapist monitoring response with all interventions and modifying accordingly.  He is on track to meet remaining goals in next 2-3 visits.    PT Frequency 1x / week    PT Duration 4 weeks    PT Treatment/Interventions ADLs/Self Care Home Management;Cryotherapy;Electrical Stimulation;Moist Heat;Iontophoresis 4mg /ml Dexamethasone;Functional mobility training;Therapeutic activities;Therapeutic exercise;Neuromuscular re-education;Manual techniques;Passive range of motion;Vasopneumatic Device;Dry needling    PT Next Visit Plan Dry needling to Rt lat (trigger point), continue Rt UE strength    PT Home Exercise Plan Access Code: [email or web address]    Recommended Other Services recert sent to add DN 1/61/09    Consulted and Agree with Plan of Care Patient  Patient will benefit from skilled therapeutic  intervention in order to improve the following deficits and impairments:  Decreased activity tolerance, Decreased strength, Postural dysfunction, Impaired flexibility, Decreased scar mobility, Pain, Impaired UE functional use, Increased muscle spasms, Decreased endurance  Visit Diagnosis: Acute pain of right shoulder - Plan: PT plan of care cert/re-cert  Muscle weakness (generalized) - Plan: PT plan of care cert/re-cert  Stiffness of right shoulder, not elsewhere classified - Plan: PT plan of care cert/re-cert  Localized edema - Plan: PT plan of care cert/re-cert     Problem List Patient Active Problem List   Diagnosis Date Noted  . Piriformis syndrome of left side 08/18/2019  . Rotator cuff tear, right 05/29/2019  . Closed fracture of distal clavicle 05/15/2019  . 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    Sigurd Sos, PT 06/13/20 9:11 AM  [facility] [address] [place], [place], [address] Phone: [phone]   Fax:  [phone]  Name: Cory Barnett MRN: [id number] Date of Birth: 1946-07-29

## 2020-06-20 ENCOUNTER — Other Ambulatory Visit: Payer: Self-pay

## 2020-06-20 ENCOUNTER — Ambulatory Visit: Payer: Medicare Other | Admitting: Physical Therapy

## 2020-06-20 DIAGNOSIS — M6281 Muscle weakness (generalized): Secondary | ICD-10-CM

## 2020-06-20 DIAGNOSIS — M25511 Pain in right shoulder: Secondary | ICD-10-CM | POA: Diagnosis not present

## 2020-06-20 DIAGNOSIS — R600 Localized edema: Secondary | ICD-10-CM

## 2020-06-20 DIAGNOSIS — M25611 Stiffness of right shoulder, not elsewhere classified: Secondary | ICD-10-CM | POA: Diagnosis not present

## 2020-06-20 NOTE — Therapy (Addendum)
[facility] [address] [place], [place], [address] Phone: [phone]   Fax:  [phone]  Physical Therapy Treatment  Patient Details  Name: Cory Barnett MRN: [id number] Date of Birth: [birth year] Referring Provider (PT): Tania Ade, MD  Progress Note Reporting Period 04/16/20 to 06/20/20  See note below for Objective Data and Assessment of Progress/Goals.      Encounter Date: 06/20/2020   PT End of Session - 06/20/20 0846    Visit Number 30    Date for PT Re-Evaluation 07/04/20    Authorization Type Medicare  KX    Progress Note Due on Visit 30    PT Start Time 0800    PT Stop Time 0850    PT Time Calculation (min) 50 min    Activity Tolerance Patient tolerated treatment well           Past Medical History:  Diagnosis Date  . Anxiety   . AV block, 1st degree   . Basal cell carcinoma   . Cardiac murmur   . Cataract   . Elevated PSA measurement 2018  . Hyperlipidemia   . Hypertension 2015  . 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 IFG (impaired fasting glucose) 01/2016  . Pre-diabetes   . RBBB (right bundle branch block)   . Rheumatic fever    age 74 or 12, in hospital for a month, out of school for a yr  . Seasonal allergies   . Squamous cell carcinoma     Past Surgical History:  Procedure Laterality Date  . EYE SURGERY     right eye  cataract surgery with lens implant  . NASAL SEPTUM SURGERY    . NOSE SURGERY    . 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 SHOULDER ARTHROSCOPY WITH SUBACROMIAL DECOMPRESSION Right 08/07/2019   Procedure: SHOULDER ARTHROSCOPY WITH SUBACROMIAL DECOMPRESSION;  Surgeon: Tania Ade, MD;  Location: [place];  Service: Orthopedics;  Laterality: Right;  . SKIN CANCER EXCISION     basal and squamous cell    . TONSILLECTOMY    . VASECTOMY    . WISDOM TOOTH EXTRACTION      There were no vitals filed for this visit.   Subjective Assessment - 06/20/20 0804    Subjective Shoulder feeling pretty good.  Going to [employer] and working with a Clinical research associate.  He's working me a little too hard.    Pertinent History KX at visit 15    Patient Stated Goals raise arm overhead, improve strength of Rt UE    Currently in Pain? No/denies    Pain Score 0-No pain              [facility] PT Assessment - 06/20/20 0001      Observation/Other Assessments   Focus on Therapeutic Outcomes (FOTO)  34%      AROM   Right Shoulder Flexion 132 Degrees    Right Shoulder ABduction 157 Degrees    Right Shoulder Internal Rotation --   T10   Right Shoulder External Rotation --   T2 with relative      Strength   Right Shoulder Flexion 4+/5    Right Shoulder ABduction 4/5    Right Shoulder Internal Rotation 4+/5    Right Shoulder External Rotation 4-/5  [facility] Adult PT Treatment/Exercise - 06/20/20 0001      Shoulder Exercises: Supine   Flexion AROM;Right    Flexion Limitations propped on wedge    Other Supine Exercises propped wedge 2# 20x small arcs    Other Supine Exercises 2# propped on wedge 2# ABCs      Shoulder Exercises: Sidelying   Other Sidelying Exercises 3# small arc abduction 80-100 degrees 20x    Other Sidelying Exercises 3# small arc horizontal abduction 2# 20x       Shoulder Exercises: Standing   Flexion Strengthening;Right;20 reps;Theraband    Theraband Level (Shoulder Flexion) Level 3 (Green)      Shoulder Exercises: ROM/Strengthening   UBE (Upper Arm Bike) Level 2.5 x 6 minutes (3/3)      Shoulder Exercises: Power Development worker, community 15 reps    Extension Limitations 45# single arm     Row 25 reps    Row Limitations 45# single arm      Vasopneumatic   Number Minutes Vasopneumatic  10 minutes    Vasopnuematic Location  Shoulder    Vasopneumatic  Pressure Low    Vasopneumatic Temperature  3 snow flakes                    PT Short Term Goals - 04/16/20 2011      PT SHORT TERM GOAL #1   Title independent with initial HEP    Status Achieved      PT SHORT TERM GOAL #2   Status Achieved      PT SHORT TERM GOAL #3   Title demonstrate Rt shoulder A/ROM flexion to > or = to 70 degrees without scapular elevation to improve ability to use with ADLs    Status Achieved      PT SHORT TERM GOAL #4   Title demonstrate Rt shoulder P/ROM ER to > or = to 60 degrees to improve mobility needed for self-care    Status Achieved             PT Long Term Goals - 06/20/20 1717      PT LONG TERM GOAL #1   Title The patient will be independent in safe self progression of HEP  for further improvements in ROM and strength    Time 4    Period Weeks    Status On-going      PT LONG TERM GOAL #2   Title The patient will have improved right shoulder A/ROM to 125 degrees needed for reaching eye level shelves and personal care    Status Achieved      PT LONG TERM GOAL #3   Time 4    Period Weeks    Status On-going      PT LONG TERM GOAL #4   Title demonstrate Rt shoulder ER to C5 without scapular elevation to improve use with self-care    Status Achieved      PT LONG TERM GOAL #5   Title demonstrate Rt shoulder A/ROM IR to L3 to improve independence and ease with dressing    Status Achieved      PT LONG TERM GOAL #6   Title reduce FOTO to <  or = to 33% limitation    Status Partially Met                 Plan - 06/20/20 0929    Clinical Impression Statement Good improvements in shoulder ROM in all planes particularly abduction.  Glenohumeral and  scapular strength improving as well which has helped with functional use and stability.  Pain level has remained low the past several weeks.  His FOTO score has nearly met projected long term goal 34% (goal 33%).  He should meet the remaining goals in the next 1-2 visits.     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    Examination-Activity Limitations Lift;Reach Overhead    Rehab Potential Good    PT Frequency 1x / week    PT Duration 4 weeks    PT Treatment/Interventions ADLs/Self Care Home Management;Cryotherapy;Electrical Stimulation;Moist Heat;Iontophoresis 64m/ml Dexamethasone;Functional mobility training;Therapeutic activities;Therapeutic exercise;Neuromuscular re-education;Manual techniques;Passive range of motion;Vasopneumatic Device;Dry needling    PT Next Visit Plan continue Rt UE strength; finalize HEP to prepare for discharge in 1-2 visits    PT Home Exercise Plan Access Code: [email or web address]           Patient will benefit from skilled therapeutic intervention in order to improve the following deficits and impairments:  Decreased activity tolerance, Decreased strength, Postural dysfunction, Impaired flexibility, Decreased scar mobility, Pain, Impaired UE functional use, Increased muscle spasms, Decreased endurance  Visit Diagnosis: Acute pain of right shoulder  Muscle weakness (generalized)  Stiffness of right shoulder, not elsewhere classified  Localized edema     Problem List Patient Active Problem List   Diagnosis Date Noted  . Piriformis syndrome of left side 08/18/2019  . Rotator cuff tear, right 05/29/2019  . Closed fracture of distal clavicle 05/15/2019  . 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   SRuben Im PT 06/20/20 5:22 PM Phone: [phone]: [phone] Singh8/19/2021, 5Lajuan LinesPM  [facility] [address] [place] [address]: [phone]  Fax:  [phone] Name: DADORIAN GWYNNEMRN: [id number] of Birth: [birth year]

## 2020-06-27 ENCOUNTER — Ambulatory Visit: Payer: Medicare Other

## 2020-06-27 ENCOUNTER — Other Ambulatory Visit: Payer: Self-pay

## 2020-06-27 DIAGNOSIS — M25511 Pain in right shoulder: Secondary | ICD-10-CM | POA: Diagnosis not present

## 2020-06-27 DIAGNOSIS — M6281 Muscle weakness (generalized): Secondary | ICD-10-CM

## 2020-06-27 DIAGNOSIS — M25611 Stiffness of right shoulder, not elsewhere classified: Secondary | ICD-10-CM

## 2020-06-27 DIAGNOSIS — R600 Localized edema: Secondary | ICD-10-CM

## 2020-06-27 NOTE — Therapy (Addendum)
[facility] [address], [place], [place], [address] Phone: [phone]   Fax:  [phone]  Physical Therapy Treatment  Patient Details  Name: Cory Barnett MRN: [id number] Date of Birth: 1946/06/01 Referring Provider (PT): Tania Ade, MD   Encounter Date: 06/27/2020   PT End of Session - 06/27/20 0838    Visit Number 31    Date for PT Re-Evaluation 07/04/20    Authorization Type Medicare  KX    PT Start Time 0800    PT Stop Time 0845    PT Time Calculation (min) 45 min    Activity Tolerance Patient tolerated treatment well    Behavior During Therapy [facility] for tasks assessed/performed           Past Medical History:  Diagnosis Date  . Anxiety   . AV block, 1st degree   . Basal cell carcinoma   . Cardiac murmur   . Cataract   . Elevated PSA measurement 2018  . Hyperlipidemia   . Hypertension 2015  . 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 IFG (impaired fasting glucose) 01/2016  . Pre-diabetes   . RBBB (right bundle branch block)   . Rheumatic fever    age 20 or 14, in hospital for a month, out of school for a yr  . Seasonal allergies   . Squamous cell carcinoma     Past Surgical History:  Procedure Laterality Date  . EYE SURGERY     right eye  cataract surgery with lens implant  . NASAL SEPTUM SURGERY    . NOSE SURGERY    . 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 SHOULDER ARTHROSCOPY WITH SUBACROMIAL DECOMPRESSION Right 08/07/2019   Procedure: SHOULDER ARTHROSCOPY WITH SUBACROMIAL DECOMPRESSION;  Surgeon: Tania Ade, MD;  Location: [place];  Service: Orthopedics;  Laterality: Right;  . SKIN CANCER EXCISION     basal and squamous cell   . TONSILLECTOMY    . VASECTOMY    . WISDOM TOOTH EXTRACTION      There were no vitals filed for  this visit.   Subjective Assessment - 06/27/20 0810    Subjective I'm doing well.  Working with the trainer and is working me pretty hard.    Currently in Pain? No/denies                             [facility] Adult PT Treatment/Exercise - 06/27/20 0001      Shoulder Exercises: Supine   External Rotation Strengthening;Right;20 reps;Weights    External Rotation Weight (lbs) 2#    Flexion Limitations propped on wedge    Other Supine Exercises propped wedge 2# 20x small arcs    Other Supine Exercises 2# propped on wedge 2# ABCs      Shoulder Exercises: Sidelying   Other Sidelying Exercises 3# small arc abduction 80-100 degrees 20x    Other Sidelying Exercises 3# small arc horizontal abduction 2# 20x       Shoulder Exercises: Standing   Flexion Strengthening;Right;20 reps;Theraband    Theraband Level (Shoulder Flexion) Level 3 (Green)      Shoulder Exercises: ROM/Strengthening   UBE (Upper Arm Bike) Level 2.5 x 6 minutes (3/3)      Shoulder Exercises: Power Production designer, theatre/television/film  Limitations 35# single arm    Row 25 reps    Row Limitations 35# single arm      Vasopneumatic   Number Minutes Vasopneumatic  10 minutes    Vasopnuematic Location  Shoulder    Vasopneumatic Pressure Low    Vasopneumatic Temperature  3 snow flakes                    PT Short Term Goals - 04/16/20 2011      PT SHORT TERM GOAL #1   Title independent with initial HEP    Status Achieved      PT SHORT TERM GOAL #2   Status Achieved      PT SHORT TERM GOAL #3   Title demonstrate Rt shoulder A/ROM flexion to > or = to 70 degrees without scapular elevation to improve ability to use with ADLs    Status Achieved      PT SHORT TERM GOAL #4   Title demonstrate Rt shoulder P/ROM ER to > or = to 60 degrees to improve mobility needed for self-care    Status Achieved             PT Long Term Goals - 06/20/20 1717      PT LONG TERM GOAL #1   Title The  patient will be independent in safe self progression of HEP  for further improvements in ROM and strength    Time 4    Period Weeks    Status On-going      PT LONG TERM GOAL #2   Title The patient will have improved right shoulder A/ROM to 125 degrees needed for reaching eye level shelves and personal care    Status Achieved      PT LONG TERM GOAL #3   Time 4    Period Weeks    Status On-going      PT LONG TERM GOAL #4   Title demonstrate Rt shoulder ER to C5 without scapular elevation to improve use with self-care    Status Achieved      PT LONG TERM GOAL #5   Title demonstrate Rt shoulder A/ROM IR to L3 to improve independence and ease with dressing    Status Achieved      PT LONG TERM GOAL #6   Title reduce FOTO to <  or = to 33% limitation    Status Partially Met                 Plan - 06/27/20 0824    Clinical Impression Statement Pt has demonstrated good improvements in Rt shoulder ROM in all planes over the past 2 weeks which is helping to improve functional use with self-care.  Rt shoulder and scapular strength are improving as well which has helped with functional use, endurance and stability.  Pt requires minor tactile cues for scapular depression with overhead and strength movement.  PT was able to control movement into ER with a 2# weight today.  Pain level has remained low the past several weeks.  Pt is exercising at the gym as a continuation of his HEP and he should meet the remaining goals in the next 1-2 visits.    PT Frequency 1x / week    PT Duration 4 weeks    PT Treatment/Interventions ADLs/Self Care Home Management;Cryotherapy;Electrical Stimulation;Moist Heat;Iontophoresis 15m/ml Dexamethasone;Functional mobility training;Therapeutic activities;Therapeutic exercise;Neuromuscular re-education;Manual techniques;Passive range of motion;Vasopneumatic Device;Dry needling    PT Next Visit Plan continue Rt UE strength; finalize HEP  to prepare for discharge next  visit    PT Home Exercise Plan Access Code: [email or web address]    Consulted and Agree with Plan of Care Patient           Patient will benefit from skilled therapeutic intervention in order to improve the following deficits and impairments:  Decreased activity tolerance, Decreased strength, Postural dysfunction, Impaired flexibility, Decreased scar mobility, Pain, Impaired UE functional use, Increased muscle spasms, Decreased endurance  Visit Diagnosis: Acute pain of right shoulder  Muscle weakness (generalized)  Stiffness of right shoulder, not elsewhere classified  Localized edema     Problem List Patient Active Problem List   Diagnosis Date Noted  . Piriformis syndrome of left side 08/18/2019  . Rotator cuff tear, right 05/29/2019  . Closed fracture of distal clavicle 05/15/2019  . 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    Sigurd Sos, PT 06/27/20 8:39 AM PHYSICAL THERAPY DISCHARGE SUMMARY  Visits from Start of Care: 31  Current functional level related to goals / functional outcomes: See above.  Pt will D/C to HEP and gym exercises for strength.   Remaining deficits: Limited Rt shoulder endurance s/p total shoulder replacement and this is improving with exercise.     Education / Equipment: HEP Plan: Patient agrees to discharge.  Patient goals were met. Patient is being discharged due to meeting the stated rehab goals.  ?????        Sigurd Sos, PT 07/04/20 8:26 AM  [facility] [address], [place], [place], [address] Phone: [phone]   Fax:  [phone]  Name: Cory Barnett MRN: [id number] Date of Birth: [birth year]

## 2020-07-04 ENCOUNTER — Ambulatory Visit: Payer: Medicare Other

## 2020-08-02 DIAGNOSIS — Z471 Aftercare following joint replacement surgery: Secondary | ICD-10-CM | POA: Diagnosis not present

## 2020-08-02 DIAGNOSIS — Z96611 Presence of right artificial shoulder joint: Secondary | ICD-10-CM | POA: Diagnosis not present

## 2020-08-03 DIAGNOSIS — Z23 Encounter for immunization: Secondary | ICD-10-CM | POA: Diagnosis not present

## 2020-08-05 ENCOUNTER — Other Ambulatory Visit: Payer: Self-pay

## 2020-08-05 ENCOUNTER — Encounter: Payer: Self-pay | Admitting: Family Medicine

## 2020-08-05 ENCOUNTER — Ambulatory Visit (INDEPENDENT_AMBULATORY_CARE_PROVIDER_SITE_OTHER): Payer: Medicare Other | Admitting: Family Medicine

## 2020-08-05 ENCOUNTER — Ambulatory Visit (INDEPENDENT_AMBULATORY_CARE_PROVIDER_SITE_OTHER): Payer: Medicare Other

## 2020-08-05 VITALS — BP 120/82 | HR 73 | Ht 74.0 in | Wt 198.0 lb

## 2020-08-05 DIAGNOSIS — M25562 Pain in left knee: Secondary | ICD-10-CM

## 2020-08-05 DIAGNOSIS — I2510 Atherosclerotic heart disease of native coronary artery without angina pectoris: Secondary | ICD-10-CM | POA: Diagnosis not present

## 2020-08-05 DIAGNOSIS — M545 Low back pain, unspecified: Secondary | ICD-10-CM

## 2020-08-05 DIAGNOSIS — R102 Pelvic and perineal pain: Secondary | ICD-10-CM | POA: Diagnosis not present

## 2020-08-05 DIAGNOSIS — M1712 Unilateral primary osteoarthritis, left knee: Secondary | ICD-10-CM | POA: Diagnosis not present

## 2020-08-05 DIAGNOSIS — M5416 Radiculopathy, lumbar region: Secondary | ICD-10-CM

## 2020-08-05 DIAGNOSIS — M47817 Spondylosis without myelopathy or radiculopathy, lumbosacral region: Secondary | ICD-10-CM | POA: Diagnosis not present

## 2020-08-05 DIAGNOSIS — G8929 Other chronic pain: Secondary | ICD-10-CM | POA: Diagnosis not present

## 2020-08-05 MED ORDER — GABAPENTIN 100 MG PO CAPS: 200.0000 mg | ORAL_CAPSULE | Freq: Every day | ORAL | 0 refills | Status: DC

## 2020-08-05 MED ORDER — MONTELUKAST SODIUM 10 MG PO TABS: 10.0000 mg | ORAL_TABLET | Freq: Every day | ORAL | 3 refills | Status: DC

## 2020-08-05 NOTE — Progress Notes (Signed)
[facility] [place] Phone: [phone] Subjective:   Cory Barnett, am serving as a scribe for Dr. Hulan [name]. This visit occurred during the SARS-CoV-2 public health emergency.  Safety protocols were in place, including screening questions prior to the visit, additional usage of staff PPE, and extensive cleaning of exam room while observing appropriate contact time as indicated for disinfecting solutions.   I'm seeing this patient by the request  of:  Crist Infante, MD  CC: Leg pain  PXT:[id number]   10/04/2019 Improved with conservative therapy.  I discussed continuing the physical therapy as needed.  Discussed as long as patient is well Barnett change management at this time.   Update 08/05/2020 Cory Barnett is a 73 y.o. male coming in with complaint of left hip pain over the greater trochanter. Most painful when he is sleeping and climbing stairs. States that he has caught his foot on the floor at times and that his left calf is smaller than the right.  Patient does state that this has been going on since he did have a surgery on his shoulder.  Patient has had difficulty with inflammation and has has had an elevated CRP.  Patient states that it has been coming down but still somewhat elevated.  Has had difficulty with some mild weight loss after a gastrointestinal pathology and is following up with GI in the near future.      Past Medical History:  Diagnosis Date  . Anxiety   . AV block, 1st degree   . Basal cell carcinoma   . Cardiac murmur   . Cataract   . Elevated PSA measurement 2018  . Hyperlipidemia   . Hypertension 2015  . 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 IFG (impaired fasting glucose) 01/2016  . Pre-diabetes   . RBBB (right bundle branch block)   . Rheumatic fever    age 59 or 67, in hospital for a month, out of school for a yr  . Seasonal allergies   . Squamous cell carcinoma    Past Surgical History:    Procedure Laterality Date  . EYE SURGERY     right eye  cataract surgery with lens implant  . NASAL SEPTUM SURGERY    . NOSE SURGERY    . 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 SHOULDER ARTHROSCOPY WITH SUBACROMIAL DECOMPRESSION Right 08/07/2019   Procedure: SHOULDER ARTHROSCOPY WITH SUBACROMIAL DECOMPRESSION;  Surgeon: Tania Ade, MD;  Location: [facility];  Service: Orthopedics;  Laterality: Right;  . SKIN CANCER EXCISION     basal and squamous cell   . TONSILLECTOMY    . VASECTOMY    . WISDOM TOOTH EXTRACTION     Social History   Socioeconomic History  . Marital status: Married    Spouse name: Not on file  . Number of children: 3  . Years of education: Not on file  . Highest education level: Not on file  Occupational History    Comment: Retired  Tobacco Use  . Smoking status: Former Smoker    Types: Cigarettes    Quit date: 12/14/1969    Years since quitting: 50.6  . Smokeless tobacco: Never Used  Vaping Use  . Vaping Use: Never used  Substance and Sexual Activity  . Alcohol use: Yes    Alcohol/week: 2.0  standard drinks    Types: 2 Cans of beer per week    Comment: social  . Drug use: Never  . Sexual activity: Not on file  Other Topics Concern  . Not on file  Social History Narrative  . Not on file   Social Determinants of Health   Financial Resource Strain:   . Difficulty of Paying Living Expenses: Not on file  Food Insecurity:   . Worried About Charity fundraiser in the Last Year: Not on file  . Ran Out of Food in the Last Year: Not on file  Transportation Needs:   . Lack of Transportation (Medical): Not on file  . Lack of Transportation (Non-Medical): Not on file  Physical Activity:   . Days of Exercise per Week: Not on file  . Minutes of Exercise per Session: Not on file  Stress:   .  Feeling of Stress : Not on file  Social Connections:   . Frequency of Communication with Friends and Family: Not on file  . Frequency of Social Gatherings with Friends and Family: Not on file  . Attends Religious Services: Not on file  . Active Member of Clubs or Organizations: Not on file  . Attends Archivist Meetings: Not on file  . Marital Status: Not on file   Allergies  Allergen Reactions  . Bee Venom Anaphylaxis  . Moxifloxacin Shortness Of Breath  . Shellfish Allergy Itching and Rash   Family History  Problem Relation Age of Onset  . Stroke Mother   . Arthritis Mother   . Cancer Mother        unknown   . Dementia Mother   . Arthritis Father   . Mesothelioma Father   . Arthritis Sister   . Arthritis Sister   . Bipolar disorder Sister      Current Outpatient Medications (Cardiovascular):  .  losartan (COZAAR) 100 MG tablet, TAKE 1 TABLET BY MOUTH EVERY DAY .  tadalafil (CIALIS) 10 MG tablet, Take 1 tablet (10 mg total) by mouth daily as needed for erectile dysfunction. .  metoprolol tartrate (LOPRESSOR) 100 MG tablet, Take 1 tablet (100 mg total) by mouth once for 1 dose. Take 2 hours prior to test (Patient not taking: Reported on 01/04/2020) .  rosuvastatin (CRESTOR) 10 MG tablet, Take 1 tablet (10 mg total) by mouth daily.  Current Outpatient Medications (Respiratory):  .  montelukast (SINGULAIR) 10 MG tablet, Take 1 tablet (10 mg total) by mouth at bedtime.  Current Outpatient Medications (Analgesics):  .  aspirin EC 81 MG tablet, Take 81 mg by mouth daily. Marland Kitchen  oxyCODONE-acetaminophen (PERCOCET) 5-325 MG tablet, Take 1-2 tablets every 4 hours as needed for post operative pain. MAX 6/day   Current Outpatient Medications (Other):  Marland Kitchen  CHELATED MAGNESIUM PO, Take 2 tablets by mouth in the morning and at bedtime.  .  Coenzyme Q10 (CO Q 10) 100 MG CAPS, Take 100 mg by mouth in the morning and at bedtime.  Marland Kitchen  escitalopram (LEXAPRO) 10 MG tablet, Take 10 mg by  mouth daily.  .  Multiple Vitamin (MULTI-VITAMINS) TABS, Take 3 tablets by mouth in the morning and at bedtime.  Marland Kitchen  OVER THE COUNTER MEDICATION, Take 2 tablets by mouth in the morning and at bedtime. Biotics Biomega 1000 (total body support) .  Probiotic Product (PROBIOTIC PO), Take 1 capsule by mouth daily. .  Saw Palmetto, Serenoa repens, (SAW PALMETTO PO), Take 2 capsules by mouth in the morning and  at bedtime. Marland Kitchen  tiZANidine (ZANAFLEX) 4 MG tablet, Take 1 tablet (4 mg total) by mouth every 8 (eight) hours as needed for muscle spasms. Marland Kitchen  gabapentin (NEURONTIN) 100 MG capsule, Take 2 capsules (200 mg total) by mouth at bedtime.   Reviewed prior external information including notes and imaging from  primary care provider As well as notes that were available from care everywhere and other healthcare systems.  Past medical history, social, surgical and family history all reviewed in electronic medical record.  Barnett pertanent information unless stated regarding to the chief complaint.   Review of Systems:  Barnett headache, visual changes, nausea, vomiting, diarrhea, constipation, dizziness, abdominal pain, skin rash, fevers, chills, night sweats, weight loss, swollen lymph nodes, body aches, joint swelling, chest pain, shortness of breath, mood changes. POSITIVE muscle aches  Objective  Blood pressure 120/82, pulse 73, height 6\' 2"  (1.88 m), weight 198 lb (89.8 kg), SpO2 95 %.   General: Barnett apparent distress alert and oriented x3 mood and affect normal, dressed appropriately.  HEENT: Pupils equal, extraocular movements intact  Respiratory: Patient's speak in full sentences and does not appear short of breath  Cardiovascular: Barnett lower extremity edema, non tender, Barnett erythema  Neuro: Cranial nerves II through XII are intact, neurovascularly intact in all extremities with 2+ DTRs and 2+ pulses.  Gait normal with good balance and coordination.  MSK: Arthritic changes of multiple joints.  Very mild  weakness with hip flexion on the left compared to the right.  Mild atrophy of the medial gastroc head compared to the contralateral side.  Good strength though at the ankle noted and neurovascularly intact.  Patient has some tightness with straight leg test and tightness with Corky Sox on the left greater than right as well. Left knee exam does show some mild arthritic changes and is tender over the tibial tuberosity.   Impression and Recommendations:     The above documentation has been reviewed and is accurate and complete Lyndal Pulley, DO

## 2020-08-05 NOTE — Patient Instructions (Signed)
Gabapentin 100mg  for 1 week and then 200mg  there after Xray today Singulair daily Exercises 3x a week See me again in 5 weeks

## 2020-08-05 NOTE — Assessment & Plan Note (Signed)
Overall mild, will consider possibly ultrasound at their follow-up, x-rays pending but do not think it would change management today

## 2020-08-05 NOTE — Assessment & Plan Note (Signed)
I believe the patient has more of a secondary to chronic lumbar radiculopathy.  Patient has noted some mild atrophy of the left calf is noted.  Patient's pain seems to be more on the left side and does feel like he has tightness.  X-rays of the lumbar spine ordered today, low dose of gabapentin as well as given Singulair secondary to the elevation of his CRP.  We will see if that makes any difference with some mild improvement with some replacement.  Patient warned of potential side effects.  Will call us if he has any of those.  Patient was having some GI distress earlier and has been referred to gastroenterology which I think is a good idea with weight loss.  If patient comes back we will need to consider the possibility of other laboratory work-up if this continues.  Follow-up again in 4 to 6 weeks

## 2020-08-13 ENCOUNTER — Ambulatory Visit: Payer: Medicare Other

## 2020-08-19 ENCOUNTER — Encounter: Payer: Self-pay | Admitting: Family Medicine

## 2020-08-24 DIAGNOSIS — Z23 Encounter for immunization: Secondary | ICD-10-CM | POA: Diagnosis not present

## 2020-09-10 ENCOUNTER — Encounter: Payer: Self-pay | Admitting: Gastroenterology

## 2020-09-10 ENCOUNTER — Ambulatory Visit (INDEPENDENT_AMBULATORY_CARE_PROVIDER_SITE_OTHER): Payer: Medicare Other | Admitting: Gastroenterology

## 2020-09-10 VITALS — BP 130/80 | HR 64 | Ht 74.0 in | Wt 203.1 lb

## 2020-09-10 DIAGNOSIS — I2510 Atherosclerotic heart disease of native coronary artery without angina pectoris: Secondary | ICD-10-CM | POA: Diagnosis not present

## 2020-09-10 DIAGNOSIS — K529 Noninfective gastroenteritis and colitis, unspecified: Secondary | ICD-10-CM | POA: Diagnosis not present

## 2020-09-10 DIAGNOSIS — R1032 Left lower quadrant pain: Secondary | ICD-10-CM

## 2020-09-10 NOTE — Progress Notes (Signed)
[facility] Progress Note  Chief Complaint: Abdominal pain, question diverticulitis  Subjective  History: Seen mid 2019 for years of chronic left lower quadrant pain and constipation. Colonoscopy 05/10/2018, fair preparation, complete exam to cecum, left-sided diverticulosis and tortuosity.  Recommended adding low-dose MiraLAX the patient stool softener regimen.  Primary care notes indicate episode of left lower quadrant pain and loose stool in late August, treated with 10 days of metronidazole. Patient reports an episode in June that was more severe and longer lasting and led to a 20 pound weight loss. _________________   Cory Barnett has had some ongoing digestive issues.  Since I last saw him, he will often have a dull LLQ discomfort for a few minutes after BMs.  After working with an integrative medicine provider, he has discovered many dietary changes that have improved his abdominal pain and bowel habit irregularity.  As long as he sticks close to that and eats food that his wife prepares, he feels generally quite well.  He became acutely sick in June after they went to a cookout, he had sudden onset nausea vomiting abdominal pain and diarrhea that lasted for a few days, but then the diarrhea took longer to resolve over perhaps 10 days or so.  He lost about 20 pounds in that time, call primary care but it was already getting better so no specific testing or treatment.  Late August he had another episode less severe, they were out of town and ate foods that he normally does not. Last week they went to a funeral and a similar thing happened to a lesser degree. Cory Barnett does not have black or bloody stool, and overall feels well. He and his primary care provider wondered if he should have another colonoscopy, thinking that perhaps these of been episodes of diverticulitis. Shoulder replacement March of this year, no antibiotics prior to symptom onset.  ROS: Cardiovascular:  no chest  pain Respiratory: no dyspnea Remainder of systems negative except as above  The patient's Past Medical, Family and Social History were reviewed and are on file in the EMR. 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   IFG (impaired fasting glucose) 01/2016   Pre-diabetes    RBBB (right bundle branch block)    Rheumatic fever    age 66 or 69, in hospital for a month, out of school for a yr   Seasonal allergies    Squamous cell carcinoma    Testicular hypofunction    Past Surgical History:  Procedure Laterality Date   EYE SURGERY     right eye  cataract surgery with lens implant   NASAL SEPTUM SURGERY     NOSE SURGERY     REVERSE SHOULDER ARTHROPLASTY Right 01/11/2020   Procedure: REVERSE SHOULDER ARTHROPLASTY;  Surgeon: Tania Ade, MD;  Location: [facility];  Service: Orthopedics;  Laterality: Right;   SHOULDER ARTHROSCOPY WITH SUBACROMIAL DECOMPRESSION Right 08/07/2019   Procedure: SHOULDER ARTHROSCOPY WITH SUBACROMIAL DECOMPRESSION;  Surgeon: Tania Ade, MD;  Location: [facility];  Service: Orthopedics;  Laterality: Right;   SKIN CANCER EXCISION     basal and squamous cell    TONSILLECTOMY     VASECTOMY  WISDOM TOOTH EXTRACTION      Objective:  Med list reviewed  Current Outpatient Medications:    Barberry-Oreg Grape-Goldenseal (BERBERINE COMPLEX PO), Take by mouth., Disp: , Rfl:    CHELATED MAGNESIUM PO, Take 2 tablets by mouth in the morning and at bedtime. , Disp: , Rfl:    Cholecalciferol (VITAMIN D3) 1.25 MG (50000 UT) CAPS, Take by mouth., Disp: , Rfl:    Coenzyme Q10 (CO Q 10) 100 MG CAPS, Take 100 mg by mouth in the morning and at bedtime. , Disp: , Rfl:    escitalopram (LEXAPRO) 10 MG tablet, Take 10 mg  by mouth daily. , Disp: , Rfl:    gabapentin (NEURONTIN) 100 MG capsule, Take 2 capsules (200 mg total) by mouth at bedtime., Disp: 180 capsule, Rfl: 0   losartan (COZAAR) 100 MG tablet, TAKE 1 TABLET BY MOUTH EVERY DAY (Patient taking differently: 50 mg. ), Disp: 90 tablet, Rfl: 3   Multiple Vitamin (MULTI-VITAMINS) TABS, Take 3 tablets by mouth in the morning and at bedtime. , Disp: , Rfl:    OREGANO PO, Take by mouth., Disp: , Rfl:    OVER THE COUNTER MEDICATION, Take 2 tablets by mouth in the morning and at bedtime. Biotics Biomega 1000 (total body support), Disp: , Rfl:    Probiotic Product (PROBIOTIC PO), Take 1 capsule by mouth daily., Disp: , Rfl:    Saw Palmetto, Serenoa repens, (SAW PALMETTO PO), Take 2 capsules by mouth in the morning and at bedtime., Disp: , Rfl:    rosuvastatin (CRESTOR) 10 MG tablet, Take 1 tablet (10 mg total) by mouth daily., Disp: 90 tablet, Rfl: 3   Vital signs in last 24 hrs: Vitals:   09/10/20 1042  BP: 130/80  Pulse: 64    Physical Exam  He is well-appearing and his wife is present for the entire visit.  HEENT: sclera anicteric, oral mucosa moist without lesions  Neck: supple, no thyromegaly, JVD or lymphadenopathy  Cardiac: RRR without murmurs, S1S2 heard, no peripheral edema  Pulm: clear to auscultation bilaterally, normal RR and effort noted  Abdomen: soft, no tenderness, with active bowel sounds. No guarding or palpable hepatosplenomegaly.  Skin; warm and dry, no jaundice or rash  Labs:  Patient has had routine FOBT testing with primary care, negative Hemosure on 05-03-20  Last CMP normal 04/24/2020  Hemoglobin 12.9 on 04/24/2020  ___________________________________________ Radiologic studies:   ____________________________________________ Other:   _____________________________________________ Assessment & Plan  Assessment: Encounter Diagnoses  Name Primary?   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 Overall he looks and feels well and says he has minimal abdominal pain and his bowel habits are regular if he sticks to his dietary plan.  These do not sound like diverticulitis episodes, I do not think he needs a colonoscopy at this point. He is also up-to-date on colorectal cancer screening and at this age, does not need to have annual routine fecal occult blood testing.  Plan: He will call me if symptoms recur, at which point we would try at low-dose as needed dicyclomine.  I offered a prescription to have some of that on hand in case he needs it, but he would prefer to call if needed.   30 minutes were spent on this encounter (including  chart review, history/exam, counseling/coordination of care, and documentation)  Nelida Meuse III

## 2020-09-10 NOTE — Patient Instructions (Signed)
If you are age 75 or older, your body mass index should be between 23-30. Your Body mass index is 26.08 kg/m. If this is out of the aforementioned range listed, please consider follow up with your Primary Care Provider.  If you are age 42 or younger, your body mass index should be between 19-25. Your Body mass index is 26.08 kg/m. If this is out of the aformentioned range listed, please consider follow up with your Primary Care Provider.   Follow up as needed. [phone]  It was a pleasure to see you today!  Dr. Loletha Carrow

## 2020-09-11 NOTE — Progress Notes (Signed)
[name] [place] Phone: [phone] Subjective:   Rito Ehrlich, am serving as a scribe for Dr. Hulan [name]. This visit occurred during the SARS-CoV-2 public health emergency.  Safety protocols were in place, including screening questions prior to the visit, additional usage of staff PPE, and extensive cleaning of exam room while observing appropriate contact time as indicated for disinfecting solutions.   I'm seeing this patient by the request  of:  Crist Infante, MD  CC: Back pain and knee pain follow-up  [id number]   08/05/2020 Overall mild, will consider possibly ultrasound at their follow-up, x-rays pending but do not think it would change management today  I believe the patient has more of a secondary to chronic lumbar radiculopathy.  Patient has noted some mild atrophy of the left calf is noted.  Patient's pain seems to be more on the left side and does feel like he has tightness.  X-rays of the lumbar spine ordered today, low dose of gabapentin as well as given Singulair secondary to the elevation of his CRP.  We will see if that makes any difference with some mild improvement with some replacement.  Patient warned of potential side effects.  Will call us if he has any of those.  Patient was having some GI distress earlier and has been referred to gastroenterology which I think is a good idea with weight loss.  If patient comes back we will need to consider the possibility of other laboratory work-up if this continues.  Follow-up again in 4 to 6 weeks  Update 09/12/2020 TAUNO FALOTICO is a 74 y.o. male coming in with complaint of left knee and low back pain. Patient states L knee is doing pretty good pain is quite a bit less with taking the gabapentin. States the medication is helping both areas. Patient is wanting to review the x-ray results .  Patient did have x-rays of the lumbar spine showing the patient did have  some moderate degenerative disc disease.  Patient also had x-rays of the left knee that showed very minimal arthritic changes but did have a posterior lateral loose body noted.  Patient does not have any locking of the knee.  Feels like he is feeling 60 to 65% better.  Patient was unable to start all the exercises secondary to the death of his brother-in-law.  Has been taking the gabapentin regularly and has been sleeping much better throughout the night also notices may be some more endurance throughout the day       Past Medical History:  Diagnosis Date  . Anxiety   . AV block, 1st degree   . Basal cell carcinoma   . Cardiac murmur   . Cataract   . Diverticulosis   . Elevated PSA measurement 2018  . Hyperlipidemia   . Hypertension 2015  . 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 IFG (impaired fasting glucose) 01/2016  . Pre-diabetes   . RBBB (right bundle branch block)   . Rheumatic fever    age 31 or 69, in hospital for a month, out of school for a yr  . Seasonal allergies   . Squamous cell carcinoma   . Testicular hypofunction    Past Surgical History:  Procedure Laterality Date  . EYE SURGERY     right eye  cataract surgery with lens implant  . NASAL SEPTUM SURGERY    . NOSE SURGERY    . REVERSE SHOULDER ARTHROPLASTY  Right 01/11/2020   Procedure: REVERSE SHOULDER ARTHROPLASTY;  Surgeon: Tania Ade, MD;  Location: [facility];  Service: Orthopedics;  Laterality: Right;  . SHOULDER ARTHROSCOPY WITH SUBACROMIAL DECOMPRESSION Right 08/07/2019   Procedure: SHOULDER ARTHROSCOPY WITH SUBACROMIAL DECOMPRESSION;  Surgeon: Tania Ade, MD;  Location: [facility];  Service: Orthopedics;  Laterality: Right;  . SKIN CANCER EXCISION     basal and squamous cell   . TONSILLECTOMY    . VASECTOMY    . WISDOM TOOTH EXTRACTION     Social History   Socioeconomic History  . Marital status: Married    Spouse name: Not on file  . Number of children: 3  . Years of education: Not  on file  . Highest education level: Not on file  Occupational History    Comment: Retired  Tobacco Use  . Smoking status: Former Smoker    Types: Cigarettes    Quit date: 12/14/1969    Years since quitting: 50.7  . Smokeless tobacco: Never Used  Vaping Use  . Vaping Use: Never used  Substance and Sexual Activity  . Alcohol use: Yes    Alcohol/week: 2.0 standard drinks    Types: 2 Cans of beer per week    Comment: social  . Drug use: Never  . Sexual activity: Not on file  Other Topics Concern  . Not on file  Social History Narrative  . Not on file   Social Determinants of Health   Financial Resource Strain:   . Difficulty of Paying Living Expenses: Not on file  Food Insecurity:   . Worried About Charity fundraiser in the Last Year: Not on file  . Ran Out of Food in the Last Year: Not on file  Transportation Needs:   . Lack of Transportation (Medical): Not on file  . Lack of Transportation (Non-Medical): Not on file  Physical Activity:   . Days of Exercise per Week: Not on file  . Minutes of Exercise per Session: Not on file  Stress:   . Feeling of Stress : Not on file  Social Connections:   . Frequency of Communication with Friends and Family: Not on file  . Frequency of Social Gatherings with Friends and Family: Not on file  . Attends Religious Services: Not on file  . Active Member of Clubs or Organizations: Not on file  . Attends Archivist Meetings: Not on file  . Marital Status: Not on file   Allergies  Allergen Reactions  . Bee Venom Anaphylaxis  . Moxifloxacin Shortness Of Breath  . Shellfish Allergy Itching and Rash   Family History  Problem Relation Age of Onset  . Stroke Mother   . Arthritis Mother   . Cancer Mother        unknown   . Dementia Mother   . Arthritis Father   . Mesothelioma Father   . Arthritis Sister   . Arthritis Sister   . Bipolar disorder Sister      Current Outpatient Medications (Cardiovascular):  .  losartan  (COZAAR) 100 MG tablet, TAKE 1 TABLET BY MOUTH EVERY DAY (Patient taking differently: 50 mg. ) .  rosuvastatin (CRESTOR) 10 MG tablet, Take 1 tablet (10 mg total) by mouth daily.     Current Outpatient Medications (Other):  Marland Kitchen  Barberry-Oreg Grape-Goldenseal (BERBERINE COMPLEX PO), Take by mouth. .  CHELATED MAGNESIUM PO, Take 2 tablets by mouth in the morning and at bedtime.  .  Cholecalciferol (VITAMIN D3) 1.25 MG (50000 UT)  CAPS, Take by mouth. .  Coenzyme Q10 (CO Q 10) 100 MG CAPS, Take 100 mg by mouth in the morning and at bedtime.  Marland Kitchen  escitalopram (LEXAPRO) 10 MG tablet, Take 10 mg by mouth daily.  Marland Kitchen  gabapentin (NEURONTIN) 100 MG capsule, Take 2 capsules (200 mg total) by mouth at bedtime. .  Multiple Vitamin (MULTI-VITAMINS) TABS, Take 3 tablets by mouth in the morning and at bedtime.  .  OREGANO PO, Take by mouth. Marland Kitchen  OVER THE COUNTER MEDICATION, Take 2 tablets by mouth in the morning and at bedtime. Biotics Biomega 1000 (total body support) .  Probiotic Product (PROBIOTIC PO), Take 1 capsule by mouth daily. .  Saw Palmetto, Serenoa repens, (SAW PALMETTO PO), Take 2 capsules by mouth in the morning and at bedtime.   Reviewed prior external information including notes and imaging from  primary care provider As well as notes that were available from care everywhere and other healthcare systems.  Past medical history, social, surgical and family history all reviewed in electronic medical record.  No pertanent information unless stated regarding to the chief complaint.   Review of Systems:  No headache, visual changes, nausea, vomiting, diarrhea, constipation, dizziness, abdominal pain, skin rash, fevers, chills, night sweats, weight loss, swollen lymph nodes, body aches, joint swelling, chest pain, shortness of breath, mood changes. POSITIVE muscle aches  Objective  Blood pressure 112/72, pulse 65, height 6\' 2"  (1.88 m), weight 202 lb (91.6 kg), SpO2 98 %.   General: No apparent  distress alert and oriented x3 mood and affect normal, dressed appropriately.  HEENT: Pupils equal, extraocular movements intact  Respiratory: Patient's speak in full sentences and does not appear short of breath  Cardiovascular: No lower extremity edema, non tender, no erythema  Arthritic changes of multiple joints Back exam Minor tenderness to palpation to the paraspinal musculature on the left side of the spine.  Negative straight leg test.  Still does have some mild atrophy of the left leg compared to the contralateral side.  Patient does have good strength.  Patient's left knee near full range of motion.  Very minimal crepitus.  No instability with valgus or varus force    Impression and Recommendations:     The above documentation has been reviewed and is accurate and complete Lyndal Pulley, DO

## 2020-09-12 ENCOUNTER — Other Ambulatory Visit: Payer: Self-pay

## 2020-09-12 ENCOUNTER — Ambulatory Visit (INDEPENDENT_AMBULATORY_CARE_PROVIDER_SITE_OTHER): Payer: Medicare Other | Admitting: Family Medicine

## 2020-09-12 ENCOUNTER — Encounter: Payer: Self-pay | Admitting: Family Medicine

## 2020-09-12 DIAGNOSIS — M25562 Pain in left knee: Secondary | ICD-10-CM | POA: Diagnosis not present

## 2020-09-12 DIAGNOSIS — G8929 Other chronic pain: Secondary | ICD-10-CM | POA: Diagnosis not present

## 2020-09-12 DIAGNOSIS — M5416 Radiculopathy, lumbar region: Secondary | ICD-10-CM | POA: Diagnosis not present

## 2020-09-12 DIAGNOSIS — I2510 Atherosclerotic heart disease of native coronary artery without angina pectoris: Secondary | ICD-10-CM

## 2020-09-12 NOTE — Assessment & Plan Note (Signed)
Patient has very mild arthritis.  Very small loose body noted in the posterior lateral compartment of the knee.  Discussed with patient about different treatment options but patient is having no locking or giving out on him.  Seems to be doing well.  No swelling noted.  We discussed if any internal derangement signs then we will need to consider advanced imaging or other treatment options.  Patient would like to continue with conservative therapy at this time

## 2020-09-12 NOTE — Patient Instructions (Signed)
Great to see you  Continue the gabapentin and can go up to 200mg  at night if needed Keep up with the exercises You can try the heel lift in the right shoe but if back pain worsen get it out immediately.  The knee as long as it does not lock on you I think you will do well  See me again in 2-3 months

## 2020-09-12 NOTE — Assessment & Plan Note (Signed)
Patient is making some progress.  Gabapentin is helping.  Only taking 100 mg at this time and we encouraged him that he could increase it to 200 mg.  Patient will continue to stay active.  Patient would not want any further imaging because it would not change medical management.  Discussed with patient about icing regimen and home exercises.  Follow-up with me again in 3 months

## 2020-10-01 DIAGNOSIS — H2512 Age-related nuclear cataract, left eye: Secondary | ICD-10-CM | POA: Diagnosis not present

## 2020-10-01 DIAGNOSIS — Z961 Presence of intraocular lens: Secondary | ICD-10-CM | POA: Diagnosis not present

## 2020-10-01 DIAGNOSIS — H18513 Endothelial corneal dystrophy, bilateral: Secondary | ICD-10-CM | POA: Diagnosis not present

## 2020-10-31 ENCOUNTER — Other Ambulatory Visit: Payer: Self-pay | Admitting: Family Medicine

## 2020-10-31 NOTE — Telephone Encounter (Signed)
Left message for patient to call back to see if he is using singulair.

## 2020-11-13 DIAGNOSIS — Z85828 Personal history of other malignant neoplasm of skin: Secondary | ICD-10-CM | POA: Diagnosis not present

## 2020-11-13 DIAGNOSIS — L570 Actinic keratosis: Secondary | ICD-10-CM | POA: Diagnosis not present

## 2020-11-13 DIAGNOSIS — C4442 Squamous cell carcinoma of skin of scalp and neck: Secondary | ICD-10-CM | POA: Diagnosis not present

## 2020-11-13 DIAGNOSIS — L578 Other skin changes due to chronic exposure to nonionizing radiation: Secondary | ICD-10-CM | POA: Diagnosis not present

## 2020-11-13 DIAGNOSIS — L814 Other melanin hyperpigmentation: Secondary | ICD-10-CM | POA: Diagnosis not present

## 2020-11-13 DIAGNOSIS — D225 Melanocytic nevi of trunk: Secondary | ICD-10-CM | POA: Diagnosis not present

## 2020-11-13 DIAGNOSIS — L821 Other seborrheic keratosis: Secondary | ICD-10-CM | POA: Diagnosis not present

## 2020-12-05 DIAGNOSIS — H18511 Endothelial corneal dystrophy, right eye: Secondary | ICD-10-CM | POA: Diagnosis not present

## 2020-12-05 DIAGNOSIS — Z881 Allergy status to other antibiotic agents status: Secondary | ICD-10-CM | POA: Diagnosis not present

## 2020-12-05 DIAGNOSIS — F419 Anxiety disorder, unspecified: Secondary | ICD-10-CM | POA: Diagnosis not present

## 2020-12-05 DIAGNOSIS — Z87891 Personal history of nicotine dependence: Secondary | ICD-10-CM | POA: Diagnosis not present

## 2020-12-05 DIAGNOSIS — I10 Essential (primary) hypertension: Secondary | ICD-10-CM | POA: Diagnosis not present

## 2020-12-05 DIAGNOSIS — Z91030 Bee allergy status: Secondary | ICD-10-CM | POA: Diagnosis not present

## 2020-12-06 DIAGNOSIS — Z961 Presence of intraocular lens: Secondary | ICD-10-CM | POA: Diagnosis not present

## 2020-12-06 DIAGNOSIS — H2512 Age-related nuclear cataract, left eye: Secondary | ICD-10-CM | POA: Diagnosis not present

## 2020-12-06 DIAGNOSIS — Z48810 Encounter for surgical aftercare following surgery on the sense organs: Secondary | ICD-10-CM | POA: Diagnosis not present

## 2020-12-06 DIAGNOSIS — H18513 Endothelial corneal dystrophy, bilateral: Secondary | ICD-10-CM | POA: Diagnosis not present

## 2020-12-07 NOTE — Progress Notes (Unsigned)
Virtual Visit via Video Note   This visit type was conducted due to national recommendations for restrictions regarding the COVID-19 Pandemic (e.g. social distancing) in an effort to limit this patient's exposure and mitigate transmission in our community.  Due to his co-morbid illnesses, this patient is at least at moderate risk for complications without adequate follow up.  This format is felt to be most appropriate for this patient at this time.  All issues noted in this document were discussed and addressed.  A limited physical exam was performed with this format.  Please refer to the patient's chart for his consent to telehealth for [facility].    Virtual platform was offered given ongoing worsening Covid-19 pandemic.  Date:  12/10/2020   ID:  Cory Barnett, DOB 07-22-1946, MRN [id number]  Patient Location: Home Provider Location: [facility]  PCP:  Crist Infante, MD  Cardiologist:  Kirk Ruths, MD  Electrophysiologist:  None   Evaluation Performed:  Follow-Up Visit  Chief Complaint: follow-up of chest pain with mild CAD  History of Present Illness:    Cory Barnett is a 75 y.o. 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 Stanford Breed and presents today for follow-up. Cory Barnett is the father of one of our Cardiologist, Dr. Sherren Mocha.  At office visit in 10/2019, patient report fatigue and some chest pressure. Echo and coronary CTA were ordered for further evaluation. Coronary CTA showed coronary calcium score of 148 (42 percentile for age and sex) and mild non-obstructive CAD (25-49%) in the proximal LAD. Echo showed LVEF of 60-65% with normal wall motion, mild AS, trivial AI, trivial MR, mild TR, and mild PR as well as mildly elevated PASP. Patient was last seen by Dr.  Stanford Breed in 11/2019 at which time he reported occasional chest heaviness not related to activities but no exertional pain. He was advised to follow-up in 12 months.  Patient presents today for follow-up via video visit. Here with his wife. He had right eye surgery last week which was unfortunately unsuccessful so he is scheduled to have repeat surgery later this week. He is doing well from a cardiac standpoint. However, he is doing well from a cardiac standpoint. No chest pain or shortness of breath. He reports trouble breathing on his back which has been an ongoing problem over the last several years. It sounds more like sleep apnea as wife describes episodes of apnea. No PND or edema. He has had no recent problems with palpitations. No lightheadedness, dizziness, or syncope.  Past Medical History:  Diagnosis Date  . Anxiety   . AV block, 1st degree   . Basal cell carcinoma   . Cardiac murmur   . Cataract   . Diverticulosis   . Elevated PSA measurement 2018  . Hyperlipidemia   . Hypertension 2015  . 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 IFG (impaired fasting glucose) 01/2016  . Pre-diabetes   . RBBB (right bundle branch block)   . Rheumatic fever    age 21 or 89, in hospital for a month, out of school for a yr  . Seasonal allergies   . Squamous cell carcinoma   . Testicular hypofunction    Past Surgical History:  Procedure Laterality Date  . EYE SURGERY     right eye  cataract  surgery with lens implant  . NASAL SEPTUM SURGERY    . NOSE SURGERY    . 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 SHOULDER ARTHROSCOPY WITH SUBACROMIAL DECOMPRESSION Right 08/07/2019   Procedure: SHOULDER ARTHROSCOPY WITH SUBACROMIAL DECOMPRESSION;  Surgeon: Tania Ade, MD;  Location: [facility];  Service: Orthopedics;  Laterality: Right;  . SKIN CANCER  EXCISION     basal and squamous cell   . TONSILLECTOMY    . VASECTOMY    . WISDOM TOOTH EXTRACTION       Current Meds  Medication Sig  . Barberry-Oreg Grape-Goldenseal (BERBERINE COMPLEX PO) Take by mouth.  . CHELATED MAGNESIUM PO Take 2 tablets by mouth in the morning and at bedtime.   . Cholecalciferol (VITAMIN D3) 1.25 MG (50000 UT) CAPS Take by mouth.  . Coenzyme Q10 (CO Q 10) 100 MG CAPS Take 100 mg by mouth in the morning and at bedtime.   Marland Kitchen escitalopram (LEXAPRO) 10 MG tablet Take 10 mg by mouth daily.   . Investigational omega-3-fatty acid/placebo capsule S0927 Take 4 capsules by mouth 2 (two) times daily. Take with food.  Marland Kitchen losartan (COZAAR) 50 MG tablet Take by mouth.  . Multiple Vitamin (MULTI-VITAMINS) TABS Take 3 tablets by mouth in the morning and at bedtime.   . Probiotic Product (PROBIOTIC PO) Take 1 capsule by mouth daily.  . Saw Palmetto, Serenoa repens, (SAW PALMETTO PO) Take 2 capsules by mouth in the morning and at bedtime.     Allergies:   Bee venom and Moxifloxacin   Social History   Tobacco Use  . Smoking status: Former Smoker    Types: Cigarettes    Quit date: 12/14/1969    Years since quitting: 51.0  . Smokeless tobacco: Never Used  Vaping Use  . Vaping Use: Never used  Substance Use Topics  . Alcohol use: Yes    Alcohol/week: 2.0 standard drinks    Types: 2 Cans of beer per week    Comment: social  . Drug use: Never     Family Hx: The patient's family history includes Arthritis in his father, mother, sister, and sister; Bipolar disorder in his sister; Cancer in his mother; Dementia in his mother; Mesothelioma in his father; Stroke in his mother.  ROS:   Please see the history of present illness.      Prior CV studies:    The following studies were reviewed today:   Coronary CTA 11/07/2019: Impression: 1. Coronary calcium score of 148. This was 19 percentile for age and sex matched control. 2. Normal coronary origin with right  dominance. 3. CAD-RADS 2. Mild non-obstructive CAD (25-49%) in the proximal LAD. Preventive therapy and risk factor modification are recommended. _______________  Echocardiogram 11/22/2019: Impressions: 1. Left ventricular ejection fraction, by visual estimation, is 60 to  65%. The left ventricle has normal function. There is no left ventricular  hypertrophy.  2. The left ventricle has no regional wall motion abnormalities.  3. Global right ventricle has normal systolic function.The right  ventricular size is normal. No increase in right ventricular wall  thickness.  4. Left atrial size was moderately dilated.  5. Right atrial size was normal.  6. The mitral valve is normal in structure. Trivial mitral valve  regurgitation. No evidence of mitral stenosis.  7. The tricuspid valve is normal in structure.  8. The tricuspid valve is  normal in structure. Tricuspid valve  regurgitation is mild.  9. The aortic valve is tricuspid. Aortic valve regurgitation is trivial.  Mild aortic valve stenosis.  10. Pulmonic regurgitation is mild.  11. The pulmonic valve was grossly normal. Pulmonic valve regurgitation is  mild.  12. Mildly elevated pulmonary artery systolic pressure.  13. The inferior vena cava is normal in size with greater than 50%  respiratory variability, suggesting right atrial pressure of 3 mmHg.   Labs/Other Tests and Data Reviewed:    EKG: Most recent EKG from 10/26/2019 personally reviewed and demonstrates:  Normal sinus rhythm, rate 64 bpm, with known RBBB but no acute ischemic changes.   Recent Labs: 01/04/2020: ALT 26; BUN 19; Creatinine, Ser 0.79; Hemoglobin 13.9; Platelets 188; Potassium 4.2; Sodium 142   Recent Lipid Panel No results found for: CHOL, TRIG, HDL, CHOLHDL, LDLCALC, LDLDIRECT  Wt Readings from Last 3 Encounters:  12/10/20 204 lb (92.5 kg)  09/12/20 202 lb (91.6 kg)  09/10/20 203 lb 2 oz (92.1 kg)     Objective:    Vital Signs:  BP 137/70    Pulse 64   Ht 6\' 1"  (1.854 m)   Wt 204 lb (92.5 kg)   BMI 26.91 kg/m    Vital Signs Reviewed. General: No acute distress. Pulm: No labored breathing. No coughing during visit. No audible wheezing. Speaking in full sentences. Neuro: Alert and oriented. No slurred speech. Answers questions appropriately. Psych: Pleasant affect.  ASSESSMENT & PLAN:    History of Chest Pain Mild Non-Obstructive CAD - Mild non-obstructive disease noted on coronary CTA in 11/2019. Coronary calcium score 148 at that time. - No chest pain - Continue statin. - Given age and only mild disease, will hold off on starting statin per recent guidelines.   Palpitations - History of palpitations in the past which were felt to be due to PACs/PVCS.  - Well controlled. No recent problems.  Hypertension - BP well controlled. - Continue Losartan 50 daily.  Hyperlipidemia - Most recent lipid panel from 04/2020: Total Cholesterol 169, Triglycerides 75, HDL 46, LDL 108.  - LDL goal <70 given CAD. - Currently on Crestor 10mg  daily. Crestor was increased to 40mg  after last lipid check but he did not tolerate this due to myalgia. He also reports he has tried 20mg  before but also did not tolerate this. Patient states he his diet has improved since his lipids were last checked. - Will check lipid panel and CMET. If LDL still above goal, may need to try switching to Lipitor or adding Zetia.  Possible Sleep Apnea - Patient reports difficult breathing when laying on his back and wife describes episodes of apnea at night.  - Offered sleep study but patient states he gets claustrophobic and does not think he would tolerate a CPAP mask. Therefore, he would like to hold off on this for now.  History of Rheumatic Fever - History of rheumatic fever as a child.  - Echo from 11/2019 showed LVEF of 60-65% with mild AS, trivial AI, trivial MR, mild TR, and mild PR. - Can continue routine monitoring as long as patient remains  asymptomatic.    Time:   Today, I have spent 15 minutes with the patient with telehealth technology discussing the above problems.     Medication Adjustments/Labs and Tests Ordered: Current medicines are reviewed at length with the patient today.  Concerns regarding medicines are outlined above.   Follow Up:  In Person in 1 year(s) with Dr. Stanford Breed.  Signed, Darreld Mclean, PA-C  12/10/2020 9:49 AM     [facility]

## 2020-12-09 DIAGNOSIS — Z9841 Cataract extraction status, right eye: Secondary | ICD-10-CM | POA: Diagnosis not present

## 2020-12-09 DIAGNOSIS — Z961 Presence of intraocular lens: Secondary | ICD-10-CM | POA: Diagnosis not present

## 2020-12-09 DIAGNOSIS — H18513 Endothelial corneal dystrophy, bilateral: Secondary | ICD-10-CM | POA: Diagnosis not present

## 2020-12-09 DIAGNOSIS — Z48810 Encounter for surgical aftercare following surgery on the sense organs: Secondary | ICD-10-CM | POA: Diagnosis not present

## 2020-12-10 ENCOUNTER — Encounter: Payer: Self-pay | Admitting: Student

## 2020-12-10 ENCOUNTER — Telehealth (INDEPENDENT_AMBULATORY_CARE_PROVIDER_SITE_OTHER): Payer: Medicare Other | Admitting: Student

## 2020-12-10 VITALS — BP 137/70 | HR 64 | Ht 73.0 in | Wt 204.0 lb

## 2020-12-10 DIAGNOSIS — E785 Hyperlipidemia, unspecified: Secondary | ICD-10-CM

## 2020-12-10 DIAGNOSIS — I2510 Atherosclerotic heart disease of native coronary artery without angina pectoris: Secondary | ICD-10-CM | POA: Diagnosis not present

## 2020-12-10 DIAGNOSIS — R002 Palpitations: Secondary | ICD-10-CM | POA: Diagnosis not present

## 2020-12-10 DIAGNOSIS — I10 Essential (primary) hypertension: Secondary | ICD-10-CM | POA: Diagnosis not present

## 2020-12-10 DIAGNOSIS — Z8679 Personal history of other diseases of the circulatory system: Secondary | ICD-10-CM | POA: Diagnosis not present

## 2020-12-10 NOTE — Patient Instructions (Signed)
Medication Instructions:  No Changes *If you need a refill on your cardiac medications before your next appointment, please call your pharmacy*   Lab Work: CMP, Lipid Panel If you have labs (blood work) drawn today and your tests are completely normal, you will receive your results only by: Marland Kitchen MyChart Message (if you have MyChart) OR . A paper copy in the mail If you have any lab test that is abnormal or we need to change your treatment, we will call you to review the results.   Testing/Procedures: No Testing    Follow-Up: At [facility], you and your health needs are our priority.  As part of our continuing mission to provide you with exceptional heart care, we have created designated Provider Care Teams.  These Care Teams include your primary Cardiologist (physician) and Advanced Practice Providers (APPs -  Physician Assistants and Nurse Practitioners) who all work together to provide you with the care you need, when you need it.  Your next appointment:   1 year(s)  The format for your next appointment:   In Person  Provider:   Kirk Ruths, MD

## 2020-12-12 DIAGNOSIS — H18513 Endothelial corneal dystrophy, bilateral: Secondary | ICD-10-CM | POA: Diagnosis not present

## 2020-12-12 DIAGNOSIS — H18321 Folds in Descemet's membrane, right eye: Secondary | ICD-10-CM | POA: Diagnosis not present

## 2020-12-12 DIAGNOSIS — H18031 Corneal deposits in metabolic disorders, right eye: Secondary | ICD-10-CM | POA: Diagnosis not present

## 2020-12-12 DIAGNOSIS — H18511 Endothelial corneal dystrophy, right eye: Secondary | ICD-10-CM | POA: Diagnosis not present

## 2020-12-12 DIAGNOSIS — T868411 Corneal transplant failure, right eye: Secondary | ICD-10-CM | POA: Diagnosis not present

## 2020-12-16 DIAGNOSIS — Z961 Presence of intraocular lens: Secondary | ICD-10-CM | POA: Diagnosis not present

## 2020-12-16 DIAGNOSIS — H2512 Age-related nuclear cataract, left eye: Secondary | ICD-10-CM | POA: Diagnosis not present

## 2020-12-16 DIAGNOSIS — H18513 Endothelial corneal dystrophy, bilateral: Secondary | ICD-10-CM | POA: Diagnosis not present

## 2020-12-16 DIAGNOSIS — Z48810 Encounter for surgical aftercare following surgery on the sense organs: Secondary | ICD-10-CM | POA: Diagnosis not present

## 2020-12-17 ENCOUNTER — Ambulatory Visit: Payer: Medicare Other | Admitting: Family Medicine

## 2020-12-20 DIAGNOSIS — H18513 Endothelial corneal dystrophy, bilateral: Secondary | ICD-10-CM | POA: Diagnosis not present

## 2020-12-20 DIAGNOSIS — H2512 Age-related nuclear cataract, left eye: Secondary | ICD-10-CM | POA: Diagnosis not present

## 2020-12-20 DIAGNOSIS — Z961 Presence of intraocular lens: Secondary | ICD-10-CM | POA: Diagnosis not present

## 2020-12-20 DIAGNOSIS — Z79899 Other long term (current) drug therapy: Secondary | ICD-10-CM | POA: Diagnosis not present

## 2020-12-20 DIAGNOSIS — Z947 Corneal transplant status: Secondary | ICD-10-CM | POA: Diagnosis not present

## 2021-01-02 DIAGNOSIS — I10 Essential (primary) hypertension: Secondary | ICD-10-CM | POA: Diagnosis not present

## 2021-01-02 DIAGNOSIS — E785 Hyperlipidemia, unspecified: Secondary | ICD-10-CM | POA: Diagnosis not present

## 2021-01-02 DIAGNOSIS — R002 Palpitations: Secondary | ICD-10-CM | POA: Diagnosis not present

## 2021-01-02 DIAGNOSIS — I2510 Atherosclerotic heart disease of native coronary artery without angina pectoris: Secondary | ICD-10-CM | POA: Diagnosis not present

## 2021-01-03 DIAGNOSIS — Z96611 Presence of right artificial shoulder joint: Secondary | ICD-10-CM | POA: Diagnosis not present

## 2021-01-03 DIAGNOSIS — Z471 Aftercare following joint replacement surgery: Secondary | ICD-10-CM | POA: Diagnosis not present

## 2021-01-03 LAB — COMPREHENSIVE METABOLIC PANEL
ALT: 17 IU/L (ref 0–44)
AST: 17 IU/L (ref 0–40)
Albumin/Globulin Ratio: 1.8 (ref 1.2–2.2)
Albumin: 4.6 g/dL (ref 3.7–4.7)
Alkaline Phosphatase: 78 IU/L (ref 44–121)
BUN/Creatinine Ratio: 17 (ref 10–24)
BUN: 14 mg/dL (ref 8–27)
Bilirubin Total: 0.3 mg/dL (ref 0.0–1.2)
CO2: 27 mmol/L (ref 20–29)
Calcium: 9.7 mg/dL (ref 8.6–10.2)
Chloride: 103 mmol/L (ref 96–106)
Creatinine, Ser: 0.81 mg/dL (ref 0.76–1.27)
Globulin, Total: 2.5 g/dL (ref 1.5–4.5)
Glucose: 111 mg/dL — ABNORMAL HIGH (ref 65–99)
Potassium: 4.8 mmol/L (ref 3.5–5.2)
Sodium: 145 mmol/L — ABNORMAL HIGH (ref 134–144)
Total Protein: 7.1 g/dL (ref 6.0–8.5)
eGFR: 92 mL/min/{1.73_m2} (ref >59)

## 2021-01-03 LAB — LIPID PANEL
Chol/HDL Ratio: 3.6 ratio (ref 0.0–5.0)
Cholesterol, Total: 197 mg/dL (ref 100–199)
HDL: 55 mg/dL (ref >39)
LDL Chol Calc (NIH): 125 mg/dL — ABNORMAL HIGH (ref 0–99)
Triglycerides: 94 mg/dL (ref 0–149)
VLDL Cholesterol Cal: 17 mg/dL (ref 5–40)

## 2021-01-09 NOTE — Progress Notes (Signed)
[place] Phone: [phone] Subjective:   Cory Barnett, am serving as a scribe for Dr. Hulan [name]. This visit occurred during the SARS-CoV-2 public health emergency.  Safety protocols were in place, including screening questions prior to the visit, additional usage of staff PPE, and extensive cleaning of exam room while observing appropriate contact time as indicated for disinfecting solutions.   I'm seeing this patient by the request  of:  Crist Infante, MD  CC: Bilateral knee pain  YNW:[id number]   09/15/2020 Patient has very mild arthritis.  Very small loose body noted in the posterior lateral compartment of the knee.  Discussed with patient about different treatment options but patient is having Barnett locking or giving out on him.  Seems to be doing well.  Barnett swelling noted.  We discussed if any internal derangement signs then we will need to consider advanced imaging or other treatment options.  Patient would like to continue with conservative therapy at this time  Patient is making some progress.  Gabapentin is helping.  Only taking 100 mg at this time and we encouraged him that he could increase it to 200 mg.  Patient will continue to stay active.  Patient would not want any further imaging because it would not change medical management.  Discussed with patient about icing regimen and home exercises.  Follow-up with me again in 3 months  Update 01/14/2021 Cory Barnett is a 75 y.o. male coming in with complaint of bilateral knee pain. Patient states that he has been having bilateral hip pain. L>R. Pain over greater trochanter. Gabapentin was been helping but states that he is needing more of it to get any results. Has since quit using it. Is now using Advil for pain relief. Would like to get to root of problem vs taking medication. Is open to physical therapy and other imaging.      Past Medical History:   Diagnosis Date  . Anxiety   . AV block, 1st degree   . Basal cell carcinoma   . Cardiac murmur   . Cataract   . Diverticulosis   . Elevated PSA measurement 2018  . Hyperlipidemia   . Hypertension 2015  . 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 IFG (impaired fasting glucose) 01/2016  . Pre-diabetes   . RBBB (right bundle branch block)   . Rheumatic fever    age 71 or 23, in hospital for a month, out of school for a yr  . Seasonal allergies   . Squamous cell carcinoma   . Testicular hypofunction    Past Surgical History:  Procedure Laterality Date  . EYE SURGERY     right eye  cataract surgery with lens implant  . NASAL SEPTUM SURGERY    . NOSE SURGERY    . 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 SHOULDER ARTHROSCOPY WITH SUBACROMIAL DECOMPRESSION Right 08/07/2019   Procedure: SHOULDER ARTHROSCOPY WITH SUBACROMIAL DECOMPRESSION;  Surgeon: Tania Ade, MD;  Location: [facility];  Service: Orthopedics;  Laterality: Right;  . SKIN CANCER EXCISION     basal and squamous cell   . TONSILLECTOMY    . VASECTOMY    . WISDOM TOOTH EXTRACTION     Social History   Socioeconomic History  . Marital status: Married    Spouse name:  Not on file  . Number of children: 3  . Years of education: Not on file  . Highest education level: Not on file  Occupational History    Comment: Retired  Tobacco Use  . Smoking status: Former Smoker    Types: Cigarettes    Quit date: 12/14/1969    Years since quitting: 51.1  . Smokeless tobacco: Never Used  Vaping Use  . Vaping Use: Never used  Substance and Sexual Activity  . Alcohol use: Yes    Alcohol/week: 2.0 standard drinks    Types: 2 Cans of beer per week    Comment: social  . Drug use: Never  . Sexual activity: Not on file  Other Topics Concern  . Not on file  Social History  Narrative  . Not on file   Social Determinants of Health   Financial Resource Strain: Not on file  Food Insecurity: Not on file  Transportation Needs: Not on file  Physical Activity: Not on file  Stress: Not on file  Social Connections: Not on file   Allergies  Allergen Reactions  . Bee Venom Anaphylaxis  . Moxifloxacin Shortness Of Breath   Family History  Problem Relation Age of Onset  . Stroke Mother   . Arthritis Mother   . Cancer Mother        unknown   . Dementia Mother   . Arthritis Father   . Mesothelioma Father   . Arthritis Sister   . Arthritis Sister   . Bipolar disorder Sister      Current Outpatient Medications (Cardiovascular):  .  losartan (COZAAR) 50 MG tablet, Take by mouth. .  rosuvastatin (CRESTOR) 10 MG tablet, Take 1 tablet (10 mg total) by mouth daily.     Current Outpatient Medications (Other):  Marland Kitchen  Barberry-Oreg Grape-Goldenseal (BERBERINE COMPLEX PO), Take by mouth. .  CHELATED MAGNESIUM PO, Take 2 tablets by mouth in the morning and at bedtime.  .  Cholecalciferol (VITAMIN D3) 1.25 MG (50000 UT) CAPS, Take by mouth. .  Coenzyme Q10 (CO Q 10) 100 MG CAPS, Take 100 mg by mouth in the morning and at bedtime.  Marland Kitchen  escitalopram (LEXAPRO) 10 MG tablet, Take 10 mg by mouth daily.  .  Investigational omega-3-fatty acid/placebo capsule [id number], Take 4 capsules by mouth 2 (two) times daily. Take with food. .  Multiple Vitamin (MULTI-VITAMINS) TABS, Take 3 tablets by mouth in the morning and at bedtime.  .  Probiotic Product (PROBIOTIC PO), Take 1 capsule by mouth daily. .  Saw Palmetto, Serenoa repens, (SAW PALMETTO PO), Take 2 capsules by mouth in the morning and at bedtime.   Reviewed prior external information including notes and imaging from  primary care provider As well as notes that were available from care everywhere and other healthcare systems.  Past medical history, social, surgical and family history all reviewed in electronic medical  record.  Barnett pertanent information unless stated regarding to the chief complaint.   Review of Systems:  Barnett headache, visual changes, nausea, vomiting, diarrhea, constipation, dizziness, abdominal pain, skin rash, fevers, chills, night sweats, weight loss, swollen lymph nodes, body aches, joint swelling, chest pain, shortness of breath, mood changes. POSITIVE muscle aches  Objective  Blood pressure 138/82, pulse 69, height 6\' 1"  (1.854 m), weight 219 lb (99.3 kg), SpO2 96 %.   General: Barnett apparent distress alert and oriented x3 mood and affect normal, dressed appropriately.  HEENT: Pupils equal, extraocular movements intact  Respiratory: Patient's speak in full sentences  and does not appear short of breath  Cardiovascular: Barnett lower extremity edema, non tender, Barnett erythema  Gait normal with good balance and coordination.  MSK: Low back exam does have some loss of lordosis, patient does have some degenerative changes of the lumbar spine.  Patient does have significant tightness with FABER test left greater than right.  Negative straight leg test but tightness with the hamstring.  Neurovascularly intact distally with Barnett significant weakness.  Deep tendon reflexes are symmetric bilaterally.   [id number]; 15 additional minutes spent for Therapeutic exercises as stated in above notes.  This included exercises focusing on stretching, strengthening, with significant focus on eccentric aspects.   Long term goals include an improvement in range of motion, strength, endurance as well as avoiding reinjury. Patient's frequency would include in 1-2 times a day, 3-5 times a week for a duration of 6-12 weeks. Hip strengthening exercises which included:  Pelvic tilt/bracing to help with proper recruitment of the lower abs and pelvic floor muscles  Glute strengthening to properly contract glutes without over-engaging low back and hamstrings - prone hip extension and glute bridge exercises Proper stretching techniques to  increase effectiveness for the hip flexors, groin, quads, piriformic and low back when appropriate    Proper technique shown and discussed handout in great detail with ATC.  All questions were discussed and answered.     Impression and Recommendations:     The above documentation has been reviewed and is accurate and complete Lyndal Pulley, DO

## 2021-01-13 DIAGNOSIS — E785 Hyperlipidemia, unspecified: Secondary | ICD-10-CM

## 2021-01-14 ENCOUNTER — Encounter: Payer: Self-pay | Admitting: Family Medicine

## 2021-01-14 ENCOUNTER — Ambulatory Visit (INDEPENDENT_AMBULATORY_CARE_PROVIDER_SITE_OTHER): Payer: Medicare Other | Admitting: Family Medicine

## 2021-01-14 ENCOUNTER — Other Ambulatory Visit: Payer: Self-pay

## 2021-01-14 DIAGNOSIS — G5702 Lesion of sciatic nerve, left lower limb: Secondary | ICD-10-CM | POA: Diagnosis not present

## 2021-01-14 DIAGNOSIS — I2510 Atherosclerotic heart disease of native coronary artery without angina pectoris: Secondary | ICD-10-CM | POA: Diagnosis not present

## 2021-01-14 MED ORDER — EZETIMIBE 10 MG PO TABS: 10.0000 mg | ORAL_TABLET | Freq: Every day | ORAL | 2 refills | Status: DC

## 2021-01-14 NOTE — Patient Instructions (Signed)
GT exercises Voltaren gel Turmeric but watch for bruising or reflux-do not take other antiinflammatories Ice after activity Start chair yoga Tart cherry extract 1200mg  at night See me again in 6 weeks-if worse will get MRI

## 2021-01-14 NOTE — Assessment & Plan Note (Signed)
Patient has had difficulty with this previously.  I do believe that patient is having some lumbar radiculopathy that could be contributing.  Patient also has signs that seems to be more of a greater trochanteric bursitis.  Patient at this point would like to continue with conservative therapy.  Patient is going through a lot with his significant other going through cancer treatment as well.  We discussed the potential for injection which patient declined.  Patient also declined formal physical therapy.  Will start with home exercises, icing regimen, encourage new mattress, follow-up again in 6 weeks.  Patient knows if worsening symptoms I do feel that MRI of the lumbar spine with potential epidurals could be beneficial as well.

## 2021-02-25 ENCOUNTER — Ambulatory Visit: Payer: Medicare Other | Admitting: Family Medicine

## 2021-03-06 ENCOUNTER — Other Ambulatory Visit: Payer: Self-pay | Admitting: Cardiology

## 2021-03-06 DIAGNOSIS — E7800 Pure hypercholesterolemia, unspecified: Secondary | ICD-10-CM

## 2021-03-12 DIAGNOSIS — Z961 Presence of intraocular lens: Secondary | ICD-10-CM | POA: Diagnosis not present

## 2021-03-12 DIAGNOSIS — H26491 Other secondary cataract, right eye: Secondary | ICD-10-CM | POA: Diagnosis not present

## 2021-03-12 DIAGNOSIS — H18513 Endothelial corneal dystrophy, bilateral: Secondary | ICD-10-CM | POA: Diagnosis not present

## 2021-03-12 DIAGNOSIS — H2512 Age-related nuclear cataract, left eye: Secondary | ICD-10-CM | POA: Diagnosis not present

## 2021-03-12 DIAGNOSIS — Z947 Corneal transplant status: Secondary | ICD-10-CM | POA: Diagnosis not present

## 2021-03-19 DIAGNOSIS — R509 Fever, unspecified: Secondary | ICD-10-CM | POA: Diagnosis not present

## 2021-03-19 DIAGNOSIS — Z1152 Encounter for screening for COVID-19: Secondary | ICD-10-CM | POA: Diagnosis not present

## 2021-03-19 DIAGNOSIS — H6591 Unspecified nonsuppurative otitis media, right ear: Secondary | ICD-10-CM | POA: Diagnosis not present

## 2021-03-19 DIAGNOSIS — R059 Cough, unspecified: Secondary | ICD-10-CM | POA: Diagnosis not present

## 2021-03-19 DIAGNOSIS — J309 Allergic rhinitis, unspecified: Secondary | ICD-10-CM | POA: Diagnosis not present

## 2021-03-21 DIAGNOSIS — M25511 Pain in right shoulder: Secondary | ICD-10-CM | POA: Diagnosis not present

## 2021-03-26 DIAGNOSIS — H65194 Other acute nonsuppurative otitis media, recurrent, right ear: Secondary | ICD-10-CM | POA: Diagnosis not present

## 2021-04-12 ENCOUNTER — Other Ambulatory Visit: Payer: Self-pay | Admitting: Student

## 2021-04-12 DIAGNOSIS — E785 Hyperlipidemia, unspecified: Secondary | ICD-10-CM

## 2021-04-15 DIAGNOSIS — H903 Sensorineural hearing loss, bilateral: Secondary | ICD-10-CM | POA: Diagnosis not present

## 2021-04-15 DIAGNOSIS — H6981 Other specified disorders of Eustachian tube, right ear: Secondary | ICD-10-CM | POA: Diagnosis not present

## 2021-04-19 IMAGING — CT CT HEART MORP W/ CTA COR W/ SCORE W/ CA W/CM &/OR W/O CM
4 of 7 series · 8 of 20 positions shown, 9 images · IV contrast (APPLIED)
Comparison: None.

Addendum:
CLINICAL DATA: 73-year-old male with h/o hypertension, treated
hyperlipidemia, palpitations and fatigue.

EXAM:
Cardiac/Coronary  CTA
TECHNIQUE: The patient was scanned on a Phillips Force scanner.

[Series 6: best diast 73 % · axial · 0.38mm/px · z∈[+1175,+1222]mm · 2 of 356 slices shown, 3 images]
[im 119/356  vessel]
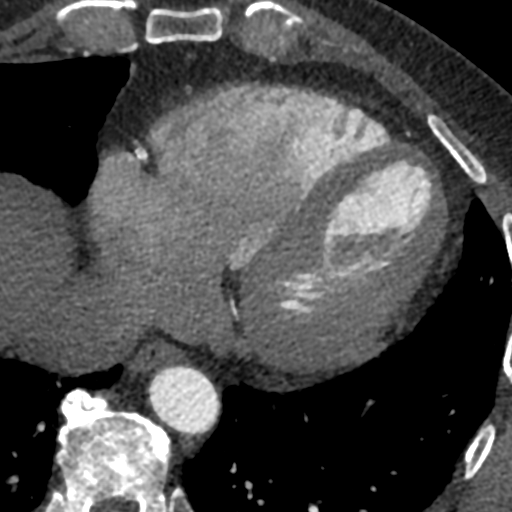
[im 119/356  lung]
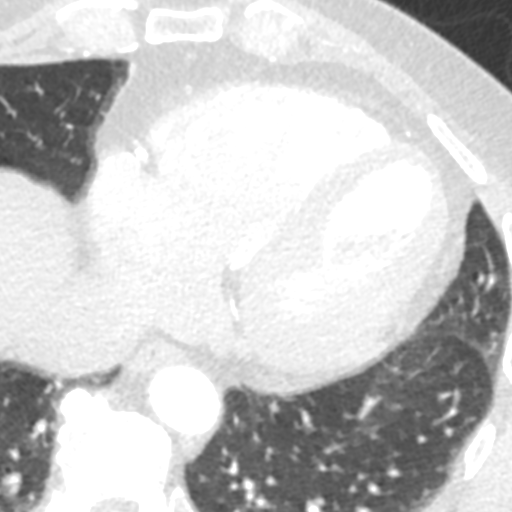
[im 237/356  vessel]
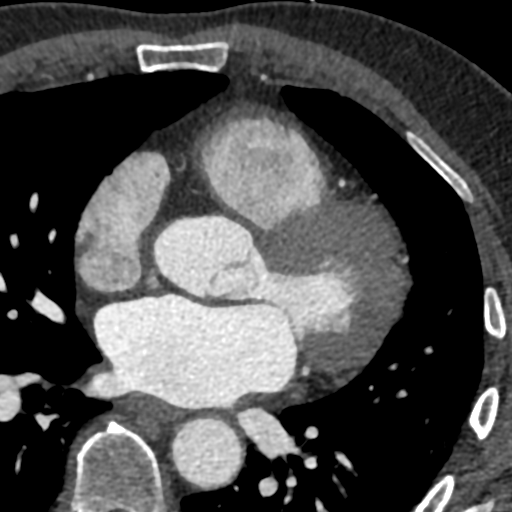

[Series 7: best syst 32 % · axial · 0.38mm/px · z∈[+1175,+1222]mm · 2 of 356 slices shown]
[im 119/356  vessel]
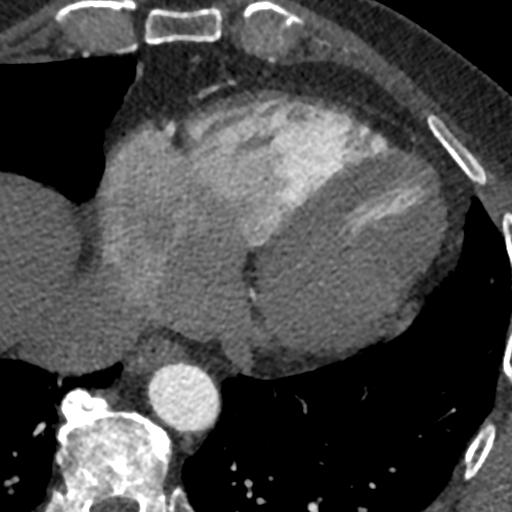
[im 237/356  vessel]
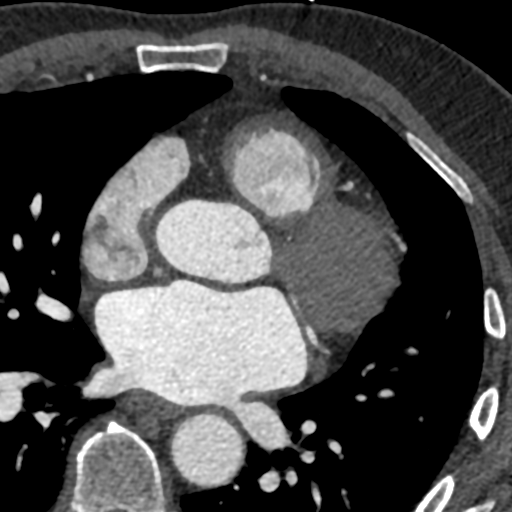

[Series 8: ts diast sharp 32 % · axial · 0.38mm/px · z∈[+1175,+1222]mm · 2 of 356 slices shown]
[im 119/356  lung]
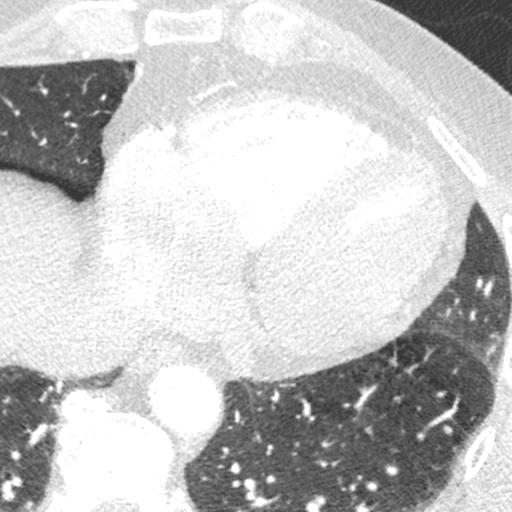
[im 237/356  lung]
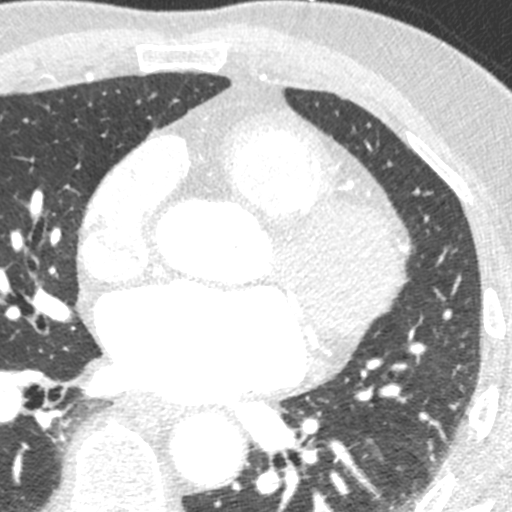

[Series 9: ts syst sharp 32 % · axial · 0.38mm/px · z∈[+1175,+1222]mm · 2 of 356 slices shown]
[im 119/356  lung]
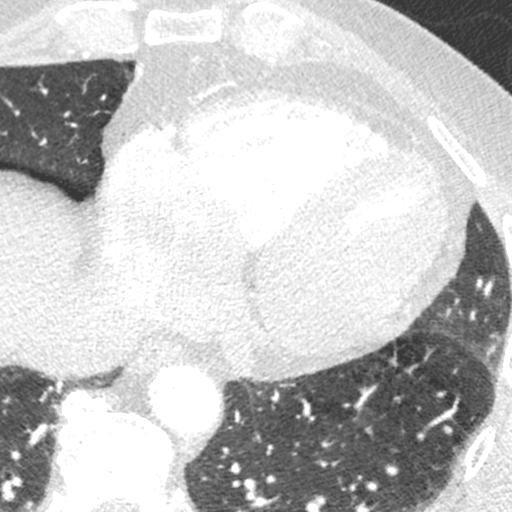
[im 237/356  lung]
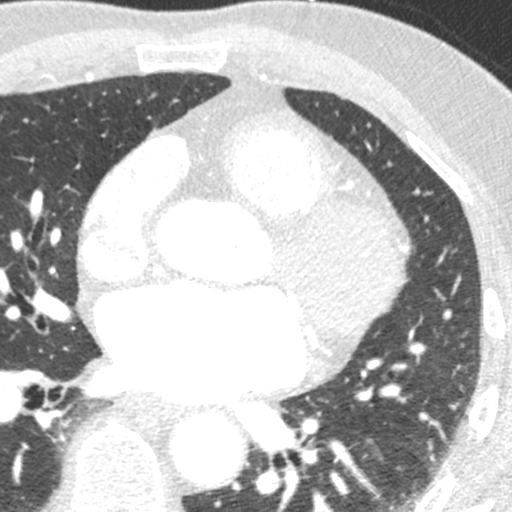

[8 of 20 positions shown; findings below may reference images not displayed]

FINDINGS: A 100 kV prospective scan was triggered in the descending thoracic
aorta at 111 HU's. Axial non-contrast 3 mm slices were carried out
through the heart. The data set was analyzed on a dedicated work
station and scored using the Agatson method. Gantry rotation speed
was 250 msecs and collimation was .6 mm. 100 mg of PO Metoprolol and
0.8 mg of sl NTG was given. The 3D data set was reconstructed in 5%
intervals of the 67-82 % of the R-R cycle. Diastolic phases were
analyzed on a dedicated work station using MPR, MIP and VRT modes.
The patient received 80 cc of contrast.

Aorta: Normal size. Minimal diffuse atherosclerotic plaque. No
dissection.

Aortic Valve:  Trileaflet.  No calcifications.

Coronary Arteries:  Normal coronary origin.  Right dominance.

RCA is a large dominant artery that gives rise to large PDA and PLA
arteries. There is minimal calcified plaque in the ostial, proximal
and distal RCA with stenosis 0-25%.

Left main is a large artery that gives rise to LAD, a large ramus
intermedius and LCX arteries. Left main has no plaque.

LAD is a medium caliber vessel that gives rise to a very small
diagonal artery. Proximal LAD has mild mixed, predominantly
calcified plaque with stenosis 25-49%.

Ramus intermedius is a large artery that only has luminal
irregularities.

LCX is a non-dominant artery that gives rise to one very small OM1
branch. There are only luminal irregularities.

Other findings:

Normal pulmonary vein drainage into the left atrium.

Normal left atrial appendage without a thrombus.

Normal size of the pulmonary artery.
IMPRESSION: 1. Coronary calcium score of 148. This was 42 percentile for age and
sex matched control.

2. Normal coronary origin with right dominance.

3. CAD-RADS 2. Mild non-obstructive CAD (25-49%) in the proximal
LAD. Preventive therapy and risk factor modification are
recommended.

EXAM:
OVER-READ INTERPRETATION  CT CHEST

The following report is an over-read performed by radiologist Dr.
over-read does not include interpretation of cardiac or coronary
anatomy or pathology. The coronary calcium score and cardiac CTA
interpretation by the cardiologist is attached.
FINDINGS: Aortic atherosclerosis. Calcified granuloma in the left lower lobe.
Within the visualized portions of the thorax there are no suspicious
appearing pulmonary nodules or masses, there is no acute
consolidative airspace disease, no pleural effusions, no
pneumothorax and no lymphadenopathy. Visualized portions of the
upper abdomen are unremarkable. There are no aggressive appearing
lytic or blastic lesions noted in the visualized portions of the
skeleton.
IMPRESSION: 1.  Aortic Atherosclerosis ([id number]).

*** End of Addendum ***
FINDINGS: A 100 kV prospective scan was triggered in the descending thoracic
aorta at 111 HU's. Axial non-contrast 3 mm slices were carried out
through the heart. The data set was analyzed on a dedicated work
station and scored using the Agatson method. Gantry rotation speed
was 250 msecs and collimation was .6 mm. 100 mg of PO Metoprolol and
0.8 mg of sl NTG was given. The 3D data set was reconstructed in 5%
intervals of the 67-82 % of the R-R cycle. Diastolic phases were
analyzed on a dedicated work station using MPR, MIP and VRT modes.
The patient received 80 cc of contrast.

Aorta: Normal size. Minimal diffuse atherosclerotic plaque. No
dissection.

Aortic Valve:  Trileaflet.  No calcifications.

Coronary Arteries:  Normal coronary origin.  Right dominance.

RCA is a large dominant artery that gives rise to large PDA and PLA
arteries. There is minimal calcified plaque in the ostial, proximal
and distal RCA with stenosis 0-25%.

Left main is a large artery that gives rise to LAD, a large ramus
intermedius and LCX arteries. Left main has no plaque.

LAD is a medium caliber vessel that gives rise to a very small
diagonal artery. Proximal LAD has mild mixed, predominantly
calcified plaque with stenosis 25-49%.

Ramus intermedius is a large artery that only has luminal
irregularities.

LCX is a non-dominant artery that gives rise to one very small OM1
branch. There are only luminal irregularities.

Other findings:

Normal pulmonary vein drainage into the left atrium.

Normal left atrial appendage without a thrombus.

Normal size of the pulmonary artery.
IMPRESSION: 1. Coronary calcium score of 148. This was 42 percentile for age and
sex matched control.

2. Normal coronary origin with right dominance.

3. CAD-RADS 2. Mild non-obstructive CAD (25-49%) in the proximal
LAD. Preventive therapy and risk factor modification are
recommended.

## 2021-05-05 ENCOUNTER — Other Ambulatory Visit: Payer: Self-pay | Admitting: Physician Assistant

## 2021-05-05 MED ORDER — NIRMATRELVIR/RITONAVIR (PAXLOVID)TABLET: 3.0000 | ORAL_TABLET | Freq: Two times a day (BID) | ORAL | 0 refills | Status: AC

## 2021-05-05 NOTE — Progress Notes (Signed)
Outpatient Oral COVID Treatment Note  I connected with Cory Barnett on 05/05/2021/1:32 PM by telephone and verified that I am speaking with the correct person using two identifiers.  I discussed the limitations, risks, security, and privacy concerns of performing an evaluation and management service by telephone and the availability of in person appointments. I also discussed with the patient that there may be a patient responsible charge related to this service. The patient expressed understanding and agreed to proceed.  Patient location: home Provider location: office  Diagnosis: COVID-19 infection  Purpose of visit: Discussion of potential use of Molnupiravir or Paxlovid, a new treatment for mild to moderate COVID-19 viral infection in non-hospitalized patients.   Subjective: Patient is a 75 y.o. male who has been diagnosed with COVID 19 viral infection.  Their symptoms began on 7/3 with cough and sore throat.    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 , Disp: , Rfl:    Cholecalciferol (VITAMIN D3) 1.25 MG (50000 UT) CAPS, Take by mouth., Disp: , Rfl:    Coenzyme Q10 (CO Q 10) 100 MG CAPS, Take 100 mg by mouth in the morning and  at bedtime. , Disp: , Rfl:    escitalopram (LEXAPRO) 10 MG tablet, Take 10 mg by mouth daily. , Disp: , Rfl:    ezetimibe (ZETIA) 10 MG tablet, TAKE 1 TABLET BY MOUTH EVERY DAY, Disp: 90 tablet, Rfl: 3   Investigational omega-3-fatty acid/placebo capsule [id number], Take 4 capsules by mouth 2 (two) times daily. Take with food., Disp: , Rfl:    losartan (COZAAR) 50 MG tablet, Take by mouth., Disp: , Rfl:    Multiple Vitamin (MULTI-VITAMINS) TABS, Take 3 tablets by mouth in the morning and at bedtime. , Disp: , Rfl:    Probiotic Product (PROBIOTIC PO), Take 1 capsule by mouth daily., Disp: , Rfl:    rosuvastatin (CRESTOR) 10 MG tablet, TAKE 1 TABLET BY MOUTH EVERY DAY, Disp: 90 tablet, Rfl: 3   Saw Palmetto, Serenoa repens, (SAW PALMETTO PO), Take 2 capsules by mouth in the morning and at bedtime., Disp: , Rfl:   Objective: Patient sounds stable.  They are in no apparent distress.  Breathing is non labored.  Mood and behavior are normal.  Laboratory Data:  No results found for this or any previous visit (from the past 2160 hour(s)).   Assessment: 75 y.o. male with mild/moderate COVID 19 viral infection diagnosed on 7/4 at high risk for progression to severe COVID 19.  Plan:  This patient is a 75 y.o. male  that meets the following criteria for Emergency Use Authorization of: Paxlovid 1. Age >12 yr AND > 40 kg 2. SARS-COV-2 positive test 3. Symptom onset < 5 days 4. Mild-to-moderate COVID disease with high risk for severe progression to hospitalization or death  I have spoken and communicated the following to the patient or parent/caregiver regarding: Paxlovid is an unapproved drug that is authorized for use under an Emergency Use Authorization.  There are no adequate, approved, available products for the treatment of COVID-19 in adults who have mild-to-moderate COVID-19 and are at high risk for progressing to severe COVID-19, including hospitalization or death. Other therapeutics are currently  authorized. For additional information on all products authorized for treatment or prevention of COVID-19, please see [employer].  There are benefits and risks of taking this treatment as outlined in the "Fact Sheet for Patients and Caregivers."  "Fact Sheet for Patients and Caregivers" was reviewed with patient. A hard copy will be provided to patient from pharmacy prior to the patient receiving treatment. Patients should continue to self-isolate and use infection control measures (e.g., wear mask, isolate, social distance, avoid sharing personal items, clean and disinfect "high touch" surfaces, and frequent handwashing) according to CDC guidelines.  The patient or parent/caregiver has the option to accept or refuse treatment. Patient medication history was reviewed for potential drug interactions:Interaction with home meds: Crestor Patient's GFR was calculated to be >60, and they were therefore prescribed Normal dose (GFR>60) - nirmatrelvir 150mg  tab (2 tablet) by mouth twice daily AND ritonavir 100mg  tab (1 tablet) by mouth twice daily  After reviewing above information with the patient, the patient agrees to receive Paxlovid.  Follow up instructions:    Take prescription BID x 5 days as directed Reach out to pharmacist for counseling on medication if desired For concerns regarding further COVID symptoms please follow up with your PCP or urgent care For urgent or life-threatening issues, seek care at your local emergency department  The patient was provided an opportunity to ask questions, and all were answered. The patient agreed with the plan and demonstrated an understanding of the instructions.   Script sent to [facility] and opted to pick up RX.  The patient was advised to call their PCP or seek an in-person evaluation if the symptoms worsen or if the condition fails to improve as  anticipated.   I provided 10 minutes of non face-to-face telephone visit time during this encounter, and > 50% was spent counseling as documented under my assessment & plan.  Angelena Form, PA-C 05/05/2021 /1:32 PM

## 2021-05-16 DIAGNOSIS — E291 Testicular hypofunction: Secondary | ICD-10-CM | POA: Diagnosis not present

## 2021-05-16 DIAGNOSIS — R7301 Impaired fasting glucose: Secondary | ICD-10-CM | POA: Diagnosis not present

## 2021-05-16 DIAGNOSIS — Z125 Encounter for screening for malignant neoplasm of prostate: Secondary | ICD-10-CM | POA: Diagnosis not present

## 2021-05-16 DIAGNOSIS — E785 Hyperlipidemia, unspecified: Secondary | ICD-10-CM | POA: Diagnosis not present

## 2021-05-16 DIAGNOSIS — I10 Essential (primary) hypertension: Secondary | ICD-10-CM | POA: Diagnosis not present

## 2021-05-16 DIAGNOSIS — R351 Nocturia: Secondary | ICD-10-CM | POA: Diagnosis not present

## 2021-05-22 DIAGNOSIS — I700 Atherosclerosis of aorta: Secondary | ICD-10-CM | POA: Diagnosis not present

## 2021-05-22 DIAGNOSIS — Z1212 Encounter for screening for malignant neoplasm of rectum: Secondary | ICD-10-CM | POA: Diagnosis not present

## 2021-05-22 DIAGNOSIS — Z23 Encounter for immunization: Secondary | ICD-10-CM | POA: Diagnosis not present

## 2021-05-22 DIAGNOSIS — Z1331 Encounter for screening for depression: Secondary | ICD-10-CM | POA: Diagnosis not present

## 2021-05-22 DIAGNOSIS — I4510 Unspecified right bundle-branch block: Secondary | ICD-10-CM | POA: Diagnosis not present

## 2021-05-22 DIAGNOSIS — I10 Essential (primary) hypertension: Secondary | ICD-10-CM | POA: Diagnosis not present

## 2021-05-22 DIAGNOSIS — I2510 Atherosclerotic heart disease of native coronary artery without angina pectoris: Secondary | ICD-10-CM | POA: Diagnosis not present

## 2021-05-22 DIAGNOSIS — N401 Enlarged prostate with lower urinary tract symptoms: Secondary | ICD-10-CM | POA: Diagnosis not present

## 2021-05-22 DIAGNOSIS — R972 Elevated prostate specific antigen [PSA]: Secondary | ICD-10-CM | POA: Diagnosis not present

## 2021-05-22 DIAGNOSIS — E785 Hyperlipidemia, unspecified: Secondary | ICD-10-CM | POA: Diagnosis not present

## 2021-05-22 DIAGNOSIS — R7301 Impaired fasting glucose: Secondary | ICD-10-CM | POA: Diagnosis not present

## 2021-05-22 DIAGNOSIS — R82998 Other abnormal findings in urine: Secondary | ICD-10-CM | POA: Diagnosis not present

## 2021-05-22 DIAGNOSIS — M79605 Pain in left leg: Secondary | ICD-10-CM | POA: Diagnosis not present

## 2021-05-22 DIAGNOSIS — Z0000 Encounter for general adult medical examination without abnormal findings: Secondary | ICD-10-CM | POA: Diagnosis not present

## 2021-05-30 DIAGNOSIS — H02831 Dermatochalasis of right upper eyelid: Secondary | ICD-10-CM | POA: Diagnosis not present

## 2021-05-30 DIAGNOSIS — H02834 Dermatochalasis of left upper eyelid: Secondary | ICD-10-CM | POA: Diagnosis not present

## 2021-06-07 ENCOUNTER — Other Ambulatory Visit: Payer: Self-pay | Admitting: Cardiology

## 2021-06-07 DIAGNOSIS — E7800 Pure hypercholesterolemia, unspecified: Secondary | ICD-10-CM

## 2021-06-16 IMAGING — DX DG CHEST 2V
2 series · 2 of 2 positions shown · non-contrast
Comparison: 10/15/2018 except

CLINICAL DATA: Pre operative respiratory exam.

EXAM:
CHEST - 2 VIEW

[chest pa]
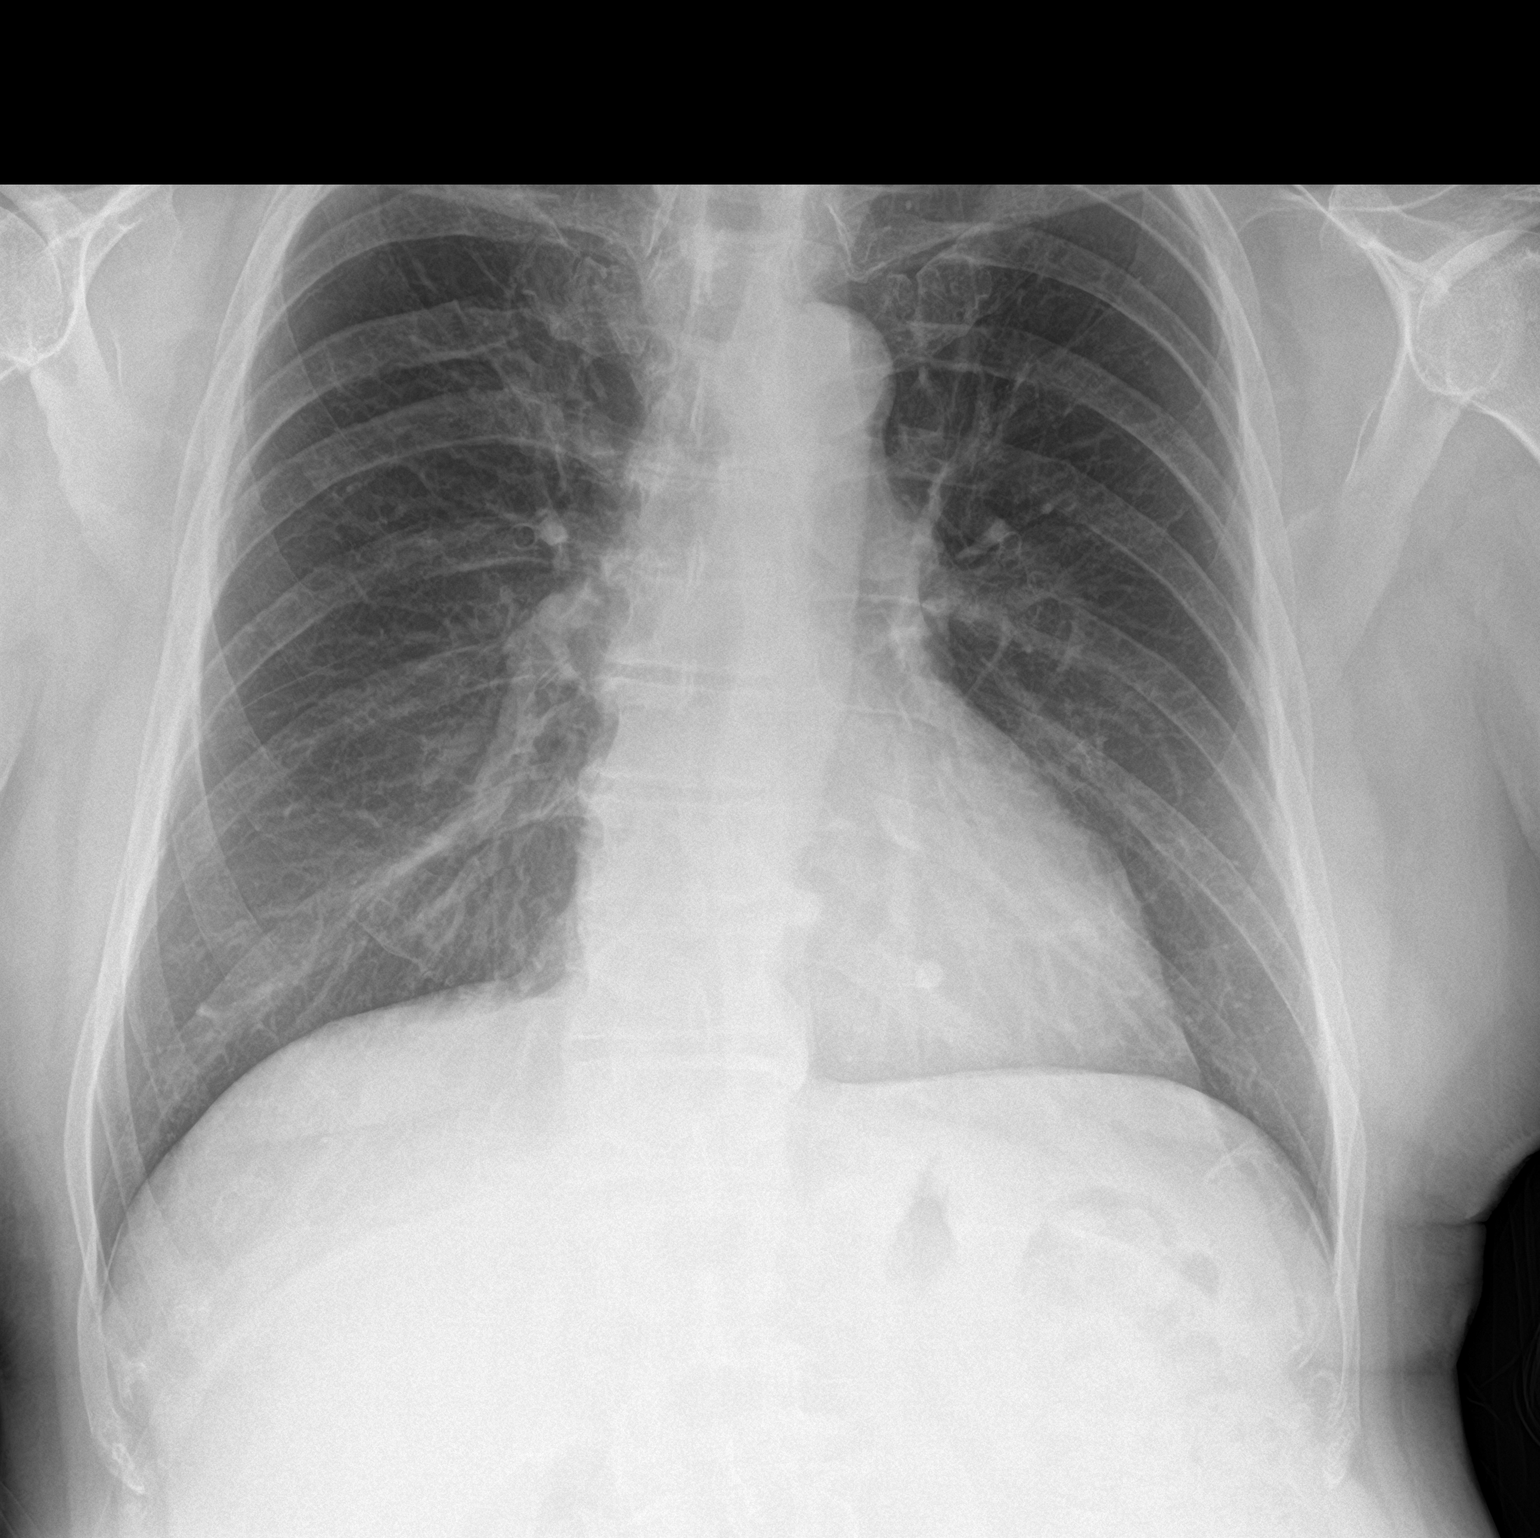

[chest lat]
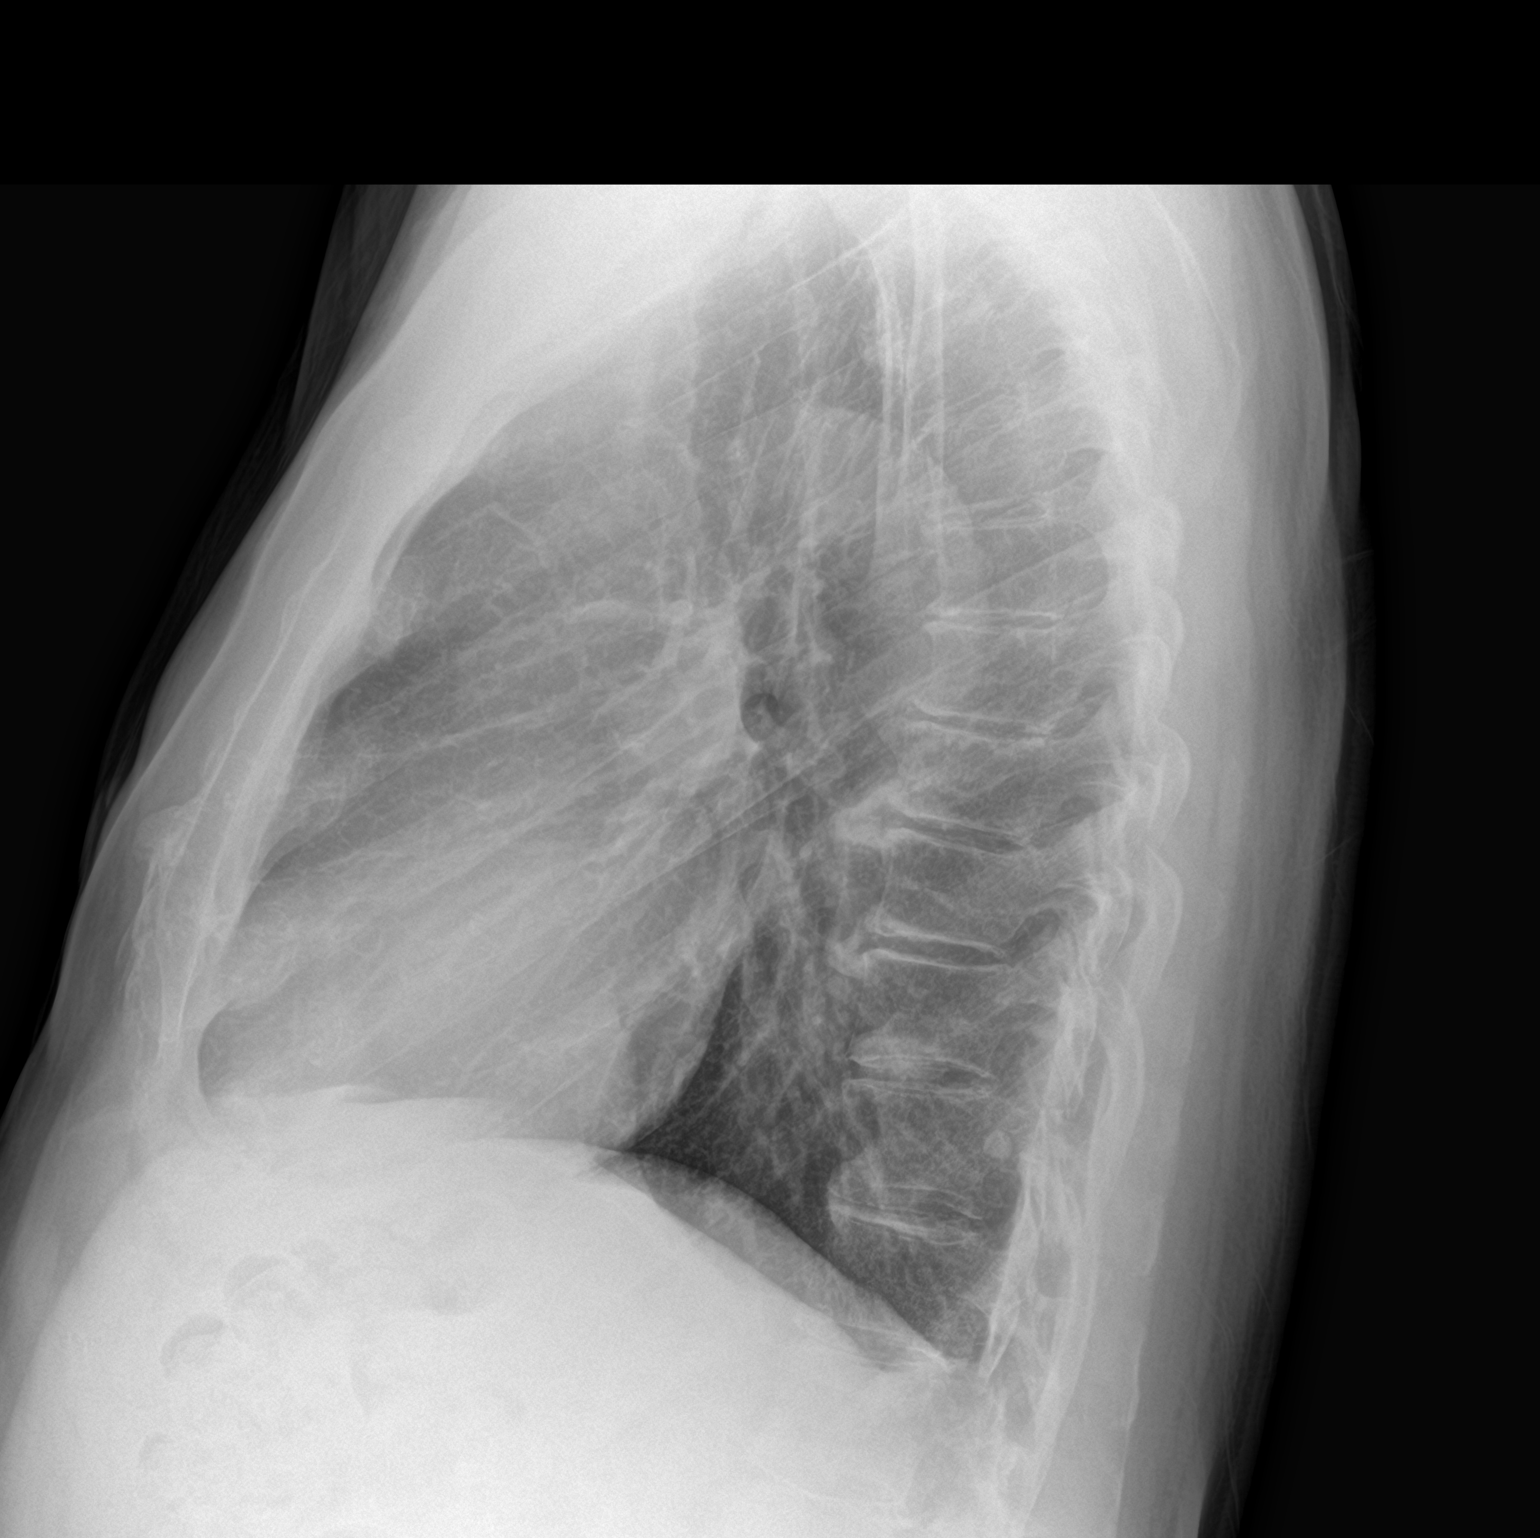

[2 of 2 positions shown; findings below may reference images not displayed]

FINDINGS: The heart size and mediastinal contours are within normal limits.
Both lungs are clear for a chronic calcified granuloma posteriorly
in the left lower lobe. No effusions. The visualized skeletal
structures are unremarkable.
IMPRESSION: No active disease.

## 2021-06-24 DIAGNOSIS — H6981 Other specified disorders of Eustachian tube, right ear: Secondary | ICD-10-CM | POA: Diagnosis not present

## 2021-07-16 DIAGNOSIS — H2512 Age-related nuclear cataract, left eye: Secondary | ICD-10-CM | POA: Diagnosis not present

## 2021-07-16 DIAGNOSIS — H02834 Dermatochalasis of left upper eyelid: Secondary | ICD-10-CM | POA: Diagnosis not present

## 2021-07-16 DIAGNOSIS — H18512 Endothelial corneal dystrophy, left eye: Secondary | ICD-10-CM | POA: Diagnosis not present

## 2021-07-16 DIAGNOSIS — Z961 Presence of intraocular lens: Secondary | ICD-10-CM | POA: Diagnosis not present

## 2021-07-16 DIAGNOSIS — Z947 Corneal transplant status: Secondary | ICD-10-CM | POA: Diagnosis not present

## 2021-07-16 DIAGNOSIS — H02831 Dermatochalasis of right upper eyelid: Secondary | ICD-10-CM | POA: Diagnosis not present

## 2021-07-28 DIAGNOSIS — H6981 Other specified disorders of Eustachian tube, right ear: Secondary | ICD-10-CM | POA: Diagnosis not present

## 2021-07-28 DIAGNOSIS — Z9622 Myringotomy tube(s) status: Secondary | ICD-10-CM | POA: Diagnosis not present

## 2021-07-28 DIAGNOSIS — H903 Sensorineural hearing loss, bilateral: Secondary | ICD-10-CM | POA: Diagnosis not present

## 2021-08-07 DIAGNOSIS — Z9622 Myringotomy tube(s) status: Secondary | ICD-10-CM | POA: Diagnosis not present

## 2021-08-13 DIAGNOSIS — I10 Essential (primary) hypertension: Secondary | ICD-10-CM | POA: Diagnosis not present

## 2021-08-13 DIAGNOSIS — H02832 Dermatochalasis of right lower eyelid: Secondary | ICD-10-CM | POA: Diagnosis not present

## 2021-08-13 DIAGNOSIS — H02831 Dermatochalasis of right upper eyelid: Secondary | ICD-10-CM | POA: Diagnosis not present

## 2021-08-13 DIAGNOSIS — D23112 Other benign neoplasm of skin of right lower eyelid, including canthus: Secondary | ICD-10-CM | POA: Diagnosis not present

## 2021-08-13 DIAGNOSIS — H029 Unspecified disorder of eyelid: Secondary | ICD-10-CM | POA: Diagnosis not present

## 2021-08-13 DIAGNOSIS — H02834 Dermatochalasis of left upper eyelid: Secondary | ICD-10-CM | POA: Diagnosis not present

## 2021-08-13 DIAGNOSIS — H02835 Dermatochalasis of left lower eyelid: Secondary | ICD-10-CM | POA: Diagnosis not present

## 2021-08-13 DIAGNOSIS — H02822 Cysts of right lower eyelid: Secondary | ICD-10-CM | POA: Diagnosis not present

## 2021-08-26 DIAGNOSIS — Z48810 Encounter for surgical aftercare following surgery on the sense organs: Secondary | ICD-10-CM | POA: Diagnosis not present

## 2021-08-30 DIAGNOSIS — Z23 Encounter for immunization: Secondary | ICD-10-CM | POA: Diagnosis not present

## 2021-09-05 DIAGNOSIS — H903 Sensorineural hearing loss, bilateral: Secondary | ICD-10-CM | POA: Diagnosis not present

## 2021-09-17 DIAGNOSIS — Z23 Encounter for immunization: Secondary | ICD-10-CM | POA: Diagnosis not present

## 2021-09-17 DIAGNOSIS — L821 Other seborrheic keratosis: Secondary | ICD-10-CM | POA: Diagnosis not present

## 2021-09-17 DIAGNOSIS — L820 Inflamed seborrheic keratosis: Secondary | ICD-10-CM | POA: Diagnosis not present

## 2021-09-19 DIAGNOSIS — Z9622 Myringotomy tube(s) status: Secondary | ICD-10-CM | POA: Diagnosis not present

## 2021-09-19 DIAGNOSIS — J384 Edema of larynx: Secondary | ICD-10-CM | POA: Diagnosis not present

## 2021-09-19 DIAGNOSIS — J343 Hypertrophy of nasal turbinates: Secondary | ICD-10-CM | POA: Diagnosis not present

## 2021-09-19 DIAGNOSIS — H6981 Other specified disorders of Eustachian tube, right ear: Secondary | ICD-10-CM | POA: Diagnosis not present

## 2021-09-19 DIAGNOSIS — H903 Sensorineural hearing loss, bilateral: Secondary | ICD-10-CM | POA: Diagnosis not present

## 2021-09-19 DIAGNOSIS — R070 Pain in throat: Secondary | ICD-10-CM | POA: Diagnosis not present

## 2021-09-19 DIAGNOSIS — Z87891 Personal history of nicotine dependence: Secondary | ICD-10-CM | POA: Diagnosis not present

## 2021-10-28 DIAGNOSIS — Z48810 Encounter for surgical aftercare following surgery on the sense organs: Secondary | ICD-10-CM | POA: Diagnosis not present

## 2021-11-19 DIAGNOSIS — L570 Actinic keratosis: Secondary | ICD-10-CM | POA: Diagnosis not present

## 2021-11-19 DIAGNOSIS — D225 Melanocytic nevi of trunk: Secondary | ICD-10-CM | POA: Diagnosis not present

## 2021-11-19 DIAGNOSIS — L821 Other seborrheic keratosis: Secondary | ICD-10-CM | POA: Diagnosis not present

## 2021-11-19 DIAGNOSIS — L578 Other skin changes due to chronic exposure to nonionizing radiation: Secondary | ICD-10-CM | POA: Diagnosis not present

## 2021-11-19 DIAGNOSIS — L814 Other melanin hyperpigmentation: Secondary | ICD-10-CM | POA: Diagnosis not present

## 2021-11-19 DIAGNOSIS — Z23 Encounter for immunization: Secondary | ICD-10-CM | POA: Diagnosis not present

## 2021-11-19 DIAGNOSIS — Z85828 Personal history of other malignant neoplasm of skin: Secondary | ICD-10-CM | POA: Diagnosis not present

## 2022-01-14 DIAGNOSIS — T169XXA Foreign body in ear, unspecified ear, initial encounter: Secondary | ICD-10-CM | POA: Diagnosis not present

## 2022-01-16 ENCOUNTER — Other Ambulatory Visit: Payer: Self-pay | Admitting: Cardiology

## 2022-01-16 DIAGNOSIS — E7800 Pure hypercholesterolemia, unspecified: Secondary | ICD-10-CM

## 2022-01-31 DIAGNOSIS — J0100 Acute maxillary sinusitis, unspecified: Secondary | ICD-10-CM | POA: Diagnosis not present

## 2022-02-10 DIAGNOSIS — H6691 Otitis media, unspecified, right ear: Secondary | ICD-10-CM | POA: Diagnosis not present

## 2022-02-10 DIAGNOSIS — H9201 Otalgia, right ear: Secondary | ICD-10-CM | POA: Diagnosis not present

## 2022-02-18 DIAGNOSIS — Z9622 Myringotomy tube(s) status: Secondary | ICD-10-CM | POA: Diagnosis not present

## 2022-02-18 DIAGNOSIS — H6981 Other specified disorders of Eustachian tube, right ear: Secondary | ICD-10-CM | POA: Diagnosis not present

## 2022-04-09 ENCOUNTER — Other Ambulatory Visit (HOSPITAL_COMMUNITY): Payer: Self-pay

## 2022-04-09 MED ORDER — SILDENAFIL CITRATE 20 MG PO TABS
ORAL_TABLET | ORAL | 0 refills | Status: DC
  Filled 2022-04-09: qty 36, 30d supply, fill #0

## 2022-04-09 MED ORDER — SILDENAFIL CITRATE 20 MG PO TABS
ORAL_TABLET | ORAL | 11 refills | Status: DC
  Filled 2022-04-09: qty 50, 30d supply, fill #0

## 2022-04-16 ENCOUNTER — Other Ambulatory Visit: Payer: Self-pay | Admitting: Cardiology

## 2022-04-16 DIAGNOSIS — E785 Hyperlipidemia, unspecified: Secondary | ICD-10-CM

## 2022-04-25 ENCOUNTER — Other Ambulatory Visit (HOSPITAL_COMMUNITY): Payer: Self-pay

## 2022-04-27 ENCOUNTER — Other Ambulatory Visit (HOSPITAL_COMMUNITY): Payer: Self-pay

## 2022-04-27 MED ORDER — SILDENAFIL CITRATE 20 MG PO TABS
ORAL_TABLET | ORAL | 11 refills | Status: DC
  Filled 2022-04-27: qty 50, 10d supply, fill #0

## 2022-04-27 MED ORDER — SILDENAFIL CITRATE 20 MG PO TABS: ORAL_TABLET | ORAL | 11 refills | Status: DC

## 2022-04-27 MED ORDER — ERYTHROMYCIN 5 MG/GM OP OINT: TOPICAL_OINTMENT | OPHTHALMIC | 2 refills | Status: DC

## 2022-04-27 MED ORDER — EZETIMIBE 10 MG PO TABS
ORAL_TABLET | ORAL | 3 refills | Status: DC
  Filled 2022-04-27: qty 90, 90d supply, fill #0

## 2022-04-27 MED ORDER — LOSARTAN POTASSIUM 50 MG PO TABS
ORAL_TABLET | ORAL | 4 refills | Status: DC
  Filled 2022-04-27: qty 90, 90d supply, fill #0

## 2022-04-27 MED ORDER — ESCITALOPRAM OXALATE 10 MG PO TABS
ORAL_TABLET | ORAL | 3 refills | Status: DC
  Filled 2022-04-27: qty 90, 90d supply, fill #0

## 2022-04-27 MED ORDER — OMEPRAZOLE 40 MG PO CPDR: DELAYED_RELEASE_CAPSULE | ORAL | 1 refills | Status: DC

## 2022-04-29 ENCOUNTER — Other Ambulatory Visit (HOSPITAL_COMMUNITY): Payer: Self-pay

## 2022-05-22 DIAGNOSIS — L439 Lichen planus, unspecified: Secondary | ICD-10-CM | POA: Diagnosis not present

## 2022-05-22 DIAGNOSIS — C4441 Basal cell carcinoma of skin of scalp and neck: Secondary | ICD-10-CM | POA: Diagnosis not present

## 2022-05-22 DIAGNOSIS — L821 Other seborrheic keratosis: Secondary | ICD-10-CM | POA: Diagnosis not present

## 2022-05-22 DIAGNOSIS — L570 Actinic keratosis: Secondary | ICD-10-CM | POA: Diagnosis not present

## 2022-05-22 DIAGNOSIS — L988 Other specified disorders of the skin and subcutaneous tissue: Secondary | ICD-10-CM | POA: Diagnosis not present

## 2022-05-22 DIAGNOSIS — D485 Neoplasm of uncertain behavior of skin: Secondary | ICD-10-CM | POA: Diagnosis not present

## 2022-05-28 NOTE — Progress Notes (Signed)
[place] Phone: [phone] Subjective:   Cory Barnett, am serving as a scribe for Dr. Hulan [name].   I'm seeing this patient by the request  of:  Crist Infante, MD  CC: Left hip pain  RWE:[id number]  Last seen March 2022 with piriformis pain  Cory Barnett is a 76 y.o. male coming in with complaint L hip pain. Patient points to SI joint and greater trochanter and states that pain has been present on and off for a year but is sharp over past few days. Patient will be walking and his leg will give out. Patient notes that seat in [place] sits further back on hamstring which is uncomfortable. Patient is using CBC and melatonin for sleep as he has had trouble sleeping.        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   IFG (impaired fasting glucose) 01/2016   Pre-diabetes    RBBB (right bundle branch block)    Rheumatic fever    age 40 or 53, in hospital for a month, out of school for a yr   Seasonal allergies    Squamous cell carcinoma    Testicular hypofunction    Past Surgical History:  Procedure Laterality Date   EYE SURGERY     right eye  cataract surgery with lens implant   NASAL SEPTUM SURGERY     NOSE SURGERY     REVERSE SHOULDER ARTHROPLASTY Right 01/11/2020   Procedure: REVERSE SHOULDER ARTHROPLASTY;  Surgeon: Tania Ade, MD;  Location: [facility];  Service: Orthopedics;  Laterality: Right;   SHOULDER ARTHROSCOPY WITH SUBACROMIAL DECOMPRESSION Right 08/07/2019   Procedure: SHOULDER ARTHROSCOPY WITH SUBACROMIAL DECOMPRESSION;  Surgeon: Tania Ade, MD;  Location: [place];  Service: Orthopedics;  Laterality: Right;   SKIN CANCER EXCISION     basal and  squamous cell    TONSILLECTOMY     VASECTOMY     WISDOM TOOTH EXTRACTION     Social History   Socioeconomic History   Marital status: Married    Spouse name: Not on file   Number of children: 3   Years of education: Not on file   Highest education level: Not on file  Occupational History    Comment: Retired  Tobacco Use   Smoking status: Former    Types: Cigarettes    Quit date: 12/14/1969    Years since quitting: 52.4   Smokeless tobacco: Never  Vaping Use   Vaping Use: Never used  Substance and Sexual Activity   Alcohol use: Yes    Alcohol/week: 2.0 standard drinks of alcohol    Types: 2 Cans of beer per week    Comment: social   Drug use: Never   Sexual activity: Not on file  Other Topics Concern   Not on file  Social History Narrative   Not on file   Social Determinants of Health   Financial Resource Strain: Not on file  Food Insecurity: Not on file  Transportation Needs: Not on file  Physical Activity: Not on file  Stress: Not on file  Social Connections: Not on file   Allergies  Allergen  Reactions   Bee Venom Anaphylaxis   Moxifloxacin Shortness Of Breath   Family History  Problem Relation Age of Onset   Stroke Mother    Arthritis Mother    Cancer Mother        unknown    Dementia Mother    Arthritis Father    Mesothelioma Father    Arthritis Sister    Arthritis Sister    Bipolar disorder Sister      Current Outpatient Medications (Cardiovascular):    ezetimibe (ZETIA) 10 MG tablet, TAKE 1 TABLET BY MOUTH EVERY DAY   ezetimibe (ZETIA) 10 MG tablet, Take 1 tablet by mouth once a day.   losartan (COZAAR) 50 MG tablet, Take by mouth.   losartan (COZAAR) 50 MG tablet, TAKE ONE TABLET BY MOUTH ONCE DAILY AT BEDTIME   rosuvastatin (CRESTOR) 10 MG tablet, TAKE 1 TABLET BY MOUTH EVERY DAY   sildenafil (REVATIO) 20 MG tablet, Take 1-5 tablets by mouth 30 minutes-4 hours prior to sexual activity as needed as directed   sildenafil (REVATIO) 20 MG  tablet, Take 1 to 5 tablets by mouth as needed 30 minutes-4 hours prior to sexual activity as directed   sildenafil (REVATIO) 20 MG tablet, Take 1-5 tablets by mouth as needed 30 minutes-4 hours prior to sexual activity.     Current Outpatient Medications (Other):    Barberry-Oreg Grape-Goldenseal (BERBERINE COMPLEX PO), Take by mouth.   CHELATED MAGNESIUM PO, Take 2 tablets by mouth in the morning and at bedtime.    Cholecalciferol (VITAMIN D3) 1.25 MG (50000 UT) CAPS, Take by mouth.   Coenzyme Q10 (CO Q 10) 100 MG CAPS, Take 100 mg by mouth in the morning and at bedtime.    erythromycin ophthalmic ointment, Apply ointment to upper eyelids in the morning and at bedtime for 2 more weeks   escitalopram (LEXAPRO) 10 MG tablet, Take 10 mg by mouth daily.    escitalopram (LEXAPRO) 10 MG tablet, TAKE 1 TABLET BY MOUTH EVERY DAY   Investigational omega-3-fatty acid/placebo capsule S0927*, Take 4 capsules by mouth 2 (two) times daily. Take with food.   Multiple Vitamin (MULTI-VITAMINS) TABS, Take 3 tablets by mouth in the morning and at bedtime.    nirmatrelvir/ritonavir EUA (PAXLOVID) TABS, Take 3 tablets by mouth 2 (two) times daily.   omeprazole (PRILOSEC) 40 MG capsule, Take 1 capsule by mouth daily   Probiotic Product (PROBIOTIC PO), Take 1 capsule by mouth daily.   Saw Palmetto, Serenoa repens, (SAW PALMETTO PO), Take 2 capsules by mouth in the morning and at bedtime. * These medications belong to multiple therapeutic classes and are listed under each applicable group.   Reviewed prior external information including notes and imaging from  primary care provider As well as notes that were available from care everywhere and other healthcare systems.  Past medical history, social, surgical and family history all reviewed in electronic medical record.  Barnett pertanent information unless stated regarding to the chief complaint.   Review of Systems:  Barnett headache, visual changes, nausea, vomiting,  diarrhea, constipation, dizziness, abdominal pain, skin rash, fevers, chills, night sweats, weight loss, swollen lymph nodes, body aches, joint swelling, chest pain, shortness of breath, mood changes. POSITIVE muscle aches  Objective  Blood pressure 118/76, pulse 68, height '6\' 1"'$  (1.854 m), weight 216 lb (98 kg), SpO2 97 %.   General: Barnett apparent distress alert and oriented x3 mood and affect normal, dressed appropriately.  HEENT: Pupils equal, extraocular movements intact  Respiratory: Patient's  speak in full sentences and does not appear short of breath  Cardiovascular: Barnett lower extremity edema, non tender, Barnett erythema  Left hip exam shows the patient is tender to palpation mostly over the posterior aspect of the greater trochanteric area.  Patient does have mild tightness with FABER test noted as well. Low back exam does have some loss of lordosis.  Significant tightness noted in the piriformis as well.  Procedure: Real-time Ultrasound Guided Injection of left posterior greater trochanteric area at the insertion of the piriformis Device: GE Logiq Q7 Ultrasound guided injection is preferred based studies that show increased duration, increased effect, greater accuracy, decreased procedural pain, increased response rate, and decreased cost with ultrasound guided versus blind injection.  Verbal informed consent obtained.  Time-out conducted.  Noted Barnett overlying erythema, induration, or other signs of local infection.  Skin prepped in a sterile fashion.  Local anesthesia: Topical Ethyl chloride.  With sterile technique and under real time ultrasound guidance: With a 21-gauge 2 inch needle patient was injected with 2 cc of 0.5% Marcaine and 1 cc of Kenalog 40 mg per mill Completed without difficulty  Pain immediately improved suggesting accurate placement of the medication.  Advised to call if fevers/chills, erythema, induration, drainage, or persistent bleeding.  Impression: Technically  successful ultrasound guided injection.     Impression and Recommendations:    The above documentation has been reviewed and is accurate and complete Lyndal Pulley, DO

## 2022-05-29 ENCOUNTER — Ambulatory Visit: Payer: Medicare Other

## 2022-05-29 ENCOUNTER — Ambulatory Visit (INDEPENDENT_AMBULATORY_CARE_PROVIDER_SITE_OTHER): Payer: Medicare Other | Admitting: Family Medicine

## 2022-05-29 ENCOUNTER — Encounter: Payer: Self-pay | Admitting: Family Medicine

## 2022-05-29 VITALS — BP 118/76 | HR 68 | Ht 73.0 in | Wt 216.0 lb

## 2022-05-29 DIAGNOSIS — G5702 Lesion of sciatic nerve, left lower limb: Secondary | ICD-10-CM | POA: Diagnosis not present

## 2022-05-29 DIAGNOSIS — M25552 Pain in left hip: Secondary | ICD-10-CM

## 2022-05-29 NOTE — Assessment & Plan Note (Addendum)
Patient given injection and tolerated the procedure well, discussed icing regimen and home exercises, discussed which activities to do and which ones to avoid.  Increase activity slowly otherwise.  Follow-up again in 6 to 8 weeks. Discussed if worsening need xrays

## 2022-05-29 NOTE — Patient Instructions (Addendum)
L GT injection today Xray on another day See me again in 5-6 weeks

## 2022-05-30 ENCOUNTER — Other Ambulatory Visit (HOSPITAL_COMMUNITY): Payer: Self-pay

## 2022-05-30 MED FILL — Rosuvastatin Calcium Tab 10 MG: ORAL | 90 days supply | Qty: 90 | Fill #0 | Status: AC

## 2022-06-17 DIAGNOSIS — H18512 Endothelial corneal dystrophy, left eye: Secondary | ICD-10-CM | POA: Diagnosis not present

## 2022-06-17 DIAGNOSIS — Z961 Presence of intraocular lens: Secondary | ICD-10-CM | POA: Diagnosis not present

## 2022-06-17 DIAGNOSIS — H2512 Age-related nuclear cataract, left eye: Secondary | ICD-10-CM | POA: Diagnosis not present

## 2022-06-17 DIAGNOSIS — Z947 Corneal transplant status: Secondary | ICD-10-CM | POA: Diagnosis not present

## 2022-06-26 NOTE — Progress Notes (Unsigned)
Cory Barnett [place] [address] [place] Phone: [phone] Subjective:   Cory Barnett, am serving as a scribe for Dr. Hulan [name].  I'm seeing this patient by the request  of:  Crist Infante, MD  CC: left hip pain follow up   XFG:[id number]  05/29/2022 Patient given injection and tolerated the procedure well, discussed icing regimen and home exercises, discussed which activities to do and which ones to avoid.  Increase activity slowly otherwise.  Follow-up again in 6 to 8 weeks. Discussed if worsening need xrays   Update 06/29/2022 Cory Barnett is a 76 y.o. male coming in with complaint of L hip pain.  Patient was found to have more of a piriformis syndrome.  Given injection 1 month ago and home exercises.  Patient states  doing much better. Injection did help. No new issues.   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   IFG (impaired fasting glucose) 01/2016   Pre-diabetes    RBBB (right bundle branch block)    Rheumatic fever    age 76 or 37, in hospital for a month, out of school for a yr   Seasonal allergies    Squamous cell carcinoma    Testicular hypofunction    Past Surgical History:  Procedure Laterality Date   EYE SURGERY     right eye  cataract surgery with lens implant   NASAL SEPTUM SURGERY     NOSE SURGERY     REVERSE SHOULDER ARTHROPLASTY Right 01/11/2020   Procedure: REVERSE SHOULDER ARTHROPLASTY;  Surgeon: Tania Ade, MD;  Location: [facility];  Service: Orthopedics;  Laterality: Right;   SHOULDER ARTHROSCOPY WITH SUBACROMIAL DECOMPRESSION Right 08/07/2019    Procedure: SHOULDER ARTHROSCOPY WITH SUBACROMIAL DECOMPRESSION;  Surgeon: Tania Ade, MD;  Location: [facility];  Service: Orthopedics;  Laterality: Right;   SKIN CANCER EXCISION     basal and squamous cell    TONSILLECTOMY     VASECTOMY     WISDOM TOOTH EXTRACTION     Social History   Socioeconomic History   Marital status: Married    Spouse name: Not on file   Number of children: 3   Years of education: Not on file   Highest education level: Not on file  Occupational History    Comment: Retired  Tobacco Use   Smoking status: Former    Types: Cigarettes    Quit date: 12/14/1969    Years since quitting: 52.5   Smokeless tobacco: Never  Vaping Use   Vaping Use: Never used  Substance and Sexual Activity   Alcohol use: Yes    Alcohol/week: 2.0 standard drinks of alcohol    Types: 2 Cans of beer per week    Comment: social   Drug use: Never   Sexual activity: Not on file  Other Topics Concern   Not on file  Social History Narrative   Not on file   Social Determinants of Health   Financial Resource Strain: Not on file  Food Insecurity: Not on file  Transportation Needs: Not on file  Physical Activity: Not on file  Stress: Not on file  Social Connections: Not on file   Allergies  Allergen Reactions   Bee Venom Anaphylaxis   Moxifloxacin Shortness Of Breath   Family History  Problem Relation Age of Onset   Stroke Mother    Arthritis Mother    Cancer Mother        unknown    Dementia Mother    Arthritis Father    Mesothelioma Father    Arthritis Sister    Arthritis Sister    Bipolar disorder Sister      Current Outpatient Medications (Cardiovascular):    ezetimibe (ZETIA) 10 MG tablet, TAKE 1 TABLET BY MOUTH EVERY DAY   ezetimibe (ZETIA) 10 MG tablet, Take 1 tablet by mouth once a day.   losartan (COZAAR) 50 MG tablet, Take by mouth.   losartan (COZAAR) 50 MG tablet, TAKE ONE TABLET BY MOUTH ONCE DAILY AT BEDTIME   rosuvastatin  (CRESTOR) 10 MG tablet, TAKE 1 TABLET BY MOUTH EVERY DAY   sildenafil (REVATIO) 20 MG tablet, Take 1-5 tablets by mouth 30 minutes-4 hours prior to sexual activity as needed as directed   sildenafil (REVATIO) 20 MG tablet, Take 1 to 5 tablets by mouth as needed 30 minutes-4 hours prior to sexual activity as directed   sildenafil (REVATIO) 20 MG tablet, Take 1-5 tablets by mouth as needed 30 minutes-4 hours prior to sexual activity.     Current Outpatient Medications (Other):    Barberry-Oreg Grape-Goldenseal (BERBERINE COMPLEX PO), Take by mouth.   CHELATED MAGNESIUM PO, Take 2 tablets by mouth in the morning and at bedtime.    Cholecalciferol (VITAMIN D3) 1.25 MG (50000 UT) CAPS, Take by mouth.   Coenzyme Q10 (CO Q 10) 100 MG CAPS, Take 100 mg by mouth in the morning and at bedtime.    erythromycin ophthalmic ointment, Apply ointment to upper eyelids in the morning and at bedtime for 2 more weeks   escitalopram (LEXAPRO) 10 MG tablet, Take 10 mg by mouth daily.    escitalopram (LEXAPRO) 10 MG tablet, TAKE 1 TABLET BY MOUTH EVERY DAY   Investigational omega-3-fatty acid/placebo capsule S0927*, Take 4 capsules by mouth 2 (two) times daily. Take with food.   Multiple Vitamin (MULTI-VITAMINS) TABS, Take 3 tablets by mouth in the morning and at bedtime.    nirmatrelvir/ritonavir EUA (PAXLOVID) TABS, Take 3 tablets by mouth 2 (two) times daily.   omeprazole (PRILOSEC) 40 MG capsule, Take 1 capsule by mouth daily   Probiotic Product (PROBIOTIC PO), Take 1 capsule by mouth daily.   Saw Palmetto, Serenoa repens, (SAW PALMETTO PO), Take 2 capsules by mouth in the morning and at bedtime. * These medications belong to multiple therapeutic classes and are listed under each applicable group.    Review of Systems:  No headache, visual changes, nausea, vomiting, diarrhea, constipation, dizziness, abdominal pain, skin rash, fevers, chills, night sweats, weight loss, swollen lymph nodes, body aches, joint  swelling, chest pain, shortness of breath, mood changes. POSITIVE muscle aches  Objective  Blood pressure 122/68, pulse 94, height '6\' 1"'$  (1.854 m), weight 216 lb (98 kg), SpO2 (!) 78 %.   General: No apparent distress alert and oriented x3 mood and affect normal, dressed appropriately.  HEENT: Pupils equal, extraocular movements intact  Respiratory: Patient's speak in full sentences and does not appear short of breath  Cardiovascular: No lower extremity edema, non tender, no  erythema  Low back exam still has loss of lordosis.  Some degenerative scoliosis noted.  Patient will have significant improvement in the FABER test on the left side with less pain.  Patient still has some mild tightness noted with straight leg testing on the left compared to the right but no radicular symptoms.    Impression and Recommendations:    The above documentation has been reviewed and is accurate and complete Lyndal Pulley, DO

## 2022-06-29 ENCOUNTER — Encounter: Payer: Self-pay | Admitting: Family Medicine

## 2022-06-29 ENCOUNTER — Ambulatory Visit (INDEPENDENT_AMBULATORY_CARE_PROVIDER_SITE_OTHER): Payer: Medicare Other | Admitting: Family Medicine

## 2022-06-29 DIAGNOSIS — G5702 Lesion of sciatic nerve, left lower limb: Secondary | ICD-10-CM | POA: Diagnosis not present

## 2022-06-29 NOTE — Assessment & Plan Note (Signed)
Significant improvement at this time.  Patient is feeling 80% better.  No significant changes otherwise.  Follow-up with me as needed

## 2022-07-01 DIAGNOSIS — C44319 Basal cell carcinoma of skin of other parts of face: Secondary | ICD-10-CM | POA: Diagnosis not present

## 2022-07-01 DIAGNOSIS — L570 Actinic keratosis: Secondary | ICD-10-CM | POA: Diagnosis not present

## 2022-07-03 DIAGNOSIS — R7301 Impaired fasting glucose: Secondary | ICD-10-CM | POA: Diagnosis not present

## 2022-07-03 DIAGNOSIS — Z125 Encounter for screening for malignant neoplasm of prostate: Secondary | ICD-10-CM | POA: Diagnosis not present

## 2022-07-03 DIAGNOSIS — E291 Testicular hypofunction: Secondary | ICD-10-CM | POA: Diagnosis not present

## 2022-07-03 DIAGNOSIS — R7989 Other specified abnormal findings of blood chemistry: Secondary | ICD-10-CM | POA: Diagnosis not present

## 2022-07-03 DIAGNOSIS — F419 Anxiety disorder, unspecified: Secondary | ICD-10-CM | POA: Diagnosis not present

## 2022-07-03 DIAGNOSIS — E785 Hyperlipidemia, unspecified: Secondary | ICD-10-CM | POA: Diagnosis not present

## 2022-07-03 DIAGNOSIS — I10 Essential (primary) hypertension: Secondary | ICD-10-CM | POA: Diagnosis not present

## 2022-07-03 DIAGNOSIS — Z0000 Encounter for general adult medical examination without abnormal findings: Secondary | ICD-10-CM | POA: Diagnosis not present

## 2022-07-13 ENCOUNTER — Other Ambulatory Visit (HOSPITAL_COMMUNITY): Payer: Self-pay

## 2022-07-13 DIAGNOSIS — Z0000 Encounter for general adult medical examination without abnormal findings: Secondary | ICD-10-CM | POA: Diagnosis not present

## 2022-07-13 DIAGNOSIS — R82998 Other abnormal findings in urine: Secondary | ICD-10-CM | POA: Diagnosis not present

## 2022-07-13 DIAGNOSIS — I700 Atherosclerosis of aorta: Secondary | ICD-10-CM | POA: Diagnosis not present

## 2022-07-13 DIAGNOSIS — R7301 Impaired fasting glucose: Secondary | ICD-10-CM | POA: Diagnosis not present

## 2022-07-13 DIAGNOSIS — Z1331 Encounter for screening for depression: Secondary | ICD-10-CM | POA: Diagnosis not present

## 2022-07-13 DIAGNOSIS — E291 Testicular hypofunction: Secondary | ICD-10-CM | POA: Diagnosis not present

## 2022-07-13 DIAGNOSIS — I4510 Unspecified right bundle-branch block: Secondary | ICD-10-CM | POA: Diagnosis not present

## 2022-07-13 DIAGNOSIS — Z96611 Presence of right artificial shoulder joint: Secondary | ICD-10-CM | POA: Diagnosis not present

## 2022-07-13 DIAGNOSIS — I2510 Atherosclerotic heart disease of native coronary artery without angina pectoris: Secondary | ICD-10-CM | POA: Diagnosis not present

## 2022-07-13 DIAGNOSIS — R972 Elevated prostate specific antigen [PSA]: Secondary | ICD-10-CM | POA: Diagnosis not present

## 2022-07-13 DIAGNOSIS — I10 Essential (primary) hypertension: Secondary | ICD-10-CM | POA: Diagnosis not present

## 2022-07-13 DIAGNOSIS — I440 Atrioventricular block, first degree: Secondary | ICD-10-CM | POA: Diagnosis not present

## 2022-07-13 DIAGNOSIS — E785 Hyperlipidemia, unspecified: Secondary | ICD-10-CM | POA: Diagnosis not present

## 2022-07-13 DIAGNOSIS — Z1339 Encounter for screening examination for other mental health and behavioral disorders: Secondary | ICD-10-CM | POA: Diagnosis not present

## 2022-07-13 DIAGNOSIS — M13869 Other specified arthritis, unspecified knee: Secondary | ICD-10-CM | POA: Diagnosis not present

## 2022-07-13 MED ORDER — SILDENAFIL CITRATE 20 MG PO TABS
20.0000 mg | ORAL_TABLET | ORAL | 11 refills | Status: DC | PRN
  Filled 2022-07-13: qty 50, 10d supply, fill #0

## 2022-07-13 MED ORDER — LOSARTAN POTASSIUM 50 MG PO TABS
50.0000 mg | ORAL_TABLET | Freq: Every day | ORAL | 3 refills | Status: DC
  Filled 2022-07-13: qty 90, 90d supply, fill #0
  Filled 2022-10-14: qty 90, 90d supply, fill #1
  Filled 2023-02-15: qty 90, 90d supply, fill #2
  Filled 2023-04-28: qty 90, 90d supply, fill #3

## 2022-07-13 MED ORDER — ESCITALOPRAM OXALATE 10 MG PO TABS
10.0000 mg | ORAL_TABLET | Freq: Every day | ORAL | 3 refills | Status: DC
  Filled 2022-07-13: qty 90, 90d supply, fill #0
  Filled 2022-10-14: qty 90, 90d supply, fill #1
  Filled 2023-01-23: qty 90, 90d supply, fill #2
  Filled 2023-04-28: qty 90, 90d supply, fill #3

## 2022-07-13 MED ORDER — EZETIMIBE 10 MG PO TABS
10.0000 mg | ORAL_TABLET | Freq: Every day | ORAL | 3 refills | Status: DC
  Filled 2022-07-13: qty 90, 90d supply, fill #0
  Filled 2022-10-14: qty 90, 90d supply, fill #1
  Filled 2023-02-01: qty 90, 90d supply, fill #2
  Filled 2023-04-28: qty 90, 90d supply, fill #3

## 2022-07-13 MED ORDER — ROSUVASTATIN CALCIUM 20 MG PO TABS
20.0000 mg | ORAL_TABLET | Freq: Every day | ORAL | 3 refills | Status: DC
  Filled 2022-07-13: qty 90, 90d supply, fill #0
  Filled 2022-10-14: qty 90, 90d supply, fill #1

## 2022-07-14 ENCOUNTER — Other Ambulatory Visit (HOSPITAL_COMMUNITY): Payer: Self-pay

## 2022-07-21 ENCOUNTER — Other Ambulatory Visit (HOSPITAL_COMMUNITY): Payer: Self-pay

## 2022-08-05 DIAGNOSIS — C44319 Basal cell carcinoma of skin of other parts of face: Secondary | ICD-10-CM | POA: Diagnosis not present

## 2022-09-05 ENCOUNTER — Other Ambulatory Visit (HOSPITAL_COMMUNITY): Payer: Self-pay

## 2022-09-05 ENCOUNTER — Other Ambulatory Visit (HOSPITAL_BASED_OUTPATIENT_CLINIC_OR_DEPARTMENT_OTHER): Payer: Self-pay

## 2022-09-05 DIAGNOSIS — Z23 Encounter for immunization: Secondary | ICD-10-CM | POA: Diagnosis not present

## 2022-09-05 MED ORDER — FLUAD QUADRIVALENT 0.5 ML IM PRSY
PREFILLED_SYRINGE | INTRAMUSCULAR | 0 refills | Status: DC
  Filled 2022-09-05: qty 0.5, 1d supply, fill #0

## 2022-09-05 MED ORDER — INFLUENZA VAC A&B SA ADJ QUAD 0.5 ML IM PRSY
PREFILLED_SYRINGE | INTRAMUSCULAR | 0 refills | Status: DC
  Filled 2022-09-05: qty 0.5, 1d supply, fill #0

## 2022-09-07 ENCOUNTER — Other Ambulatory Visit (HOSPITAL_BASED_OUTPATIENT_CLINIC_OR_DEPARTMENT_OTHER): Payer: Self-pay

## 2022-09-08 ENCOUNTER — Other Ambulatory Visit (HOSPITAL_COMMUNITY): Payer: Self-pay

## 2022-09-12 DIAGNOSIS — Z23 Encounter for immunization: Secondary | ICD-10-CM | POA: Diagnosis not present

## 2022-10-14 ENCOUNTER — Other Ambulatory Visit (HOSPITAL_COMMUNITY): Payer: Self-pay

## 2022-10-14 ENCOUNTER — Other Ambulatory Visit: Payer: Self-pay

## 2022-10-15 ENCOUNTER — Other Ambulatory Visit (HOSPITAL_COMMUNITY): Payer: Self-pay

## 2022-10-15 MED ORDER — AMOXICILLIN 500 MG PO CAPS
2000.0000 mg | ORAL_CAPSULE | ORAL | 2 refills | Status: AC
  Filled 2022-10-15 – 2023-02-15 (×2): qty 8, 2d supply, fill #0
  Filled 2023-04-28: qty 8, 2d supply, fill #1
  Filled 2023-08-22: qty 8, 2d supply, fill #2

## 2022-10-27 ENCOUNTER — Other Ambulatory Visit (HOSPITAL_COMMUNITY): Payer: Self-pay

## 2022-11-03 NOTE — Progress Notes (Signed)
Cory Barnett Phone: [phone] Subjective:    I'm seeing this patient by the request  of:  Crist Infante, MD  CC: Bilateral knee pain  YIR:[id number]  06/29/2022 Significant improvement at this time.  Patient is feeling 80% better.  No significant changes otherwise.  Follow-up with me as needed      Update 11/06/2022 Cory Barnett is a 77 y.o. male coming in with complaint of B knee pain.  Was seen previously 2 years ago for the knee on the left side and had a loose body with mild arthritis.  Seen recently for [place] for more of a piriformis syndrome on the left side in August.  Patient states  left knee when he goes up steps he has pain that is tolerable but there is pain. Right knee is not as bad. Going down steps is fine. Patient went to an [facility]oped[facility]  4 years ago at [facility] and was told that his cartilage was getting thinner and that he was not ready for surgery then, patient is not wanting to get surgery yet since wife is having cancer treatments. But would like the knees x ray'd and see if there is anything we can help with or if he needs to go back to an orthopedist. Right thumb is having some arthritic pain as well.       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   IFG (impaired fasting glucose) 01/2016   Pre-diabetes    RBBB (right bundle branch block)    Rheumatic fever    age 32 or 31, in hospital for a month, out of school for a yr   Seasonal allergies    Squamous cell carcinoma    Testicular hypofunction    Past Surgical History:  Procedure Laterality Date   EYE SURGERY     right eye  cataract surgery with lens implant   NASAL SEPTUM SURGERY     NOSE SURGERY     REVERSE SHOULDER ARTHROPLASTY  Right 01/11/2020   Procedure: REVERSE SHOULDER ARTHROPLASTY;  Surgeon: Tania Ade, MD;  Location: [facility];  Service: Orthopedics;  Laterality: Right;   SHOULDER ARTHROSCOPY WITH SUBACROMIAL DECOMPRESSION Right 08/07/2019   Procedure: SHOULDER ARTHROSCOPY WITH SUBACROMIAL DECOMPRESSION;  Surgeon: Tania Ade, MD;  Location: [place] [facility];  Service: Orthopedics;  Laterality: Right;   SKIN CANCER EXCISION     basal and squamous cell    TONSILLECTOMY     VASECTOMY     WISDOM TOOTH EXTRACTION     Social History   Socioeconomic History   Marital status: Married    Spouse name: Not on file   Number of children: 3   Years of education: Not on file   Highest education level: Not on file  Occupational History    Comment: Retired  Tobacco Use   Smoking status: Former    Types: Cigarettes    Quit date: 12/14/1969    Years since quitting: 52.9   Smokeless tobacco: Never  Vaping Use   Vaping Use: Never used  Substance and Sexual Activity   Alcohol use: Yes    Alcohol/week: 2.0 standard drinks of alcohol  Types: 2 Cans of beer per week    Comment: social   Drug use: Never   Sexual activity: Not on file  Other Topics Concern   Not on file  Social History Narrative   Not on file   Social Determinants of Health   Financial Resource Strain: Not on file  Food Insecurity: Not on file  Transportation Needs: Not on file  Physical Activity: Not on file  Stress: Not on file  Social Connections: Not on file   Allergies  Allergen Reactions   Bee Venom Anaphylaxis   Moxifloxacin Shortness Of Breath   Family History  Problem Relation Age of Onset   Stroke Mother    Arthritis Mother    Cancer Mother        unknown    Dementia Mother    Arthritis Father    Mesothelioma Father    Arthritis Sister    Arthritis Sister    Bipolar disorder Sister      Current Outpatient Medications (Cardiovascular):    ezetimibe (ZETIA) 10 MG tablet, Take 1 tablet (10 mg total)  by mouth daily.   losartan (COZAAR) 50 MG tablet, Take 1 tablet (50 mg total) by mouth at bedtime.   rosuvastatin (CRESTOR) 10 MG tablet, TAKE 1 TABLET BY MOUTH EVERY DAY   sildenafil (REVATIO) 20 MG tablet, Take 1-5 tablets (20-100 mg total) by mouth 30 minutes-4 hours prior to sexual activity as needed.     Current Outpatient Medications (Other):    Barberry-Oreg Grape-Goldenseal (BERBERINE COMPLEX PO), Take by mouth.   CHELATED MAGNESIUM PO, Take 2 tablets by mouth in the morning and at bedtime.    Cholecalciferol (VITAMIN D3) 1.25 MG (50000 UT) CAPS, Take by mouth.   Coenzyme Q10 (CO Q 10) 100 MG CAPS, Take 100 mg by mouth in the morning and at bedtime.    erythromycin ophthalmic ointment, Apply ointment to upper eyelids in the morning and at bedtime for 2 more weeks   escitalopram (LEXAPRO) 10 MG tablet, Take 1 tablet (10 mg total) by mouth daily.   Investigational omega-3-fatty acid/placebo capsule S0927*, Take 4 capsules by mouth 2 (two) times daily. Take with food.   Multiple Vitamin (MULTI-VITAMINS) TABS, Take 3 tablets by mouth in the morning and at bedtime.    nirmatrelvir/ritonavir EUA (PAXLOVID) TABS, Take 3 tablets by mouth 2 (two) times daily.   Probiotic Product (PROBIOTIC PO), Take 1 capsule by mouth daily.   Saw Palmetto, Serenoa repens, (SAW PALMETTO PO), Take 2 capsules by mouth in the morning and at bedtime.   amoxicillin (AMOXIL) 500 MG capsule, Take 4 capsules (2,000 mg total) by mouth 1 hour prior to dental treatment (Patient not taking: Reported on 11/06/2022)   omeprazole (PRILOSEC) 40 MG capsule, Take 1 capsule by mouth daily (Patient not taking: Reported on 11/06/2022) * These medications belong to multiple therapeutic classes and are listed under each applicable group.   Reviewed prior external information including notes and imaging from  primary care provider As well as notes that were available from care everywhere and other healthcare systems.  Past medical  history, social, surgical and family history all reviewed in electronic medical record.  No pertanent information unless stated regarding to the chief complaint.   Review of Systems:  No headache, visual changes, nausea, vomiting, diarrhea, constipation, dizziness, abdominal pain, skin rash, fevers, chills, night sweats, weight loss, swollen lymph nodes, body aches, chest pain, shortness of breath, mood changes. POSITIVE muscle aches, swelling  Objective  Blood  pressure 122/82, pulse 68, height '6\' 1"'$  (1.854 m), weight 220 lb (99.8 kg), SpO2 98 %.   General: No apparent distress alert and oriented x3 mood and affect normal, dressed appropriately.  HEENT: Pupils equal, extraocular movements intact  Respiratory: Patient's speak in full sentences and does not appear short of breath  Cardiovascular: No lower extremity edema, non tender, no erythema  Patient does have an antalgic gait noted.  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  Significant crepitus of the knees bilaterally.  Lateral tracking of the patella left greater than right.  Limited muscular skeletal ultrasound was performed and interpreted by Hulan [name], M  Limited ultrasound shows the patient does have significant narrowing of the patellofemoral joint bilaterally left greater than right.  Trace effusion on the left side noted.  Some degenerative changes of the meniscus but no significant loose body and associated today.  After informed written and verbal consent, patient was seated on exam table. Left knee was prepped with alcohol swab and utilizing anterolateral approach, patient's left knee space was injected with 4:1  marcaine 0.5%: Kenalog '40mg'$ /dL. Patient tolerated the procedure well without immediate complications.  After informed written and verbal consent, patient was seated on exam table. Right knee was prepped with alcohol swab and utilizing anterolateral approach, patient's right knee space was injected with 4:1   marcaine 0.5%: Kenalog '40mg'$ /dL. Patient tolerated the procedure well without immediate complications.    Impression and Recommendations:    The above documentation has been reviewed and is accurate and complete Lyndal Pulley, DO

## 2022-11-06 ENCOUNTER — Other Ambulatory Visit: Payer: Self-pay | Admitting: Family Medicine

## 2022-11-06 ENCOUNTER — Ambulatory Visit (INDEPENDENT_AMBULATORY_CARE_PROVIDER_SITE_OTHER): Payer: Medicare Other | Admitting: Family Medicine

## 2022-11-06 ENCOUNTER — Ambulatory Visit: Payer: Self-pay

## 2022-11-06 ENCOUNTER — Ambulatory Visit (INDEPENDENT_AMBULATORY_CARE_PROVIDER_SITE_OTHER): Payer: Medicare Other

## 2022-11-06 VITALS — BP 122/82 | HR 68 | Ht 73.0 in | Wt 220.0 lb

## 2022-11-06 DIAGNOSIS — M1712 Unilateral primary osteoarthritis, left knee: Secondary | ICD-10-CM | POA: Diagnosis not present

## 2022-11-06 DIAGNOSIS — M25561 Pain in right knee: Secondary | ICD-10-CM

## 2022-11-06 DIAGNOSIS — M170 Bilateral primary osteoarthritis of knee: Secondary | ICD-10-CM

## 2022-11-06 DIAGNOSIS — M25562 Pain in left knee: Secondary | ICD-10-CM

## 2022-11-06 DIAGNOSIS — M1711 Unilateral primary osteoarthritis, right knee: Secondary | ICD-10-CM | POA: Diagnosis not present

## 2022-11-06 NOTE — Patient Instructions (Addendum)
Good to see you  Get x rays on the way out  DRONAVINOL Injected knees today See me in 6 weeks

## 2022-11-08 ENCOUNTER — Encounter: Payer: Self-pay | Admitting: Family Medicine

## 2022-11-08 DIAGNOSIS — M170 Bilateral primary osteoarthritis of knee: Secondary | ICD-10-CM | POA: Insufficient documentation

## 2022-11-08 NOTE — Assessment & Plan Note (Signed)
Chronic problem with exacerbation, patient at this moment social determinants of health is that he is the primary caregiver for his wife who is undergoing cancer treatment.  Unable to do any surgical procedures at this time.  Discussed with patient about icing regimen and home exercises.  We discussed that previous x-rays have shown loose bodies and likely will show some progression but we will get new x-rays to further evaluate.  Bilateral injections given today as well to tolerate increasing activity.  Discussed formal physical therapy but again secondary to him being the primary caregiver at the moment wants to hold on anything that might take him away from his primary duties at the moment.  Follow-up with me again in 6 weeks and patient could be potentially candidate for viscosupplementation

## 2022-11-16 ENCOUNTER — Other Ambulatory Visit (HOSPITAL_COMMUNITY): Payer: Self-pay

## 2022-11-16 MED ORDER — AMOXICILLIN 500 MG PO CAPS
2000.0000 mg | ORAL_CAPSULE | ORAL | 0 refills | Status: DC
  Filled 2022-11-16: qty 8, 2d supply, fill #0

## 2022-11-19 DIAGNOSIS — L821 Other seborrheic keratosis: Secondary | ICD-10-CM | POA: Diagnosis not present

## 2022-11-19 DIAGNOSIS — L578 Other skin changes due to chronic exposure to nonionizing radiation: Secondary | ICD-10-CM | POA: Diagnosis not present

## 2022-11-19 DIAGNOSIS — D225 Melanocytic nevi of trunk: Secondary | ICD-10-CM | POA: Diagnosis not present

## 2022-11-19 DIAGNOSIS — D485 Neoplasm of uncertain behavior of skin: Secondary | ICD-10-CM | POA: Diagnosis not present

## 2022-11-19 DIAGNOSIS — L570 Actinic keratosis: Secondary | ICD-10-CM | POA: Diagnosis not present

## 2022-11-19 DIAGNOSIS — C44319 Basal cell carcinoma of skin of other parts of face: Secondary | ICD-10-CM | POA: Diagnosis not present

## 2022-11-19 DIAGNOSIS — Z85828 Personal history of other malignant neoplasm of skin: Secondary | ICD-10-CM | POA: Diagnosis not present

## 2022-12-15 DIAGNOSIS — E291 Testicular hypofunction: Secondary | ICD-10-CM | POA: Diagnosis not present

## 2022-12-15 DIAGNOSIS — Z0000 Encounter for general adult medical examination without abnormal findings: Secondary | ICD-10-CM | POA: Diagnosis not present

## 2022-12-17 DIAGNOSIS — L820 Inflamed seborrheic keratosis: Secondary | ICD-10-CM | POA: Diagnosis not present

## 2022-12-17 DIAGNOSIS — D485 Neoplasm of uncertain behavior of skin: Secondary | ICD-10-CM | POA: Diagnosis not present

## 2022-12-17 DIAGNOSIS — L821 Other seborrheic keratosis: Secondary | ICD-10-CM | POA: Diagnosis not present

## 2022-12-17 DIAGNOSIS — C44319 Basal cell carcinoma of skin of other parts of face: Secondary | ICD-10-CM | POA: Diagnosis not present

## 2022-12-17 NOTE — Progress Notes (Signed)
[facility] [address] [place] Phone: [phone] Subjective:   [name], am serving as a scribe for Dr. Hulan [name].  I'm seeing this patient by the request  of:  Crist Infante, MD  CC: Bilateral knee pain    [id number]  11/06/2022 Chronic problem with exacerbation, patient at this moment social determinants of health is that he is the primary caregiver for his wife who is undergoing cancer treatment.  Unable to do any surgical procedures at this time.  Discussed with patient about icing regimen and home exercises.  We discussed that previous x-rays have shown loose bodies and likely will show some progression but we will get new x-rays to further evaluate.  Bilateral injections given today as well to tolerate increasing activity.  Discussed formal physical therapy but again secondary to him being the primary caregiver at the moment wants to hold on anything that might take him away from his primary duties at the moment.  Follow-up with me again in 6 weeks and patient could be potentially candidate for viscosupplementation   Update 12/22/2022 Cory Barnett is a 77 y.o. male coming in with complaint of B knee pain. Patient states doing much better since last visit. Knee injection did help, but can tell that it is wearing off. No new concerns.  Still has some discomfort on a daily basis.      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   IFG (impaired fasting glucose) 01/2016   Pre-diabetes    RBBB (right bundle branch block)    Rheumatic fever    age 27 or 41, in hospital for a month, out of school for a yr   Seasonal allergies    Squamous cell carcinoma    Testicular hypofunction    Past Surgical History:   Procedure Laterality Date   EYE SURGERY     right eye  cataract surgery with lens implant   NASAL SEPTUM SURGERY     NOSE SURGERY     REVERSE SHOULDER ARTHROPLASTY Right 01/11/2020   Procedure: REVERSE SHOULDER ARTHROPLASTY;  Surgeon: Tania Ade, MD;  Location: [facility];  Service: Orthopedics;  Laterality: Right;   SHOULDER ARTHROSCOPY WITH SUBACROMIAL DECOMPRESSION Right 08/07/2019   Procedure: SHOULDER ARTHROSCOPY WITH SUBACROMIAL DECOMPRESSION;  Surgeon: Tania Ade, MD;  Location: [facility];  Service: Orthopedics;  Laterality: Right;   SKIN CANCER EXCISION     basal and squamous cell    TONSILLECTOMY     VASECTOMY     WISDOM TOOTH EXTRACTION     Social History   Socioeconomic History   Marital status: Married    Spouse name: Not on file   Number of children: 3   Years of education: Not on file   Highest education level: Not on file  Occupational History    Comment: Retired  Tobacco Use   Smoking status: Former    Types: Cigarettes    Quit date: 12/14/1969    Years since quitting: 53.0   Smokeless tobacco: Never  Vaping Use   Vaping Use: Never used  Substance and Sexual Activity   Alcohol use: Yes    Alcohol/week: 2.0 standard drinks  of alcohol    Types: 2 Cans of beer per week    Comment: social   Drug use: Never   Sexual activity: Not on file  Other Topics Concern   Not on file  Social History Narrative   Not on file   Social Determinants of Health   Financial Resource Strain: Not on file  Food Insecurity: Not on file  Transportation Needs: Not on file  Physical Activity: Not on file  Stress: Not on file  Social Connections: Not on file   Allergies  Allergen Reactions   Bee Venom Anaphylaxis   Moxifloxacin Shortness Of Breath   Family History  Problem Relation Age of Onset   Stroke Mother    Arthritis Mother    Cancer Mother        unknown    Dementia Mother    Arthritis Father    Mesothelioma Father    Arthritis Sister     Arthritis Sister    Bipolar disorder Sister      Current Outpatient Medications (Cardiovascular):    ezetimibe (ZETIA) 10 MG tablet, Take 1 tablet (10 mg total) by mouth daily.   losartan (COZAAR) 50 MG tablet, Take 1 tablet (50 mg total) by mouth at bedtime.   rosuvastatin (CRESTOR) 10 MG tablet, TAKE 1 TABLET BY MOUTH EVERY DAY   sildenafil (REVATIO) 20 MG tablet, Take 1-5 tablets (20-100 mg total) by mouth 30 minutes-4 hours prior to sexual activity as needed.     Current Outpatient Medications (Other):    amoxicillin (AMOXIL) 500 MG capsule, Take 4 capsules (2,000 mg total) by mouth 1 hour prior to dental treatment (Patient not taking: Reported on 11/06/2022)   amoxicillin (AMOXIL) 500 MG capsule, Take 4 capsules by mouth 1 hour prior to dental treatment   Barberry-Oreg Grape-Goldenseal (BERBERINE COMPLEX PO), Take by mouth.   CHELATED MAGNESIUM PO, Take 2 tablets by mouth in the morning and at bedtime.    Cholecalciferol (VITAMIN D3) 1.25 MG (50000 UT) CAPS, Take by mouth.   Coenzyme Q10 (CO Q 10) 100 MG CAPS, Take 100 mg by mouth in the morning and at bedtime.    erythromycin ophthalmic ointment, Apply ointment to upper eyelids in the morning and at bedtime for 2 more weeks   escitalopram (LEXAPRO) 10 MG tablet, Take 1 tablet (10 mg total) by mouth daily.   Investigational omega-3-fatty acid/placebo capsule S0927*, Take 4 capsules by mouth 2 (two) times daily. Take with food.   Multiple Vitamin (MULTI-VITAMINS) TABS, Take 3 tablets by mouth in the morning and at bedtime.    nirmatrelvir/ritonavir EUA (PAXLOVID) TABS, Take 3 tablets by mouth 2 (two) times daily.   omeprazole (PRILOSEC) 40 MG capsule, Take 1 capsule by mouth daily (Patient not taking: Reported on 11/06/2022)   Probiotic Product (PROBIOTIC PO), Take 1 capsule by mouth daily.   Saw Palmetto, Serenoa repens, (SAW PALMETTO PO), Take 2 capsules by mouth in the morning and at bedtime. * These medications belong to multiple  therapeutic classes and are listed under each applicable group.   Reviewed prior external information including notes and imaging from  primary care provider As well as notes that were available from care everywhere and other healthcare systems.  Past medical history, social, surgical and family history all reviewed in electronic medical record.  No pertanent information unless stated regarding to the chief complaint.   Review of Systems:  No headache, visual changes, nausea, vomiting, diarrhea, constipation, dizziness, abdominal pain, skin rash, fevers, chills, night  sweats, weight loss, swollen lymph nodes, body aches, joint swelling, chest pain, shortness of breath, mood changes. POSITIVE muscle aches  Objective  Blood pressure 132/74, pulse 70, height 6' 1"$  (1.854 m), weight 221 lb (100.2 kg), SpO2 98 %.   General: No apparent distress alert and oriented x3 mood and affect normal, dressed appropriately.  HEENT: Pupils equal, extraocular movements intact  Respiratory: Patient's speak in full sentences and does not appear short of breath  Cardiovascular: No lower extremity edema, non tender, no erythema  Antalgic gait noted.  Does have tenderness to palpation in the patellofemoral joint.  He does have lacking the last 5 degrees of flexion.    Impression and Recommendations:    The above documentation has been reviewed and is accurate and complete Lyndal Pulley, DO

## 2022-12-22 ENCOUNTER — Ambulatory Visit (INDEPENDENT_AMBULATORY_CARE_PROVIDER_SITE_OTHER): Payer: Medicare Other | Admitting: Family Medicine

## 2022-12-22 VITALS — BP 132/74 | HR 70 | Ht 73.0 in | Wt 221.0 lb

## 2022-12-22 DIAGNOSIS — M170 Bilateral primary osteoarthritis of knee: Secondary | ICD-10-CM

## 2022-12-22 NOTE — Patient Instructions (Addendum)
Do prescribed exercises at least 3x a week We will get gel approval just in case See you again in 2 months

## 2022-12-22 NOTE — Assessment & Plan Note (Signed)
Known arthritic changes, patient states that it is starting to get some mild increase in discomfort.  Discussed viscosupplementation.  Patient wants to continue with the conservative therapy at this time.  Patient otherwise will continue with the home exercises and working on VMO strengthening.  Declined physical therapy.  Follow-up again in 6 weeks

## 2023-01-04 DIAGNOSIS — L820 Inflamed seborrheic keratosis: Secondary | ICD-10-CM | POA: Diagnosis not present

## 2023-01-12 ENCOUNTER — Other Ambulatory Visit (HOSPITAL_COMMUNITY): Payer: Self-pay

## 2023-01-12 MED ORDER — PREDNISOLONE ACETATE 1 % OP SUSP
1.0000 [drp] | Freq: Three times a day (TID) | OPHTHALMIC | 3 refills | Status: AC
  Filled 2023-01-12: qty 5, 34d supply, fill #0
  Filled 2023-02-15: qty 5, 34d supply, fill #1
  Filled 2023-03-20: qty 5, 34d supply, fill #2

## 2023-01-13 ENCOUNTER — Other Ambulatory Visit (HOSPITAL_COMMUNITY): Payer: Self-pay

## 2023-01-15 ENCOUNTER — Other Ambulatory Visit (HOSPITAL_COMMUNITY): Payer: Self-pay

## 2023-01-15 MED FILL — Rosuvastatin Calcium Tab 10 MG: ORAL | 90 days supply | Qty: 90 | Fill #1 | Status: AC

## 2023-01-23 ENCOUNTER — Other Ambulatory Visit (HOSPITAL_COMMUNITY): Payer: Self-pay

## 2023-02-15 ENCOUNTER — Other Ambulatory Visit (HOSPITAL_COMMUNITY): Payer: Self-pay

## 2023-02-17 ENCOUNTER — Other Ambulatory Visit (HOSPITAL_COMMUNITY): Payer: Self-pay

## 2023-02-17 MED ORDER — TADALAFIL 5 MG PO TABS
5.0000 mg | ORAL_TABLET | Freq: Every day | ORAL | 3 refills | Status: AC
  Filled 2023-02-17: qty 90, 90d supply, fill #0
  Filled 2023-04-28: qty 90, 90d supply, fill #1
  Filled 2023-08-22: qty 90, 90d supply, fill #2

## 2023-02-18 ENCOUNTER — Other Ambulatory Visit (HOSPITAL_COMMUNITY): Payer: Self-pay

## 2023-02-22 NOTE — Progress Notes (Unsigned)
[facility] [address] [place] [address] Phone: [phone] Subjective:   Cory Barnett, am serving as a scribe for Dr. Antoine Barnett.  I'm seeing this patient by the request  of:  Cory Ran, MD  CC: Bilateral hip and knee pain  [id number]  12/22/2022 Known arthritic changes, patient states that it is starting to get some mild increase in discomfort.  Discussed viscosupplementation.  Patient wants to continue with the conservative therapy at this time.  Patient otherwise will continue with the home exercises and working on VMO strengthening.  Declined physical therapy.  Follow-up again in 6 weeks      Update 02/23/2023 Cory Barnett is a 78 y.o. male coming in with complaint of B knee pain. Patient states that his pain is worse than last visit. Stairs increase his pain. L>R.   Also c/o pain over R GT when he lies on his side. Also has pain in L side but right is worse and has been occurring for past year. R leg gives out when he stands from prolonged sitting. No pain with walking or after he takes a few steps. Notes that mattress is fairly hard. Thinking about temperpedic mattress. Pain does radiate down R leg to knee.       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   IFG (impaired fasting glucose) 01/2016   Pre-diabetes    RBBB (right bundle branch block)    Rheumatic fever    age 44 or 33, in hospital for a month, out of school for a yr   Seasonal allergies    Squamous cell carcinoma    Testicular hypofunction    Past Surgical History:  Procedure Laterality Date   EYE SURGERY     right eye  cataract surgery with lens implant   NASAL SEPTUM SURGERY     NOSE SURGERY     REVERSE SHOULDER ARTHROPLASTY Right 01/11/2020    Procedure: REVERSE SHOULDER ARTHROPLASTY;  Surgeon: Jones Broom, MD;  Location: [facility];  Service: Orthopedics;  Laterality: Right;   SHOULDER ARTHROSCOPY WITH SUBACROMIAL DECOMPRESSION Right 08/07/2019   Procedure: SHOULDER ARTHROSCOPY WITH SUBACROMIAL DECOMPRESSION;  Surgeon: Jones Broom, MD;  Location: [facility];  Service: Orthopedics;  Laterality: Right;   SKIN CANCER EXCISION     basal and squamous cell    TONSILLECTOMY     VASECTOMY     WISDOM TOOTH EXTRACTION     Social History   Socioeconomic History   Marital status: Married    Spouse name: Not on file   Number of children: 3   Years of education: Not on file   Highest education level: Not on file  Occupational History    Comment: Retired  Tobacco Use   Smoking status: Former    Types: Cigarettes    Quit date: 12/14/1969    Years since quitting: 53.2   Smokeless tobacco: Never  Vaping Use   Vaping Use: Never used  Substance and Sexual Activity   Alcohol use: Yes    Alcohol/week: 2.0 standard drinks of alcohol    Types: 2 Cans of beer per week    Comment: social   Drug use:  Never   Sexual activity: Not on file  Other Topics Concern   Not on file  Social History Narrative   Not on file   Social Determinants of Health   Financial Resource Strain: Not on file  Food Insecurity: Not on file  Transportation Needs: Not on file  Physical Activity: Not on file  Stress: Not on file  Social Connections: Not on file   Allergies  Allergen Reactions   Bee Venom Anaphylaxis   Moxifloxacin Shortness Of Breath   Family History  Problem Relation Age of Onset   Stroke Mother    Arthritis Mother    Cancer Mother        unknown    Dementia Mother    Arthritis Father    Mesothelioma Father    Arthritis Sister    Arthritis Sister    Bipolar disorder Sister      Current Outpatient Medications (Cardiovascular):    ezetimibe (ZETIA) 10 MG tablet, Take 1 tablet (10 mg total) by mouth daily.    losartan (COZAAR) 50 MG tablet, Take 1 tablet (50 mg total) by mouth at bedtime.   rosuvastatin (CRESTOR) 10 MG tablet, TAKE 1 TABLET BY MOUTH EVERY DAY   sildenafil (REVATIO) 20 MG tablet, Take 1-5 tablets (20-100 mg total) by mouth 30 minutes-4 hours prior to sexual activity as needed.   tadalafil (CIALIS) 5 MG tablet, Take 1 tablet (5 mg total) by mouth daily.     Current Outpatient Medications (Other):    amoxicillin (AMOXIL) 500 MG capsule, Take 4 capsules by mouth 1 hour prior to dental treatment   Barberry-Oreg Grape-Goldenseal (BERBERINE COMPLEX PO), Take by mouth.   CHELATED MAGNESIUM PO, Take 2 tablets by mouth in the morning and at bedtime.    Cholecalciferol (VITAMIN D3) 1.25 MG (50000 UT) CAPS, Take by mouth.   Coenzyme Q10 (CO Q 10) 100 MG CAPS, Take 100 mg by mouth in the morning and at bedtime.    escitalopram (LEXAPRO) 10 MG tablet, Take 1 tablet (10 mg total) by mouth daily.   Investigational omega-3-fatty acid/placebo capsule S0927*, Take 4 capsules by mouth 2 (two) times daily. Take with food.   Multiple Vitamin (MULTI-VITAMINS) TABS, Take 3 tablets by mouth in the morning and at bedtime.    nirmatrelvir/ritonavir EUA (PAXLOVID) TABS, Take 3 tablets by mouth 2 (two) times daily.   prednisoLONE acetate (PRED FORTE) 1 % ophthalmic suspension, Place 1 drop into the right eye 3 (three) times daily.   Probiotic Product (PROBIOTIC PO), Take 1 capsule by mouth daily.   Saw Palmetto, Serenoa repens, (SAW PALMETTO PO), Take 2 capsules by mouth in the morning and at bedtime.   amoxicillin (AMOXIL) 500 MG capsule, Take 4 capsules by mouth 1 hour prior to dental treatment   erythromycin ophthalmic ointment, Apply ointment to upper eyelids in the morning and at bedtime for 2 more weeks   omeprazole (PRILOSEC) 40 MG capsule, Take 1 capsule by mouth daily (Patient not taking: Reported on 11/06/2022) * These medications belong to multiple therapeutic classes and are listed under each  applicable group.   Reviewed prior external information including notes and imaging from  primary care provider As well as notes that were available from care everywhere and other healthcare systems.  Past medical history, social, surgical and family history all reviewed in electronic medical record.  No pertanent information unless stated regarding to the chief complaint.   Review of Systems:  No headache, visual changes, nausea, vomiting, diarrhea, constipation, dizziness,  abdominal pain, skin rash, fevers, chills, night sweats, weight loss, swollen lymph nodes, body aches, joint swelling, chest pain, shortness of breath, mood changes. POSITIVE muscle aches  Objective  Blood pressure 138/82, pulse 73, height  (1.854 m), weight 223 lb (101.2 kg), SpO2 98 %.   General: No apparent distress alert and oriented x3 mood and affect normal, dressed appropriately.  HEENT: Pupils equal, extraocular movements intact  Respiratory: Patient's speak in full sentences and does not appear short of breath  Cardiovascular: No lower extremity edema, non tender, no erythema  Bilateral hips are tender to palpation to bilaterally.  No severe over-the-counter trochanteric area.  Relatively good though internal range of motion noted.  Patient does have tightness with straight leg test bilaterally.  Knee exam does have some arthritic changes bilaterally.  No crepitus noted.  Instability noted with valgus and varus force  After informed written and verbal consent, patient was seated on exam table. Right knee was prepped with alcohol swab and utilizing anterolateral approach, patient's right knee space was injected with 48 mg per 3 mL of Monovisc (sodium hyaluronate) in a prefilled syringe was injected easily into the knee through a 22-gauge needle..Patient tolerated the procedure well without immediate complications.  After informed written and verbal consent, patient was seated on exam table. Left knee was  prepped with alcohol swab and utilizing anterolateral approach, patient's left knee space was injected with 40 mg per 3 mL of Monovisc (sodium hyaluronate) in a prefilled syringe was injected easily into the knee through a 22-gauge needle..Patient tolerated the procedure well without immediate complications.   After verbal consent patient was prepped with alcohol swab and with a 21-gauge 2 inch needle injected into the right greater trochanteric area with 2 cc of 0.5% Marcaine and 1 cc of Kenalog 40 mg/mL.  No blood loss.  Band-Aid placed.  Postinjection instructions given  After verbal consent patient was prepped in sterile fashion with alcohol swabs. Ethyl chloride used patient was injected with a 22-gauge 2 inch needle into the left  lateral hip in the greater trochanteric area under ultrasound guidance. Picture was taken. Patient had 4 cc of 0.5% Marcaine and 1 cc of Kenalog 40 mg/dL injected. Patient tolerated the procedure well and no blood loss. Pain completely resolved after injection stating proper placement. Post injection instructions given.    Impression and Recommendations:     The above documentation has been reviewed and is accurate and complete Judi Saa, DO

## 2023-02-23 ENCOUNTER — Ambulatory Visit (INDEPENDENT_AMBULATORY_CARE_PROVIDER_SITE_OTHER): Payer: Medicare Other

## 2023-02-23 ENCOUNTER — Encounter: Payer: Self-pay | Admitting: Family Medicine

## 2023-02-23 ENCOUNTER — Ambulatory Visit (INDEPENDENT_AMBULATORY_CARE_PROVIDER_SITE_OTHER): Payer: Medicare Other | Admitting: Family Medicine

## 2023-02-23 VITALS — BP 138/82 | HR 73 | Ht 73.0 in | Wt 223.0 lb

## 2023-02-23 DIAGNOSIS — M545 Low back pain, unspecified: Secondary | ICD-10-CM

## 2023-02-23 DIAGNOSIS — M170 Bilateral primary osteoarthritis of knee: Secondary | ICD-10-CM

## 2023-02-23 DIAGNOSIS — M7061 Trochanteric bursitis, right hip: Secondary | ICD-10-CM

## 2023-02-23 DIAGNOSIS — M7062 Trochanteric bursitis, left hip: Secondary | ICD-10-CM | POA: Diagnosis not present

## 2023-02-23 DIAGNOSIS — G8929 Other chronic pain: Secondary | ICD-10-CM | POA: Diagnosis not present

## 2023-02-23 DIAGNOSIS — M7071 Other bursitis of hip, right hip: Secondary | ICD-10-CM | POA: Insufficient documentation

## 2023-02-23 MED ORDER — HYALURONAN 88 MG/4ML IX SOSY
176.0000 mg | PREFILLED_SYRINGE | Freq: Once | INTRA_ARTICULAR | Status: AC
  Administered 2023-02-23: 176 mg via INTRA_ARTICULAR

## 2023-02-23 NOTE — Assessment & Plan Note (Signed)
Injections given bilaterally.  Discussed icing regimen and home exercises.  Continue to stay active.  Hopeful that patient will have time before any surgical intervention is necessary.  We did discuss that this can take up to a month before we notice significant improvement.  Follow-up with me again in 6 to 8 weeks.

## 2023-02-23 NOTE — Assessment & Plan Note (Signed)
Bilateral knees given.  I am concerned that there could be some underlying lumbar radiculitis that is contributing to the weakness of the hips that is causing the bursitis.  Given home exercises, we will get x-rays of the back again to further evaluate.  Worsening symptoms or weakness of the lower extremities do need to consider the possibility of advanced imaging.  Patient is already done formal physical therapy for this problem long-term.  In 6 to 8 weeks

## 2023-02-23 NOTE — Patient Instructions (Addendum)
Xray lumbar B GT injections B visco injections Ice hip before bed See me again in 2-3 months

## 2023-02-24 DIAGNOSIS — H2512 Age-related nuclear cataract, left eye: Secondary | ICD-10-CM | POA: Diagnosis not present

## 2023-02-24 DIAGNOSIS — H18513 Endothelial corneal dystrophy, bilateral: Secondary | ICD-10-CM | POA: Diagnosis not present

## 2023-02-24 DIAGNOSIS — H524 Presbyopia: Secondary | ICD-10-CM | POA: Diagnosis not present

## 2023-02-24 DIAGNOSIS — H52203 Unspecified astigmatism, bilateral: Secondary | ICD-10-CM | POA: Diagnosis not present

## 2023-03-01 ENCOUNTER — Other Ambulatory Visit (HOSPITAL_COMMUNITY): Payer: Self-pay

## 2023-03-02 ENCOUNTER — Other Ambulatory Visit (HOSPITAL_COMMUNITY): Payer: Self-pay

## 2023-03-02 MED ORDER — ROSUVASTATIN CALCIUM 20 MG PO TABS
20.0000 mg | ORAL_TABLET | Freq: Every day | ORAL | 3 refills | Status: DC
  Filled 2023-03-02: qty 90, 90d supply, fill #0
  Filled 2023-04-28 – 2023-05-28 (×2): qty 90, 90d supply, fill #1
  Filled 2023-07-27 – 2023-09-06 (×2): qty 90, 90d supply, fill #2
  Filled 2023-12-08: qty 90, 90d supply, fill #3

## 2023-03-11 ENCOUNTER — Other Ambulatory Visit: Payer: Self-pay

## 2023-03-11 DIAGNOSIS — M170 Bilateral primary osteoarthritis of knee: Secondary | ICD-10-CM

## 2023-03-12 NOTE — Therapy (Signed)
OUTPATIENT PHYSICAL THERAPY LOWER EXTREMITY EVALUATION   Patient Name: Cory Barnett MRN: [id number] DOB:1945/12/15, 77 y.o., male Today's Date: 03/16/2023  END OF SESSION:  PT End of Session - 03/16/23 0852     Visit Number 1    Date for PT Re-Evaluation 05/14/23    Authorization Type Medicare (KX at 15)    Progress Note Due on Visit 10    PT Start Time 0803    PT Stop Time 0846    PT Time Calculation (min) 43 min    Activity Tolerance Patient tolerated treatment well    Behavior During Therapy [facility] for tasks assessed/performed             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   IFG (impaired fasting glucose) 01/2016   Pre-diabetes    RBBB (right bundle branch block)    Rheumatic fever    age 40 or 52, in hospital for a month, out of school for a yr   Seasonal allergies    Squamous cell carcinoma    Testicular hypofunction    Past Surgical History:  Procedure Laterality Date   EYE SURGERY     right eye  cataract surgery with lens implant   NASAL SEPTUM SURGERY     NOSE SURGERY     REVERSE SHOULDER ARTHROPLASTY Right 01/11/2020   Procedure: REVERSE SHOULDER ARTHROPLASTY;  Surgeon: Jones Broom, MD;  Location: [facility];  Service: Orthopedics;  Laterality: Right;   SHOULDER ARTHROSCOPY WITH SUBACROMIAL DECOMPRESSION Right 08/07/2019   Procedure: SHOULDER ARTHROSCOPY WITH SUBACROMIAL DECOMPRESSION;  Surgeon: Jones Broom, MD;  Location: [facility];  Service: Orthopedics;  Laterality: Right;   SKIN CANCER EXCISION     basal and squamous cell    TONSILLECTOMY     VASECTOMY     WISDOM TOOTH EXTRACTION     Patient Active Problem List   Diagnosis Date Noted   Bursitis of both hips 02/23/2023   Degenerative arthritis of knee, bilateral  11/08/2022   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   Left knee pain 08/05/2020   Piriformis syndrome of left side 08/18/2019   Rotator cuff tear, right 05/29/2019   Closed fracture of distal clavicle 05/15/2019   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    PCP: Rodrigo Ran, MD   REFERRING PROVIDER: Antoine Primas, MD  REFERRING DIAG: M17.0 (ICD-10-CM) - Primary osteoarthritis of both knees   THERAPY DIAG:  Chronic pain of left knee - Plan: PT plan of care cert/re-cert  Other abnormalities of gait and mobility - Plan: PT plan of care cert/re-cert  Muscle weakness (generalized) - Plan: PT plan of care cert/re-cert  Rationale for Evaluation and Treatment: Rehabilitation  ONSET DATE: chronic with recent falls   SUBJECTIVE:   SUBJECTIVE STATEMENT: Pt presents to PT with complaints of chronic bil knee pain secondary to OA, hip pain and recent instability and falls.   PERTINENT HISTORY: Anxiety, HTN, reverse shoulder replacement (2021) PAIN:  Are you having pain? Yes: NPRS scale: 0/10 (7/10 when going up/down steps) Pain location: Lt>Rt knee  Pain description: sore, intense  Aggravating factors: going up/down steps  Relieving factors: advil   PRECAUTIONS: Fall  WEIGHT BEARING RESTRICTIONS: No  FALLS:  Has  patient fallen in last 6 months? Yes. Number of falls tripped going up steps and caught foot   LIVING ENVIRONMENT: Lives with: lives with their spouse Lives in: House/apartment Stairs: Yes: External: 3 steps; on right going up Has following equipment at home: None  OCCUPATION: retired  PLOF: Independent, Vocation/Vocational requirements: desk work, walking-retired but small amount of work, and Leisure: walking for Psychologist, sport and exercise every Friday for strength training.   PATIENT GOALS: reduce pain, improve balance   NEXT MD VISIT: 6 weeks   OBJECTIVE:   DIAGNOSTIC FINDINGS: X-ray: Left knee Degenerative changes. No acute osseous abnormalities  Right knee:   Degenerative changes. No acute osseous abnormalities.    COGNITION: Overall cognitive status: Within functional limits for tasks assessed     SENSATION: WFL  SLS Rt x 10, Lt x2 seconds  MUSCLE LENGTH: Hamstring flexibility limited by 25% bilaterally  POSTURE: rounded shoulders and forward head  PALPATION: Diffuse palpable tenderness in bil quads, and with patellar mobs/compression   LOWER EXTREMITY ROM:  Knee extension limited   LOWER EXTREMITY MMT:  MMT Right eval Left eval  Hip flexion 4 4  Hip extension 4+ 4+  Hip abduction 4 4  Hip adduction    Hip internal rotation    Hip external rotation    Knee flexion 4+ 4+  Knee extension 4+ 4+  Ankle dorsiflexion 5 5  Ankle plantarflexion    Ankle inversion    Ankle eversion     (Blank rows = not tested)   FUNCTIONAL TESTS:  5/14/245x sit to stand: 17.32 seconds Berg Balance Test: 52/56  GAIT: Distance walked: 100 Assistive device utilized: None Level of assistance: Complete Independence Comments: bil knee flexion    TODAY'S TREATMENT:                                                                                                                              DATE: 03/16/23 HEP established-see below  PATIENT EDUCATION:  Education details: Access Code: [email or web address] Person educated: Patient Education method: Programmer, multimedia, Facilities manager, and Handouts Education comprehension: verbalized understanding and returned demonstration  HOME EXERCISE PROGRAM: Access Code: [email or web address] URL: [email or web address] Date: 03/16/2023 Prepared by: Tresa Endo  Exercises - Sit to Stand Without Arm Support  - 2 x daily - 7 x weekly - 2 sets - 5 reps - Seated Hamstring Stretch  - 3 x daily - 7 x weekly - 1 sets - 3 reps - 20 hold - Gastroc Stretch on Wall  - 3 x daily - 7 x weekly - 1 sets - 3 reps - 20 hold - Standing Tandem Balance with Counter Support  - 2 x daily - 7 x weekly - 1 sets - 5 reps - 10-20 sec  hold  ASSESSMENT:  CLINICAL IMPRESSION: Patient is a 78 y.o. male who was seen today for physical therapy evaluation and treatment for bil knee pain and balance deficits. He reports feeling more unsteady recently. He had one fall  when tripping up the steps and denies any additional falls. He has history of bil knee OA and has responded well to injections.  He reports up to 7/10 Lt knee pain with negotiating steps.  Pt requires UE support for good control with sit to stand transition.   Pt with increased sit to stand time, indicating that he is at falls risk.  Crepitus in bil knees with flexion/extension.  Patient will benefit from skilled PT to address the below impairments and improve overall function.   OBJECTIVE IMPAIRMENTS: Abnormal gait, decreased activity tolerance, decreased balance, decreased endurance, difficulty walking, decreased strength, decreased safety awareness, increased muscle spasms, impaired flexibility, improper body mechanics, and pain.   ACTIVITY LIMITATIONS: carrying, squatting, stairs, transfers, and locomotion level  PARTICIPATION LIMITATIONS: community activity, occupation, and yard work  PERSONAL FACTORS: Age, Time since onset of injury/illness/exacerbation, and 1 comorbidity: OA  are also affecting patient's functional outcome.   REHAB POTENTIAL: Good  CLINICAL DECISION MAKING: Evolving/moderate complexity  EVALUATION COMPLEXITY: Moderate   GOALS: Goals reviewed with patient? Yes  SHORT TERM GOALS: Target date: 04/13/2023   Be independent in initial HEP Baseline: Goal status: INITIAL  2.  Perform 5x sit to stand in < 14 seconds without UE  support to reduce falls risk Baseline: 17.3  Goal status: INITIAL  3.  Report > or = to 30% reduction in Lt knee pain with negotiating steps  Baseline:  Goal status: INITIAL  LONG TERM GOALS: Target date: 05/14/23  Be independent in advanced HEP Baseline:  Goal status: INITIAL  2.  Perform 5x sit to stand in <  or = to 12 seconds to reduce falls risk  Baseline: 17.3 Goal status: INITIAL  3.  Report  or = to 60% reduction in Lt knee pain with negotiating steps  Baseline: 7/10 Goal status: INITIAL  4.  Improve bil hip and knee strength to 4+/5 to 5/5 to improve functional mobility Baseline:  Goal status: INITIAL  5.  Improve Berg Balance Test to > or = to 55/56 to reduce falls risk  Baseline: 52/56 Goal status: INITIAL PLAN:  PT FREQUENCY: 2x/week  PT DURATION: 8 weeks  PLANNED INTERVENTIONS: Therapeutic exercises, Therapeutic activity, Neuromuscular re-education, Balance training, Gait training, Patient/Family education, Self Care, Joint mobilization, Stair training, Vestibular training, Aquatic Therapy, Dry Needling, Cryotherapy, Moist heat, Manual therapy, and Re-evaluation  PLAN FOR NEXT SESSION: Quad and glute strength, balance tasks, hip and knee flexibility   Lorrene Reid, PT 03/16/23 10:36 AM

## 2023-03-16 ENCOUNTER — Ambulatory Visit: Payer: Medicare Other | Attending: Family Medicine

## 2023-03-16 ENCOUNTER — Other Ambulatory Visit: Payer: Self-pay

## 2023-03-16 DIAGNOSIS — G4733 Obstructive sleep apnea (adult) (pediatric): Secondary | ICD-10-CM | POA: Diagnosis not present

## 2023-03-16 DIAGNOSIS — M6281 Muscle weakness (generalized): Secondary | ICD-10-CM | POA: Diagnosis not present

## 2023-03-16 DIAGNOSIS — R2689 Other abnormalities of gait and mobility: Secondary | ICD-10-CM | POA: Insufficient documentation

## 2023-03-16 DIAGNOSIS — M170 Bilateral primary osteoarthritis of knee: Secondary | ICD-10-CM | POA: Diagnosis not present

## 2023-03-16 DIAGNOSIS — G8929 Other chronic pain: Secondary | ICD-10-CM | POA: Insufficient documentation

## 2023-03-16 DIAGNOSIS — M25562 Pain in left knee: Secondary | ICD-10-CM | POA: Diagnosis not present

## 2023-03-18 ENCOUNTER — Ambulatory Visit: Payer: Medicare Other | Admitting: Cardiology

## 2023-03-18 ENCOUNTER — Ambulatory Visit: Payer: Medicare Other

## 2023-03-23 ENCOUNTER — Ambulatory Visit: Payer: Medicare Other

## 2023-03-23 DIAGNOSIS — R2689 Other abnormalities of gait and mobility: Secondary | ICD-10-CM

## 2023-03-23 DIAGNOSIS — M6281 Muscle weakness (generalized): Secondary | ICD-10-CM | POA: Diagnosis not present

## 2023-03-23 DIAGNOSIS — M170 Bilateral primary osteoarthritis of knee: Secondary | ICD-10-CM | POA: Diagnosis not present

## 2023-03-23 DIAGNOSIS — G8929 Other chronic pain: Secondary | ICD-10-CM

## 2023-03-23 DIAGNOSIS — M25562 Pain in left knee: Secondary | ICD-10-CM | POA: Diagnosis not present

## 2023-03-23 NOTE — Therapy (Signed)
OUTPATIENT PHYSICAL THERAPY TREATMENT   Patient Name: Cory Barnett MRN: [id number] DOB:03-24-1946, 77 y.o., male Today's Date: 03/23/2023  END OF SESSION:  PT End of Session - 03/23/23 0846     Visit Number 2    Date for PT Re-Evaluation 05/14/23    Authorization Type Medicare (KX at 15)    Progress Note Due on Visit 10    PT Start Time 0802    PT Stop Time 0846    PT Time Calculation (min) 44 min    Activity Tolerance Patient tolerated treatment well    Behavior During Therapy [facility] for tasks assessed/performed              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   IFG (impaired fasting glucose) 01/2016   Pre-diabetes    RBBB (right bundle branch block)    Rheumatic fever    age 9 or 83, in hospital for a month, out of school for a yr   Seasonal allergies    Squamous cell carcinoma    Testicular hypofunction    Past Surgical History:  Procedure Laterality Date   EYE SURGERY     right eye  cataract surgery with lens implant   NASAL SEPTUM SURGERY     NOSE SURGERY     REVERSE SHOULDER ARTHROPLASTY Right 01/11/2020   Procedure: REVERSE SHOULDER ARTHROPLASTY;  Surgeon: Jones Broom, MD;  Location: [facility];  Service: Orthopedics;  Laterality: Right;   SHOULDER ARTHROSCOPY WITH SUBACROMIAL DECOMPRESSION Right 08/07/2019   Procedure: SHOULDER ARTHROSCOPY WITH SUBACROMIAL DECOMPRESSION;  Surgeon: Jones Broom, MD;  Location: [facility];  Service: Orthopedics;  Laterality: Right;   SKIN CANCER EXCISION     basal and squamous cell    TONSILLECTOMY     VASECTOMY     WISDOM TOOTH EXTRACTION     Patient Active Problem List   Diagnosis Date Noted   Bursitis of both hips 02/23/2023   Degenerative arthritis of knee, bilateral 11/08/2022    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   Left knee pain 08/05/2020   Piriformis syndrome of left side 08/18/2019   Rotator cuff tear, right 05/29/2019   Closed fracture of distal clavicle 05/15/2019   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    PCP: Rodrigo Ran, MD   REFERRING PROVIDER: Antoine Primas, MD  REFERRING DIAG: M17.0 (ICD-10-CM) - Primary osteoarthritis of both knees   THERAPY DIAG:  Chronic pain of left knee  Other abnormalities of gait and mobility  Muscle weakness (generalized)  Rationale for Evaluation and Treatment: Rehabilitation  ONSET DATE: chronic with recent falls   SUBJECTIVE:   SUBJECTIVE STATEMENT: I am working on my exercises.  I really feel my knees.     PERTINENT HISTORY: Anxiety, HTN, reverse shoulder replacement (2021) PAIN:  Are you having pain? Yes: NPRS scale: 0/10 (7/10 when going up/down steps) Pain location: Lt>Rt knee  Pain description: sore, intense  Aggravating factors: going up/down steps  Relieving factors: advil   PRECAUTIONS: Fall  WEIGHT BEARING RESTRICTIONS: No  FALLS:  Has patient fallen in last 6 months? Yes. Number of falls tripped going up steps and caught foot   LIVING ENVIRONMENT: Lives with: lives with their spouse Lives in:  House/apartment Stairs: Yes: External: 3 steps; on right going up Has following equipment at home: None  OCCUPATION: retired  PLOF: Independent, Vocation/Vocational requirements: desk work, walking-retired but small amount of work, and Leisure: walking for Psychologist, sport and exercise every Friday for strength training.   PATIENT GOALS: reduce pain, improve balance   NEXT MD VISIT: 6 weeks   OBJECTIVE:   DIAGNOSTIC FINDINGS: X-ray: Left knee Degenerative changes. No acute osseous abnormalities  Right knee:  Degenerative changes. No acute osseous abnormalities.    COGNITION: Overall cognitive status: Within functional limits for tasks assessed     SENSATION: WFL  SLS Rt x 10, Lt x2  seconds  MUSCLE LENGTH: Hamstring flexibility limited by 25% bilaterally  POSTURE: rounded shoulders and forward head  PALPATION: Diffuse palpable tenderness in bil quads, and with patellar mobs/compression   LOWER EXTREMITY ROM:  Knee extension limited   LOWER EXTREMITY MMT:  MMT Right eval Left eval  Hip flexion 4 4  Hip extension 4+ 4+  Hip abduction 4 4  Hip adduction    Hip internal rotation    Hip external rotation    Knee flexion 4+ 4+  Knee extension 4+ 4+  Ankle dorsiflexion 5 5  Ankle plantarflexion    Ankle inversion    Ankle eversion     (Blank rows = not tested)   FUNCTIONAL TESTS:  5/14/245x sit to stand: 17.32 seconds Berg Balance Test: 52/56  GAIT: Distance walked: 100 Assistive device utilized: None Level of assistance: Complete Independence Comments: bil knee flexion    TODAY'S TREATMENT:      DATE: 03/23/23 NuStep: Level 4x 6 minutes-PT present to discuss progress Seated hamstring stretch: 3x20 seconds  Sit to stand-seated on pad x5- knee pain with this  Standing rockerboard: x3 minutes  Airex balance beam: sidestepping and tandem line walk in // bars- min to no UE support Hurdles: step over step forward and sidestepping  Tandem stance: 20 seconds x3 each Long arc quads: 5" hold x10 each 6" step-ups with focus on quad activation, 6" step downs Hamstring stretch with power plate [id number] seconds                                                                                                                         DATE: 03/16/23 HEP established-see below  PATIENT EDUCATION:  Education details: Access Code: [email or web address] Person educated: Patient Education method: Programmer, multimedia, Facilities manager, and Handouts Education comprehension: verbalized understanding and returned demonstration  HOME EXERCISE PROGRAM: Access Code: [email or web address] URL: [email or web address] Date: 03/16/2023 Prepared by: Tresa Endo  Exercises - Sit to Stand Without Arm  Support  - 2 x daily - 7 x weekly - 2 sets - 5 reps - Seated Hamstring Stretch  - 3 x daily - 7 x weekly - 1 sets - 3 reps - 20 hold - Gastroc Stretch on Wall  - 3 x daily - 7 x weekly - 1 sets - 3 reps - 20 hold - Standing Tandem Balance with Counter  Support  - 2 x daily - 7 x weekly - 1 sets - 5 reps - 10-20 sec hold  ASSESSMENT:  CLINICAL IMPRESSION: First time follow-up after evaluation.  Pt with improved technique and control with sit to stand with feet hip distance apart.  Pt with pain with step up and down on 6" step.  Pt able to perform balance tasks with min to no UE support.  PT present to monitor for pain and technique. Patient will benefit from skilled PT to address the below impairments and improve overall function.   OBJECTIVE IMPAIRMENTS: Abnormal gait, decreased activity tolerance, decreased balance, decreased endurance, difficulty walking, decreased strength, decreased safety awareness, increased muscle spasms, impaired flexibility, improper body mechanics, and pain.   ACTIVITY LIMITATIONS: carrying, squatting, stairs, transfers, and locomotion level  PARTICIPATION LIMITATIONS: community activity, occupation, and yard work  PERSONAL FACTORS: Age, Time since onset of injury/illness/exacerbation, and 1 comorbidity: OA  are also affecting patient's functional outcome.   REHAB POTENTIAL: Good  CLINICAL DECISION MAKING: Evolving/moderate complexity  EVALUATION COMPLEXITY: Moderate   GOALS: Goals reviewed with patient? Yes  SHORT TERM GOALS: Target date: 04/13/2023   Be independent in initial HEP Baseline: Goal status: INITIAL  2.  Perform 5x sit to stand in < 14 seconds without UE  support to reduce falls risk Baseline: 17.3  Goal status: INITIAL  3.  Report > or = to 30% reduction in Lt knee pain with negotiating steps  Baseline:  Goal status: INITIAL  LONG TERM GOALS: Target date: 05/14/23  Be independent in advanced HEP Baseline:  Goal status: INITIAL  2.   Perform 5x sit to stand in < or = to 12 seconds to reduce falls risk  Baseline: 17.3 Goal status: INITIAL  3.  Report  or = to 60% reduction in Lt knee pain with negotiating steps  Baseline: 7/10 Goal status: INITIAL  4.  Improve bil hip and knee strength to 4+/5 to 5/5 to improve functional mobility Baseline:  Goal status: INITIAL  5.  Improve Berg Balance Test to > or = to 55/56 to reduce falls risk  Baseline: 52/56 Goal status: INITIAL PLAN:  PT FREQUENCY: 2x/week  PT DURATION: 8 weeks  PLANNED INTERVENTIONS: Therapeutic exercises, Therapeutic activity, Neuromuscular re-education, Balance training, Gait training, Patient/Family education, Self Care, Joint mobilization, Stair training, Vestibular training, Aquatic Therapy, Dry Needling, Cryotherapy, Moist heat, Manual therapy, and Re-evaluation  PLAN FOR NEXT SESSION: Quad and glute strength, balance tasks, hip and knee flexibility   Lorrene Reid, PT 03/23/23 8:48 AM

## 2023-03-24 DIAGNOSIS — E531 Pyridoxine deficiency: Secondary | ICD-10-CM | POA: Diagnosis not present

## 2023-03-24 DIAGNOSIS — E559 Vitamin D deficiency, unspecified: Secondary | ICD-10-CM | POA: Diagnosis not present

## 2023-03-24 DIAGNOSIS — E063 Autoimmune thyroiditis: Secondary | ICD-10-CM | POA: Diagnosis not present

## 2023-03-24 DIAGNOSIS — N401 Enlarged prostate with lower urinary tract symptoms: Secondary | ICD-10-CM | POA: Diagnosis not present

## 2023-03-24 DIAGNOSIS — R7303 Prediabetes: Secondary | ICD-10-CM | POA: Diagnosis not present

## 2023-03-24 DIAGNOSIS — E611 Iron deficiency: Secondary | ICD-10-CM | POA: Diagnosis not present

## 2023-03-24 DIAGNOSIS — D513 Other dietary vitamin B12 deficiency anemia: Secondary | ICD-10-CM | POA: Diagnosis not present

## 2023-03-24 DIAGNOSIS — D520 Dietary folate deficiency anemia: Secondary | ICD-10-CM | POA: Diagnosis not present

## 2023-03-24 DIAGNOSIS — E348 Other specified endocrine disorders: Secondary | ICD-10-CM | POA: Diagnosis not present

## 2023-03-24 DIAGNOSIS — E568 Deficiency of other vitamins: Secondary | ICD-10-CM | POA: Diagnosis not present

## 2023-03-24 DIAGNOSIS — R7982 Elevated C-reactive protein (CRP): Secondary | ICD-10-CM | POA: Diagnosis not present

## 2023-03-24 DIAGNOSIS — E038 Other specified hypothyroidism: Secondary | ICD-10-CM | POA: Diagnosis not present

## 2023-03-24 DIAGNOSIS — E782 Mixed hyperlipidemia: Secondary | ICD-10-CM | POA: Diagnosis not present

## 2023-03-24 DIAGNOSIS — R350 Frequency of micturition: Secondary | ICD-10-CM | POA: Diagnosis not present

## 2023-03-24 DIAGNOSIS — E7211 Homocystinuria: Secondary | ICD-10-CM | POA: Diagnosis not present

## 2023-03-24 DIAGNOSIS — E8889 Other specified metabolic disorders: Secondary | ICD-10-CM | POA: Diagnosis not present

## 2023-03-25 ENCOUNTER — Ambulatory Visit: Payer: Medicare Other

## 2023-03-25 DIAGNOSIS — M6281 Muscle weakness (generalized): Secondary | ICD-10-CM | POA: Diagnosis not present

## 2023-03-25 DIAGNOSIS — M25562 Pain in left knee: Secondary | ICD-10-CM | POA: Diagnosis not present

## 2023-03-25 DIAGNOSIS — M170 Bilateral primary osteoarthritis of knee: Secondary | ICD-10-CM | POA: Diagnosis not present

## 2023-03-25 DIAGNOSIS — R2689 Other abnormalities of gait and mobility: Secondary | ICD-10-CM

## 2023-03-25 DIAGNOSIS — G8929 Other chronic pain: Secondary | ICD-10-CM

## 2023-03-25 NOTE — Therapy (Signed)
OUTPATIENT PHYSICAL THERAPY TREATMENT   Patient Name: Cory Barnett MRN: [id number] DOB:February 17, 1946, 77 y.o., male Today's Date: 03/25/2023  END OF SESSION:  PT End of Session - 03/25/23 1106     Visit Number 3    Date for PT Re-Evaluation 05/14/23    Authorization Type Medicare (KX at 15)    Progress Note Due on Visit 10    PT Start Time 1017    PT Stop Time 1059    PT Time Calculation (min) 42 min    Activity Tolerance Patient tolerated treatment well    Behavior During Therapy WFL for tasks assessed/performed               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   IFG (impaired fasting glucose) 01/2016   Pre-diabetes    RBBB (right bundle branch block)    Rheumatic fever    age 61 or 27, in hospital for a month, out of school for a yr   Seasonal allergies    Squamous cell carcinoma    Testicular hypofunction    Past Surgical History:  Procedure Laterality Date   EYE SURGERY     right eye  cataract surgery with lens implant   NASAL SEPTUM SURGERY     NOSE SURGERY     REVERSE SHOULDER ARTHROPLASTY Right 01/11/2020   Procedure: REVERSE SHOULDER ARTHROPLASTY;  Surgeon: Jones Broom, MD;  Location: [facility];  Service: Orthopedics;  Laterality: Right;   SHOULDER ARTHROSCOPY WITH SUBACROMIAL DECOMPRESSION Right 08/07/2019   Procedure: SHOULDER ARTHROSCOPY WITH SUBACROMIAL DECOMPRESSION;  Surgeon: Jones Broom, MD;  Location: [facility];  Service: Orthopedics;  Laterality: Right;   SKIN CANCER EXCISION     basal and squamous cell    TONSILLECTOMY     VASECTOMY     WISDOM TOOTH EXTRACTION     Patient Active Problem List   Diagnosis Date Noted   Bursitis of both hips 02/23/2023   Degenerative arthritis of knee, bilateral 11/08/2022    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   Left knee pain 08/05/2020   Piriformis syndrome of left side 08/18/2019   Rotator cuff tear, right 05/29/2019   Closed fracture of distal clavicle 05/15/2019   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    PCP: Rodrigo Ran, MD   REFERRING PROVIDER: Antoine Primas, MD  REFERRING DIAG: M17.0 (ICD-10-CM) - Primary osteoarthritis of both knees   THERAPY DIAG:  Chronic pain of left knee  Muscle weakness (generalized)  Other abnormalities of gait and mobility  Rationale for Evaluation and Treatment: Rehabilitation  ONSET DATE: chronic with recent falls   SUBJECTIVE:   SUBJECTIVE STATEMENT: I am having pain in my Lt gluteals. Not sure what I did.    PERTINENT HISTORY: Anxiety, HTN, reverse shoulder replacement (2021) PAIN:  Are you having pain? Yes: NPRS scale: 6/10 (Lt gluteals) (7/10 when going up/down steps) Pain location: Lt>Rt knee  Pain description: sore, intense  Aggravating factors: going up/down steps  Relieving factors: advil   PRECAUTIONS: Fall  WEIGHT BEARING RESTRICTIONS: No  FALLS:  Has patient fallen in last 6 months? Yes. Number of falls tripped going up steps and caught foot   LIVING ENVIRONMENT: Lives with: lives with their  spouse Lives in: House/apartment Stairs: Yes: External: 3 steps; on right going up Has following equipment at home: None  OCCUPATION: retired  PLOF: Independent, Vocation/Vocational requirements: desk work, walking-retired but small amount of work, and Leisure: walking for Psychologist, sport and exercise every Friday for strength training.   PATIENT GOALS: reduce pain, improve balance   NEXT MD VISIT: 6 weeks   OBJECTIVE:   DIAGNOSTIC FINDINGS: X-ray: Left knee Degenerative changes. No acute osseous abnormalities  Right knee:  Degenerative changes. No acute osseous abnormalities.    COGNITION: Overall cognitive status: Within functional limits for tasks assessed     SENSATION: WFL  SLS Rt x  10, Lt x2 seconds  MUSCLE LENGTH: Hamstring flexibility limited by 25% bilaterally  POSTURE: rounded shoulders and forward head  PALPATION: Diffuse palpable tenderness in bil quads, and with patellar mobs/compression   LOWER EXTREMITY ROM:  Knee extension limited   LOWER EXTREMITY MMT:  MMT Right eval Left eval  Hip flexion 4 4  Hip extension 4+ 4+  Hip abduction 4 4  Hip adduction    Hip internal rotation    Hip external rotation    Knee flexion 4+ 4+  Knee extension 4+ 4+  Ankle dorsiflexion 5 5  Ankle plantarflexion    Ankle inversion    Ankle eversion     (Blank rows = not tested)   FUNCTIONAL TESTS:  5/14/245x sit to stand: 17.32 seconds Berg Balance Test: 52/56  GAIT: Distance walked: 100 Assistive device utilized: None Level of assistance: Complete Independence Comments: bil knee flexion    TODAY'S TREATMENT:     DATE: 03/25/23 NuStep: Level 5 x 6 minutes-PT present to discuss progress Figure 4 3x20 seconds Rt and Lt seated  Sit to stand-seated on pad x5- Lt knee pain with this  Standing hip extension and abduction with red band x10 each bil Airex balance beam: sidestepping and tandem line walk in // bars- min to no UE support Hurdles: step over step forward with added cobble blocks 6" step-ups with focus on quad activation, 6" step downs Hamstring stretch with power plate 4O96 seconds   Trigger Point Dry-Needling  Treatment instructions: Expect mild to moderate muscle soreness. S/S of pneumothorax if dry needled over a lung field, and to seek immediate medical attention should they occur. Patient verbalized understanding of these instructions and education.  Patient Consent Given: Yes Education handout provided: Previously provided Muscles treated: Lt gluteals Treatment response/outcome: Utilized skilled palpation to identify trigger points.  During dry needling able to palpate muscle twitch and muscle elongation  Addaday to Lt gluteals after  needling  Skilled palpation and monitoring by PT during dry needling  DATE: 03/23/23 NuStep: Level 4x 6 minutes-PT present to discuss progress Seated hamstring stretch: 3x20 seconds  Sit to stand-seated on pad x5- knee pain with this  Standing rockerboard: x3 minutes  Hurdles: step over step forward and sidestepping  Tandem stance: 20 seconds x3 each Long arc quads: 5" hold x10 each 6" step-ups with focus on quad activation, 6" step downs Hamstring stretch with power plate 2X52 seconds  DATE: 03/16/23 HEP established-see below  PATIENT EDUCATION:  Education details: Access Code: [email or web address] Person educated: Patient Education method: Explanation, Facilities manager, and Handouts Education comprehension: verbalized understanding and returned demonstration  HOME EXERCISE PROGRAM: Access Code: [email or web address] URL: [email or web address] Date: 03/16/2023 Prepared by: Tresa Endo  Exercises - Sit to Stand Without Arm Support  - 2 x daily - 7 x weekly - 2 sets - 5 reps - Seated Hamstring Stretch  - 3 x daily - 7 x weekly - 1 sets - 3 reps - 20 hold - Gastroc Stretch on Wall  - 3 x daily - 7 x weekly - 1 sets - 3 reps - 20 hold - Standing Tandem Balance with Counter Support  - 2 x daily - 7 x weekly - 1 sets - 5 reps - 10-20 sec hold  ASSESSMENT:  CLINICAL IMPRESSION: Pt arrived with Lt gluteal pain that began yesterday.  Pt with improved technique and control with sit to stand with feet hip distance apart.  Some Lt knee pain with this.   Pt with improved control with Lt 6" step up with reduced use of momentum.   Pt was able to balance against increased challenge with hurdles and cobble blocks today.  Pt with trigger points in Lt gluteals and had good response to DN with improved tissue mobility, multiple twitch responses and reduced pain reported post session. PT present to  monitor for pain and technique. Patient will benefit from skilled PT to address the below impairments and improve overall function.   OBJECTIVE IMPAIRMENTS: Abnormal gait, decreased activity tolerance, decreased balance, decreased endurance, difficulty walking, decreased strength, decreased safety awareness, increased muscle spasms, impaired flexibility, improper body mechanics, and pain.   ACTIVITY LIMITATIONS: carrying, squatting, stairs, transfers, and locomotion level  PARTICIPATION LIMITATIONS: community activity, occupation, and yard work  PERSONAL FACTORS: Age, Time since onset of injury/illness/exacerbation, and 1 comorbidity: OA  are also affecting patient's functional outcome.   REHAB POTENTIAL: Good  CLINICAL DECISION MAKING: Evolving/moderate complexity  EVALUATION COMPLEXITY: Moderate   GOALS: Goals reviewed with patient? Yes  SHORT TERM GOALS: Target date: 04/13/2023   Be independent in initial HEP Baseline: Goal status: MET  2.  Perform 5x sit to stand in < 14 seconds without UE  support to reduce falls risk Baseline: 17.3  Goal status: INITIAL  3.  Report > or = to 30% reduction in Lt knee pain with negotiating steps  Baseline:  Goal status: INITIAL  LONG TERM GOALS: Target date: 05/14/23  Be independent in advanced HEP Baseline:  Goal status: INITIAL  2.  Perform 5x sit to stand in < or = to 12 seconds to reduce falls risk  Baseline: 17.3 Goal status: INITIAL  3.  Report  or = to 60% reduction in Lt knee pain with negotiating steps  Baseline: 7/10 Goal status: INITIAL  4.  Improve bil hip and knee strength to 4+/5 to 5/5 to improve functional mobility Baseline:  Goal status: INITIAL  5.  Improve Berg Balance Test to > or = to 55/56 to reduce falls risk  Baseline: 52/56 Goal status: INITIAL PLAN:  PT FREQUENCY: 2x/week  PT DURATION: 8 weeks  PLANNED INTERVENTIONS: Therapeutic exercises, Therapeutic activity, Neuromuscular re-education,  Balance training, Gait training, Patient/Family education, Self Care, Joint mobilization, Stair training, Vestibular training, Aquatic Therapy, Dry Needling, Cryotherapy, Moist heat, Manual therapy, and Re-evaluation  PLAN FOR NEXT SESSION: Quad and glute strength, balance tasks, hip and knee flexibility. Test 5x sit to stand, needle gluteals again if helpful  Lorrene Reid, PT 03/25/23 11:10 AM

## 2023-03-30 ENCOUNTER — Ambulatory Visit: Payer: Medicare Other

## 2023-03-30 DIAGNOSIS — M6281 Muscle weakness (generalized): Secondary | ICD-10-CM | POA: Diagnosis not present

## 2023-03-30 DIAGNOSIS — G8929 Other chronic pain: Secondary | ICD-10-CM | POA: Diagnosis not present

## 2023-03-30 DIAGNOSIS — M25562 Pain in left knee: Secondary | ICD-10-CM | POA: Diagnosis not present

## 2023-03-30 DIAGNOSIS — R2689 Other abnormalities of gait and mobility: Secondary | ICD-10-CM | POA: Diagnosis not present

## 2023-03-30 DIAGNOSIS — M170 Bilateral primary osteoarthritis of knee: Secondary | ICD-10-CM | POA: Diagnosis not present

## 2023-03-30 NOTE — Therapy (Signed)
OUTPATIENT PHYSICAL THERAPY TREATMENT   Patient Name: Cory Barnett MRN: [id number] DOB:07/26/1946, 77 y.o., male Today's Date: 03/30/2023  END OF SESSION:  PT End of Session - 03/30/23 0848     Visit Number 4    Date for PT Re-Evaluation 05/14/23    Authorization Type Medicare (KX at 15)    Progress Note Due on Visit 10    PT Start Time 0807    PT Stop Time 0843    PT Time Calculation (min) 36 min    Activity Tolerance Patient tolerated treatment well    Behavior During Therapy [facility] for tasks assessed/performed                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   IFG (impaired fasting glucose) 01/2016   Pre-diabetes    RBBB (right bundle branch block)    Rheumatic fever    age 40 or 29, in hospital for a month, out of school for a yr   Seasonal allergies    Squamous cell carcinoma    Testicular hypofunction    Past Surgical History:  Procedure Laterality Date   EYE SURGERY     right eye  cataract surgery with lens implant   NASAL SEPTUM SURGERY     NOSE SURGERY     REVERSE SHOULDER ARTHROPLASTY Right 01/11/2020   Procedure: REVERSE SHOULDER ARTHROPLASTY;  Surgeon: Jones Broom, MD;  Location: [facility];  Service: Orthopedics;  Laterality: Right;   SHOULDER ARTHROSCOPY WITH SUBACROMIAL DECOMPRESSION Right 08/07/2019   Procedure: SHOULDER ARTHROSCOPY WITH SUBACROMIAL DECOMPRESSION;  Surgeon: Jones Broom, MD;  Location: [facility];  Service: Orthopedics;  Laterality: Right;   SKIN CANCER EXCISION     basal and squamous cell    TONSILLECTOMY     VASECTOMY     WISDOM TOOTH EXTRACTION     Patient Active Problem List   Diagnosis Date Noted   Bursitis of both hips 02/23/2023   Degenerative arthritis of knee, bilateral 11/08/2022    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   Left knee pain 08/05/2020   Piriformis syndrome of left side 08/18/2019   Rotator cuff tear, right 05/29/2019   Closed fracture of distal clavicle 05/15/2019   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    PCP: Rodrigo Ran, MD   REFERRING PROVIDER: Antoine Primas, MD  REFERRING DIAG: M17.0 (ICD-10-CM) - Primary osteoarthritis of both knees   THERAPY DIAG:  Chronic pain of left knee  Muscle weakness (generalized)  Other abnormalities of gait and mobility  Rationale for Evaluation and Treatment: Rehabilitation  ONSET DATE: chronic with recent falls   SUBJECTIVE:   SUBJECTIVE STATEMENT: I am having pain in my Lt gluteals. Not sure what I did.    PERTINENT HISTORY: Anxiety, HTN, reverse shoulder replacement (2021) PAIN:  Are you having pain? Yes: NPRS scale: 0.5/10 Lt glute, 3/10 bil knee Pain location: Lt>Rt knee  Pain description: sore, intense  Aggravating factors: going up/down steps  Relieving factors: advil   PRECAUTIONS: Fall  WEIGHT BEARING RESTRICTIONS: No  FALLS:  Has patient fallen in last 6 months? Yes. Number of falls tripped going up steps and caught foot   LIVING ENVIRONMENT: Lives with: lives with their spouse  Lives in: House/apartment Stairs: Yes: External: 3 steps; on right going up Has following equipment at home: None  OCCUPATION: retired  PLOF: Independent, Vocation/Vocational requirements: desk work, walking-retired but small amount of work, and Leisure: walking for Psychologist, sport and exercise every Friday for strength training.   PATIENT GOALS: reduce pain, improve balance   NEXT MD VISIT: 6 weeks   OBJECTIVE:   DIAGNOSTIC FINDINGS: X-ray: Left knee Degenerative changes. No acute osseous abnormalities  Right knee:  Degenerative changes. No acute osseous abnormalities.    COGNITION: Overall cognitive status: Within functional limits for tasks assessed     SENSATION: WFL  SLS Rt x 10, Lt x2 seconds   MUSCLE LENGTH: Hamstring flexibility limited by 25% bilaterally  POSTURE: rounded shoulders and forward head  PALPATION: Diffuse palpable tenderness in bil quads, and with patellar mobs/compression   LOWER EXTREMITY ROM:  Knee extension limited   LOWER EXTREMITY MMT:  MMT Right eval Left eval  Hip flexion 4 4  Hip extension 4+ 4+  Hip abduction 4 4  Hip adduction    Hip internal rotation    Hip external rotation    Knee flexion 4+ 4+  Knee extension 4+ 4+  Ankle dorsiflexion 5 5  Ankle plantarflexion    Ankle inversion    Ankle eversion     (Blank rows = not tested)   FUNCTIONAL TESTS:  5/14/245x sit to stand: 17.32 seconds Berg Balance Test: 52/56  GAIT: Distance walked: 100 Assistive device utilized: None Level of assistance: Complete Independence Comments: bil knee flexion    TODAY'S TREATMENT:     DATE: 03/30/23 NuStep: Level 4 x 8 minutes-PT present to discuss progress Standing gastroc stretch 3x20 seconds  Figure 4 3x20 seconds Rt and Lt seated  Sit to stand-seated on pad x5- Lt knee pain with this  Standing hip extension and abduction with red band x10 each bil Airex balance beam: sidestepping and tandem line walk in // bars- min to no UE support Hurdles: step over step forward with added cobble blocks Hamstring stretch seated   DATE: 03/25/23 NuStep: Level 5 x 6 minutes-PT present to discuss progress Figure 4 3x20 seconds Rt and Lt seated  Sit to stand-seated on pad x5- Lt knee pain with this  Standing hip extension and abduction with red band x10 each bil Airex balance beam: sidestepping and tandem line walk in // bars- min to no UE support Hurdles: step over step forward with added cobble blocks 6" step-ups with focus on quad activation, 6" step downs Hamstring stretch with power plate 1O10 seconds   Trigger Point Dry-Needling  Treatment instructions: Expect mild to moderate muscle soreness. S/S of pneumothorax if dry needled over a lung  field, and to seek immediate medical attention should they occur. Patient verbalized understanding of these instructions and education.  Patient Consent Given: Yes Education handout provided: Previously provided Muscles treated: Lt gluteals Treatment response/outcome: Utilized skilled palpation to identify trigger points.  During dry needling able to palpate muscle twitch and muscle elongation  Addaday to Lt gluteals after needling  Skilled palpation and monitoring by PT during dry needling  DATE: 03/23/23 NuStep: Level 4x 6 minutes-PT present to discuss progress Seated hamstring stretch: 3x20 seconds  Sit to stand-seated on pad x5- knee pain with this  Standing rockerboard: x3 minutes  Hurdles: step over step forward and sidestepping  Tandem stance: 20 seconds x3 each Long arc quads: 5" hold x10 each 6" step-ups with focus on quad activation, 6" step downs Hamstring  stretch with power plate 9U04 seconds    PATIENT EDUCATION:  Education details: Access Code: [email or web address] Person educated: Patient Education method: Explanation, Demonstration, and Handouts Education comprehension: verbalized understanding and returned demonstration  HOME EXERCISE PROGRAM: Access Code: [email or web address] URL: [email or web address] Date: 03/16/2023 Prepared by: Tresa Endo  Exercises - Sit to Stand Without Arm Support  - 2 x daily - 7 x weekly - 2 sets - 5 reps - Seated Hamstring Stretch  - 3 x daily - 7 x weekly - 1 sets - 3 reps - 20 hold - Gastroc Stretch on Wall  - 3 x daily - 7 x weekly - 1 sets - 3 reps - 20 hold - Standing Tandem Balance with Counter Support  - 2 x daily - 7 x weekly - 1 sets - 5 reps - 10-20 sec hold  ASSESSMENT:  CLINICAL IMPRESSION: Pt arrived with 3/10 bil knee pain.  Lt gluteal pain has resolved.  Pt with increased knee pain with sit to stand today and increased UE support required.  PT present to provide verbal and tactile cues.  PT present to monitor for pain and technique.  Patient will benefit from skilled PT to address the below impairments and improve overall function.   OBJECTIVE IMPAIRMENTS: Abnormal gait, decreased activity tolerance, decreased balance, decreased endurance, difficulty walking, decreased strength, decreased safety awareness, increased muscle spasms, impaired flexibility, improper body mechanics, and pain.   ACTIVITY LIMITATIONS: carrying, squatting, stairs, transfers, and locomotion level  PARTICIPATION LIMITATIONS: community activity, occupation, and yard work  PERSONAL FACTORS: Age, Time since onset of injury/illness/exacerbation, and 1 comorbidity: OA  are also affecting patient's functional outcome.   REHAB POTENTIAL: Good  CLINICAL DECISION MAKING: Evolving/moderate complexity  EVALUATION COMPLEXITY: Moderate   GOALS: Goals reviewed with patient? Yes  SHORT TERM GOALS: Target date: 04/13/2023   Be independent in initial HEP Baseline: Goal status: MET  2.  Perform 5x sit to stand in < 14 seconds without UE  support to reduce falls risk Baseline: 17.3  Goal status: INITIAL  3.  Report > or = to 30% reduction in Lt knee pain with negotiating steps  Baseline:  Goal status: INITIAL  LONG TERM GOALS: Target date: 05/14/23  Be independent in advanced HEP Baseline:  Goal status: INITIAL  2.  Perform 5x sit to stand in < or = to 12 seconds to reduce falls risk  Baseline: 17.3 Goal status: INITIAL  3.  Report  or = to 60% reduction in Lt knee pain with negotiating steps  Baseline: 7/10 Goal status: INITIAL  4.  Improve bil hip and knee strength to 4+/5 to 5/5 to improve functional mobility Baseline:  Goal status: INITIAL  5.  Improve Berg Balance Test to > or = to 55/56 to reduce falls risk  Baseline: 52/56 Goal status: INITIAL PLAN:  PT FREQUENCY: 2x/week  PT DURATION: 8 weeks  PLANNED INTERVENTIONS: Therapeutic exercises, Therapeutic activity, Neuromuscular re-education, Balance training, Gait training,  Patient/Family education, Self Care, Joint mobilization, Stair training, Vestibular training, Aquatic Therapy, Dry Needling, Cryotherapy, Moist heat, Manual therapy, and Re-evaluation  PLAN FOR NEXT SESSION: Quad and glute strength, balance tasks, hip and knee flexibility. Test 5x sit to stand, needle gluteals again if helpful   Lorrene Reid, PT 03/30/23 8:48 AM

## 2023-04-01 ENCOUNTER — Ambulatory Visit: Payer: Medicare Other

## 2023-04-01 DIAGNOSIS — M6281 Muscle weakness (generalized): Secondary | ICD-10-CM | POA: Diagnosis not present

## 2023-04-01 DIAGNOSIS — G8929 Other chronic pain: Secondary | ICD-10-CM

## 2023-04-01 DIAGNOSIS — R2689 Other abnormalities of gait and mobility: Secondary | ICD-10-CM

## 2023-04-01 DIAGNOSIS — M25562 Pain in left knee: Secondary | ICD-10-CM | POA: Diagnosis not present

## 2023-04-01 DIAGNOSIS — M170 Bilateral primary osteoarthritis of knee: Secondary | ICD-10-CM | POA: Diagnosis not present

## 2023-04-01 NOTE — Therapy (Signed)
OUTPATIENT PHYSICAL THERAPY TREATMENT   Patient Name: Cory Barnett MRN: [id number] DOB:[birth year], 77 y.o., male Today's Date: 04/01/2023  END OF SESSION:  PT End of Session - 04/01/23 0846     Visit Number 5    Date for PT Re-Evaluation 05/14/23    Authorization Type Medicare (KX at 15)    Progress Note Due on Visit 10    PT Start Time 0800    PT Stop Time 0841    PT Time Calculation (min) 41 min    Activity Tolerance Patient tolerated treatment well    Behavior During Therapy [facility] for tasks assessed/performed                 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   IFG (impaired fasting glucose) 01/2016   Pre-diabetes    RBBB (right bundle branch block)    Rheumatic fever    age 67 or 58, in hospital for a month, out of school for a yr   Seasonal allergies    Squamous cell carcinoma    Testicular hypofunction    Past Surgical History:  Procedure Laterality Date   EYE SURGERY     right eye  cataract surgery with lens implant   NASAL SEPTUM SURGERY     NOSE SURGERY     REVERSE SHOULDER ARTHROPLASTY Right 01/11/2020   Procedure: REVERSE SHOULDER ARTHROPLASTY;  Surgeon: Jones Broom, MD;  Location: [facility];  Service: Orthopedics;  Laterality: Right;   SHOULDER ARTHROSCOPY WITH SUBACROMIAL DECOMPRESSION Right 08/07/2019   Procedure: SHOULDER ARTHROSCOPY WITH SUBACROMIAL DECOMPRESSION;  Surgeon: Jones Broom, MD;  Location: [facility];  Service: Orthopedics;  Laterality: Right;   SKIN CANCER EXCISION     basal and squamous cell    TONSILLECTOMY     VASECTOMY     WISDOM TOOTH EXTRACTION     Patient Active Problem List   Diagnosis Date Noted   Bursitis of both hips 02/23/2023   Degenerative arthritis of knee, bilateral 11/08/2022    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   Left knee pain 08/05/2020   Piriformis syndrome of left side 08/18/2019   Rotator cuff tear, right 05/29/2019   Closed fracture of distal clavicle 05/15/2019   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    PCP: Rodrigo Ran, MD   REFERRING PROVIDER: Antoine Primas, MD  REFERRING DIAG: M17.0 (ICD-10-CM) - Primary osteoarthritis of both knees   THERAPY DIAG:  Chronic pain of left knee  Other abnormalities of gait and mobility  Muscle weakness (generalized)  Rationale for Evaluation and Treatment: Rehabilitation  ONSET DATE: chronic with recent falls   SUBJECTIVE:   SUBJECTIVE STATEMENT: I am having pain in my Lt gluteals. Not sure what I did.    PERTINENT HISTORY: Anxiety, HTN, reverse shoulder replacement (2021) PAIN:  Are you having pain? Yes: NPRS scale: 0.5/10 Lt glute, 3/10 bil knee Pain location: Lt>Rt knee  Pain description: sore, intense  Aggravating factors: going up/down steps  Relieving factors: advil   PRECAUTIONS: Fall  WEIGHT BEARING RESTRICTIONS: No  FALLS:  Has patient fallen in last 6 months? Yes. Number of falls tripped going up steps and caught foot   LIVING ENVIRONMENT: Lives with: lives with their  spouse Lives in: House/apartment Stairs: Yes: External: 3 steps; on right going up Has following equipment at home: None  OCCUPATION: retired  PLOF: Independent, Vocation/Vocational requirements: desk work, walking-retired but small amount of work, and Leisure: walking for Psychologist, sport and exercise every Friday for strength training.   PATIENT GOALS: reduce pain, improve balance   NEXT MD VISIT: 6 weeks   OBJECTIVE:   DIAGNOSTIC FINDINGS: X-ray: Left knee Degenerative changes. No acute osseous abnormalities  Right knee:  Degenerative changes. No acute osseous abnormalities.    COGNITION: Overall cognitive status: Within functional limits for tasks assessed     SENSATION: WFL  SLS Rt x 10, Lt x2 seconds   MUSCLE LENGTH: Hamstring flexibility limited by 25% bilaterally  POSTURE: rounded shoulders and forward head  PALPATION: Diffuse palpable tenderness in bil quads, and with patellar mobs/compression   LOWER EXTREMITY ROM:  Knee extension limited   LOWER EXTREMITY MMT:  MMT Right eval Left eval  Hip flexion 4 4  Hip extension 4+ 4+  Hip abduction 4 4  Hip adduction    Hip internal rotation    Hip external rotation    Knee flexion 4+ 4+  Knee extension 4+ 4+  Ankle dorsiflexion 5 5  Ankle plantarflexion    Ankle inversion    Ankle eversion     (Blank rows = not tested)   FUNCTIONAL TESTS:  5/14/245x sit to stand: 17.32 seconds Berg Balance Test: 52/56 04/01/23: 18 seconds with improved   GAIT: Distance walked: 100 Assistive device utilized: None Level of assistance: Complete Independence Comments: bil knee flexion    TODAY'S TREATMENT:     DATE: 04/01/23 NuStep: Level 3 x 10 minutes-PT present to discuss progress Standing gastroc stretch 3x20 seconds  Deadlift with 10# kettlebell: 2x10 Figure 4 3x20 seconds Rt and Lt seated  Sit to stand-seated on pad x5-pain with this Standing hip extension and abduction with red band x10 each bil Heel raises on balance pad 2x10 bil  Hurdles: step over step forward with added cobble blocks Hamstring stretch seated   DATE: 03/30/23 NuStep: Level 4 x 8 minutes-PT present to discuss progress Standing gastroc stretch 3x20 seconds  Figure 4 3x20 seconds Rt and Lt seated  Sit to stand-seated on pad x5- Lt knee pain with this  Standing hip extension and abduction with red band x10 each bil Airex balance beam: sidestepping and tandem line walk in // bars- min to no UE support Hurdles: step over step forward with added cobble blocks Hamstring stretch seated   DATE: 03/25/23 NuStep: Level 5 x 6 minutes-PT present to discuss progress Figure 4 3x20 seconds Rt and Lt seated  Sit to stand-seated on pad x5- Lt knee pain with this   Standing hip extension and abduction with red band x10 each bil Airex balance beam: sidestepping and tandem line walk in // bars- min to no UE support Hurdles: step over step forward with added cobble blocks 6" step-ups with focus on quad activation, 6" step downs Hamstring stretch with power plate 8U13 seconds   Trigger Point Dry-Needling  Treatment instructions: Expect mild to moderate muscle soreness. S/S of pneumothorax if dry needled over a lung field, and to seek immediate medical attention should they occur. Patient verbalized understanding of these instructions and education.  Patient Consent Given: Yes Education handout provided: Previously provided Muscles treated: Lt gluteals Treatment response/outcome: Utilized skilled palpation to identify trigger points.  During dry needling able to palpate muscle twitch and muscle elongation  Addaday to  Lt gluteals after needling  Skilled palpation and monitoring by PT during dry needling   PATIENT EDUCATION:  Education details: Access Code: [email or web address] Person educated: Patient Education method: Explanation, Demonstration, and Handouts Education comprehension: verbalized understanding and returned demonstration  HOME EXERCISE PROGRAM: Access Code: [email or web address] URL: [email or web address] Date: 04/01/2023 Prepared by: Tresa Endo  Exercises - Sit to Stand Without Arm Support  - 2 x daily - 7 x weekly - 2 sets - 5 reps - Seated Hamstring Stretch  - 3 x daily - 7 x weekly - 1 sets - 3 reps - 20 hold - Gastroc Stretch on Wall  - 3 x daily - 7 x weekly - 1 sets - 3 reps - 20 hold - Standing Tandem Balance with Counter Support  - 2 x daily - 7 x weekly - 1 sets - 5 reps - 10-20 sec hold - Standing Hip Abduction  - 2 x daily - 7 x weekly - 2 sets - 10 reps - Standing Hip Extension  - 2 x daily - 7 x weekly - 2 sets - 10 reps - Half Deadlift with Kettlebell  - 2 x daily - 7 x weekly - 2 sets - 10 reps  ASSESSMENT:  CLINICAL  IMPRESSION: Pt with reduced pain in the knees today.  We discussed aquatics and pt is going to attend a few sessions in the future.  He will finish on land next week and will receive instruction in aquatic exercises to perform.  Pt with improved technique and control with sit to stand but this is painful to his Lt knee. PT present to provide verbal and tactile cues.  PT present to monitor for pain and technique. Patient will benefit from skilled PT to address the below impairments and improve overall function.   OBJECTIVE IMPAIRMENTS: Abnormal gait, decreased activity tolerance, decreased balance, decreased endurance, difficulty walking, decreased strength, decreased safety awareness, increased muscle spasms, impaired flexibility, improper body mechanics, and pain.   ACTIVITY LIMITATIONS: carrying, squatting, stairs, transfers, and locomotion level  PARTICIPATION LIMITATIONS: community activity, occupation, and yard work  PERSONAL FACTORS: Age, Time since onset of injury/illness/exacerbation, and 1 comorbidity: OA  are also affecting patient's functional outcome.   REHAB POTENTIAL: Good  CLINICAL DECISION MAKING: Evolving/moderate complexity  EVALUATION COMPLEXITY: Moderate   GOALS: Goals reviewed with patient? Yes  SHORT TERM GOALS: Target date: 04/13/2023   Be independent in initial HEP Baseline: Goal status: MET  2.  Perform 5x sit to stand in < 14 seconds without UE  support to reduce falls risk Baseline: 18 with improved technique (04/01/23)  Goal status: IN PROGRESS  3.  Report > or = to 30% reduction in Lt knee pain with negotiating steps  Baseline: no change in pain (04/01/23) Goal status: IN PROGRESS  LONG TERM GOALS: Target date: 05/14/23  Be independent in advanced HEP Baseline:  Goal status: INITIAL  2.  Perform 5x sit to stand in < or = to 12 seconds to reduce falls risk  Baseline: 17.3 Goal status: INITIAL  3.  Report  or = to 60% reduction in Lt knee pain with  negotiating steps  Baseline: 7/10 Goal status: INITIAL  4.  Improve bil hip and knee strength to 4+/5 to 5/5 to improve functional mobility Baseline:  Goal status: INITIAL  5.  Improve Berg Balance Test to > or = to 55/56 to reduce falls risk  Baseline: 52/56 Goal status: INITIAL PLAN:  PT FREQUENCY: 2x/week  PT DURATION: 8 weeks  PLANNED INTERVENTIONS:  Therapeutic exercises, Therapeutic activity, Neuromuscular re-education, Balance training, Gait training, Patient/Family education, Self Care, Joint mobilization, Stair training, Vestibular training, Aquatic Therapy, Dry Needling, Cryotherapy, Moist heat, Manual therapy, and Re-evaluation  PLAN FOR NEXT SESSION: Quad and glute strength, balance tasks, hip and knee flexibility.   Lorrene Reid, PT 04/01/23 8:48 AM

## 2023-04-06 ENCOUNTER — Ambulatory Visit: Payer: Medicare Other | Attending: Family Medicine

## 2023-04-06 DIAGNOSIS — M6281 Muscle weakness (generalized): Secondary | ICD-10-CM | POA: Diagnosis not present

## 2023-04-06 DIAGNOSIS — R2689 Other abnormalities of gait and mobility: Secondary | ICD-10-CM | POA: Insufficient documentation

## 2023-04-06 DIAGNOSIS — M25562 Pain in left knee: Secondary | ICD-10-CM | POA: Insufficient documentation

## 2023-04-06 DIAGNOSIS — G8929 Other chronic pain: Secondary | ICD-10-CM | POA: Insufficient documentation

## 2023-04-06 NOTE — Therapy (Addendum)
OUTPATIENT PHYSICAL THERAPY TREATMENT   Patient Name: Cory Barnett MRN: [id number] DOB:08/24/1946, 77 y.o., male Today's Date: 04/06/2023  END OF SESSION:  PT End of Session - 04/06/23 0842     Visit Number 6    Date for PT Re-Evaluation 05/14/23    Authorization Type Medicare (KX at 15)    Progress Note Due on Visit 10    PT Start Time 0801    PT Stop Time 0842    PT Time Calculation (min) 41 min    Activity Tolerance Patient tolerated treatment well    Behavior During Therapy [facility] for tasks assessed/performed                  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   IFG (impaired fasting glucose) 01/2016   Pre-diabetes    RBBB (right bundle branch block)    Rheumatic fever    age 8 or 4, in hospital for a month, out of school for a yr   Seasonal allergies    Squamous cell carcinoma    Testicular hypofunction    Past Surgical History:  Procedure Laterality Date   EYE SURGERY     right eye  cataract surgery with lens implant   NASAL SEPTUM SURGERY     NOSE SURGERY     REVERSE SHOULDER ARTHROPLASTY Right 01/11/2020   Procedure: REVERSE SHOULDER ARTHROPLASTY;  Surgeon: Jones Broom, MD;  Location: [facility];  Service: Orthopedics;  Laterality: Right;   SHOULDER ARTHROSCOPY WITH SUBACROMIAL DECOMPRESSION Right 08/07/2019   Procedure: SHOULDER ARTHROSCOPY WITH SUBACROMIAL DECOMPRESSION;  Surgeon: Jones Broom, MD;  Location: [facility];  Service: Orthopedics;  Laterality: Right;   SKIN CANCER EXCISION     basal and squamous cell    TONSILLECTOMY     VASECTOMY     WISDOM TOOTH EXTRACTION     Patient Active Problem List   Diagnosis Date Noted   Bursitis of both hips 02/23/2023   Degenerative arthritis of knee, bilateral 11/08/2022    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   Left knee pain 08/05/2020   Piriformis syndrome of left side 08/18/2019   Rotator cuff tear, right 05/29/2019   Closed fracture of distal clavicle 05/15/2019   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    PCP: Rodrigo Ran, MD   REFERRING PROVIDER: Antoine Primas, MD  REFERRING DIAG: M17.0 (ICD-10-CM) - Primary osteoarthritis of both knees   THERAPY DIAG:  Chronic pain of left knee  Other abnormalities of gait and mobility  Muscle weakness (generalized)  Rationale for Evaluation and Treatment: Rehabilitation  ONSET DATE: chronic with recent falls   SUBJECTIVE:   SUBJECTIVE STATEMENT: I am having pain in my Lt gluteals. Not sure what I did.    PERTINENT HISTORY: Anxiety, HTN, reverse shoulder replacement (2021) PAIN:  Are you having pain? Yes: NPRS scale:2/10 Pain location: Lt>Rt knee  Pain description: sore, intense  Aggravating factors: going up/down steps  Relieving factors: advil   PRECAUTIONS: Fall  WEIGHT BEARING RESTRICTIONS: No  FALLS:  Has patient fallen in last 6 months? Yes. Number of falls tripped going up steps and caught foot   LIVING ENVIRONMENT: Lives with: lives with their spouse Lives in: House/apartment Stairs:  Yes: External: 3 steps; on right going up Has following equipment at home: None  OCCUPATION: retired  PLOF: Independent, Vocation/Vocational requirements: desk work, walking-retired but small amount of work, and Leisure: walking for Psychologist, sport and exercise every Friday for strength training.   PATIENT GOALS: reduce pain, improve balance   NEXT MD VISIT: 6 weeks   OBJECTIVE:   DIAGNOSTIC FINDINGS: X-ray: Left knee Degenerative changes. No acute osseous abnormalities  Right knee:  Degenerative changes. No acute osseous abnormalities.    COGNITION: Overall cognitive status: Within functional limits for tasks assessed     SENSATION: WFL  SLS Rt x 10, Lt x2 seconds  MUSCLE LENGTH: Hamstring  flexibility limited by 25% bilaterally  POSTURE: rounded shoulders and forward head  PALPATION: Diffuse palpable tenderness in bil quads, and with patellar mobs/compression   LOWER EXTREMITY ROM:  Knee extension limited   LOWER EXTREMITY MMT:  MMT Right eval Left eval  Hip flexion 4 4  Hip extension 4+ 4+  Hip abduction 4 4  Hip adduction    Hip internal rotation    Hip external rotation    Knee flexion 4+ 4+  Knee extension 4+ 4+  Ankle dorsiflexion 5 5  Ankle plantarflexion    Ankle inversion    Ankle eversion     (Blank rows = not tested)   FUNCTIONAL TESTS:  5/14/245x sit to stand: 17.32 seconds Berg Balance Test: 52/56 04/01/23: 18 seconds with improved   GAIT: Distance walked: 100 Assistive device utilized: None Level of assistance: Complete Independence Comments: bil knee flexion    TODAY'S TREATMENT:     DATE: 04/06/23 NuStep: Level 3 x 10 minutes-PT present to discuss progress Standing gastroc stretch 3x20 seconds  Deadlift with 10# kettlebell: 2x10 Figure 4 3x20 seconds Rt and Lt seated  Sit to stand-seated on pad 2x10-less pain today Standing hip extension and abduction with red band x10 each bil Airex balance beam: sidestepping and tandem line walk in // bars Hurdles: step over step forward with added cobble blocks Hamstring stretch standing with foot on power plate  DATE: 11/20/12 NuStep: Level 3 x 10 minutes-PT present to discuss progress Standing gastroc stretch 3x20 seconds  Deadlift with 10# kettlebell: 2x10 Figure 4 3x20 seconds Rt and Lt seated  Sit to stand-seated on pad x5-pain with this Standing hip extension and abduction with red band x10 each bil Heel raises on balance pad 2x10 bil  Hurdles: step over step forward with added cobble blocks Hamstring stretch seated  DATE: 04/01/23 NuStep: Level 3 x 10 minutes-PT present to discuss progress Standing gastroc stretch 3x20 seconds  Deadlift with 10# kettlebell: 2x10 Figure 4 3x20  seconds Rt and Lt seated  Sit to stand-seated on pad x5-pain with this Standing hip extension and abduction with red band x10 each bil Heel raises on balance pad 2x10 bil  Hurdles: step over step forward with added cobble blocks Hamstring stretch seated   PATIENT EDUCATION:  Education details: Access Code: [email or web address] Person educated: Patient Education method: Programmer, multimedia, Facilities manager, and Handouts Education comprehension: verbalized understanding and returned demonstration  HOME EXERCISE PROGRAM: Access Code: [email or web address] URL: [email or web address] Date: 04/01/2023 Prepared by: Tresa Endo  Exercises - Sit to Stand Without Arm Support  - 2 x daily - 7 x weekly - 2 sets - 5 reps - Seated Hamstring Stretch  - 3 x daily - 7 x weekly - 1 sets - 3 reps - 20 hold - Gastroc Stretch on Wall  - 3 x daily - 7 x weekly -  1 sets - 3 reps - 20 hold - Standing Tandem Balance with Counter Support  - 2 x daily - 7 x weekly - 1 sets - 5 reps - 10-20 sec hold - Standing Hip Abduction  - 2 x daily - 7 x weekly - 2 sets - 10 reps - Standing Hip Extension  - 2 x daily - 7 x weekly - 2 sets - 10 reps - Half Deadlift with Kettlebell  - 2 x daily - 7 x weekly - 2 sets - 10 reps  ASSESSMENT:  CLINICAL IMPRESSION: Pt reports less pain overall today.  He is working on strength and flexibility exercises at home.  [place]  PT present to provide verbal and tactile cues throughout session.  He does well with balance tasks and doesn't require guarding today.   PT present to monitor for pain and technique. Patient will benefit from skilled PT to address the below impairments and improve overall function.   OBJECTIVE IMPAIRMENTS: Abnormal gait, decreased activity tolerance, decreased balance, decreased endurance, difficulty walking, decreased strength, decreased safety awareness, increased muscle spasms, impaired  flexibility, improper body mechanics, and pain.   ACTIVITY LIMITATIONS: carrying, squatting, stairs, transfers, and locomotion level  PARTICIPATION LIMITATIONS: community activity, occupation, and yard work  PERSONAL FACTORS: Age, Time since onset of injury/illness/exacerbation, and 1 comorbidity: OA  are also affecting patient's functional outcome.   REHAB POTENTIAL: Good  CLINICAL DECISION MAKING: Evolving/moderate complexity  EVALUATION COMPLEXITY: Moderate   GOALS: Goals reviewed with patient? Yes  SHORT TERM GOALS: Target date: 04/13/2023   Be independent in initial HEP Baseline: Goal status: MET  2.  Perform 5x sit to stand in < 14 seconds without UE  support to reduce falls risk Baseline: 18 with improved technique (04/01/23)  Goal status: IN PROGRESS  3.  Report > or = to 30% reduction in Lt knee pain with negotiating steps  Baseline: no change in pain (04/01/23) Goal status: IN PROGRESS  LONG TERM GOALS: Target date: 05/14/23  Be independent in advanced HEP Baseline:  Goal status: INITIAL  2.  Perform 5x sit to stand in < or = to 12 seconds to reduce falls risk  Baseline: 17.3 Goal status: INITIAL  3.  Report  or = to 60% reduction in Lt knee pain with negotiating steps  Baseline: 7/10 Goal status: INITIAL  4.  Improve bil hip and knee strength to 4+/5 to 5/5 to improve functional mobility Baseline:  Goal status: INITIAL  5.  Improve Berg Balance Test to > or = to 55/56 to reduce falls risk  Baseline: 52/56 Goal status: INITIAL PLAN:  PT FREQUENCY: 2x/week  PT DURATION: 8 weeks  PLANNED INTERVENTIONS: Therapeutic exercises, Therapeutic activity, Neuromuscular re-education, Balance training, Gait training, Patient/Family education, Self Care, Joint mobilization, Stair training, Vestibular training, Aquatic Therapy, Dry Needling, Cryotherapy, Moist heat, Manual therapy, and Re-evaluation  PLAN FOR NEXT SESSION: 1 more land visit and then aquatics.    Lorrene Reid, PT 04/06/23 9:47 AM

## 2023-04-06 NOTE — Patient Instructions (Signed)
     [facility] Welcome to [facility]! Here you will find all the information you will need regarding your pool therapy. If you have further questions at any time, please call our office at [phone]. After completing your initial evaluation in the [facility], you may be eligible to complete a portion of your therapy in the pool. A typical week of therapy will consist of 1-2 typical physical therapy visits at our [facility] location and an additional session of therapy in the pool located at the [facility] at [address]. [address], [place] [address]. The phone number at the pool site is [phone]. Please call this number if you are running late or need to cancel your appointment.  Aquatic therapy will be offered on Wednesday mornings and Friday afternoons. Each session will last approximately 45 minutes. All scheduling and payments for aquatic therapy sessions, including cancelations, will be done through our [facility] location.  To be eligible for aquatic therapy, these criteria must be met: You must be able to independently change in the locker room and get to the pool deck. A caregiver can come with you to help if needed. There are benches for a caregiver to sit on next to the pool. No one with an open wound is permitted in the pool.  Handicap parking is available in the front and there is a drop off option for even closer accessibility. Please arrive 15 minutes prior to your appointment to prepare for your pool session. You must sign in at the front desk upon your arrival. Please be sure to attend to any toileting needs prior to entering the pool. Locker rooms for changing are available.  There is direct access to the pool deck from the locker room. You can lock your belongings in a locker or bring them with you poolside. Your therapist will greet you on the pool deck. There may be other swimmers in the pool at the  same time but your session is one-on-one with the therapist.   

## 2023-04-08 ENCOUNTER — Ambulatory Visit: Payer: Medicare Other

## 2023-04-08 DIAGNOSIS — R2689 Other abnormalities of gait and mobility: Secondary | ICD-10-CM

## 2023-04-08 DIAGNOSIS — M6281 Muscle weakness (generalized): Secondary | ICD-10-CM | POA: Diagnosis not present

## 2023-04-08 DIAGNOSIS — M25562 Pain in left knee: Secondary | ICD-10-CM | POA: Diagnosis not present

## 2023-04-08 DIAGNOSIS — G8929 Other chronic pain: Secondary | ICD-10-CM | POA: Diagnosis not present

## 2023-04-08 NOTE — Therapy (Signed)
OUTPATIENT PHYSICAL THERAPY TREATMENT   Patient Name: Cory Barnett MRN: [id number] DOB:Oct 25, 1946, 77 y.o., male Today's Date: 04/08/2023  END OF SESSION:  PT End of Session - 04/08/23 0851     Visit Number 7    Date for PT Re-Evaluation 05/14/23    Authorization Type Medicare (KX at 15)    Progress Note Due on Visit 10    PT Start Time 0803    PT Stop Time 0845    PT Time Calculation (min) 42 min    Activity Tolerance Patient tolerated treatment well    Behavior During Therapy [facility] for tasks assessed/performed                   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   IFG (impaired fasting glucose) 01/2016   Pre-diabetes    RBBB (right bundle branch block)    Rheumatic fever    age 1 or 67, in hospital for a month, out of school for a yr   Seasonal allergies    Squamous cell carcinoma    Testicular hypofunction    Past Surgical History:  Procedure Laterality Date   EYE SURGERY     right eye  cataract surgery with lens implant   NASAL SEPTUM SURGERY     NOSE SURGERY     REVERSE SHOULDER ARTHROPLASTY Right 01/11/2020   Procedure: REVERSE SHOULDER ARTHROPLASTY;  Surgeon: Jones Broom, MD;  Location: [facility];  Service: Orthopedics;  Laterality: Right;   SHOULDER ARTHROSCOPY WITH SUBACROMIAL DECOMPRESSION Right 08/07/2019   Procedure: SHOULDER ARTHROSCOPY WITH SUBACROMIAL DECOMPRESSION;  Surgeon: Jones Broom, MD;  Location: [facility];  Service: Orthopedics;  Laterality: Right;   SKIN CANCER EXCISION     basal and squamous cell    TONSILLECTOMY     VASECTOMY     WISDOM TOOTH EXTRACTION     Patient Active Problem List   Diagnosis Date Noted   Bursitis of both hips 02/23/2023   Degenerative arthritis of knee, bilateral 11/08/2022    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   Left knee pain 08/05/2020   Piriformis syndrome of left side 08/18/2019   Rotator cuff tear, right 05/29/2019   Closed fracture of distal clavicle 05/15/2019   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    PCP: Rodrigo Ran, MD   REFERRING PROVIDER: Antoine Primas, MD  REFERRING DIAG: M17.0 (ICD-10-CM) - Primary osteoarthritis of both knees   THERAPY DIAG:  Chronic pain of left knee  Muscle weakness (generalized)  Other abnormalities of gait and mobility  Rationale for Evaluation and Treatment: Rehabilitation  ONSET DATE: chronic with recent falls   SUBJECTIVE:   SUBJECTIVE STATEMENT: I am feeling ok this morning.    PERTINENT HISTORY: Anxiety, HTN, reverse shoulder replacement (2021) PAIN:  Are you having pain? Yes: NPRS scale:2/10 Pain location: Lt>Rt knee  Pain description: sore, intense  Aggravating factors: going up/down steps  Relieving factors: advil   PRECAUTIONS: Fall  WEIGHT BEARING RESTRICTIONS: No  FALLS:  Has patient fallen in last 6 months? Yes. Number of falls tripped going up steps and caught foot   LIVING ENVIRONMENT: Lives with: lives with their spouse Lives in: House/apartment Stairs: Yes: External: 3 steps; on right  going up Has following equipment at home: None  OCCUPATION: retired  PLOF: Independent, Vocation/Vocational requirements: desk work, walking-retired but small amount of work, and Leisure: walking for Psychologist, sport and exercise every Friday for strength training.   PATIENT GOALS: reduce pain, improve balance   NEXT MD VISIT: 6 weeks   OBJECTIVE:   DIAGNOSTIC FINDINGS: X-ray: Left knee Degenerative changes. No acute osseous abnormalities  Right knee:  Degenerative changes. No acute osseous abnormalities.    COGNITION: Overall cognitive status: Within functional limits for tasks assessed     SENSATION: WFL  SLS Rt x 10, Lt x2 seconds  MUSCLE LENGTH: Hamstring flexibility limited by 25%  bilaterally  POSTURE: rounded shoulders and forward head  PALPATION: Diffuse palpable tenderness in bil quads, and with patellar mobs/compression   LOWER EXTREMITY ROM:  Knee extension limited   LOWER EXTREMITY MMT:  MMT Right eval Left eval  Hip flexion 4 4  Hip extension 4+ 4+  Hip abduction 4 4  Hip adduction    Hip internal rotation    Hip external rotation    Knee flexion 4+ 4+  Knee extension 4+ 4+  Ankle dorsiflexion 5 5  Ankle plantarflexion    Ankle inversion    Ankle eversion     (Blank rows = not tested)   FUNCTIONAL TESTS:  5/14/245x sit to stand: 17.32 seconds Berg Balance Test: 52/56 04/01/23: 18 seconds with improved  04/08/23: 5x sit to stand: 16.2 then 13.3 seconds  Berg Balance test: 53/56  GAIT: Distance walked: 100 Assistive device utilized: None Level of assistance: Complete Independence Comments: bil knee flexion    TODAY'S TREATMENT:     DATE: 04/08/23 NuStep: Level 3 x 10 minutes-PT present to discuss progress Step practice with use of walking stick, gait around clinic with walking stick with instruction for correct use Standing gastroc stretch 3x20 seconds  Deadlift with 10# kettlebell: 2x10 Figure 4 3x20 seconds Rt and Lt seated  Sit to stand-seated on pad 2x10-less pain today Verbal review of all HEP with pt and he agreed to compliance.   DATE: 04/06/23 NuStep: Level 3 x 10 minutes-PT present to discuss progress Standing gastroc stretch 3x20 seconds  Deadlift with 10# kettlebell: 2x10 Figure 4 3x20 seconds Rt and Lt seated  Sit to stand-seated on pad 2x10-less pain today Standing hip extension and abduction with red band x10 each bil Airex balance beam: sidestepping and tandem line walk in // bars Hurdles: step over step forward with added cobble blocks Hamstring stretch standing with foot on power plate  DATE: 1/61/09 NuStep: Level 3 x 10 minutes-PT present to discuss progress Standing gastroc stretch 3x20 seconds  Deadlift  with 10# kettlebell: 2x10 Figure 4 3x20 seconds Rt and Lt seated  Sit to stand-seated on pad x5-pain with this Standing hip extension and abduction with red band x10 each bil Heel raises on balance pad 2x10 bil  Hurdles: step over step forward with added cobble blocks Hamstring stretch seated    PATIENT EDUCATION:  Education details: Access Code: [email or web address] Person educated: Patient Education method: Programmer, multimedia, Facilities manager, and Handouts Education comprehension: verbalized understanding and returned demonstration  HOME EXERCISE PROGRAM: Access Code: [email or web address] URL: [email or web address] Date: 04/01/2023 Prepared by: Tresa Endo  Exercises - Sit to Stand Without Arm Support  - 2 x daily - 7 x weekly - 2 sets - 5 reps - Seated Hamstring Stretch  - 3 x daily - 7 x weekly - 1 sets - 3 reps - 20 hold - Gastroc Stretch on Wall  -  3 x daily - 7 x weekly - 1 sets - 3 reps - 20 hold - Standing Tandem Balance with Counter Support  - 2 x daily - 7 x weekly - 1 sets - 5 reps - 10-20 sec hold - Standing Hip Abduction  - 2 x daily - 7 x weekly - 2 sets - 10 reps - Standing Hip Extension  - 2 x daily - 7 x weekly - 2 sets - 10 reps - Half Deadlift with Kettlebell  - 2 x daily - 7 x weekly - 2 sets - 10 reps  ASSESSMENT:  CLINICAL IMPRESSION: Pt continues to be complaint with HEP.  Pt brought his walking stick and PT adjusted and practiced steps and ambulation using this.   Overall improved technique and speed with sit to stand  indicating improved balance.  PT present to monitor for pain and technique. Pt will have 2-3 aquatics sessions and will D/C to HEP after. Patient will benefit from skilled PT to address the below impairments and improve overall function.   OBJECTIVE IMPAIRMENTS: Abnormal gait, decreased activity tolerance, decreased balance, decreased endurance, difficulty walking, decreased strength, decreased safety awareness, increased muscle spasms, impaired flexibility, improper  body mechanics, and pain.   ACTIVITY LIMITATIONS: carrying, squatting, stairs, transfers, and locomotion level  PARTICIPATION LIMITATIONS: community activity, occupation, and yard work  PERSONAL FACTORS: Age, Time since onset of injury/illness/exacerbation, and 1 comorbidity: OA  are also affecting patient's functional outcome.   REHAB POTENTIAL: Good  CLINICAL DECISION MAKING: Evolving/moderate complexity  EVALUATION COMPLEXITY: Moderate   GOALS: Goals reviewed with patient? Yes  SHORT TERM GOALS: Target date: 04/13/2023   Be independent in initial HEP Baseline: Goal status: MET  2.  Perform 5x sit to stand in < 14 seconds without UE  support to reduce falls risk Baseline: 18 with improved technique (04/01/23)  Goal status: IN PROGRESS  3.  Report > or = to 30% reduction in Lt knee pain with negotiating steps  Baseline: no change in pain (04/01/23) Goal status: NOT MET  LONG TERM GOALS: Target date: 05/14/23  Be independent in advanced HEP Baseline:  Goal status: INITIAL  2.  Perform 5x sit to stand in < or = to 12 seconds to reduce falls risk  Baseline: 13 (04/08/23) Goal status: NOT MET  3.  Report  or = to 60% reduction in Lt knee pain with negotiating steps  Baseline: 7/10 Goal status: INITIAL  4.  Improve bil hip and knee strength to 4+/5 to 5/5 to improve functional mobility Baseline:  Goal status: INITIAL  5.  Improve Berg Balance Test to > or = to 55/56 to reduce falls risk  Baseline: 53/56 (04/08/23) Goal status: NOT MET PLAN:  PT FREQUENCY: 2x/week  PT DURATION: 8 weeks  PLANNED INTERVENTIONS: Therapeutic exercises, Therapeutic activity, Neuromuscular re-education, Balance training, Gait training, Patient/Family education, Self Care, Joint mobilization, Stair training, Vestibular training, Aquatic Therapy, Dry Needling, Cryotherapy, Moist heat, Manual therapy, and Re-evaluation  PLAN FOR NEXT SESSION: 2-4 more sessions for aquatics with probable D/C to  HEP after this.   Lorrene Reid, PT 04/08/23 8:51 AM

## 2023-04-20 NOTE — Therapy (Unsigned)
OUTPATIENT PHYSICAL THERAPY TREATMENT   Patient Name: Cory Barnett MRN: [id number] DOB:[birth year], 77 y.o., male Today's Date: 04/21/2023  END OF SESSION:  PT End of Session - 04/21/23 1023     Visit Number 8    Date for PT Re-Evaluation 05/14/23    Authorization Type Medicare (KX at 15)    Progress Note Due on Visit 10    PT Start Time 0846    PT Stop Time 0930    PT Time Calculation (min) 44 min    Activity Tolerance Patient tolerated treatment well    Behavior During Therapy [facility] for tasks assessed/performed                    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   IFG (impaired fasting glucose) 01/2016   Pre-diabetes    RBBB (right bundle branch block)    Rheumatic fever    age 49 or 3, in hospital for a month, out of school for a yr   Seasonal allergies    Squamous cell carcinoma    Testicular hypofunction    Past Surgical History:  Procedure Laterality Date   EYE SURGERY     right eye  cataract surgery with lens implant   NASAL SEPTUM SURGERY     NOSE SURGERY     REVERSE SHOULDER ARTHROPLASTY Right 01/11/2020   Procedure: REVERSE SHOULDER ARTHROPLASTY;  Surgeon: Jones Broom, MD;  Location: [facility];  Service: Orthopedics;  Laterality: Right;   SHOULDER ARTHROSCOPY WITH SUBACROMIAL DECOMPRESSION Right 08/07/2019   Procedure: SHOULDER ARTHROSCOPY WITH SUBACROMIAL DECOMPRESSION;  Surgeon: Jones Broom, MD;  Location: [facility];  Service: Orthopedics;  Laterality: Right;   SKIN CANCER EXCISION     basal and squamous cell    TONSILLECTOMY     VASECTOMY     WISDOM TOOTH EXTRACTION     Patient Active Problem List   Diagnosis Date Noted   Bursitis of both hips 02/23/2023   Degenerative arthritis of knee, bilateral  11/08/2022   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   Left knee pain 08/05/2020   Piriformis syndrome of left side 08/18/2019   Rotator cuff tear, right 05/29/2019   Closed fracture of distal clavicle 05/15/2019   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    PCP: Rodrigo Ran, MD   REFERRING PROVIDER: Antoine Primas, MD  REFERRING DIAG: M17.0 (ICD-10-CM) - Primary osteoarthritis of both knees   THERAPY DIAG:  Chronic pain of left knee  Muscle weakness (generalized)  Other abnormalities of gait and mobility  Rationale for Evaluation and Treatment: Rehabilitation  ONSET DATE: chronic with recent falls   SUBJECTIVE:   SUBJECTIVE STATEMENT: Knees are about a 1/10 today. Lt worse than RT typically.     PERTINENT HISTORY: Anxiety, HTN, reverse shoulder replacement (2021) PAIN:  Are you having pain? Yes: NPRS scale:1/10 Pain location: Lt>Rt knee  Pain description: sore, intense  Aggravating factors: going up/down steps  Relieving factors: advil   PRECAUTIONS: Fall  WEIGHT BEARING RESTRICTIONS: No  FALLS:  Has patient fallen in last 6 months? Yes. Number of falls tripped going up steps and caught foot   LIVING ENVIRONMENT: Lives with: lives with their spouse Lives in: House/apartment  Stairs: Yes: External: 3 steps; on right going up Has following equipment at home: None  OCCUPATION: retired  PLOF: Independent, Vocation/Vocational requirements: desk work, walking-retired but small amount of work, and Leisure: walking for Psychologist, sport and exercise every Friday for strength training.   PATIENT GOALS: reduce pain, improve balance   NEXT MD VISIT: 6 weeks   OBJECTIVE:   DIAGNOSTIC FINDINGS: X-ray: Left knee Degenerative changes. No acute osseous abnormalities  Right knee:  Degenerative changes. No acute osseous abnormalities.    COGNITION: Overall cognitive status: Within functional limits for tasks assessed     SENSATION: WFL  SLS Rt x 10, Lt x2 seconds  MUSCLE  LENGTH: Hamstring flexibility limited by 25% bilaterally  POSTURE: rounded shoulders and forward head  PALPATION: Diffuse palpable tenderness in bil quads, and with patellar mobs/compression   LOWER EXTREMITY ROM:  Knee extension limited   LOWER EXTREMITY MMT:  MMT Right eval Left eval  Hip flexion 4 4  Hip extension 4+ 4+  Hip abduction 4 4  Hip adduction    Hip internal rotation    Hip external rotation    Knee flexion 4+ 4+  Knee extension 4+ 4+  Ankle dorsiflexion 5 5  Ankle plantarflexion    Ankle inversion    Ankle eversion     (Blank rows = not tested)   FUNCTIONAL TESTS:  5/14/245x sit to stand: 17.32 seconds Berg Balance Test: 52/56 04/01/23: 18 seconds with improved  04/08/23: 5x sit to stand: 16.2 then 13.3 seconds  Berg Balance test: 53/56  GAIT: Distance walked: 100 Assistive device utilized: None Level of assistance: Complete Independence Comments: bil knee flexion    TODAY'S TREATMENT:    04/21/23:Pt arrives for aquatic physical therapy. Treatment took place in 3.5-5.5 feet of water. Water temperature was 91 degrees F. Pt entered the pool via stairs with use of hand rails. Pt requires buoyancy of water for support and to offload joints with strengthening exercises.   75% water depth water walking 6 lengths with UE single bouy wts for push/pull. 3 way hip kicks 10x Bil with some use of pool wall for balance. VC mainly for control/exercise set up/ and how he would progress it over time. Pt verbally understood all concepts. Large noodle decompression for LE. Educated pt how to use this shape/position to manage painful days. Underwater bicycle with PTA assist for trunk control: 4 lengths.  DATE: 04/08/23 NuStep: Level 3 x 10 minutes-PT present to discuss progress Step practice with use of walking stick, gait around clinic with walking stick with instruction for correct use Standing gastroc stretch 3x20 seconds  Deadlift with 10# kettlebell: 2x10 Figure 4  3x20 seconds Rt and Lt seated  Sit to stand-seated on pad 2x10-less pain today Verbal review of all HEP with pt and he agreed to compliance.   DATE: 04/06/23 NuStep: Level 3 x 10 minutes-PT present to discuss progress Standing gastroc stretch 3x20 seconds  Deadlift with 10# kettlebell: 2x10 Figure 4 3x20 seconds Rt and Lt seated  Sit to stand-seated on pad 2x10-less pain today Standing hip extension and abduction with red band x10 each bil Airex balance beam: sidestepping and tandem line walk in // bars Hurdles: step over step forward with added cobble blocks Hamstring stretch standing with foot on power plate  PATIENT EDUCATION:  Education details: Access Code: [email or web address] Person educated: Patient Education method: Programmer, multimedia, Facilities manager, and Handouts Education comprehension: verbalized understanding and returned demonstration  HOME EXERCISE PROGRAM: Access Code: [email or web address] URL: [email or web address] Date: 04/01/2023 Prepared  by: Tresa Endo  Exercises - Sit to Stand Without Arm Support  - 2 x daily - 7 x weekly - 2 sets - 5 reps - Seated Hamstring Stretch  - 3 x daily - 7 x weekly - 1 sets - 3 reps - 20 hold - Gastroc Stretch on Wall  - 3 x daily - 7 x weekly - 1 sets - 3 reps - 20 hold - Standing Tandem Balance with Counter Support  - 2 x daily - 7 x weekly - 1 sets - 5 reps - 10-20 sec hold - Standing Hip Abduction  - 2 x daily - 7 x weekly - 2 sets - 10 reps - Standing Hip Extension  - 2 x daily - 7 x weekly - 2 sets - 10 reps - Half Deadlift with Kettlebell  - 2 x daily - 7 x weekly - 2 sets - 10 reps  ASSESSMENT:  CLINICAL IMPRESSION: Pt arrives for first aquatic PT session today with very minimal knee pain. His plan is to learn a program he will be able to do at the [facility]. Lt knee had a few twinges but no significant pt, in fact pt was quite surprised and pleased how good he felt moving in the water. Pt performed all the exercises with excellent form, control, and  understanding of how he would progress in the future.  OBJECTIVE IMPAIRMENTS: Abnormal gait, decreased activity tolerance, decreased balance, decreased endurance, difficulty walking, decreased strength, decreased safety awareness, increased muscle spasms, impaired flexibility, improper body mechanics, and pain.   ACTIVITY LIMITATIONS: carrying, squatting, stairs, transfers, and locomotion level  PARTICIPATION LIMITATIONS: community activity, occupation, and yard work  PERSONAL FACTORS: Age, Time since onset of injury/illness/exacerbation, and 1 comorbidity: OA  are also affecting patient's functional outcome.   REHAB POTENTIAL: Good  CLINICAL DECISION MAKING: Evolving/moderate complexity  EVALUATION COMPLEXITY: Moderate   GOALS: Goals reviewed with patient? Yes  SHORT TERM GOALS: Target date: 04/13/2023   Be independent in initial HEP Baseline: Goal status: MET  2.  Perform 5x sit to stand in < 14 seconds without UE  support to reduce falls risk Baseline: 18 with improved technique (04/01/23)  Goal status: IN PROGRESS  3.  Report > or = to 30% reduction in Lt knee pain with negotiating steps  Baseline: no change in pain (04/01/23) Goal status: NOT MET  LONG TERM GOALS: Target date: 05/14/23  Be independent in advanced HEP Baseline:  Goal status: INITIAL  2.  Perform 5x sit to stand in < or = to 12 seconds to reduce falls risk  Baseline: 13 (04/08/23) Goal status: NOT MET  3.  Report  or = to 60% reduction in Lt knee pain with negotiating steps  Baseline: 7/10 Goal status: INITIAL  4.  Improve bil hip and knee strength to 4+/5 to 5/5 to improve functional mobility Baseline:  Goal status: INITIAL  5.  Improve Berg Balance Test to > or = to 55/56 to reduce falls risk  Baseline: 53/56 (04/08/23) Goal status: NOT MET PLAN:  PT FREQUENCY: 2x/week  PT DURATION: 8 weeks  PLANNED INTERVENTIONS: Therapeutic exercises, Therapeutic activity, Neuromuscular re-education,  Balance training, Gait training, Patient/Family education, Self Care, Joint mobilization, Stair training, Vestibular training, Aquatic Therapy, Dry Needling, Cryotherapy, Moist heat, Manual therapy, and Re-evaluation  PLAN FOR NEXT SESSION: See how first aquatic session went.  Ane Payment, PTA 04/21/23 10:24 AM

## 2023-04-21 ENCOUNTER — Ambulatory Visit: Payer: Medicare Other | Admitting: Physical Therapy

## 2023-04-21 ENCOUNTER — Encounter: Payer: Self-pay | Admitting: Physical Therapy

## 2023-04-21 DIAGNOSIS — M6281 Muscle weakness (generalized): Secondary | ICD-10-CM

## 2023-04-21 DIAGNOSIS — G8929 Other chronic pain: Secondary | ICD-10-CM

## 2023-04-21 DIAGNOSIS — R2689 Other abnormalities of gait and mobility: Secondary | ICD-10-CM

## 2023-04-21 DIAGNOSIS — M25562 Pain in left knee: Secondary | ICD-10-CM | POA: Diagnosis not present

## 2023-04-28 ENCOUNTER — Other Ambulatory Visit (HOSPITAL_COMMUNITY): Payer: Self-pay

## 2023-04-30 ENCOUNTER — Other Ambulatory Visit (HOSPITAL_COMMUNITY): Payer: Self-pay

## 2023-05-05 ENCOUNTER — Ambulatory Visit: Payer: Self-pay | Admitting: Physical Therapy

## 2023-05-18 DIAGNOSIS — L821 Other seborrheic keratosis: Secondary | ICD-10-CM | POA: Diagnosis not present

## 2023-05-18 DIAGNOSIS — D485 Neoplasm of uncertain behavior of skin: Secondary | ICD-10-CM | POA: Diagnosis not present

## 2023-05-18 DIAGNOSIS — L570 Actinic keratosis: Secondary | ICD-10-CM | POA: Diagnosis not present

## 2023-05-18 DIAGNOSIS — D0439 Carcinoma in situ of skin of other parts of face: Secondary | ICD-10-CM | POA: Diagnosis not present

## 2023-05-18 DIAGNOSIS — L578 Other skin changes due to chronic exposure to nonionizing radiation: Secondary | ICD-10-CM | POA: Diagnosis not present

## 2023-05-18 DIAGNOSIS — D225 Melanocytic nevi of trunk: Secondary | ICD-10-CM | POA: Diagnosis not present

## 2023-05-18 DIAGNOSIS — Z85828 Personal history of other malignant neoplasm of skin: Secondary | ICD-10-CM | POA: Diagnosis not present

## 2023-05-18 NOTE — Therapy (Unsigned)
OUTPATIENT PHYSICAL THERAPY TREATMENT   Patient Name: Cory Barnett MRN: [id number] DOB:1945-12-28, 77 y.o., male Today's Date: 05/19/2023  END OF SESSION:  PT End of Session - 05/19/23 1228     Visit Number 9    Date for PT Re-Evaluation 06/16/23    Authorization Type Medicare (KX at 15)    Progress Note Due on Visit 10    PT Start Time 0845    PT Stop Time 0930    PT Time Calculation (min) 45 min    Activity Tolerance Patient tolerated treatment well                     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   IFG (impaired fasting glucose) 01/2016   Pre-diabetes    RBBB (right bundle branch block)    Rheumatic fever    age 83 or 23, in hospital for a month, out of school for a yr   Seasonal allergies    Squamous cell carcinoma    Testicular hypofunction    Past Surgical History:  Procedure Laterality Date   EYE SURGERY     right eye  cataract surgery with lens implant   NASAL SEPTUM SURGERY     NOSE SURGERY     REVERSE SHOULDER ARTHROPLASTY Right 01/11/2020   Procedure: REVERSE SHOULDER ARTHROPLASTY;  Surgeon: Jones Broom, MD;  Location: [facility];  Service: Orthopedics;  Laterality: Right;   SHOULDER ARTHROSCOPY WITH SUBACROMIAL DECOMPRESSION Right 08/07/2019   Procedure: SHOULDER ARTHROSCOPY WITH SUBACROMIAL DECOMPRESSION;  Surgeon: Jones Broom, MD;  Location: [facility];  Service: Orthopedics;  Laterality: Right;   SKIN CANCER EXCISION     basal and squamous cell    TONSILLECTOMY     VASECTOMY     WISDOM TOOTH EXTRACTION     Patient Active Problem List   Diagnosis Date Noted   Bursitis of both hips 02/23/2023   Degenerative arthritis of knee, bilateral 11/08/2022   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   Left knee pain  08/05/2020   Piriformis syndrome of left side 08/18/2019   Rotator cuff tear, right 05/29/2019   Closed fracture of distal clavicle 05/15/2019   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 It lasted a good 24 hours. I plan to add aquatic exercise 2x week to my routine after my last pool visit.   PERTINENT HISTORY: Anxiety, HTN, reverse shoulder replacement (2021) PAIN:  Are you having pain? Yes: NPRS scale:1/10 Pain location: Lt>Rt knee  Pain description: sore, intense  Aggravating factors: going up/down steps  Relieving factors: advil   PRECAUTIONS: Fall  WEIGHT BEARING RESTRICTIONS: No  FALLS:  Has  patient fallen in last 6 months? Yes. Number of falls tripped going up steps and caught foot   LIVING ENVIRONMENT: Lives with: lives with their spouse Lives in: House/apartment Stairs: Yes: External: 3 steps; on right going up Has following equipment at home: None  OCCUPATION: retired  PLOF: Independent, Vocation/Vocational requirements: desk work, walking-retired but small amount of work, and Leisure: walking for Psychologist, sport and exercise every Friday for strength training.   PATIENT GOALS: reduce pain, improve balance   NEXT MD VISIT: 6 weeks   OBJECTIVE:   DIAGNOSTIC FINDINGS: X-ray: Left knee Degenerative changes. No acute osseous abnormalities  Right knee:  Degenerative changes. No acute osseous abnormalities.     COGNITION: Overall cognitive status: Within functional limits for tasks assessed     SENSATION: WFL  SLS Rt x 10, Lt x2 seconds  MUSCLE LENGTH: Hamstring flexibility limited by 25% bilaterally  POSTURE: rounded shoulders and forward head  PALPATION: Diffuse palpable tenderness in bil quads, and with patellar mobs/compression   LOWER EXTREMITY ROM:  Knee extension limited   LOWER EXTREMITY MMT:  MMT Right eval Left eval  Hip flexion 4 4  Hip extension 4+ 4+  Hip abduction 4 4  Hip adduction    Hip internal rotation    Hip external rotation    Knee flexion 4+ 4+  Knee extension 4+ 4+  Ankle dorsiflexion 5 5  Ankle plantarflexion    Ankle inversion    Ankle eversion     (Blank rows = not tested)   FUNCTIONAL TESTS:  5/14/245x sit to stand: 17.32 seconds Berg Balance Test: 52/56 04/01/23: 18 seconds with improved  04/08/23: 5x sit to stand: 16.2 then 13.3 seconds  Berg Balance test: 53/56  GAIT: Distance walked: 100 Assistive device utilized: None Level of assistance: Complete Independence Comments: bil knee flexion    TODAY'S TREATMENT:    05/19/23:Pt arrives for aquatic physical therapy. Treatment took place in 3.5-5.5 feet of water. Water temperature was 91 degrees F. Pt entered the pool via stairs with use of hand rails. Pt requires buoyancy of water for support and to offload joints with strengthening exercises.   75% water depth water walking 10 lengths with UE single bouy wts for push/pull. 3 way hip kicks 15x Bil with some use of pool wall for balance. Large noodle decompression for LE 2 min Underwater bicycle with PTA assist for trunk control: 5 min  04/21/23:Pt arrives for aquatic physical therapy. Treatment took place in 3.5-5.5 feet of water. Water temperature was 91 degrees F. Pt entered the pool via stairs with use of hand rails. Pt requires buoyancy of water for support and to offload joints with strengthening exercises.   75% water depth water  walking 6 lengths with UE single bouy wts for push/pull. 3 way hip kicks 10x Bil with some use of pool wall for balance. VC mainly for control/exercise set up/ and how he would progress it over time. Pt verbally understood all concepts. Large noodle decompression for LE. Educated pt how to use this shape/position to manage painful days. Underwater bicycle with PTA assist for trunk control: 4 lengths.  DATE: 04/08/23 NuStep: Level 3 x 10 minutes-PT present to discuss progress Step practice with use of walking stick, gait around clinic with walking stick with instruction for correct use Standing gastroc stretch 3x20 seconds  Deadlift with 10# kettlebell: 2x10 Figure 4 3x20 seconds Rt and Lt seated  Sit to stand-seated on pad 2x10-less pain today Verbal review of all HEP with pt  and he agreed to compliance.   PATIENT EDUCATION:  Education details: Access Code: [email or web address] Person educated: Patient Education method: Explanation, Demonstration, and Handouts Education comprehension: verbalized understanding and returned demonstration  HOME EXERCISE PROGRAM: Access Code: [email or web address] URL: [email or web address] Date: 04/01/2023 Prepared by: Tresa Endo  Exercises - Sit to Stand Without Arm Support  - 2 x daily - 7 x weekly - 2 sets - 5 reps - Seated Hamstring Stretch  - 3 x daily - 7 x weekly - 1 sets - 3 reps - 20 hold - Gastroc Stretch on Wall  - 3 x daily - 7 x weekly - 1 sets - 3 reps - 20 hold - Standing Tandem Balance with Counter Support  - 2 x daily - 7 x weekly - 1 sets - 5 reps - 10-20 sec hold - Standing Hip Abduction  - 2 x daily - 7 x weekly - 2 sets - 10 reps - Standing Hip Extension  - 2 x daily - 7 x weekly - 2 sets - 10 reps - Half Deadlift with Kettlebell  - 2 x daily - 7 x weekly - 2 sets - 10 reps  ASSESSMENT:  CLINICAL IMPRESSION: Pt had good first response to aquatic PT. He had 24 hours of "feeling very good." Pt has decided to continue with water exercise routine 2x  week when he completes the formal PT next week.  05/19/23 by Lorrene Reid, PT 05/19/23 9:25 PM  Pt is making progress regarding PT and has responded well to first aquatic session.  He continues to exercise on land and experiences less pain when in the water.  He had to cancel appt on 05/19/23 and has 2 remaining sessions scheduled for further education regarding exercise progression in the pool.  He will transition to an independent program at [facility].  He benefits from aquatic properties to allow for greater tolerance for exercise.  Patient will benefit from skilled PT to address the below impairments and improve overall function.   OBJECTIVE IMPAIRMENTS: Abnormal gait, decreased activity tolerance, decreased balance, decreased endurance, difficulty walking, decreased strength, decreased safety awareness, increased muscle spasms, impaired flexibility, improper body mechanics, and pain.   ACTIVITY LIMITATIONS: carrying, squatting, stairs, transfers, and locomotion level  PARTICIPATION LIMITATIONS: community activity, occupation, and yard work  PERSONAL FACTORS: Age, Time since onset of injury/illness/exacerbation, and 1 comorbidity: OA  are also affecting patient's functional outcome.   REHAB POTENTIAL: Good  CLINICAL DECISION MAKING: Evolving/moderate complexity  EVALUATION COMPLEXITY: Moderate   GOALS: Goals reviewed with patient? Yes  SHORT TERM GOALS: Target date: 04/13/2023   Be independent in initial HEP Baseline: Goal status: MET  2.  Perform 5x sit to stand in < 14 seconds without UE  support to reduce falls risk Baseline: 18 with improved technique (04/01/23)  Goal status: IN PROGRESS  3.  Report > or = to 30% reduction in Lt knee pain with negotiating steps  Baseline: no change in pain (04/01/23) Goal status: NOT MET  Cory TERM GOALS: Target date: 06/16/23  Be independent in advanced HEP Baseline:  Goal status: INITIAL  2.  Perform 5x sit to stand in < or = to 12  seconds to reduce falls risk  Baseline: 13 (04/08/23) Goal status: NOT MET  3.  Report  or = to 60% reduction in Lt knee pain with negotiating steps  Baseline: 7/10 Goal status: INITIAL  4.  Improve bil hip and knee strength to 4+/5 to 5/5 to improve functional mobility Baseline:  Goal  status: INITIAL  5.  Improve Berg Balance Test to > or = to 55/56 to reduce falls risk  Baseline: 53/56 (04/08/23) Goal status: NOT MET PLAN:  PT FREQUENCY: 2x/week  PT DURATION: 4 weeks  PLANNED INTERVENTIONS: Therapeutic exercises, Therapeutic activity, Neuromuscular re-education, Balance training, Gait training, Patient/Family education, Self Care, Joint mobilization, Stair training, Vestibular training, Aquatic Therapy, Dry Needling, Cryotherapy, Moist heat, Manual therapy, and Re-evaluation  PLAN FOR NEXT SESSION: DC to independent aquatic exercise next.  Lorrene Reid, PT 05/19/23 9:25 PM   Ane Payment, PTA 05/19/23 9:25 PM

## 2023-05-19 ENCOUNTER — Encounter: Payer: Self-pay | Admitting: Physical Therapy

## 2023-05-19 ENCOUNTER — Ambulatory Visit: Payer: Medicare Other | Attending: Family Medicine | Admitting: Physical Therapy

## 2023-05-19 DIAGNOSIS — R2689 Other abnormalities of gait and mobility: Secondary | ICD-10-CM | POA: Diagnosis not present

## 2023-05-19 DIAGNOSIS — M6281 Muscle weakness (generalized): Secondary | ICD-10-CM | POA: Insufficient documentation

## 2023-05-19 DIAGNOSIS — M25562 Pain in left knee: Secondary | ICD-10-CM | POA: Insufficient documentation

## 2023-05-19 DIAGNOSIS — G8929 Other chronic pain: Secondary | ICD-10-CM | POA: Diagnosis not present

## 2023-05-20 NOTE — Progress Notes (Unsigned)
[name] [address] [place] [address] Phone: [phone] Subjective:   Cory Barnett, am serving as a scribe for Dr. Antoine Primas.   I'm seeing this patient by the request  of:  Rodrigo Ran, MD  CC: back, knee and hip pain   GMW:[id number]  02/23/2023 Bilateral knees given.  I am concerned that there could be some underlying lumbar radiculitis that is contributing to the weakness of the hips that is causing the bursitis.  Given home exercises, we will get x-rays of the back again to further evaluate.  Worsening symptoms or weakness of the lower extremities do need to consider the possibility of advanced imaging.  Patient is already done formal physical therapy for this problem long-term.  In 6 to 8 weeks     Injections given bilaterally.  Discussed icing regimen and home exercises.  Continue to stay active.  Hopeful that patient will have time before any surgical intervention is necessary.  We did discuss that this can take up to a month before we notice significant improvement.  Follow-up with me again in 6 to 8 weeks.      Update 05/25/2023 Cory Barnett is a 77 y.o. male coming in with complaint of B knee and B hip pain. Patient states that his hip pain is better but his knee pain is only fair. Having difficult time time going up and down steps. No pain with walking.  Hx of R shoulder reversal 2 years ago by Dr. Ave Filter. Sleeps on side and notices pain seems to be increasing recently. Pain in middle deltoid. Does ladder climbs when he wakes up in morning due to stiffness. Cannot recall any injury recently but pain seems to be occurring for past 45 days.     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   IFG (impaired fasting  glucose) 01/2016   Pre-diabetes    RBBB (right bundle branch block)    Rheumatic fever    age 20 or 37, in hospital for a month, out of school for a yr   Seasonal allergies    Squamous cell carcinoma    Testicular hypofunction    Past Surgical History:  Procedure Laterality Date   EYE SURGERY     right eye  cataract surgery with lens implant   NASAL SEPTUM SURGERY     NOSE SURGERY     REVERSE SHOULDER ARTHROPLASTY Right 01/11/2020   Procedure: REVERSE SHOULDER ARTHROPLASTY;  Surgeon: Jones Broom, MD;  Location: [facility];  Service: Orthopedics;  Laterality: Right;   SHOULDER ARTHROSCOPY WITH SUBACROMIAL DECOMPRESSION Right 08/07/2019   Procedure: SHOULDER ARTHROSCOPY WITH SUBACROMIAL DECOMPRESSION;  Surgeon: Jones Broom, MD;  Location: [facility];  Service: Orthopedics;  Laterality: Right;   SKIN CANCER EXCISION     basal and squamous cell    TONSILLECTOMY     VASECTOMY     WISDOM TOOTH EXTRACTION     Social History   Socioeconomic History   Marital status: Married    Spouse name: Not on file   Number of children: 3   Years of education: Not on file   Highest education level: Not on file  Occupational  History    Comment: Retired  Tobacco Use   Smoking status: Former    Current packs/day: 0.00    Types: Cigarettes    Quit date: 12/14/1969    Years since quitting: 53.4   Smokeless tobacco: Never  Vaping Use   Vaping status: Never Used  Substance and Sexual Activity   Alcohol use: Yes    Alcohol/week: 2.0 standard drinks of alcohol    Types: 2 Cans of beer per week    Comment: social   Drug use: Never   Sexual activity: Not on file  Other Topics Concern   Not on file  Social History Narrative   Not on file   Social Determinants of Health   Financial Resource Strain: Not on file  Food Insecurity: Not on file  Transportation Needs: Not on file  Physical Activity: Not on file  Stress: Not on file  Social Connections: Unknown (03/17/2022)    Received [facility]   Social Network    Social Network: Not on file   Allergies  Allergen Reactions   Bee Venom Anaphylaxis   Moxifloxacin Shortness Of Breath   Family History  Problem Relation Age of Onset   Stroke Mother    Arthritis Mother    Cancer Mother        unknown    Dementia Mother    Arthritis Father    Mesothelioma Father    Arthritis Sister    Arthritis Sister    Bipolar disorder Sister      Current Outpatient Medications (Cardiovascular):    ezetimibe (ZETIA) 10 MG tablet, Take 1 tablet (10 mg total) by mouth daily.   losartan (COZAAR) 50 MG tablet, Take 1 tablet (50 mg total) by mouth at bedtime.   rosuvastatin (CRESTOR) 10 MG tablet, TAKE 1 TABLET BY MOUTH EVERY DAY   rosuvastatin (CRESTOR) 20 MG tablet, Take 1 tablet (20 mg total) by mouth daily.   sildenafil (REVATIO) 20 MG tablet, Take 1-5 tablets (20-100 mg total) by mouth 30 minutes-4 hours prior to sexual activity as needed.   tadalafil (CIALIS) 5 MG tablet, Take 1 tablet (5 mg total) by mouth daily.     Current Outpatient Medications (Other):    amoxicillin (AMOXIL) 500 MG capsule, Take 4 capsules by mouth 1 hour prior to dental treatment   Barberry-Oreg Grape-Goldenseal (BERBERINE COMPLEX PO), Take by mouth.   CHELATED MAGNESIUM PO, Take 2 tablets by mouth in the morning and at bedtime.    Cholecalciferol (VITAMIN D3) 1.25 MG (50000 UT) CAPS, Take by mouth.   Coenzyme Q10 (CO Q 10) 100 MG CAPS, Take 100 mg by mouth in the morning and at bedtime.    escitalopram (LEXAPRO) 10 MG tablet, Take 1 tablet (10 mg total) by mouth daily.   Investigational omega-3-fatty acid/placebo capsule S0927*, Take 4 capsules by mouth 2 (two) times daily. Take with food.   Multiple Vitamin (MULTI-VITAMINS) TABS, Take 3 tablets by mouth in the morning and at bedtime.    nirmatrelvir/ritonavir EUA (PAXLOVID) TABS, Take 3 tablets by mouth 2 (two) times daily.   prednisoLONE acetate (PRED FORTE) 1 % ophthalmic  suspension, Place 1 drop into the right eye 3 (three) times daily.   prednisoLONE acetate (PRED FORTE) 1 % ophthalmic suspension, Place 1 drop into the right eye 3 times daily.   Probiotic Product (PROBIOTIC PO), Take 1 capsule by mouth daily.   Saw Palmetto, Serenoa repens, (SAW PALMETTO PO), Take 2 capsules by mouth in the morning and at  bedtime.   amoxicillin (AMOXIL) 500 MG capsule, Take 4 capsules by mouth 1 hour prior to dental treatment   erythromycin ophthalmic ointment, Apply ointment to upper eyelids in the morning and at bedtime for 2 more weeks   omeprazole (PRILOSEC) 40 MG capsule, Take 1 capsule by mouth daily * These medications belong to multiple therapeutic classes and are listed under each applicable group.   Reviewed prior external information including notes and imaging from  primary care provider As well as notes that were available from care everywhere and other healthcare systems.  Past medical history, social, surgical and family history all reviewed in electronic medical record.  No pertanent information unless stated regarding to the chief complaint.   Review of Systems:  No headache, visual changes, nausea, vomiting, diarrhea, constipation, dizziness, abdominal pain, skin rash, fevers, chills, night sweats, weight loss, swollen lymph nodes, body aches, joint swelling, chest pain, shortness of breath, mood changes. POSITIVE muscle aches  Objective  Blood pressure 118/82, pulse 82, height 6\' 1"  (1.854 m), weight 217 lb (98.4 kg), SpO2 98%.   General: No apparent distress alert and oriented x3 mood and affect normal, dressed appropriately.  HEENT: Pupils equal, extraocular movements intact  Respiratory: Patient's speak in full sentences and does not appear short of breath  Cardiovascular: No lower extremity edema, non tender, no erythema  Patient does have an antalgic gait noted.  Crepitus of the knees bilaterally.  Does have some pain over the distal quadricep  tendon bilaterally.  Lateral tracking of the patella noted.  After informed written and verbal consent, patient was seated on exam table. Right knee was prepped with alcohol swab and utilizing anterolateral approach, patient's right knee space was injected with 4:1  marcaine 0.5%: Kenalog 40mg /dL. Patient tolerated the procedure well without immediate complications.  After informed written and verbal consent, patient was seated on exam table. Left knee was prepped with alcohol swab and utilizing anterolateral approach, patient's left knee space was injected with 4:1  marcaine 0.5%: Kenalog 40mg /dL. Patient tolerated the procedure well without immediate complications.    Impression and Recommendations:    The above documentation has been reviewed and is accurate and complete Judi Saa, DO

## 2023-05-24 ENCOUNTER — Other Ambulatory Visit (HOSPITAL_COMMUNITY): Payer: Self-pay

## 2023-05-24 ENCOUNTER — Other Ambulatory Visit: Payer: Self-pay

## 2023-05-24 DIAGNOSIS — H18512 Endothelial corneal dystrophy, left eye: Secondary | ICD-10-CM | POA: Diagnosis not present

## 2023-05-24 DIAGNOSIS — Z947 Corneal transplant status: Secondary | ICD-10-CM | POA: Diagnosis not present

## 2023-05-24 DIAGNOSIS — H2512 Age-related nuclear cataract, left eye: Secondary | ICD-10-CM | POA: Diagnosis not present

## 2023-05-24 DIAGNOSIS — Z961 Presence of intraocular lens: Secondary | ICD-10-CM | POA: Diagnosis not present

## 2023-05-24 MED ORDER — PREDNISOLONE ACETATE 1 % OP SUSP
1.0000 [drp] | Freq: Three times a day (TID) | OPHTHALMIC | 11 refills | Status: AC
  Filled 2023-05-24: qty 10, 20d supply, fill #0
  Filled 2024-03-06: qty 10, 20d supply, fill #1
  Filled 2024-03-29: qty 10, 20d supply, fill #2

## 2023-05-25 ENCOUNTER — Ambulatory Visit (INDEPENDENT_AMBULATORY_CARE_PROVIDER_SITE_OTHER): Payer: Medicare Other | Admitting: Family Medicine

## 2023-05-25 ENCOUNTER — Other Ambulatory Visit: Payer: Self-pay

## 2023-05-25 ENCOUNTER — Encounter: Payer: Self-pay | Admitting: Family Medicine

## 2023-05-25 VITALS — BP 118/82 | HR 82 | Ht 73.0 in | Wt 217.0 lb

## 2023-05-25 DIAGNOSIS — M25551 Pain in right hip: Secondary | ICD-10-CM

## 2023-05-25 DIAGNOSIS — M170 Bilateral primary osteoarthritis of knee: Secondary | ICD-10-CM | POA: Diagnosis not present

## 2023-05-25 DIAGNOSIS — M75101 Unspecified rotator cuff tear or rupture of right shoulder, not specified as traumatic: Secondary | ICD-10-CM

## 2023-05-25 DIAGNOSIS — M25552 Pain in left hip: Secondary | ICD-10-CM | POA: Diagnosis not present

## 2023-05-25 NOTE — Patient Instructions (Addendum)
Injected both knees Read about PRP Dr. Magnus Ivan will call you See me again in 6 weeks if you want to do PRP See me in 12 weeks if we do steroid

## 2023-05-25 NOTE — Assessment & Plan Note (Signed)
Status post replacement.  He is going to follow-up with his orthopedic surgeon on this

## 2023-05-25 NOTE — Assessment & Plan Note (Signed)
Chronic problem with exacerbation and worsening symptoms.  Would like to discuss with a orthopedic surgeon to know what surgical intervention would look like.  We discussed with patient about the potential for PRP.  Can repeat injections every 3 months if needed.  Follow-up again in 2 to 3 months otherwise

## 2023-05-26 ENCOUNTER — Ambulatory Visit: Payer: Medicare Other | Admitting: Physical Therapy

## 2023-05-26 ENCOUNTER — Encounter: Payer: Self-pay | Admitting: Physical Therapy

## 2023-05-26 DIAGNOSIS — R2689 Other abnormalities of gait and mobility: Secondary | ICD-10-CM | POA: Diagnosis not present

## 2023-05-26 DIAGNOSIS — M6281 Muscle weakness (generalized): Secondary | ICD-10-CM

## 2023-05-26 DIAGNOSIS — M25562 Pain in left knee: Secondary | ICD-10-CM | POA: Diagnosis not present

## 2023-05-26 DIAGNOSIS — G8929 Other chronic pain: Secondary | ICD-10-CM

## 2023-05-26 NOTE — Therapy (Addendum)
OUTPATIENT PHYSICAL THERAPY TREATMENT   Patient Name: Cory Barnett MRN: [id number] DOB:Sep 17, 1946, 77 y.o., male Today's Date: 05/26/2023  END OF SESSION:  PT End of Session - 05/26/23 0846     Visit Number 10    Date for PT Re-Evaluation 06/16/23    Authorization Type Medicare (KX at 15)    Progress Note Due on Visit 10    PT Start Time 0845    PT Stop Time 0935    PT Time Calculation (min) 50 min    Activity Tolerance Patient tolerated treatment well    Behavior During Therapy [facility] for tasks assessed/performed                      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   IFG (impaired fasting glucose) 01/2016   Pre-diabetes    RBBB (right bundle branch block)    Rheumatic fever    age 58 or 28, in hospital for a month, out of school for a yr   Seasonal allergies    Squamous cell carcinoma    Testicular hypofunction    Past Surgical History:  Procedure Laterality Date   EYE SURGERY     right eye  cataract surgery with lens implant   NASAL SEPTUM SURGERY     NOSE SURGERY     REVERSE SHOULDER ARTHROPLASTY Right 01/11/2020   Procedure: REVERSE SHOULDER ARTHROPLASTY;  Surgeon: Jones Broom, MD;  Location: [facility];  Service: Orthopedics;  Laterality: Right;   SHOULDER ARTHROSCOPY WITH SUBACROMIAL DECOMPRESSION Right 08/07/2019   Procedure: SHOULDER ARTHROSCOPY WITH SUBACROMIAL DECOMPRESSION;  Surgeon: Jones Broom, MD;  Location: [facility];  Service: Orthopedics;  Laterality: Right;   SKIN CANCER EXCISION     basal and squamous cell    TONSILLECTOMY     VASECTOMY     WISDOM TOOTH EXTRACTION     Patient Active Problem List   Diagnosis Date Noted   Bursitis of both hips 02/23/2023   Degenerative arthritis of knee, bilateral  11/08/2022   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   Left knee pain 08/05/2020   Piriformis syndrome of left side 08/18/2019   Rotator cuff tear, right 05/29/2019   Closed fracture of distal clavicle 05/15/2019   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  PERTINENT HISTORY: Anxiety, HTN, reverse shoulder replacement (2021) PAIN:  Are you having pain? Yes: NPRS scale:1/10 Pain location: Lt>Rt knee  Pain description: sore, intense  Aggravating factors: going up/down steps  Relieving factors: advil   PRECAUTIONS: Fall  WEIGHT BEARING RESTRICTIONS: No  FALLS:  Has patient fallen in last 6 months? Yes. Number of falls tripped going up steps and caught foot   LIVING ENVIRONMENT: Lives with: lives with their spouse  Lives in: House/apartment Stairs: Yes: External: 3 steps; on right going up Has following equipment at home: None  OCCUPATION: retired  PLOF: Independent, Vocation/Vocational requirements: desk work, walking-retired but small amount of work, and Leisure: walking for Psychologist, sport and exercise every Friday for strength training.   PATIENT GOALS: reduce pain, improve balance   NEXT MD VISIT: 6 weeks   OBJECTIVE:   DIAGNOSTIC FINDINGS: X-ray: Left knee Degenerative changes. No acute osseous abnormalities  Right knee:  Degenerative changes. No acute osseous abnormalities.    COGNITION: Overall cognitive status: Within functional limits for tasks assessed     SENSATION: WFL  SLS Rt x 10, Lt  x2 seconds  MUSCLE LENGTH: Hamstring flexibility limited by 25% bilaterally  POSTURE: rounded shoulders and forward head  PALPATION: Diffuse palpable tenderness in bil quads, and with patellar mobs/compression   LOWER EXTREMITY ROM:  Knee extension limited   LOWER EXTREMITY MMT:  MMT Right eval Left eval 05/26/23 Rt &LT  Hip flexion 4 4 4+/5 *in water  Hip extension 4+ 4+ 4+ * in water  Hip abduction 4 4 4+ *in water  Hip adduction     Hip internal rotation     Hip external rotation     Knee flexion 4+ 4+ 4+ * in water  Knee extension 4+ 4+ 4+ * in water  Ankle dorsiflexion 5 5 5   Ankle plantarflexion     Ankle inversion     Ankle eversion      (Blank rows = not tested)   FUNCTIONAL TESTS:  5/14/245x sit to stand: 17.32 seconds Berg Balance Test: 52/56 04/01/23: 18 seconds with improved  04/08/23: 5x sit to stand: 16.2 then 13.3 seconds  Berg Balance test: 53/56  GAIT: Distance walked: 100 Assistive device utilized: None Level of assistance: Complete Independence Comments: bil knee flexion    TODAY'S TREATMENT:    05/26/23:Pt arrives for aquatic physical therapy. Treatment took place in 3.5-5.5 feet of water. Water temperature was 90 degrees F. Pt entered the pool via stairs with use of hand rails. Pt requires buoyancy of water for support and to offload joints with strengthening exercises.  Seated water bench with 75% submersion Pt performed seated LE AROM exercises 20x in all planes, concurrent discussion about sending his aquatic HEP via email and how to modify exercises for his Rt shoulder. Pt verbally understood all.  75% water depth water walking 10 lengths with UE single bouy wts for push/pull. 3 way hip kicks 20x Bil with some use of pool wall for balance, verbally discussed ways to increase challenge by increasing speed or adding aquatic ankle weights he would have to purchase individually. Deep water suspended scissors holding hand floats 20x. Discussed this  would be an excellent exercise in the deeper water at the [facility]. Large noodle decompression for LE 2 min Underwater bicycle with PTA assist for trunk control: 5 min  05/19/23:Pt arrives for aquatic physical therapy. Treatment took place in 3.5-5.5 feet of water. Water temperature was 91 degrees F. Pt entered the pool via stairs with use of hand rails. Pt requires buoyancy of water for support and to offload joints with strengthening exercises.   75% water depth water walking 10 lengths with UE single bouy wts for push/pull. 3 way hip kicks 15x Bil with some use of pool wall for balance. Large noodle decompression for LE 2 min Underwater bicycle with PTA assist for trunk control: 5 min  04/21/23:Pt arrives for aquatic physical therapy. Treatment took place in 3.5-5.5 feet  of water. Water temperature was 91 degrees F. Pt entered the pool via stairs with use of hand rails. Pt requires buoyancy of water for support and to offload joints with strengthening exercises.   75% water depth water walking 6 lengths with UE single bouy wts for push/pull. 3 way hip kicks 10x Bil with some use of pool wall for balance. VC mainly for control/exercise set up/ and how he would progress it over time. Pt verbally understood all concepts. Large noodle decompression for LE. Educated pt how to use this shape/position to manage painful days. Underwater bicycle with PTA assist for trunk control: 4 lengths.  PATIENT EDUCATION:  Education details: Access Code: [email or web address] Person educated: Patient Education method: Explanation, Facilities manager, and Handouts Education comprehension: verbalized understanding and returned demonstration  HOME EXERCISE PROGRAM:   Aquatic Access Code: [email or web address]  Aquatic HEP sent via email.  Ane Payment, PTA 05/26/23 10:15 AM   Access Code: [email or web address] URL: [email or web address] Date: 04/01/2023 Prepared by: Tresa Endo  Exercises - Sit to Stand Without Arm Support  - 2 x daily - 7 x  weekly - 2 sets - 5 reps - Seated Hamstring Stretch  - 3 x daily - 7 x weekly - 1 sets - 3 reps - 20 hold - Gastroc Stretch on Wall  - 3 x daily - 7 x weekly - 1 sets - 3 reps - 20 hold - Standing Tandem Balance with Counter Support  - 2 x daily - 7 x weekly - 1 sets - 5 reps - 10-20 sec hold - Standing Hip Abduction  - 2 x daily - 7 x weekly - 2 sets - 10 reps - Standing Hip Extension  - 2 x daily - 7 x weekly - 2 sets - 10 reps - Half Deadlift with Kettlebell  - 2 x daily - 7 x weekly - 2 sets - 10 reps  ASSESSMENT:  CLINICAL IMPRESSION: Pt is independent in beginner aquatic program that he intends to continue 2x week at te [employer]. Aquatic environment has helped 60% in reducing pain during and post exercise. Pt was educated how to modify exercises secondary to Rt shoulder limitations.    OBJECTIVE IMPAIRMENTS: Abnormal gait, decreased activity tolerance, decreased balance, decreased endurance, difficulty walking, decreased strength, decreased safety awareness, increased muscle spasms, impaired flexibility, improper body mechanics, and pain.   ACTIVITY LIMITATIONS: carrying, squatting, stairs, transfers, and locomotion level  PARTICIPATION LIMITATIONS: community activity, occupation, and yard work  PERSONAL FACTORS: Age, Time since onset of injury/illness/exacerbation, and 1 comorbidity: OA  are also affecting patient's functional outcome.   REHAB POTENTIAL: Good  CLINICAL DECISION MAKING: Evolving/moderate complexity  EVALUATION COMPLEXITY: Moderate   GOALS: Goals reviewed with patient? Yes  SHORT TERM GOALS: Target date: 04/13/2023   Be independent in initial HEP Baseline: Goal status: MET  2.  Perform 5x sit to stand in < 14 seconds without UE  support to reduce falls risk Baseline: 18 with improved technique (04/01/23)  Goal status: Not tested in water: unknown value.  3.  Report > or = to 30% reduction in Lt knee pain with negotiating steps  Baseline: no change in  pain (04/01/23) Goal status: Some better like after injections or water exercise but just not consistent to meet goal.   LONG TERM GOALS: Target date: 06/16/23  Be independent in advanced HEP Baseline:  Goal status: Goal met for land and water 05/26/23  2.  Perform 5x sit to stand in < or = to 12 seconds to reduce  falls risk  Baseline: 13 (04/08/23) Goal status: Unknown value, did not test in the water.   3.  Report  or = to 60% reduction in Lt knee pain with negotiating steps  Baseline: 7/10 Goal status: Not consistent, better post injections or water exercise but does not last. Not met  4.  Improve bil hip and knee strength to 4+/5 to 5/5 to improve functional mobility Baseline:  Goal status: Goal met 05/26/23  5.  Improve Berg Balance Test to > or = to 55/56 to reduce falls risk  Baseline: 53/56 (04/08/23) Goal status: NOT MET PLAN:  PT FREQUENCY: 2x/week  PT DURATION: 4 weeks  PLANNED INTERVENTIONS: Therapeutic exercises, Therapeutic activity, Neuromuscular re-education, Balance training, Gait training, Patient/Family education, Self Care, Joint mobilization, Stair training, Vestibular training, Aquatic Therapy, Dry Needling, Cryotherapy, Moist heat, Manual therapy, and Re-evaluation  PLAN FOR NEXT SESSION: DC to independent aquatic program.   Ane Payment, PTA 05/26/23 10:15 AM   PHYSICAL THERAPY DISCHARGE SUMMARY  Visits from Start of Care: 10  Current functional level related to goals / functional outcomes: See above for current status.  Pt is ready to transition to independent HEP and aquatics exercise.     Remaining deficits: Knee pain due to OA.  Pt is managing with exercise.    Education / Equipment: HEP   Patient agrees to discharge. Patient goals were partially met. Patient is being discharged due to being pleased with the current functional level.  Lorrene Reid, PT 05/26/23 10:42 AM

## 2023-06-10 ENCOUNTER — Other Ambulatory Visit (HOSPITAL_COMMUNITY): Payer: Self-pay

## 2023-06-11 ENCOUNTER — Other Ambulatory Visit (HOSPITAL_COMMUNITY): Payer: Self-pay

## 2023-06-11 MED ORDER — INCRUSE ELLIPTA 62.5 MCG/ACT IN AEPB
1.0000 | INHALATION_SPRAY | Freq: Every day | RESPIRATORY_TRACT | 3 refills | Status: AC
  Filled 2023-06-11 (×2): qty 30, 30d supply, fill #0

## 2023-06-14 ENCOUNTER — Other Ambulatory Visit (HOSPITAL_COMMUNITY): Payer: Self-pay

## 2023-06-14 DIAGNOSIS — I10 Essential (primary) hypertension: Secondary | ICD-10-CM | POA: Diagnosis not present

## 2023-06-14 DIAGNOSIS — R011 Cardiac murmur, unspecified: Secondary | ICD-10-CM | POA: Diagnosis not present

## 2023-06-14 DIAGNOSIS — J069 Acute upper respiratory infection, unspecified: Secondary | ICD-10-CM | POA: Diagnosis not present

## 2023-06-14 DIAGNOSIS — R051 Acute cough: Secondary | ICD-10-CM | POA: Diagnosis not present

## 2023-06-14 DIAGNOSIS — J309 Allergic rhinitis, unspecified: Secondary | ICD-10-CM | POA: Diagnosis not present

## 2023-06-14 MED ORDER — CEFDINIR 300 MG PO CAPS
300.0000 mg | ORAL_CAPSULE | Freq: Two times a day (BID) | ORAL | 0 refills | Status: AC
  Filled 2023-06-14: qty 20, 10d supply, fill #0

## 2023-06-15 ENCOUNTER — Ambulatory Visit (INDEPENDENT_AMBULATORY_CARE_PROVIDER_SITE_OTHER): Payer: Medicare Other | Admitting: Orthopaedic Surgery

## 2023-06-15 DIAGNOSIS — M1711 Unilateral primary osteoarthritis, right knee: Secondary | ICD-10-CM

## 2023-06-15 DIAGNOSIS — M1712 Unilateral primary osteoarthritis, left knee: Secondary | ICD-10-CM

## 2023-06-15 NOTE — Progress Notes (Signed)
Cory Barnett is very pleasant and active 77 year old gentleman who comes in to be seen for the first time for an opinion as a relates to his bilateral knee patellofemoral arthritis.  He is actually father of one of my patients that I recently performed hip replacement surgery.  He is followed regularly by Dr. Antoine Primas from a primary care sports medicine standpoint.  He has been very pleased with Dr. Katrinka Blazing.  About 4 weeks ago he had steroid injections in his knees and he is considering PRP.  He says the steroid injections have helped and he is encouraged that PRP may help as well.  He has had hyaluronic acid injections and that has not helped much.  He says his biggest complaint is really going up and down stairs.  There are x-rays on the canopy system for me to review.  He is a thin and active individual and wants to be able to stay active.  He said he does have some chronic pain with his knees.  He is never had surgery on his knees.  Examination of both knees show just slight varus malalignment.  There is very slight.  There is patellofemoral crepitation on flexion extension of both knees.  There is no effusion with either knee.  Both knees are ligamentously stable and have excellent range of motion.  X-rays were reviewed with him of both knees from 7 months ago.  He does have significant patellofemoral arthritis.  The medial lateral compartments are well-maintained.  We had a long and thorough discussion about his knees.  Most of his issue is patellofemoral arthritis.  I do believe he is a good candidate for trying PRP and continuing quad strengthening exercises which will help him the most.  He was happy to get an orthopedic opinion as a relates to his knees in terms of the potential future for knee replacement surgery.  I do believe he is a long way away from this and he should continue the care that he is receiving and he agrees with this as well.  If things worsen at all he knows to reach out and let me  know.  All questions and concerns were addressed and answered.

## 2023-06-21 DIAGNOSIS — C44329 Squamous cell carcinoma of skin of other parts of face: Secondary | ICD-10-CM | POA: Diagnosis not present

## 2023-06-26 NOTE — Progress Notes (Signed)
HPI: FU palpitations and CP.  Abd ultrasound 8/17 showed no AAA. Previous description of palpitations felt c/w PACs or PVCs.  Cardiac CTA January 2021 showed mild disease in the proximal LAD at 25 to 49%; calcium score 148.  Echocardiogram January 2021 showed normal LV function, moderate left atrial enlargement, mild tricuspid regurgitation, very mild aortic stenosis with mean gradient 9 mmHg and trace aortic insufficiency.  Since last seen, he denies dyspnea, chest pain, palpitations or syncope.  Current Outpatient Medications  Medication Sig Dispense Refill   amoxicillin (AMOXIL) 500 MG capsule Take 4 capsules by mouth 1 hour prior to dental treatment 8 capsule 2   Barberry-Oreg Grape-Goldenseal (BERBERINE COMPLEX PO) Take by mouth.     cefdinir (OMNICEF) 300 MG capsule Take 1 capsule (300 mg total) by mouth 2 (two) times daily with food. 20 capsule 0   CHELATED MAGNESIUM PO Take 2 tablets by mouth in the morning and at bedtime.      Cholecalciferol (VITAMIN D3) 1.25 MG (50000 UT) CAPS Take by mouth.     Coenzyme Q10 (CO Q 10) 100 MG CAPS Take 100 mg by mouth in the morning and at bedtime.      escitalopram (LEXAPRO) 10 MG tablet Take 1 tablet (10 mg total) by mouth daily. 90 tablet 3   ezetimibe (ZETIA) 10 MG tablet Take 1 tablet (10 mg total) by mouth daily. 90 tablet 3   Investigational omega-3-fatty acid/placebo capsule S0927 Take 4 capsules by mouth 2 (two) times daily. Take with food.     losartan (COZAAR) 50 MG tablet Take 1 tablet (50 mg total) by mouth at bedtime. 90 tablet 3   Multiple Vitamin (MULTI-VITAMINS) TABS Take 3 tablets by mouth in the morning and at bedtime.      nirmatrelvir/ritonavir EUA (PAXLOVID) TABS Take 3 tablets by mouth 2 (two) times daily. 30 tablet 0   prednisoLONE acetate (PRED FORTE) 1 % ophthalmic suspension Place 1 drop into the right eye 3 (three) times daily. 5 mL 3   prednisoLONE acetate (PRED FORTE) 1 % ophthalmic suspension Place 1 drop into the  right eye 3 times daily. 10 mL 11   Probiotic Product (PROBIOTIC PO) Take 1 capsule by mouth daily.     rosuvastatin (CRESTOR) 20 MG tablet Take 1 tablet (20 mg total) by mouth daily. 90 tablet 3   Saw Palmetto, Serenoa repens, (SAW PALMETTO PO) Take 2 capsules by mouth in the morning and at bedtime.     sildenafil (REVATIO) 20 MG tablet Take 1-5 tablets (20-100 mg total) by mouth 30 minutes-4 hours prior to sexual activity as needed. 50 tablet 11   tadalafil (CIALIS) 5 MG tablet Take 1 tablet (5 mg total) by mouth daily. 90 tablet 3   amoxicillin (AMOXIL) 500 MG capsule Take 4 capsules by mouth 1 hour prior to dental treatment 8 capsule 0   erythromycin ophthalmic ointment Apply ointment to upper eyelids in the morning and at bedtime for 2 more weeks 3.5 g 2   omeprazole (PRILOSEC) 40 MG capsule Take 1 capsule by mouth daily 90 capsule 1   rosuvastatin (CRESTOR) 10 MG tablet TAKE 1 TABLET BY MOUTH EVERY DAY (Patient not taking: Reported on 07/02/2023) 90 tablet 3   umeclidinium bromide (INCRUSE ELLIPTA) 62.5 MCG/ACT AEPB Inhale 1 puff into the lungs daily (rinse mouth with water after use) (Patient not taking: Reported on 07/02/2023) 30 each 3   No current facility-administered medications for this visit.  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   IFG (impaired fasting glucose) 01/2016   Pre-diabetes    RBBB (right bundle branch block)    Rheumatic fever    age 43 or 23, in hospital for a month, out of school for a yr   Seasonal allergies    Squamous cell carcinoma    Testicular hypofunction     Past Surgical History:  Procedure Laterality Date   EYE SURGERY     right eye  cataract surgery with lens implant   NASAL SEPTUM SURGERY     NOSE SURGERY     REVERSE SHOULDER  ARTHROPLASTY Right 01/11/2020   Procedure: REVERSE SHOULDER ARTHROPLASTY;  Surgeon: Jones Broom, MD;  Location: [facility];  Service: Orthopedics;  Laterality: Right;   SHOULDER ARTHROSCOPY WITH SUBACROMIAL DECOMPRESSION Right 08/07/2019   Procedure: SHOULDER ARTHROSCOPY WITH SUBACROMIAL DECOMPRESSION;  Surgeon: Jones Broom, MD;  Location: [facility];  Service: Orthopedics;  Laterality: Right;   SKIN CANCER EXCISION     basal and squamous cell    TONSILLECTOMY     VASECTOMY     WISDOM TOOTH EXTRACTION      Social History   Socioeconomic History   Marital status: Married    Spouse name: Not on file   Number of children: 3   Years of education: Not on file   Highest education level: Not on file  Occupational History    Comment: Retired  Tobacco Use   Smoking status: Former    Current packs/day: 0.00    Types: Cigarettes    Quit date: 12/14/1969    Years since quitting: 53.5   Smokeless tobacco: Never  Vaping Use   Vaping status: Never Used  Substance and Sexual Activity   Alcohol use: Yes    Alcohol/week: 2.0 standard drinks of alcohol    Types: 2 Cans of beer per week    Comment: social   Drug use: Never   Sexual activity: Not on file  Other Topics Concern   Not on file  Social History Narrative   Not on file   Social Determinants of Health   Financial Resource Strain: Not on file  Food Insecurity: Not on file  Transportation Needs: Not on file  Physical Activity: Not on file  Stress: Not on file  Social Connections: Unknown (03/17/2022)   Received from [facility]   Social Network    Social Network: Not on file  Intimate Partner Violence: Unknown (02/05/2022)   Received from [facility], [facility]   HITS    Physically Hurt: Not on file    Insult or Talk Down To: Not on file    Threaten Physical Harm: Not on file    Scream or Curse: Not on file    Family History  Problem Relation Age of Onset   Stroke Mother     Arthritis Mother    Cancer Mother        unknown    Dementia Mother    Arthritis Father    Mesothelioma Father    Arthritis Sister    Arthritis Sister    Bipolar disorder Sister     ROS: no fevers or chills,  productive cough, hemoptysis, dysphasia, odynophagia, melena, hematochezia, dysuria, hematuria, rash, seizure activity, orthopnea, PND, pedal edema, claudication. Remaining systems are negative.  Physical Exam: Well-developed well-nourished in no acute distress.  Skin is warm and dry.  HEENT is normal.  Neck is supple.  Chest is clear to auscultation with normal expansion.  Cardiovascular exam is regular rate and rhythm.  2/6 systolic murmur left sternal border.  S2 is not diminished. Abdominal exam nontender or distended. No masses palpated. Extremities show no edema. 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  Patient denies chest pain.  Continue statin.  Add aspirin 81 mg daily.  2 very mild aortic stenosis-noted on previous echocardiogram.  He does not sound to have severe aortic stenosis on exam.  Will likely repeat his echocardiogram when he returns in 1 year.  3 hypertension-blood pressure controlled.  Continue present medications.  4 palpitations-symptoms are controlled at present.  5 hyperlipidemia-continue Crestor and zetia.  Will have most recent lipids and liver forwarded to [place] from primary care.   Olga Millers, MD

## 2023-06-30 NOTE — Progress Notes (Deleted)
[facility] [address] [place] [address] Phone: [phone] Subjective:    I'm seeing this patient by the request  of:  Cory Ran, MD  CC:   [id number]  05/25/2023 Status post replacement.  He is going to follow-up with his orthopedic surgeon on this     Chronic problem with exacerbation and worsening symptoms.  Would like to discuss with a orthopedic surgeon to know what surgical intervention would look like.  We discussed with patient about the potential for PRP.  Can repeat injections every 3 months if needed.  Follow-up again in 2 to 3 months otherwise     Update 07/07/2023 Cory Barnett is a 77 y.o. male coming in with complaint of B knee and R shoulder pain. 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   IFG (impaired fasting glucose) 01/2016   Pre-diabetes    RBBB (right bundle branch block)    Rheumatic fever    age 80 or 30, in hospital for a month, out of school for a yr   Seasonal allergies    Squamous cell carcinoma    Testicular hypofunction    Past Surgical History:  Procedure Laterality Date   EYE SURGERY     right eye  cataract surgery with lens implant   NASAL SEPTUM SURGERY     NOSE SURGERY     REVERSE SHOULDER ARTHROPLASTY Right 01/11/2020   Procedure: REVERSE SHOULDER ARTHROPLASTY;  Surgeon: Jones Broom, MD;  Location: [facility];  Service: Orthopedics;  Laterality: Right;   SHOULDER ARTHROSCOPY WITH SUBACROMIAL DECOMPRESSION Right 08/07/2019   Procedure: SHOULDER ARTHROSCOPY WITH SUBACROMIAL DECOMPRESSION;  Surgeon: Jones Broom, MD;  Location: [facility];  Service: Orthopedics;  Laterality: Right;   SKIN CANCER EXCISION     basal and squamous cell     TONSILLECTOMY     VASECTOMY     WISDOM TOOTH EXTRACTION     Social History   Socioeconomic History   Marital status: Married    Spouse name: Not on file   Number of children: 3   Years of education: Not on file   Highest education level: Not on file  Occupational History    Comment: Retired  Tobacco Use   Smoking status: Former    Current packs/day: 0.00    Types: Cigarettes    Quit date: 12/14/1969    Years since quitting: 53.5   Smokeless tobacco: Never  Vaping Use   Vaping status: Never Used  Substance and Sexual Activity   Alcohol use: Yes    Alcohol/week: 2.0 standard drinks of alcohol    Types: 2 Cans of beer per week    Comment: social   Drug use: Never   Sexual activity: Not on file  Other Topics Concern   Not on file  Social History Narrative   Not on file   Social Determinants of Health   Financial Resource Strain: Not on file  Food Insecurity: Not on file  Transportation Needs: Not on file  Physical Activity: Not on file  Stress: Not on file  Social Connections: Unknown (03/17/2022)   Received from [facility],  [facility]   Social Network    Social Network: Not on file   Allergies  Allergen Reactions   Bee Venom Anaphylaxis   Moxifloxacin Shortness Of Breath   Family History  Problem Relation Age of Onset   Stroke Mother    Arthritis Mother    Cancer Mother        unknown    Dementia Mother    Arthritis Father    Mesothelioma Father    Arthritis Sister    Arthritis Sister    Bipolar disorder Sister      Current Outpatient Medications (Cardiovascular):    ezetimibe (ZETIA) 10 MG tablet, Take 1 tablet (10 mg total) by mouth daily.   losartan (COZAAR) 50 MG tablet, Take 1 tablet (50 mg total) by mouth at bedtime.   rosuvastatin (CRESTOR) 10 MG tablet, TAKE 1 TABLET BY MOUTH EVERY DAY   rosuvastatin (CRESTOR) 20 MG tablet, Take 1 tablet (20 mg total) by mouth daily.   sildenafil (REVATIO) 20 MG tablet, Take 1-5 tablets (20-100 mg  total) by mouth 30 minutes-4 hours prior to sexual activity as needed.   tadalafil (CIALIS) 5 MG tablet, Take 1 tablet (5 mg total) by mouth daily.  Current Outpatient Medications (Respiratory):    umeclidinium bromide (INCRUSE ELLIPTA) 62.5 MCG/ACT AEPB, Inhale 1 puff into the lungs daily (rinse mouth with water after use)    Current Outpatient Medications (Other):    amoxicillin (AMOXIL) 500 MG capsule, Take 4 capsules by mouth 1 hour prior to dental treatment   amoxicillin (AMOXIL) 500 MG capsule, Take 4 capsules by mouth 1 hour prior to dental treatment   Barberry-Oreg Grape-Goldenseal (BERBERINE COMPLEX PO), Take by mouth.   cefdinir (OMNICEF) 300 MG capsule, Take 1 capsule (300 mg total) by mouth 2 (two) times daily with food.   CHELATED MAGNESIUM PO, Take 2 tablets by mouth in the morning and at bedtime.    Cholecalciferol (VITAMIN D3) 1.25 MG (50000 UT) CAPS, Take by mouth.   Coenzyme Q10 (CO Q 10) 100 MG CAPS, Take 100 mg by mouth in the morning and at bedtime.    erythromycin ophthalmic ointment, Apply ointment to upper eyelids in the morning and at bedtime for 2 more weeks   escitalopram (LEXAPRO) 10 MG tablet, Take 1 tablet (10 mg total) by mouth daily.   Investigational omega-3-fatty acid/placebo capsule S0927*, Take 4 capsules by mouth 2 (two) times daily. Take with food.   Multiple Vitamin (MULTI-VITAMINS) TABS, Take 3 tablets by mouth in the morning and at bedtime.    nirmatrelvir/ritonavir EUA (PAXLOVID) TABS, Take 3 tablets by mouth 2 (two) times daily.   omeprazole (PRILOSEC) 40 MG capsule, Take 1 capsule by mouth daily   prednisoLONE acetate (PRED FORTE) 1 % ophthalmic suspension, Place 1 drop into the right eye 3 (three) times daily.   prednisoLONE acetate (PRED FORTE) 1 % ophthalmic suspension, Place 1 drop into the right eye 3 times daily.   Probiotic Product (PROBIOTIC PO), Take 1 capsule by mouth daily.   Saw Palmetto, Serenoa repens, (SAW PALMETTO PO), Take 2  capsules by mouth in the morning and at bedtime. * These medications belong to multiple therapeutic classes and are listed under each applicable group.   Reviewed prior external information including notes and imaging from  primary care provider As well as notes that were available from care everywhere and other healthcare systems.  Past medical history, social, surgical and family history all reviewed in electronic medical record.  No pertanent  information unless stated regarding to the chief complaint.   Review of Systems:  No headache, visual changes, nausea, vomiting, diarrhea, constipation, dizziness, abdominal pain, skin rash, fevers, chills, night sweats, weight loss, swollen lymph nodes, body aches, joint swelling, chest pain, shortness of breath, mood changes. POSITIVE muscle aches  Objective  There were no vitals taken for this visit.   General: No apparent distress alert and oriented x3 mood and affect normal, dressed appropriately.  HEENT: Pupils equal, extraocular movements intact  Respiratory: Patient's speak in full sentences and does not appear short of breath  Cardiovascular: No lower extremity edema, non tender, no erythema      Impression and Recommendations:

## 2023-07-02 ENCOUNTER — Encounter: Payer: Self-pay | Admitting: Cardiology

## 2023-07-02 ENCOUNTER — Ambulatory Visit: Payer: Medicare Other | Attending: Cardiology | Admitting: Cardiology

## 2023-07-02 VITALS — BP 126/80 | HR 69 | Ht 74.0 in | Wt 213.2 lb

## 2023-07-02 DIAGNOSIS — I350 Nonrheumatic aortic (valve) stenosis: Secondary | ICD-10-CM | POA: Insufficient documentation

## 2023-07-02 DIAGNOSIS — Z136 Encounter for screening for cardiovascular disorders: Secondary | ICD-10-CM | POA: Diagnosis not present

## 2023-07-02 DIAGNOSIS — I2510 Atherosclerotic heart disease of native coronary artery without angina pectoris: Secondary | ICD-10-CM | POA: Diagnosis not present

## 2023-07-02 DIAGNOSIS — E7800 Pure hypercholesterolemia, unspecified: Secondary | ICD-10-CM | POA: Insufficient documentation

## 2023-07-02 DIAGNOSIS — I10 Essential (primary) hypertension: Secondary | ICD-10-CM | POA: Diagnosis not present

## 2023-07-02 MED ORDER — ASPIRIN 81 MG PO TBEC: 81.0000 mg | DELAYED_RELEASE_TABLET | Freq: Every day | ORAL | Status: DC

## 2023-07-02 NOTE — Patient Instructions (Signed)
Medication Instructions:  Start Aspirin 81 mg daily Continue all other medications *If you need a refill on your cardiac medications before your next appointment, please call your pharmacy*   Lab Work: None ordered   Testing/Procedures: None ordered   Follow-Up: At [facility], you and your health needs are our priority.  As part of our continuing mission to provide you with exceptional heart care, we have created designated Provider Care Teams.  These Care Teams include your primary Cardiologist (physician) and Advanced Practice Providers (APPs -  Physician Assistants and Nurse Practitioners) who all work together to provide you with the care you need, when you need it.  We recommend signing up for the patient portal called "MyChart".  Sign up information is provided on this After Visit Summary.  MyChart is used to connect with patients for Virtual Visits (Telemedicine).  Patients are able to view lab/test results, encounter notes, upcoming appointments, etc.  Non-urgent messages can be sent to your provider as well.   To learn more about what you can do with MyChart, go to [email or web address].    Your next appointment:  1 year    Call in May to schedule August appointment     Provider:  Winfield Rast

## 2023-07-04 LAB — LAB REPORT - SCANNED
A1c: 6.3
EGFR: 82

## 2023-07-06 ENCOUNTER — Encounter: Payer: Self-pay | Admitting: Internal Medicine

## 2023-07-07 ENCOUNTER — Ambulatory Visit: Payer: Medicare Other | Admitting: Family Medicine

## 2023-07-21 DIAGNOSIS — C44729 Squamous cell carcinoma of skin of left lower limb, including hip: Secondary | ICD-10-CM | POA: Diagnosis not present

## 2023-07-21 DIAGNOSIS — D485 Neoplasm of uncertain behavior of skin: Secondary | ICD-10-CM | POA: Diagnosis not present

## 2023-07-27 ENCOUNTER — Other Ambulatory Visit (HOSPITAL_COMMUNITY): Payer: Self-pay

## 2023-07-27 MED ORDER — EZETIMIBE 10 MG PO TABS
10.0000 mg | ORAL_TABLET | Freq: Every day | ORAL | 3 refills | Status: DC
  Filled 2023-07-27: qty 90, 90d supply, fill #0
  Filled 2023-10-24: qty 90, 90d supply, fill #1
  Filled 2024-01-20: qty 90, 90d supply, fill #2
  Filled 2024-04-22: qty 90, 90d supply, fill #3

## 2023-07-27 MED ORDER — LOSARTAN POTASSIUM 50 MG PO TABS
50.0000 mg | ORAL_TABLET | Freq: Every evening | ORAL | 3 refills | Status: DC
  Filled 2023-07-27: qty 90, 90d supply, fill #0
  Filled 2023-12-08: qty 90, 90d supply, fill #1
  Filled 2024-03-06: qty 90, 90d supply, fill #2
  Filled 2024-06-02 – 2024-06-05 (×2): qty 90, 90d supply, fill #3

## 2023-07-27 MED ORDER — ESCITALOPRAM OXALATE 10 MG PO TABS
10.0000 mg | ORAL_TABLET | Freq: Every day | ORAL | 3 refills | Status: DC
  Filled 2023-07-27: qty 90, 90d supply, fill #0
  Filled 2023-10-24: qty 90, 90d supply, fill #1
  Filled 2024-01-20: qty 90, 90d supply, fill #2
  Filled 2024-04-22: qty 90, 90d supply, fill #3

## 2023-08-07 DIAGNOSIS — Z23 Encounter for immunization: Secondary | ICD-10-CM | POA: Diagnosis not present

## 2023-08-11 DIAGNOSIS — C44729 Squamous cell carcinoma of skin of left lower limb, including hip: Secondary | ICD-10-CM | POA: Diagnosis not present

## 2023-08-17 DIAGNOSIS — E785 Hyperlipidemia, unspecified: Secondary | ICD-10-CM | POA: Diagnosis not present

## 2023-08-17 DIAGNOSIS — I10 Essential (primary) hypertension: Secondary | ICD-10-CM | POA: Diagnosis not present

## 2023-08-17 DIAGNOSIS — R972 Elevated prostate specific antigen [PSA]: Secondary | ICD-10-CM | POA: Diagnosis not present

## 2023-08-17 DIAGNOSIS — E291 Testicular hypofunction: Secondary | ICD-10-CM | POA: Diagnosis not present

## 2023-08-17 DIAGNOSIS — Z1389 Encounter for screening for other disorder: Secondary | ICD-10-CM | POA: Diagnosis not present

## 2023-08-23 ENCOUNTER — Other Ambulatory Visit (HOSPITAL_COMMUNITY): Payer: Self-pay

## 2023-08-26 DIAGNOSIS — Z0000 Encounter for general adult medical examination without abnormal findings: Secondary | ICD-10-CM | POA: Diagnosis not present

## 2023-08-26 DIAGNOSIS — R82998 Other abnormal findings in urine: Secondary | ICD-10-CM | POA: Diagnosis not present

## 2023-08-26 DIAGNOSIS — I700 Atherosclerosis of aorta: Secondary | ICD-10-CM | POA: Diagnosis not present

## 2023-08-26 DIAGNOSIS — Z1331 Encounter for screening for depression: Secondary | ICD-10-CM | POA: Diagnosis not present

## 2023-08-26 DIAGNOSIS — M791 Myalgia, unspecified site: Secondary | ICD-10-CM | POA: Diagnosis not present

## 2023-08-26 DIAGNOSIS — R7301 Impaired fasting glucose: Secondary | ICD-10-CM | POA: Diagnosis not present

## 2023-08-26 DIAGNOSIS — E785 Hyperlipidemia, unspecified: Secondary | ICD-10-CM | POA: Diagnosis not present

## 2023-08-26 DIAGNOSIS — I2510 Atherosclerotic heart disease of native coronary artery without angina pectoris: Secondary | ICD-10-CM | POA: Diagnosis not present

## 2023-08-26 DIAGNOSIS — F419 Anxiety disorder, unspecified: Secondary | ICD-10-CM | POA: Diagnosis not present

## 2023-08-26 DIAGNOSIS — I440 Atrioventricular block, first degree: Secondary | ICD-10-CM | POA: Diagnosis not present

## 2023-08-26 DIAGNOSIS — R972 Elevated prostate specific antigen [PSA]: Secondary | ICD-10-CM | POA: Diagnosis not present

## 2023-08-26 DIAGNOSIS — I10 Essential (primary) hypertension: Secondary | ICD-10-CM | POA: Diagnosis not present

## 2023-08-26 DIAGNOSIS — M13869 Other specified arthritis, unspecified knee: Secondary | ICD-10-CM | POA: Diagnosis not present

## 2023-08-30 ENCOUNTER — Other Ambulatory Visit (HOSPITAL_COMMUNITY): Payer: Self-pay

## 2023-08-30 DIAGNOSIS — R1032 Left lower quadrant pain: Secondary | ICD-10-CM | POA: Diagnosis not present

## 2023-08-30 DIAGNOSIS — K5790 Diverticulosis of intestine, part unspecified, without perforation or abscess without bleeding: Secondary | ICD-10-CM | POA: Diagnosis not present

## 2023-08-30 DIAGNOSIS — K5792 Diverticulitis of intestine, part unspecified, without perforation or abscess without bleeding: Secondary | ICD-10-CM | POA: Diagnosis not present

## 2023-08-30 DIAGNOSIS — R109 Unspecified abdominal pain: Secondary | ICD-10-CM | POA: Diagnosis not present

## 2023-08-30 MED ORDER — AMOXICILLIN-POT CLAVULANATE 875-125 MG PO TABS
1.0000 | ORAL_TABLET | Freq: Two times a day (BID) | ORAL | 0 refills | Status: AC
  Filled 2023-08-30: qty 20, 10d supply, fill #0

## 2023-08-31 ENCOUNTER — Encounter: Payer: Self-pay | Admitting: Cardiology

## 2023-09-06 ENCOUNTER — Other Ambulatory Visit (HOSPITAL_COMMUNITY): Payer: Self-pay

## 2023-09-13 ENCOUNTER — Other Ambulatory Visit (HOSPITAL_COMMUNITY): Payer: Self-pay

## 2023-09-13 DIAGNOSIS — H9221 Otorrhagia, right ear: Secondary | ICD-10-CM | POA: Diagnosis not present

## 2023-09-13 DIAGNOSIS — H938X2 Other specified disorders of left ear: Secondary | ICD-10-CM | POA: Diagnosis not present

## 2023-09-13 MED ORDER — CIPROFLOXACIN-DEXAMETHASONE 0.3-0.1 % OT SUSP
4.0000 [drp] | Freq: Two times a day (BID) | OTIC | 0 refills | Status: AC
  Filled 2023-09-13: qty 7.5, 15d supply, fill #0

## 2023-09-20 DIAGNOSIS — K5792 Diverticulitis of intestine, part unspecified, without perforation or abscess without bleeding: Secondary | ICD-10-CM | POA: Diagnosis not present

## 2023-09-20 DIAGNOSIS — K5790 Diverticulosis of intestine, part unspecified, without perforation or abscess without bleeding: Secondary | ICD-10-CM | POA: Diagnosis not present

## 2023-09-21 ENCOUNTER — Other Ambulatory Visit: Payer: Self-pay | Admitting: Family Medicine

## 2023-09-21 ENCOUNTER — Ambulatory Visit (HOSPITAL_BASED_OUTPATIENT_CLINIC_OR_DEPARTMENT_OTHER)
Admission: RE | Admit: 2023-09-21 | Discharge: 2023-09-21 | Disposition: A | Discharge status: Home / Self Care | Payer: Medicare Other | Source: Ambulatory Visit | Attending: Family Medicine | Admitting: Family Medicine

## 2023-09-21 DIAGNOSIS — K5730 Diverticulosis of large intestine without perforation or abscess without bleeding: Secondary | ICD-10-CM | POA: Diagnosis not present

## 2023-09-21 DIAGNOSIS — K625 Hemorrhage of anus and rectum: Secondary | ICD-10-CM | POA: Diagnosis not present

## 2023-09-21 DIAGNOSIS — K5792 Diverticulitis of intestine, part unspecified, without perforation or abscess without bleeding: Secondary | ICD-10-CM

## 2023-09-21 DIAGNOSIS — K429 Umbilical hernia without obstruction or gangrene: Secondary | ICD-10-CM | POA: Diagnosis not present

## 2023-09-21 DIAGNOSIS — N400 Enlarged prostate without lower urinary tract symptoms: Secondary | ICD-10-CM | POA: Diagnosis not present

## 2023-09-21 MED ORDER — IOHEXOL 300 MG/ML  SOLN
100.0000 mL | Freq: Once | INTRAMUSCULAR | Status: AC | PRN
  Administered 2023-09-21: 100 mL via INTRAVENOUS

## 2023-09-22 DIAGNOSIS — Z9622 Myringotomy tube(s) status: Secondary | ICD-10-CM | POA: Diagnosis not present

## 2023-09-22 DIAGNOSIS — H9221 Otorrhagia, right ear: Secondary | ICD-10-CM | POA: Diagnosis not present

## 2023-10-07 ENCOUNTER — Other Ambulatory Visit (HOSPITAL_COMMUNITY): Payer: Self-pay

## 2023-10-07 DIAGNOSIS — H9221 Otorrhagia, right ear: Secondary | ICD-10-CM | POA: Diagnosis not present

## 2023-10-07 DIAGNOSIS — Z9622 Myringotomy tube(s) status: Secondary | ICD-10-CM | POA: Diagnosis not present

## 2023-10-07 MED ORDER — OFLOXACIN 0.3 % OP SOLN
OPHTHALMIC | 0 refills | Status: AC
  Filled 2023-10-07: qty 5, 5d supply, fill #0

## 2023-10-07 MED ORDER — TADALAFIL 5 MG PO TABS
5.0000 mg | ORAL_TABLET | Freq: Every day | ORAL | 3 refills | Status: AC
  Filled 2023-10-07: qty 90, 90d supply, fill #0
  Filled 2024-03-06: qty 90, 90d supply, fill #1
  Filled 2024-06-02 – 2024-06-05 (×2): qty 90, 90d supply, fill #2
  Filled 2024-08-30: qty 90, 90d supply, fill #3

## 2023-10-13 ENCOUNTER — Other Ambulatory Visit (HOSPITAL_COMMUNITY): Payer: Self-pay

## 2023-10-13 NOTE — Progress Notes (Signed)
[name] [address] [place] [address] Phone: [phone] Subjective:   INadine Counts, am serving as a scribe for Dr. Antoine Primas.  I'm seeing this patient by the request  of:  Rodrigo Ran, MD  CC: Bilateral knee pain and hip pain  [id number]  05/25/2023 Status post replacement.  He is going to follow-up with his orthopedic surgeon on this     Chronic problem with exacerbation and worsening symptoms.  Would like to discuss with a orthopedic surgeon to know what surgical intervention would look like.  We discussed with patient about the potential for PRP.  Can repeat injections every 3 months if needed.  Follow-up again in 2 to 3 months otherwise     Updated 10/15/2023 Cory Barnett is a 77 y.o. male coming in with complaint of B knee and hip pain. Patient states that his knee pain increases with stair climbing.   Hip pain is doing ok. Better than the knees. Painful at night.        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   IFG (impaired fasting glucose) 01/2016   Pre-diabetes    RBBB (right bundle branch block)    Rheumatic fever    age 63 or 50, in hospital for a month, out of school for a yr   Seasonal allergies    Squamous cell carcinoma    Testicular hypofunction    Past Surgical History:  Procedure Laterality Date   EYE SURGERY     right eye  cataract surgery with lens implant   NASAL SEPTUM SURGERY     NOSE SURGERY     REVERSE SHOULDER ARTHROPLASTY Right 01/11/2020   Procedure: REVERSE SHOULDER ARTHROPLASTY;  Surgeon: Jones Broom, MD;  Location: [facility];  Service: Orthopedics;  Laterality: Right;   SHOULDER ARTHROSCOPY WITH SUBACROMIAL DECOMPRESSION Right 08/07/2019   Procedure: SHOULDER ARTHROSCOPY  WITH SUBACROMIAL DECOMPRESSION;  Surgeon: Jones Broom, MD;  Location: [facility];  Service: Orthopedics;  Laterality: Right;   SKIN CANCER EXCISION     basal and squamous cell    TONSILLECTOMY     VASECTOMY     WISDOM TOOTH EXTRACTION     Social History   Socioeconomic History   Marital status: Married    Spouse name: Not on file   Number of children: 3   Years of education: Not on file   Highest education level: Not on file  Occupational History    Comment: Retired  Tobacco Use   Smoking status: Former    Current packs/day: 0.00    Types: Cigarettes    Quit date: 12/14/1969    Years since quitting: 53.8   Smokeless tobacco: Never  Vaping Use   Vaping status: Never Used  Substance and Sexual Activity   Alcohol use: Yes    Alcohol/week: 2.0 standard drinks of alcohol    Types: 2 Cans of beer per week    Comment: social   Drug use: Never   Sexual activity: Not on file  Other Topics Concern   Not on file  Social History Narrative   Not on file   Social Drivers of Corporate investment banker  Strain: Not on file  Food Insecurity: Not on file  Transportation Needs: Not on file  Physical Activity: Not on file  Stress: Not on file  Social Connections: Unknown (03/17/2022)   Received from [facility], [facility]   Social Network    Social Network: Not on file   Allergies  Allergen Reactions   Bee Venom Anaphylaxis   Moxifloxacin Shortness Of Breath   Family History  Problem Relation Age of Onset   Stroke Mother    Arthritis Mother    Cancer Mother        unknown    Dementia Mother    Arthritis Father    Mesothelioma Father    Arthritis Sister    Arthritis Sister    Bipolar disorder Sister      Current Outpatient Medications (Cardiovascular):    ezetimibe (ZETIA) 10 MG tablet, Take 1 tablet (10 mg total) by mouth daily.   losartan (COZAAR) 50 MG tablet, Take 1 tablet (50 mg total) by mouth at bedtime.   rosuvastatin (CRESTOR) 10 MG  tablet, TAKE 1 TABLET BY MOUTH EVERY DAY   rosuvastatin (CRESTOR) 20 MG tablet, Take 1 tablet (20 mg total) by mouth daily.   sildenafil (REVATIO) 20 MG tablet, Take 1-5 tablets (20-100 mg total) by mouth 30 minutes-4 hours prior to sexual activity as needed.   tadalafil (CIALIS) 5 MG tablet, Take 1 tablet (5 mg total) by mouth daily.   tadalafil (CIALIS) 5 MG tablet, Take 1 tablet (5 mg total) by mouth daily.  Current Outpatient Medications (Respiratory):    umeclidinium bromide (INCRUSE ELLIPTA) 62.5 MCG/ACT AEPB, Inhale 1 puff into the lungs daily (rinse mouth with water after use)  Current Outpatient Medications (Analgesics):    aspirin EC 81 MG tablet, Take 1 tablet (81 mg total) by mouth daily. Swallow whole.   Current Outpatient Medications (Other):    amoxicillin (AMOXIL) 500 MG capsule, Take 4 capsules by mouth 1 hour prior to dental treatment   amoxicillin-clavulanate (AUGMENTIN) 875-125 MG tablet, Take 1 tablet by mouth 2 (two) times daily  with food for 10 days   Barberry-Oreg Grape-Goldenseal (BERBERINE COMPLEX PO), Take by mouth.   cefdinir (OMNICEF) 300 MG capsule, Take 1 capsule (300 mg total) by mouth 2 (two) times daily with food.   CHELATED MAGNESIUM PO, Take 2 tablets by mouth in the morning and at bedtime.    Cholecalciferol (VITAMIN D3) 1.25 MG (50000 UT) CAPS, Take by mouth.   ciprofloxacin-dexamethasone (CIPRODEX) OTIC suspension, Place 4 drops into the right ear 2 (two) times daily for 3 days   Coenzyme Q10 (CO Q 10) 100 MG CAPS, Take 100 mg by mouth in the morning and at bedtime.    escitalopram (LEXAPRO) 10 MG tablet, Take 1 tablet (10 mg total) by mouth daily.   Investigational omega-3-fatty acid/placebo capsule S0927*, Take 4 capsules by mouth 2 (two) times daily. Take with food.   Multiple Vitamin (MULTI-VITAMINS) TABS, Take 3 tablets by mouth in the morning and at bedtime.    nirmatrelvir/ritonavir EUA (PAXLOVID) TABS, Take 3 tablets by mouth 2 (two) times  daily.   ofloxacin (OCUFLOX) 0.3 % ophthalmic solution, Apply 4 drops in right ear twice daily for 5 days.   prednisoLONE acetate (PRED FORTE) 1 % ophthalmic suspension, Place 1 drop into the right eye 3 (three) times daily.   prednisoLONE acetate (PRED FORTE) 1 % ophthalmic suspension, Place 1 drop into the right eye 3 times daily.   Probiotic Product (PROBIOTIC PO), Take  1 capsule by mouth daily.   Saw Palmetto, Serenoa repens, (SAW PALMETTO PO), Take 2 capsules by mouth in the morning and at bedtime.   amoxicillin (AMOXIL) 500 MG capsule, Take 4 capsules by mouth 1 hour prior to dental treatment   erythromycin ophthalmic ointment, Apply ointment to upper eyelids in the morning and at bedtime for 2 more weeks   omeprazole (PRILOSEC) 40 MG capsule, Take 1 capsule by mouth daily * These medications belong to multiple therapeutic classes and are listed under each applicable group.   Reviewed prior external information including notes and imaging from  primary care provider As well as notes that were available from care everywhere and other healthcare systems.  Past medical history, social, surgical and family history all reviewed in electronic medical record.  No pertanent information unless stated regarding to the chief complaint.   Review of Systems:  No headache, visual changes, nausea, vomiting, diarrhea, constipation, dizziness, abdominal pain, skin rash, fevers, chills, night sweats, weight loss, swollen lymph nodes, body aches, joint swelling, chest pain, shortness of breath, mood changes. POSITIVE muscle aches  Objective  Blood pressure 122/88, pulse 96, height 6\' 2"  (1.88 m), weight 221 lb (100.2 kg), SpO2 96%.   General: No apparent distress alert and oriented x3 mood and affect normal, dressed appropriately.  HEENT: Pupils equal, extraocular movements intact  Respiratory: Patient's speak in full sentences and does not appear short of breath  Cardiovascular: No lower extremity  edema, non tender, no erythema  Antalgic gait noted.  Tenderness to palpation over the knees bilaterally.  Does have some trace effusion noted left greater than right.  More instability noted with the left knee than the right knee.  Limited muscular skeletal ultrasound was performed and interpreted by Antoine Primas, M   Limited ultrasound does show the patient does have mostly patellofemoral arthritic changes noted.  Seems to be left greater than right.   Impression: Patellofemoral arthritis   Impression and Recommendations:    The above documentation has been reviewed and is accurate and complete Judi Saa, DO

## 2023-10-15 ENCOUNTER — Other Ambulatory Visit: Payer: Self-pay

## 2023-10-15 ENCOUNTER — Ambulatory Visit (INDEPENDENT_AMBULATORY_CARE_PROVIDER_SITE_OTHER): Payer: Medicare Other | Admitting: Family Medicine

## 2023-10-15 ENCOUNTER — Other Ambulatory Visit (HOSPITAL_COMMUNITY): Payer: Self-pay

## 2023-10-15 ENCOUNTER — Encounter: Payer: Self-pay | Admitting: Family Medicine

## 2023-10-15 VITALS — BP 122/88 | HR 96 | Ht 74.0 in | Wt 221.0 lb

## 2023-10-15 DIAGNOSIS — M25552 Pain in left hip: Secondary | ICD-10-CM

## 2023-10-15 DIAGNOSIS — M170 Bilateral primary osteoarthritis of knee: Secondary | ICD-10-CM

## 2023-10-15 DIAGNOSIS — M25551 Pain in right hip: Secondary | ICD-10-CM

## 2023-10-15 NOTE — Patient Instructions (Signed)
Schedule PRP for Dec 23rd. Double book 2:30pm please and the another appointment 6 weeks after that

## 2023-10-15 NOTE — Assessment & Plan Note (Signed)
Left knee pain seems to be worse.  We have done steroid injections as well as viscosupplementation.  Discussed potentially repeating the steroid injection which I think will be beneficial for this individual.  We did discuss the possibility of PRP which patient is going to consider in the near future.  We discussed continuing the topical anti-inflammatories.  Patient will follow-up with me again as scheduled for the PRP.  Total time discussed with patient 31 minutes

## 2023-10-18 DIAGNOSIS — K2970 Gastritis, unspecified, without bleeding: Secondary | ICD-10-CM | POA: Diagnosis not present

## 2023-10-18 DIAGNOSIS — E348 Other specified endocrine disorders: Secondary | ICD-10-CM | POA: Diagnosis not present

## 2023-10-18 DIAGNOSIS — M25561 Pain in right knee: Secondary | ICD-10-CM | POA: Diagnosis not present

## 2023-10-18 DIAGNOSIS — R7303 Prediabetes: Secondary | ICD-10-CM | POA: Diagnosis not present

## 2023-10-22 NOTE — Progress Notes (Unsigned)
[facility] [address] [place] [address] Phone: [phone] Subjective:   Cory [facility]ett, am serving as a scribe for Dr. Antoine Primas.  I'm seeing this patient by the request  of:  Rodrigo Ran, MD  CC: knee pain   [id number]  10/15/2023 Left knee pain seems to be worse.  We have done steroid injections as well as viscosupplementation.  Discussed potentially repeating the steroid injection which I think will be beneficial for this individual.  We did discuss the possibility of PRP which patient is going to consider in the near future.  We discussed continuing the topical anti-inflammatories.  Patient will follow-up with me again as scheduled for the PRP.  Total time discussed with patient 31 minutes     Updated 10/25/2023 Cory Barnett is a 77 y.o. male coming in with complaint of L knee pain. Here for PRP. Patient states that he is the same as last visit.        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   IFG (impaired fasting glucose) 01/2016   Pre-diabetes    RBBB (right bundle branch block)    Rheumatic fever    age 18 or 9, in hospital for a month, out of school for a yr   Seasonal allergies    Squamous cell carcinoma    Testicular hypofunction    Past Surgical History:  Procedure Laterality Date   EYE SURGERY     right eye  cataract surgery with lens implant   NASAL SEPTUM SURGERY     NOSE SURGERY     REVERSE SHOULDER ARTHROPLASTY Right 01/11/2020   Procedure: REVERSE SHOULDER ARTHROPLASTY;  Surgeon: Jones Broom, MD;  Location: [facility];  Service: Orthopedics;  Laterality: Right;   SHOULDER ARTHROSCOPY WITH SUBACROMIAL DECOMPRESSION Right 08/07/2019   Procedure: SHOULDER ARTHROSCOPY WITH SUBACROMIAL  DECOMPRESSION;  Surgeon: Jones Broom, MD;  Location: [facility];  Service: Orthopedics;  Laterality: Right;   SKIN CANCER EXCISION     basal and squamous cell    TONSILLECTOMY     VASECTOMY     WISDOM TOOTH EXTRACTION     Social History   Socioeconomic History   Marital status: Married    Spouse name: Not on file   Number of children: 3   Years of education: Not on file   Highest education level: Not on file  Occupational History    Comment: Retired  Tobacco Use   Smoking status: Former    Current packs/day: 0.00    Types: Cigarettes    Quit date: 12/14/1969    Years since quitting: 53.8   Smokeless tobacco: Never  Vaping Use   Vaping status: Never Used  Substance and Sexual Activity   Alcohol use: Yes    Alcohol/week: 2.0 standard drinks of alcohol    Types: 2 Cans of beer per week    Comment: social   Drug use: Never   Sexual activity: Not on file  Other Topics Concern   Not on file  Social History Narrative   Not on file   Social Drivers of Health   Financial Resource Strain: Not on file  Food Insecurity: Not on  file  Transportation Needs: Not on file  Physical Activity: Not on file  Stress: Not on file  Social Connections: Unknown (03/17/2022)   Received from [facility], [facility]   Social Network    Social Network: Not on file   Allergies  Allergen Reactions   Bee Venom Anaphylaxis   Moxifloxacin Shortness Of Breath   Family History  Problem Relation Age of Onset   Stroke Mother    Arthritis Mother    Cancer Mother        unknown    Dementia Mother    Arthritis Father    Mesothelioma Father    Arthritis Sister    Arthritis Sister    Bipolar disorder Sister      Objective  Blood pressure 126/78, pulse 64, height 6\' 2"  (1.88 m), SpO2 97%.   General: No apparent distress alert and oriented x3 mood and affect normal, dressed appropriately.   After informed written and verbal consent, patient was seated on exam table.  Left knee was prepped with alcohol swab and utilizing anterolateral approach, patient's left knee space was injected with 2 cc of 0.5% Marcaine and then injected with 5 cc of PRP leukocyte poor patient tolerated the procedure well without immediate complications.    Impression and Recommendations:    The above documentation has been reviewed and is accurate and complete Judi Saa, DO

## 2023-10-25 ENCOUNTER — Ambulatory Visit (INDEPENDENT_AMBULATORY_CARE_PROVIDER_SITE_OTHER): Payer: Self-pay | Admitting: Family Medicine

## 2023-10-25 VITALS — BP 126/78 | HR 64 | Ht 74.0 in

## 2023-10-25 DIAGNOSIS — M170 Bilateral primary osteoarthritis of knee: Secondary | ICD-10-CM

## 2023-10-25 NOTE — Assessment & Plan Note (Signed)
PRP given, post PRP instructions given.

## 2023-10-25 NOTE — Patient Instructions (Signed)
No ice or IBU for 3 days Heat and Tylenol are ok See me again in 6 weeks 

## 2023-10-26 ENCOUNTER — Other Ambulatory Visit (HOSPITAL_COMMUNITY): Payer: Self-pay

## 2023-11-03 ENCOUNTER — Telehealth: Payer: Medicare Other

## 2023-12-03 DIAGNOSIS — C44629 Squamous cell carcinoma of skin of left upper limb, including shoulder: Secondary | ICD-10-CM | POA: Diagnosis not present

## 2023-12-03 DIAGNOSIS — L820 Inflamed seborrheic keratosis: Secondary | ICD-10-CM | POA: Diagnosis not present

## 2023-12-03 DIAGNOSIS — D0462 Carcinoma in situ of skin of left upper limb, including shoulder: Secondary | ICD-10-CM | POA: Diagnosis not present

## 2023-12-03 DIAGNOSIS — L570 Actinic keratosis: Secondary | ICD-10-CM | POA: Diagnosis not present

## 2023-12-03 DIAGNOSIS — D225 Melanocytic nevi of trunk: Secondary | ICD-10-CM | POA: Diagnosis not present

## 2023-12-03 DIAGNOSIS — L821 Other seborrheic keratosis: Secondary | ICD-10-CM | POA: Diagnosis not present

## 2023-12-03 DIAGNOSIS — D485 Neoplasm of uncertain behavior of skin: Secondary | ICD-10-CM | POA: Diagnosis not present

## 2023-12-03 DIAGNOSIS — L578 Other skin changes due to chronic exposure to nonionizing radiation: Secondary | ICD-10-CM | POA: Diagnosis not present

## 2023-12-03 DIAGNOSIS — Z85828 Personal history of other malignant neoplasm of skin: Secondary | ICD-10-CM | POA: Diagnosis not present

## 2023-12-08 ENCOUNTER — Other Ambulatory Visit (HOSPITAL_COMMUNITY): Payer: Self-pay

## 2023-12-08 NOTE — Progress Notes (Signed)
 [facility] [address] [place] [address] Phone: [phone] Subjective:   Cory Barnett, am serving as a scribe for Dr. Arthea Claudene.  I'm seeing this patient by the request  of:  Shayne Anes, MD  CC: Knee pain follow-up  [id number]  10/25/2023 PRP given, post PRP instructions given.     Updated 12/10/2023 Cory Barnett is a 78 y.o. male coming in with complaint of knee pain. PRP follow up, known arthritic changes of the knee.  Patient states doing very well. Pain has almost diminished.       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   IFG (impaired fasting glucose) 01/2016   Pre-diabetes    RBBB (right bundle branch block)    Rheumatic fever    age 38 or 51, in hospital for a month, out of school for a yr   Seasonal allergies    Squamous cell carcinoma    Testicular hypofunction    Past Surgical History:  Procedure Laterality Date   EYE SURGERY     right eye  cataract surgery with lens implant   NASAL SEPTUM SURGERY     NOSE SURGERY     REVERSE SHOULDER ARTHROPLASTY Right 01/11/2020   Procedure: REVERSE SHOULDER ARTHROPLASTY;  Surgeon: Dozier Soulier, MD;  Location: [facility];  Service: Orthopedics;  Laterality: Right;   SHOULDER ARTHROSCOPY WITH SUBACROMIAL DECOMPRESSION Right 08/07/2019   Procedure: SHOULDER ARTHROSCOPY WITH SUBACROMIAL DECOMPRESSION;  Surgeon: Dozier Soulier, MD;  Location: [facility];  Service: Orthopedics;  Laterality: Right;   SKIN CANCER EXCISION     basal and squamous cell    TONSILLECTOMY     VASECTOMY     WISDOM TOOTH EXTRACTION     Social History   Socioeconomic History   Marital status: Married    Spouse name: Not on file   Number of children: 3   Years of  education: Not on file   Highest education level: Not on file  Occupational History    Comment: Retired  Tobacco Use   Smoking status: Former    Current packs/day: 0.00    Types: Cigarettes    Quit date: 12/14/1969    Years since quitting: 54.0   Smokeless tobacco: Never  Vaping Use   Vaping status: Never Used  Substance and Sexual Activity   Alcohol  use: Yes    Alcohol /week: 2.0 standard drinks of alcohol     Types: 2 Cans of beer per week    Comment: social   Drug use: Never   Sexual activity: Not on file  Other Topics Concern   Not on file  Social History Narrative   Not on file   Social Drivers of Health   Financial Resource Strain: Not on file  Food Insecurity: Not on file  Transportation Needs: Not on file  Physical Activity: Not on file  Stress: Not on file  Social Connections: Unknown (03/17/2022)   Received from [facility], [facility]   Social Network    Social Network: Not on file   Allergies  Allergen Reactions   Bee Venom Anaphylaxis   Moxifloxacin Shortness Of Breath   Family History  Problem Relation Age  of Onset   Stroke Mother    Arthritis Mother    Cancer Mother        unknown    Dementia Mother    Arthritis Father    Mesothelioma Father    Arthritis Sister    Arthritis Sister    Bipolar disorder Sister      Current Outpatient Medications (Cardiovascular):    ezetimibe  (ZETIA ) 10 MG tablet, Take 1 tablet (10 mg total) by mouth daily.   losartan  (COZAAR ) 50 MG tablet, Take 1 tablet (50 mg total) by mouth at bedtime.   rosuvastatin  (CRESTOR ) 10 MG tablet, TAKE 1 TABLET BY MOUTH EVERY DAY   rosuvastatin  (CRESTOR ) 20 MG tablet, Take 1 tablet (20 mg total) by mouth daily.   sildenafil  (REVATIO ) 20 MG tablet, Take 1-5 tablets (20-100 mg total) by mouth 30 minutes-4 hours prior to sexual activity as needed.   tadalafil  (CIALIS ) 5 MG tablet, Take 1 tablet (5 mg total) by mouth daily.   tadalafil  (CIALIS ) 5 MG tablet, Take 1 tablet (5 mg  total) by mouth daily.  Current Outpatient Medications (Respiratory):    umeclidinium bromide  (INCRUSE ELLIPTA ) 62.5 MCG/ACT AEPB, Inhale 1 puff into the lungs daily (rinse mouth with water  after use)  Current Outpatient Medications (Analgesics):    aspirin  EC 81 MG tablet, Take 1 tablet (81 mg total) by mouth daily. Swallow whole.   Current Outpatient Medications (Other):    amoxicillin  (AMOXIL ) 500 MG capsule, Take 4 capsules by mouth 1 hour prior to dental treatment   amoxicillin -clavulanate (AUGMENTIN ) 875-125 MG tablet, Take 1 tablet by mouth 2 (two) times daily  with food for 10 days   Barberry-Oreg Grape-Goldenseal (BERBERINE COMPLEX PO), Take by mouth.   cefdinir  (OMNICEF ) 300 MG capsule, Take 1 capsule (300 mg total) by mouth 2 (two) times daily with food.   CHELATED MAGNESIUM PO, Take 2 tablets by mouth in the morning and at bedtime.    Cholecalciferol (VITAMIN D3) 1.25 MG (50000 UT) CAPS, Take by mouth.   ciprofloxacin -dexamethasone  (CIPRODEX ) OTIC suspension, Place 4 drops into the right ear 2 (two) times daily for 3 days   Coenzyme Q10 (CO Q 10) 100 MG CAPS, Take 100 mg by mouth in the morning and at bedtime.    escitalopram  (LEXAPRO ) 10 MG tablet, Take 1 tablet (10 mg total) by mouth daily.   Investigational omega-3-fatty acid/placebo capsule S0927*, Take 4 capsules by mouth 2 (two) times daily. Take with food.   Multiple Vitamin (MULTI-VITAMINS) TABS, Take 3 tablets by mouth in the morning and at bedtime.    nirmatrelvir /ritonavir  EUA (PAXLOVID ) TABS, Take 3 tablets by mouth 2 (two) times daily.   ofloxacin  (OCUFLOX ) 0.3 % ophthalmic solution, Apply 4 drops in right ear twice daily for 5 days.   prednisoLONE  acetate (PRED FORTE ) 1 % ophthalmic suspension, Place 1 drop into the right eye 3 (three) times daily.   prednisoLONE  acetate (PRED FORTE ) 1 % ophthalmic suspension, Place 1 drop into the right eye 3 times daily.   Probiotic Product (PROBIOTIC PO), Take 1 capsule by mouth  daily.   Saw Palmetto, Serenoa repens, (SAW PALMETTO PO), Take 2 capsules by mouth in the morning and at bedtime. * These medications belong to multiple therapeutic classes and are listed under each applicable group.   Objective  Blood pressure 138/80, pulse 72, height 6' 2 (1.88 m), weight 218 lb (98.9 kg), SpO2 98%.   General: No apparent distress alert and oriented x3 mood and affect normal, dressed appropriately.  HEENT: Pupils equal, extraocular movements intact  Respiratory: Patient's speak in full sentences and does not appear short of breath  Cardiovascular: No lower extremity edema, non tender, no erythema  Left knee seems to still have the arthritic changes.  Actually has more swelling of the this moment  Limited muscular skeletal ultrasound was performed and interpreted by CLAUDENE HUSSAR, M  Left knee does have significant decrease in the hypoechoic changes noted.  Does have narrowing of the patellofemoral joint bilaterally but worsening on the right side. Impression: Worsening effusion of the right knee compared to left    Impression and Recommendations:     The above documentation has been reviewed and is accurate and complete Orpheus Hayhurst M Glenden Rossell, DO

## 2023-12-09 ENCOUNTER — Other Ambulatory Visit (HOSPITAL_COMMUNITY): Payer: Self-pay

## 2023-12-09 ENCOUNTER — Other Ambulatory Visit: Payer: Self-pay

## 2023-12-10 ENCOUNTER — Other Ambulatory Visit: Payer: Self-pay

## 2023-12-10 ENCOUNTER — Ambulatory Visit (INDEPENDENT_AMBULATORY_CARE_PROVIDER_SITE_OTHER): Payer: Medicare Other | Admitting: Family Medicine

## 2023-12-10 ENCOUNTER — Encounter: Payer: Self-pay | Admitting: Family Medicine

## 2023-12-10 VITALS — BP 138/80 | HR 72 | Ht 74.0 in | Wt 218.0 lb

## 2023-12-10 DIAGNOSIS — M170 Bilateral primary osteoarthritis of knee: Secondary | ICD-10-CM | POA: Diagnosis not present

## 2023-12-10 NOTE — Assessment & Plan Note (Signed)
 Did extremely well with the viscosupplementation and more with the PRP though of the left knee.  At this point would like to try the PRP on the right knee and we will set patient up next week.

## 2023-12-10 NOTE — Patient Instructions (Addendum)
 Know that we ar always here Let's do the PRP 11:15am on 12/14/2023

## 2023-12-13 NOTE — Progress Notes (Signed)
  [name] [address] [place] [address] Phone: [phone] Subjective:    I'm seeing this patient by the request  of:  Rodrigo Ran, MD  CC: Right knee pain  [id number]  Cory Barnett is a 78 y.o. male coming in with complaint of B knee pain. Here for PRP today. Patient states left knee has been feeling so good is here for the right knee.      Objective  Blood pressure (!) 140/84, pulse 65, height 6\' 2"  (1.88 m), weight 216 lb (98 kg), SpO2 96%.   General: No apparent distress alert and oriented x3 mood and affect normal, dressed appropriately.  HEENT: Pupils equal, extraocular movements intact   After informed written and verbal consent, patient was seated on exam table. Right knee was prepped with alcohol swab and utilizing anterolateral approach, patient's right knee space was injected with 2 cc of 0.5% Marcaine and then injected with 5 cc of leukocyte poor PRP. Patient tolerated the procedure well without immediate complications.    Impression and Recommendations:     The above documentation has been reviewed and is accurate and complete Judi Saa, DO

## 2023-12-14 ENCOUNTER — Encounter: Payer: Self-pay | Admitting: Family Medicine

## 2023-12-14 ENCOUNTER — Ambulatory Visit (INDEPENDENT_AMBULATORY_CARE_PROVIDER_SITE_OTHER): Payer: Self-pay | Admitting: Family Medicine

## 2023-12-14 VITALS — BP 140/84 | HR 65 | Ht 74.0 in | Wt 216.0 lb

## 2023-12-14 DIAGNOSIS — M170 Bilateral primary osteoarthritis of knee: Secondary | ICD-10-CM

## 2023-12-14 NOTE — Assessment & Plan Note (Signed)
Patient given PRP injection today.  Post PRP handout given patient knows we are here if he needs anything actually with him having his ailing wife

## 2023-12-14 NOTE — Patient Instructions (Signed)
No ice or IBU for 3 days Heat and Tylenol are ok See me again in 6 weeks

## 2023-12-21 DIAGNOSIS — H6991 Unspecified Eustachian tube disorder, right ear: Secondary | ICD-10-CM | POA: Diagnosis not present

## 2023-12-29 DIAGNOSIS — C44629 Squamous cell carcinoma of skin of left upper limb, including shoulder: Secondary | ICD-10-CM | POA: Diagnosis not present

## 2023-12-29 DIAGNOSIS — C4492 Squamous cell carcinoma of skin, unspecified: Secondary | ICD-10-CM | POA: Diagnosis not present

## 2024-01-07 DIAGNOSIS — C44629 Squamous cell carcinoma of skin of left upper limb, including shoulder: Secondary | ICD-10-CM | POA: Diagnosis not present

## 2024-01-19 DIAGNOSIS — H903 Sensorineural hearing loss, bilateral: Secondary | ICD-10-CM | POA: Diagnosis not present

## 2024-01-19 DIAGNOSIS — H9313 Tinnitus, bilateral: Secondary | ICD-10-CM | POA: Diagnosis not present

## 2024-01-24 NOTE — Progress Notes (Deleted)
 [name] [address] [place] [address] Phone: [phone] Subjective:    I'm seeing this patient by the request  of:  Rodrigo Ran, MD  CC:   [id number]  12/14/2023 Patient given PRP injection today. Post PRP handout given patient knows we are here if he needs anything actually with him having his ailing wife   Update 01/25/2024 Cory Barnett is a 78 y.o. male coming in with complaint of B knee pain. 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   IFG (impaired fasting glucose) 01/2016   Pre-diabetes    RBBB (right bundle branch block)    Rheumatic fever    age 33 or 75, in hospital for a month, out of school for a yr   Seasonal allergies    Squamous cell carcinoma    Testicular hypofunction    Past Surgical History:  Procedure Laterality Date   EYE SURGERY     right eye  cataract surgery with lens implant   NASAL SEPTUM SURGERY     NOSE SURGERY     REVERSE SHOULDER ARTHROPLASTY Right 01/11/2020   Procedure: REVERSE SHOULDER ARTHROPLASTY;  Surgeon: Jones Broom, MD;  Location: [facility];  Service: Orthopedics;  Laterality: Right;   SHOULDER ARTHROSCOPY WITH SUBACROMIAL DECOMPRESSION Right 08/07/2019   Procedure: SHOULDER ARTHROSCOPY WITH SUBACROMIAL DECOMPRESSION;  Surgeon: Jones Broom, MD;  Location: [facility];  Service: Orthopedics;  Laterality: Right;   SKIN CANCER EXCISION     basal and squamous cell    TONSILLECTOMY     VASECTOMY     WISDOM TOOTH EXTRACTION     Social History   Socioeconomic History   Marital status: Married    Spouse name: Not on file   Number of children: 3   Years of education: Not on file   Highest education level: Not on file  Occupational  History    Comment: Retired  Tobacco Use   Smoking status: Former    Current packs/day: 0.00    Types: Cigarettes    Quit date: 12/14/1969    Years since quitting: 54.1   Smokeless tobacco: Never  Vaping Use   Vaping status: Never Used  Substance and Sexual Activity   Alcohol use: Yes    Alcohol/week: 2.0 standard drinks of alcohol    Types: 2 Cans of beer per week    Comment: social   Drug use: Never   Sexual activity: Not on file  Other Topics Concern   Not on file  Social History Narrative   Not on file   Social Drivers of Health   Financial Resource Strain: Not on file  Food Insecurity: Not on file  Transportation Needs: Not on file  Physical Activity: Not on file  Stress: Not on file  Social Connections: Unknown (03/17/2022)   Received from [facility], [facility]   Social Network    Social Network: Not on file   Allergies  Allergen Reactions   Bee Venom Anaphylaxis   Moxifloxacin Shortness Of Breath   Family History  Problem Relation Age of Onset   Stroke Mother    Arthritis Mother  Cancer Mother        unknown    Dementia Mother    Arthritis Father    Mesothelioma Father    Arthritis Sister    Arthritis Sister    Bipolar disorder Sister      Current Outpatient Medications (Cardiovascular):    ezetimibe (ZETIA) 10 MG tablet, Take 1 tablet (10 mg total) by mouth daily.   losartan (COZAAR) 50 MG tablet, Take 1 tablet (50 mg total) by mouth at bedtime.   rosuvastatin (CRESTOR) 10 MG tablet, TAKE 1 TABLET BY MOUTH EVERY DAY   rosuvastatin (CRESTOR) 20 MG tablet, Take 1 tablet (20 mg total) by mouth daily.   sildenafil (REVATIO) 20 MG tablet, Take 1-5 tablets (20-100 mg total) by mouth 30 minutes-4 hours prior to sexual activity as needed.   tadalafil (CIALIS) 5 MG tablet, Take 1 tablet (5 mg total) by mouth daily.   tadalafil (CIALIS) 5 MG tablet, Take 1 tablet (5 mg total) by mouth daily.  Current Outpatient Medications (Respiratory):     umeclidinium bromide (INCRUSE ELLIPTA) 62.5 MCG/ACT AEPB, Inhale 1 puff into the lungs daily (rinse mouth with water after use)  Current Outpatient Medications (Analgesics):    aspirin EC 81 MG tablet, Take 1 tablet (81 mg total) by mouth daily. Swallow whole.   Current Outpatient Medications (Other):    amoxicillin (AMOXIL) 500 MG capsule, Take 4 capsules by mouth 1 hour prior to dental treatment   amoxicillin-clavulanate (AUGMENTIN) 875-125 MG tablet, Take 1 tablet by mouth 2 (two) times daily  with food for 10 days   Barberry-Oreg Grape-Goldenseal (BERBERINE COMPLEX PO), Take by mouth.   cefdinir (OMNICEF) 300 MG capsule, Take 1 capsule (300 mg total) by mouth 2 (two) times daily with food.   CHELATED MAGNESIUM PO, Take 2 tablets by mouth in the morning and at bedtime.    Cholecalciferol (VITAMIN D3) 1.25 MG (50000 UT) CAPS, Take by mouth.   ciprofloxacin-dexamethasone (CIPRODEX) OTIC suspension, Place 4 drops into the right ear 2 (two) times daily for 3 days   Coenzyme Q10 (CO Q 10) 100 MG CAPS, Take 100 mg by mouth in the morning and at bedtime.    escitalopram (LEXAPRO) 10 MG tablet, Take 1 tablet (10 mg total) by mouth daily.   Investigational omega-3-fatty acid/placebo capsule S0927*, Take 4 capsules by mouth 2 (two) times daily. Take with food.   Multiple Vitamin (MULTI-VITAMINS) TABS, Take 3 tablets by mouth in the morning and at bedtime.    nirmatrelvir/ritonavir EUA (PAXLOVID) TABS, Take 3 tablets by mouth 2 (two) times daily.   ofloxacin (OCUFLOX) 0.3 % ophthalmic solution, Apply 4 drops in right ear twice daily for 5 days.   prednisoLONE acetate (PRED FORTE) 1 % ophthalmic suspension, Place 1 drop into the right eye 3 (three) times daily.   prednisoLONE acetate (PRED FORTE) 1 % ophthalmic suspension, Place 1 drop into the right eye 3 times daily.   Probiotic Product (PROBIOTIC PO), Take 1 capsule by mouth daily.   Saw Palmetto, Serenoa repens, (SAW PALMETTO PO), Take 2 capsules  by mouth in the morning and at bedtime. * These medications belong to multiple therapeutic classes and are listed under each applicable group.   Reviewed prior external information including notes and imaging from  primary care provider As well as notes that were available from care everywhere and other healthcare systems.  Past medical history, social, surgical and family history all reviewed in electronic medical record.  No pertanent information unless stated regarding  to the chief complaint.   Review of Systems:  No headache, visual changes, nausea, vomiting, diarrhea, constipation, dizziness, abdominal pain, skin rash, fevers, chills, night sweats, weight loss, swollen lymph nodes, body aches, joint swelling, chest pain, shortness of breath, mood changes. POSITIVE muscle aches  Objective  There were no vitals taken for this visit.   General: No apparent distress alert and oriented x3 mood and affect normal, dressed appropriately.  HEENT: Pupils equal, extraocular movements intact  Respiratory: Patient's speak in full sentences and does not appear short of breath  Cardiovascular: No lower extremity edema, non tender, no erythema      Impression and Recommendations:

## 2024-01-25 ENCOUNTER — Ambulatory Visit: Payer: Medicare Other | Admitting: Family Medicine

## 2024-02-19 ENCOUNTER — Other Ambulatory Visit (HOSPITAL_COMMUNITY): Payer: Self-pay

## 2024-03-06 ENCOUNTER — Other Ambulatory Visit (HOSPITAL_COMMUNITY): Payer: Self-pay

## 2024-03-06 ENCOUNTER — Other Ambulatory Visit: Payer: Self-pay | Admitting: Cardiology

## 2024-03-06 MED ORDER — ROSUVASTATIN CALCIUM 20 MG PO TABS
20.0000 mg | ORAL_TABLET | Freq: Every day | ORAL | 3 refills | Status: AC
  Filled 2024-03-06: qty 90, 90d supply, fill #0
  Filled 2024-06-02 – 2024-06-05 (×2): qty 90, 90d supply, fill #1
  Filled 2024-07-07 – 2024-08-30 (×2): qty 90, 90d supply, fill #2
  Filled 2024-11-30 – 2024-12-02 (×2): qty 90, 90d supply, fill #3

## 2024-03-07 ENCOUNTER — Other Ambulatory Visit (HOSPITAL_COMMUNITY): Payer: Self-pay

## 2024-03-07 ENCOUNTER — Other Ambulatory Visit: Payer: Self-pay

## 2024-03-07 MED ORDER — ASPIRIN 81 MG PO TBEC: 81.0000 mg | DELAYED_RELEASE_TABLET | Freq: Every day | ORAL | 0 refills | Status: AC

## 2024-03-23 ENCOUNTER — Other Ambulatory Visit (HOSPITAL_COMMUNITY): Payer: Self-pay

## 2024-03-24 ENCOUNTER — Other Ambulatory Visit (HOSPITAL_BASED_OUTPATIENT_CLINIC_OR_DEPARTMENT_OTHER): Payer: Self-pay

## 2024-03-24 ENCOUNTER — Other Ambulatory Visit (HOSPITAL_COMMUNITY): Payer: Self-pay

## 2024-03-24 MED ORDER — SILDENAFIL CITRATE 20 MG PO TABS
ORAL_TABLET | ORAL | 11 refills | Status: AC
  Filled 2024-03-24: qty 50, 10d supply, fill #0
  Filled 2024-03-29 – 2024-06-05 (×3): qty 50, 10d supply, fill #1
  Filled 2024-07-07: qty 50, 10d supply, fill #2
  Filled 2024-07-14: qty 50, 10d supply, fill #3
  Filled 2024-08-30: qty 50, 10d supply, fill #4
  Filled 2024-11-16: qty 50, 10d supply, fill #5
  Filled 2024-11-30 – 2024-12-02 (×2): qty 50, 10d supply, fill #6

## 2024-03-29 ENCOUNTER — Other Ambulatory Visit: Payer: Self-pay

## 2024-04-01 ENCOUNTER — Other Ambulatory Visit (HOSPITAL_COMMUNITY): Payer: Self-pay

## 2024-04-22 ENCOUNTER — Other Ambulatory Visit (HOSPITAL_COMMUNITY): Payer: Self-pay

## 2024-04-24 DIAGNOSIS — L820 Inflamed seborrheic keratosis: Secondary | ICD-10-CM | POA: Diagnosis not present

## 2024-05-23 ENCOUNTER — Other Ambulatory Visit: Payer: Self-pay

## 2024-05-23 DIAGNOSIS — M170 Bilateral primary osteoarthritis of knee: Secondary | ICD-10-CM

## 2024-05-23 DIAGNOSIS — M5416 Radiculopathy, lumbar region: Secondary | ICD-10-CM

## 2024-05-31 DIAGNOSIS — D485 Neoplasm of uncertain behavior of skin: Secondary | ICD-10-CM | POA: Diagnosis not present

## 2024-05-31 DIAGNOSIS — D225 Melanocytic nevi of trunk: Secondary | ICD-10-CM | POA: Diagnosis not present

## 2024-05-31 DIAGNOSIS — L570 Actinic keratosis: Secondary | ICD-10-CM | POA: Diagnosis not present

## 2024-05-31 DIAGNOSIS — L739 Follicular disorder, unspecified: Secondary | ICD-10-CM | POA: Diagnosis not present

## 2024-05-31 DIAGNOSIS — L578 Other skin changes due to chronic exposure to nonionizing radiation: Secondary | ICD-10-CM | POA: Diagnosis not present

## 2024-05-31 DIAGNOSIS — Z85828 Personal history of other malignant neoplasm of skin: Secondary | ICD-10-CM | POA: Diagnosis not present

## 2024-05-31 DIAGNOSIS — C44319 Basal cell carcinoma of skin of other parts of face: Secondary | ICD-10-CM | POA: Diagnosis not present

## 2024-05-31 DIAGNOSIS — L821 Other seborrheic keratosis: Secondary | ICD-10-CM | POA: Diagnosis not present

## 2024-06-02 ENCOUNTER — Other Ambulatory Visit (HOSPITAL_COMMUNITY): Payer: Self-pay

## 2024-06-02 ENCOUNTER — Other Ambulatory Visit (HOSPITAL_BASED_OUTPATIENT_CLINIC_OR_DEPARTMENT_OTHER): Payer: Self-pay

## 2024-06-02 MED ORDER — ESCITALOPRAM OXALATE 10 MG PO TABS
10.0000 mg | ORAL_TABLET | Freq: Every day | ORAL | 3 refills | Status: AC
  Filled 2024-06-02 – 2024-07-29 (×2): qty 90, 90d supply, fill #0
  Filled 2024-11-16: qty 90, 90d supply, fill #1

## 2024-06-02 MED ORDER — EZETIMIBE 10 MG PO TABS
10.0000 mg | ORAL_TABLET | Freq: Every day | ORAL | 3 refills | Status: AC
  Filled 2024-06-02 – 2024-07-29 (×2): qty 90, 90d supply, fill #0
  Filled 2024-11-16: qty 90, 90d supply, fill #1

## 2024-06-05 ENCOUNTER — Other Ambulatory Visit (HOSPITAL_COMMUNITY): Payer: Self-pay

## 2024-06-05 MED ORDER — TADALAFIL 5 MG PO TABS
5.0000 mg | ORAL_TABLET | Freq: Every day | ORAL | 3 refills | Status: AC
  Filled 2024-06-05: qty 90, 90d supply, fill #0
  Filled 2024-07-07 – 2024-11-16 (×2): qty 90, 90d supply, fill #1

## 2024-06-06 ENCOUNTER — Other Ambulatory Visit (HOSPITAL_COMMUNITY): Payer: Self-pay

## 2024-06-06 MED ORDER — CEPHALEXIN 500 MG PO CAPS
2000.0000 mg | ORAL_CAPSULE | ORAL | 0 refills | Status: AC
  Filled 2024-06-06: qty 4, 1d supply, fill #0

## 2024-06-12 ENCOUNTER — Other Ambulatory Visit (HOSPITAL_COMMUNITY): Payer: Self-pay

## 2024-06-12 DIAGNOSIS — I10 Essential (primary) hypertension: Secondary | ICD-10-CM | POA: Diagnosis not present

## 2024-06-12 DIAGNOSIS — E785 Hyperlipidemia, unspecified: Secondary | ICD-10-CM | POA: Diagnosis not present

## 2024-06-12 DIAGNOSIS — I440 Atrioventricular block, first degree: Secondary | ICD-10-CM | POA: Diagnosis not present

## 2024-06-12 DIAGNOSIS — R600 Localized edema: Secondary | ICD-10-CM | POA: Diagnosis not present

## 2024-06-12 DIAGNOSIS — R002 Palpitations: Secondary | ICD-10-CM | POA: Diagnosis not present

## 2024-06-12 DIAGNOSIS — R6882 Decreased libido: Secondary | ICD-10-CM | POA: Diagnosis not present

## 2024-06-12 MED ORDER — HYDROCHLOROTHIAZIDE 25 MG PO TABS
25.0000 mg | ORAL_TABLET | Freq: Every morning | ORAL | 3 refills | Status: DC
  Filled 2024-06-12: qty 30, 30d supply, fill #0
  Filled 2024-07-07: qty 30, 30d supply, fill #1
  Filled 2024-07-12 – 2024-07-29 (×4): qty 30, 30d supply, fill #2
  Filled 2024-09-01: qty 30, 30d supply, fill #3

## 2024-06-17 NOTE — Therapy (Signed)
 OUTPATIENT PHYSICAL THERAPY LOWER EXTREMITY EVALUATION   Patient Name: Cory Barnett MRN: [id number] DOB:1946-09-27, 78 y.o., male Today's Date: 06/19/2024  END OF SESSION:  PT End of Session - 06/19/24 0935     Visit Number 1    Date for PT Re-Evaluation 08/14/24    Authorization Type Medicare B    Progress Note Due on Visit 10    PT Start Time [phone]    PT Stop Time 0933    PT Time Calculation (min) 47 min    Activity Tolerance Patient tolerated treatment well    Behavior During Therapy [facility] for tasks assessed/performed           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   IFG (impaired fasting glucose) 01/2016   Pre-diabetes    RBBB (right bundle branch block)    Rheumatic fever    age 34 or 21, in hospital for a month, out of school for a yr   Seasonal allergies    Squamous cell carcinoma    Testicular hypofunction    Past Surgical History:  Procedure Laterality Date   EYE SURGERY     right eye  cataract surgery with lens implant   NASAL SEPTUM SURGERY     NOSE SURGERY     REVERSE SHOULDER ARTHROPLASTY Right 01/11/2020   Procedure: REVERSE SHOULDER ARTHROPLASTY;  Surgeon: Dozier Soulier, MD;  Location: [facility];  Service: Orthopedics;  Laterality: Right;   SHOULDER ARTHROSCOPY WITH SUBACROMIAL DECOMPRESSION Right 08/07/2019   Procedure: SHOULDER ARTHROSCOPY WITH SUBACROMIAL DECOMPRESSION;  Surgeon: Dozier Soulier, MD;  Location: [facility];  Service: Orthopedics;  Laterality: Right;   SKIN CANCER EXCISION     basal and squamous cell    TONSILLECTOMY     VASECTOMY     WISDOM TOOTH EXTRACTION     Patient Active Problem List   Diagnosis Date Noted   Bursitis of both hips 02/23/2023   Degenerative arthritis of knee, bilateral 11/08/2022    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   Left knee pain 08/05/2020   Piriformis syndrome of left side 08/18/2019   Rotator cuff tear, right 05/29/2019   Closed fracture of distal clavicle 05/15/2019   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    PCP: Shayne Anes, MD   REFERRING PROVIDER: Claudene Hussar, MD  REFERRING DIAG:  Diagnosis  M17.0 (ICD-10-CM) - Primary osteoarthritis of both knees  M54.16 (ICD-10-CM) - Lumbar radiculitis    THERAPY DIAG:  Chronic pain of left knee - Plan: PT plan of care cert/re-cert  Muscle weakness (generalized) - Plan: PT plan of care cert/re-cert  Other abnormalities of gait and mobility - Plan: PT plan of care cert/re-cert  Chronic pain of right knee - Plan: PT plan of care cert/re-cert  Other low back pain - Plan: PT plan of care cert/re-cert  Cramp and spasm - Plan: PT plan of care cert/re-cert  Rationale for Evaluation and Treatment: Rehabilitation  ONSET DATE: chronic with recent falls   SUBJECTIVE:   SUBJECTIVE STATEMENT: Pt presents to PT with complaints of chronic bil knee pain secondary to OA.  He has been treated at this clinic in the past to address strength, flexibility and to reduce falls.  He is  a candidate for TKA and would like to get stronger before considering this.  Pt had onset of LBP x 1 month after a car ride from the beach.  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   PERTINENT HISTORY: Anxiety, HTN, reverse shoulder replacement (2021), basal cell carcinoma PAIN:  Are you having pain? Yes: NPRS scale: knee 0-10/10, back 0-6/10 with standing (limited 20 minutes)  Pain location: Lt>Rt knee , low back  Pain description: knees (sharp), low back (dull ache) Aggravating factors: going up steps, standing long periods, squatting to feed cat  Relieving factors: sitting down, not climbing steps   PRECAUTIONS: Fall  WEIGHT BEARING RESTRICTIONS: No  FALLS:  Has patient fallen in last 6 months? No  LIVING  ENVIRONMENT: Lives with: lives alone, recent widower Lives in: House/apartment Stairs: Yes: External: 3 steps; on right going up Has following equipment at home: None  OCCUPATION: retired  PLOF: Independent, Vocation/Vocational requirements: desk work, walking-retired but small amount of work, and Leisure: walking for exercises Pilates every week in a small group, works out with trainer 2x/wk. 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 No acute osseous abnormalities  Right knee:  Degenerative changes. No acute osseous abnormalities.   US  on 12/10/23:  Limited muscular skeletal ultrasound was performed and interpreted by CLAUDENE HUSSAR, M  Left knee does have significant decrease in the hypoechoic changes noted.  Does have narrowing of the patellofemoral joint bilaterally but worsening on the right side. Impression: Worsening effusion of the right knee compared to left   COGNITION: Overall cognitive status: Within functional limits for tasks assessed     SENSATION: WFL  MUSCLE LENGTH: Hamstring flexibility limited by 25% bilaterally  POSTURE: rounded shoulders and forward head  PALPATION: Diffuse palpable tenderness in bil quads, and with patellar mobs/compression   LOWER EXTREMITY ROM:  Knee extension limited   LOWER EXTREMITY MMT:  MMT Right eval Left eval  Hip flexion 4 4  Hip extension 4+ 4+  Hip abduction 4 4  Hip adduction    Hip internal rotation    Hip external rotation    Knee flexion 4+ 4+  Knee extension 4+ 4+  Ankle dorsiflexion 5 5  Ankle plantarflexion    Ankle inversion    Ankle eversion     (Blank rows = not tested)   FUNCTIONAL TESTS:  06/19/24: 5x sit to stand: 20.44 seconds   Modified Oswestry: 14/50=28%  GAIT: Distance walked: 100 Assistive device utilized: None Level of assistance:  Complete Independence Comments: bil knee flexion, wide base of support Steps: negotiating with alternating pattern and use of hands for support, pain with uncontrolled descent with descending    TODAY'S TREATMENT:  DATE: 06/19/24 Findings from evaluation discussed, pt educated on plan of care, HEP initiated.    PATIENT EDUCATION:  Education details: Access Code: [email or web address], use walking stick for community distances and football stadiums Person educated: Patient Education method: Explanation, Facilities manager, and Handouts Education comprehension: verbalized understanding and returned demonstration  HOME EXERCISE PROGRAM: Access Code: [email or web address] URL: [email or web address] Date: 06/19/2024 Prepared by: Burnard  Exercises - Sit to Stand Without Arm Support  - 2 x daily - 7 x weekly - 2 sets - 5 reps - Seated Hamstring Stretch  - 3 x daily - 7 x weekly - 1 sets - 3 reps - 20 hold - Supine Lower Trunk Rotation  - 3 x daily - 7 x weekly - 1 sets - 3 reps - 20 hold - Hooklying Single Knee to Chest Stretch  - 3 x daily - 7 x weekly - 1 sets - 3 reps - 20 hold - Seated Figure 4 Piriformis Stretch  - 3 x daily - 7 x weekly - 1 sets - 3 reps - 20 hold - Heel Raises with Counter Support  - 1 x daily - 7 x weekly - 3 sets - 10 reps  ASSESSMENT:  CLINICAL IMPRESSION: Patient is a 78 y.o. male who was seen today for physical therapy evaluation and treatment for chronic bil knee pain, recent onset of LBP, and balance deficits. 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    He has history of bil knee OA and is a candidate for TKA.  He would like work to get stronger before considering this.  He reports up to 10/10 Bil knee pain with ascending steps and squatting to feed his cat.  Pt reports 7/10 LBP with  standing and is limited to standing 20 min.Pt requires UE support for good control with sit to stand transition.   Pt with increased sit to stand time, indicating that he is at falls risk.  Crepitus in bil knees with flexion/extension.  Uncontrolled descent with descending steps and pain.  Use of bil hand rails for stability.  Patient will benefit from skilled PT to address the below impairments and improve overall function.   OBJECTIVE IMPAIRMENTS: Abnormal gait, decreased activity tolerance, decreased balance, decreased endurance, difficulty walking, decreased strength, decreased safety awareness, increased muscle spasms, impaired flexibility, improper body mechanics, and pain.   ACTIVITY LIMITATIONS: carrying, lifting, standing, squatting, stairs, transfers, and locomotion level  PARTICIPATION LIMITATIONS: cleaning, laundry, community activity, and yard work  PERSONAL FACTORS: Age, Time since onset of injury/illness/exacerbation, and 1 comorbidity: OA are also affecting patient's functional outcome.   REHAB POTENTIAL: Good  CLINICAL DECISION MAKING: Evolving/moderate complexity  EVALUATION COMPLEXITY: Moderate   GOALS: Goals reviewed with patient? Yes  SHORT TERM GOALS: Target date: 07/17/2024     Be independent in initial HEP Baseline: Goal status: INITIAL  2.  Perform 5x sit to stand in < 16 seconds without UE  support to reduce falls risk Baseline: 20.44 Goal status: INITIAL  3.  Improve bil knee strength and stability to ascend 8 with use of 1 rail for safety in negotiating steps at high school football games Baseline:  Goal status: INITIAL  4.  Stand > or = to 30 min without limitation due to LBP.   Baseline: 20 min max  Goal Status: INITIAL  LONG TERM GOALS: Target date: 08/14/2024    Be independent in advanced HEP Baseline:  Goal  status: INITIAL  2.  Perform 5x sit to stand in < or = to 13 seconds to reduce falls risk  Baseline: 20.44 seconds  Goal status:  INITIAL  3.  Improve Modified Oswestry to < or = to 18% disability  Baseline: 28% disability  Goal status: INITIAL  4.  Stand for > or = to 45 min without limitation due to LBP Baseline: 20 min max  Goal status: INITIAL  5.  Improve LE strength to squat to feed his cat and return to standing with 50% increased ease  Baseline: pain and difficulty getting up Goal status: INITIAL PLAN:  PT FREQUENCY: 2x/week  PT DURATION: 8 weeks  PLANNED INTERVENTIONS: Therapeutic exercises, Therapeutic activity, Neuromuscular re-education, Balance training, Gait training, Patient/Family education, Self Care, Joint mobilization, Stair training, Vestibular training, Aquatic Therapy, Dry Needling, Cryotherapy, Moist heat, Manual therapy, and Re-evaluation  PLAN FOR NEXT SESSION: Quad and glute strength, balance tasks, hip and knee flexibility, add core strength, functional mobility, work on steps for football stadium.    Burnard Joy, PT 06/19/24 9:50 AM   [facility] [address] [place], [place] [address] Phone # [phone] Fax [phone]

## 2024-06-19 ENCOUNTER — Ambulatory Visit: Attending: Family Medicine

## 2024-06-19 DIAGNOSIS — M25561 Pain in right knee: Secondary | ICD-10-CM | POA: Diagnosis not present

## 2024-06-19 DIAGNOSIS — M170 Bilateral primary osteoarthritis of knee: Secondary | ICD-10-CM | POA: Insufficient documentation

## 2024-06-19 DIAGNOSIS — M5459 Other low back pain: Secondary | ICD-10-CM

## 2024-06-19 DIAGNOSIS — G8929 Other chronic pain: Secondary | ICD-10-CM | POA: Insufficient documentation

## 2024-06-19 DIAGNOSIS — M5416 Radiculopathy, lumbar region: Secondary | ICD-10-CM | POA: Insufficient documentation

## 2024-06-19 DIAGNOSIS — M25562 Pain in left knee: Secondary | ICD-10-CM | POA: Diagnosis not present

## 2024-06-19 DIAGNOSIS — R269 Unspecified abnormalities of gait and mobility: Secondary | ICD-10-CM | POA: Insufficient documentation

## 2024-06-19 DIAGNOSIS — M6281 Muscle weakness (generalized): Secondary | ICD-10-CM | POA: Insufficient documentation

## 2024-06-19 DIAGNOSIS — R2689 Other abnormalities of gait and mobility: Secondary | ICD-10-CM

## 2024-06-19 DIAGNOSIS — R252 Cramp and spasm: Secondary | ICD-10-CM

## 2024-06-20 ENCOUNTER — Ambulatory Visit

## 2024-06-20 DIAGNOSIS — C44319 Basal cell carcinoma of skin of other parts of face: Secondary | ICD-10-CM | POA: Diagnosis not present

## 2024-06-22 ENCOUNTER — Ambulatory Visit

## 2024-06-22 DIAGNOSIS — R2689 Other abnormalities of gait and mobility: Secondary | ICD-10-CM

## 2024-06-22 DIAGNOSIS — M25562 Pain in left knee: Secondary | ICD-10-CM | POA: Diagnosis not present

## 2024-06-22 DIAGNOSIS — M6281 Muscle weakness (generalized): Secondary | ICD-10-CM

## 2024-06-22 DIAGNOSIS — G8929 Other chronic pain: Secondary | ICD-10-CM

## 2024-06-22 DIAGNOSIS — M5416 Radiculopathy, lumbar region: Secondary | ICD-10-CM | POA: Diagnosis not present

## 2024-06-22 DIAGNOSIS — R269 Unspecified abnormalities of gait and mobility: Secondary | ICD-10-CM | POA: Diagnosis not present

## 2024-06-22 DIAGNOSIS — M170 Bilateral primary osteoarthritis of knee: Secondary | ICD-10-CM | POA: Diagnosis not present

## 2024-06-22 NOTE — Therapy (Signed)
 OUTPATIENT PHYSICAL THERAPY LOWER EXTREMITY EVALUATION   Patient Name: Cory Barnett MRN: [id number] DOB:09/12/1946, 78 y.o., male Today's Date: 06/22/2024  END OF SESSION:  PT End of Session - 06/22/24 1147     Visit Number 2    Date for PT Re-Evaluation 08/14/24    Authorization Type Medicare B    Progress Note Due on Visit 10    PT Start Time 1102    PT Stop Time 1147    PT Time Calculation (min) 45 min    Activity Tolerance Patient tolerated treatment well    Behavior During Therapy WFL for tasks assessed/performed            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   IFG (impaired fasting glucose) 01/2016   Pre-diabetes    RBBB (right bundle branch block)    Rheumatic fever    age 16 or 83, in hospital for a month, out of school for a yr   Seasonal allergies    Squamous cell carcinoma    Testicular hypofunction    Past Surgical History:  Procedure Laterality Date   EYE SURGERY     right eye  cataract surgery with lens implant   NASAL SEPTUM SURGERY     NOSE SURGERY     REVERSE SHOULDER ARTHROPLASTY Right 01/11/2020   Procedure: REVERSE SHOULDER ARTHROPLASTY;  Surgeon: Dozier Soulier, MD;  Location: [facility];  Service: Orthopedics;  Laterality: Right;   SHOULDER ARTHROSCOPY WITH SUBACROMIAL DECOMPRESSION Right 08/07/2019   Procedure: SHOULDER ARTHROSCOPY WITH SUBACROMIAL DECOMPRESSION;  Surgeon: Dozier Soulier, MD;  Location: [facility];  Service: Orthopedics;  Laterality: Right;   SKIN CANCER EXCISION     basal and squamous cell    TONSILLECTOMY     VASECTOMY     WISDOM TOOTH EXTRACTION     Patient Active Problem List   Diagnosis Date Noted   Bursitis of both hips 02/23/2023   Degenerative arthritis of knee, bilateral 11/08/2022    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   Left knee pain 08/05/2020   Piriformis syndrome of left side 08/18/2019   Rotator cuff tear, right 05/29/2019   Closed fracture of distal clavicle 05/15/2019   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  I took a walk today and didn't take my walking stick.    PERTINENT HISTORY: Anxiety, HTN, reverse shoulder replacement (2021), basal cell carcinoma PAIN:  Are you having pain? Yes: NPRS scale: knee 0-10/10, back 0-6/10 with standing (limited 20 minutes)  Pain location: Lt>Rt knee , low back  Pain description: knees (sharp), low back (dull ache) Aggravating factors: going up steps, standing long periods, squatting to feed cat  Relieving factors: sitting  down, not climbing steps   PRECAUTIONS: Fall  WEIGHT BEARING RESTRICTIONS: No  FALLS:  Has patient fallen in last 6 months? No  LIVING ENVIRONMENT: Lives with: lives alone, recent widower Lives in: House/apartment Stairs: Yes: External: 3 steps; on right going up Has following equipment at home: None  OCCUPATION: retired  PLOF: Independent, Vocation/Vocational requirements: desk work, walking-retired but small amount of work, and Leisure: walking for exercises Pilates every week in a small group, works out with trainer 2x/wk. 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 No acute osseous abnormalities  Right knee:  Degenerative changes. No acute osseous abnormalities.   US  on 12/10/23:  Limited muscular skeletal ultrasound was performed and interpreted by CLAUDENE HUSSAR, M  Left knee does have significant decrease in the hypoechoic changes noted.  Does have narrowing of the patellofemoral joint bilaterally but worsening on the right side. Impression: Worsening effusion of the right knee compared to left   COGNITION: Overall cognitive status: Within functional limits for tasks assessed     SENSATION: WFL  MUSCLE LENGTH: Hamstring flexibility limited by 25% bilaterally  POSTURE: rounded shoulders and forward head  PALPATION: Diffuse palpable tenderness in bil quads, and with patellar mobs/compression   LOWER EXTREMITY ROM:  Knee extension limited   LOWER EXTREMITY MMT:  MMT Right eval Left eval  Hip flexion 4 4  Hip extension 4+ 4+  Hip abduction 4 4  Hip adduction    Hip internal rotation    Hip external rotation    Knee flexion 4+ 4+  Knee extension 4+ 4+  Ankle dorsiflexion 5 5  Ankle plantarflexion    Ankle inversion    Ankle eversion     (Blank rows = not tested)   FUNCTIONAL TESTS:  06/19/24: 5x sit to stand: 20.44 seconds   Modified Oswestry: 14/50=28%  GAIT: Distance walked: 100 Assistive device utilized: None Level of assistance: Complete Independence Comments: bil knee flexion, wide base of support Steps: negotiating with alternating pattern and use of hands for support, pain with uncontrolled descent with descending    TODAY'S TREATMENT:                                                                                                                              DATE:  06/22/24 NuStep: Level 2x 8 min- PT present to discuss progress Seated hamstring stretch 3x20 seconds  Gait with walking stick: around building and up 8 step to simulate stadium step Heel raises  2x10 Sit to stand: with pad under 2x10 Sidelying clam 2x10 Trigger Point Dry Needling  Initial Treatment: Pt instructed on Dry Needling rational, procedures, and possible side effects. Pt instructed to expect mild to moderate muscle soreness later in the day and/or into the next day.  Pt instructed in methods to reduce muscle  soreness. Pt instructed to continue prescribed HEP. Because Dry Needling was performed over or adjacent to a lung field, pt was educated on S/S of pneumothorax and to seek immediate medical attention should they occur.  Patient was educated on signs and symptoms of infection and other risk factors and advised to seek medical attention should they occur.  Patient verbalized understanding of these instructions and education.   Patient Verbal Consent Given: Yes Education Handout Provided: Yes Muscles Treated: Bil lumbar multifidi  Electrical Stimulation Performed: No Treatment Response/Outcome: Utilized skilled palpation to identify bony landmarks and trigger points.  Able to illicit twitch response and muscle elongation.  Soft tissue mobilization to muscles needled following DN to further promote tissue elongation and decreased pain.      06/19/24 Findings from evaluation discussed, pt educated on plan of care, HEP initiated.    PATIENT EDUCATION:  Education details: Access Code: [email or web address], use walking stick for community distances and football stadiums Person educated: Patient Education method: Explanation, Facilities manager, and Handouts Education comprehension: verbalized understanding and returned demonstration  HOME EXERCISE PROGRAM: Access Code: [email or web address] URL: [email or web address] Date: 06/19/2024 Prepared by: Burnard  Exercises - Sit to Stand Without Arm Support  - 2 x daily - 7 x weekly - 2 sets - 5 reps - Seated Hamstring Stretch  - 3 x daily - 7 x weekly - 1 sets - 3 reps - 20 hold - Supine Lower Trunk Rotation  - 3 x daily - 7 x weekly - 1  sets - 3 reps - 20 hold - Hooklying Single Knee to Chest Stretch  - 3 x daily - 7 x weekly - 1 sets - 3 reps - 20 hold - Seated Figure 4 Piriformis Stretch  - 3 x daily - 7 x weekly - 1 sets - 3 reps - 20 hold - Heel Raises with Counter Support  - 1 x daily - 7 x weekly - 3 sets - 10 reps  ASSESSMENT:  CLINICAL IMPRESSION: First time follow-up after evaluation.  He reports pain with sit to stand exercise and PT advised that he elevate his seat height and do fewer reps.  Pt worked with walking stick and 8 step to simulate stadium steps.  Pt agreed to use walking stick in the community for assistance.  Pt with good response to dry needling with twitch on Rt>Lt.   Patient will benefit from skilled PT to address the below impairments and improve overall function.   OBJECTIVE IMPAIRMENTS: Abnormal gait, decreased activity tolerance, decreased balance, decreased endurance, difficulty walking, decreased strength, decreased safety awareness, increased muscle spasms, impaired flexibility, improper body mechanics, and pain.   ACTIVITY LIMITATIONS: carrying, lifting, standing, squatting, stairs, transfers, and locomotion level  PARTICIPATION LIMITATIONS: cleaning, laundry, community activity, and yard work  PERSONAL FACTORS: Age, Time since onset of injury/illness/exacerbation, and 1 comorbidity: OA are also affecting patient's functional outcome.   REHAB POTENTIAL: Good  CLINICAL DECISION MAKING: Evolving/moderate complexity  EVALUATION COMPLEXITY: Moderate   GOALS: Goals reviewed with patient? Yes  SHORT TERM GOALS: Target date: 07/17/2024     Be independent in initial HEP Baseline: Goal status: INITIAL  2.  Perform 5x sit to stand in < 16 seconds without UE  support to reduce falls risk Baseline: 20.44 Goal status: INITIAL  3.  Improve bil knee strength and stability to ascend 8 with use of 1 rail for safety in negotiating steps at high school football games Baseline:  Goal status:  INITIAL  4.  Stand > or =  to 30 min without limitation due to LBP.   Baseline: 20 min max  Goal Status: INITIAL  LONG TERM GOALS: Target date: 08/14/2024    Be independent in advanced HEP Baseline:  Goal status: INITIAL  2.  Perform 5x sit to stand in < or = to 13 seconds to reduce falls risk  Baseline: 20.44 seconds  Goal status: INITIAL  3.  Improve Modified Oswestry to < or = to 18% disability  Baseline: 28% disability  Goal status: INITIAL  4.  Stand for > or = to 45 min without limitation due to LBP Baseline: 20 min max  Goal status: INITIAL  5.  Improve LE strength to squat to feed his cat and return to standing with 50% increased ease  Baseline: pain and difficulty getting up Goal status: INITIAL PLAN:  PT FREQUENCY: 2x/week  PT DURATION: 8 weeks  PLANNED INTERVENTIONS: Therapeutic exercises, Therapeutic activity, Neuromuscular re-education, Balance training, Gait training, Patient/Family education, Self Care, Joint mobilization, Stair training, Vestibular training, Aquatic Therapy, Dry Needling, Cryotherapy, Moist heat, Manual therapy, and Re-evaluation  PLAN FOR NEXT SESSION: Quad and glute strength, balance tasks, hip and knee flexibility, add core strength, functional mobility, work on steps for football stadium. See how he did with dry needling and repeat if helpful.     Burnard Joy, PT 06/22/24 11:54 AM   [facility] [address], [address] [place], [place] [address] Phone # [phone] Fax [phone]

## 2024-06-27 ENCOUNTER — Ambulatory Visit

## 2024-06-27 DIAGNOSIS — M25562 Pain in left knee: Secondary | ICD-10-CM | POA: Diagnosis not present

## 2024-06-27 DIAGNOSIS — M170 Bilateral primary osteoarthritis of knee: Secondary | ICD-10-CM | POA: Diagnosis not present

## 2024-06-27 DIAGNOSIS — R2689 Other abnormalities of gait and mobility: Secondary | ICD-10-CM

## 2024-06-27 DIAGNOSIS — G8929 Other chronic pain: Secondary | ICD-10-CM | POA: Diagnosis not present

## 2024-06-27 DIAGNOSIS — L821 Other seborrheic keratosis: Secondary | ICD-10-CM | POA: Diagnosis not present

## 2024-06-27 DIAGNOSIS — Z4802 Encounter for removal of sutures: Secondary | ICD-10-CM | POA: Diagnosis not present

## 2024-06-27 DIAGNOSIS — M6281 Muscle weakness (generalized): Secondary | ICD-10-CM

## 2024-06-27 DIAGNOSIS — M5416 Radiculopathy, lumbar region: Secondary | ICD-10-CM | POA: Diagnosis not present

## 2024-06-27 DIAGNOSIS — R252 Cramp and spasm: Secondary | ICD-10-CM

## 2024-06-27 DIAGNOSIS — R269 Unspecified abnormalities of gait and mobility: Secondary | ICD-10-CM | POA: Diagnosis not present

## 2024-06-27 DIAGNOSIS — M5459 Other low back pain: Secondary | ICD-10-CM

## 2024-06-27 NOTE — Therapy (Signed)
 OUTPATIENT PHYSICAL THERAPY TREATMENT   Patient Name: Cory Barnett MRN: [id number] DOB:07-07-1946, 78 y.o., male Today's Date: 06/27/2024  END OF SESSION:  PT End of Session - 06/27/24 1109     Visit Number 3    Date for PT Re-Evaluation 08/14/24    Authorization Type Medicare B    Progress Note Due on Visit 10    PT Start Time 1017    PT Stop Time 1058    PT Time Calculation (min) 41 min    Activity Tolerance Patient tolerated treatment well    Behavior During Therapy WFL for tasks assessed/performed             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   IFG (impaired fasting glucose) 01/2016   Pre-diabetes    RBBB (right bundle branch block)    Rheumatic fever    age 48 or 69, in hospital for a month, out of school for a yr   Seasonal allergies    Squamous cell carcinoma    Testicular hypofunction    Past Surgical History:  Procedure Laterality Date   EYE SURGERY     right eye  cataract surgery with lens implant   NASAL SEPTUM SURGERY     NOSE SURGERY     REVERSE SHOULDER ARTHROPLASTY Right 01/11/2020   Procedure: REVERSE SHOULDER ARTHROPLASTY;  Surgeon: Dozier Soulier, MD;  Location: [facility];  Service: Orthopedics;  Laterality: Right;   SHOULDER ARTHROSCOPY WITH SUBACROMIAL DECOMPRESSION Right 08/07/2019   Procedure: SHOULDER ARTHROSCOPY WITH SUBACROMIAL DECOMPRESSION;  Surgeon: Dozier Soulier, MD;  Location: [facility];  Service: Orthopedics;  Laterality: Right;   SKIN CANCER EXCISION     basal and squamous cell    TONSILLECTOMY     VASECTOMY     WISDOM TOOTH EXTRACTION     Patient Active Problem List   Diagnosis Date Noted   Bursitis of both hips 02/23/2023   Degenerative arthritis of knee, bilateral 11/08/2022   Lumbar  radiculitis 08/05/2020   Left knee pain 08/05/2020   Piriformis syndrome of left side 08/18/2019   Rotator cuff tear, right 05/29/2019   Closed fracture of distal clavicle 05/15/2019   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    PCP: Shayne Anes, MD   REFERRING PROVIDER: Claudene Hussar, MD  REFERRING DIAG:  Diagnosis  M17.0 (ICD-10-CM) - Primary osteoarthritis of both knees  M54.16 (ICD-10-CM) - Lumbar radiculitis    THERAPY DIAG:  Chronic pain of left knee  Muscle weakness (generalized)  Other abnormalities of gait and mobility  Chronic pain of right knee  Other low back pain  Cramp and spasm  Rationale for Evaluation and Treatment: Rehabilitation  ONSET DATE: chronic with recent falls   SUBJECTIVE:   SUBJECTIVE STATEMENT: I made appts to see Dr Claudene for my back and Dr Vernetta for my knee.  I can't do the sit to stand exercise, it hurts too much.  My back pain is feeling better.    PERTINENT HISTORY: Anxiety, HTN, reverse shoulder replacement (2021), basal cell carcinoma PAIN:  Are you having pain? Yes: NPRS scale: knee 0-10/10, back 0-2/10 with standing (limited 20 minutes)  Pain location: Lt>Rt knee , low back  Pain  description: knees (sharp), low back (dull ache) Aggravating factors: going up steps, standing long periods, squatting to feed cat  Relieving factors: sitting down, not climbing steps   PRECAUTIONS: Fall  WEIGHT BEARING RESTRICTIONS: No  FALLS:  Has patient fallen in last 6 months? No  LIVING ENVIRONMENT: Lives with: lives alone, recent widower Lives in: House/apartment Stairs: Yes: External: 3 steps; on right going up Has following equipment at home: None  OCCUPATION: retired  PLOF: Independent, Vocation/Vocational requirements: desk work, walking-retired but small amount of work, and Leisure: walking for exercises Pilates every week in a small group, works out with trainer 2x/wk. 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 No acute osseous abnormalities  Right knee:  Degenerative changes. No acute osseous abnormalities.   US  on 12/10/23:  Limited muscular skeletal ultrasound was performed and interpreted by CLAUDENE HUSSAR, M  Left knee does have significant decrease in the hypoechoic changes noted.  Does have narrowing of the patellofemoral joint bilaterally but worsening on the right side. Impression: Worsening effusion of the right knee compared to left   COGNITION: Overall cognitive status: Within functional limits for tasks assessed     SENSATION: WFL  MUSCLE LENGTH: Hamstring flexibility limited by 25% bilaterally  POSTURE: rounded shoulders and forward head  PALPATION: Diffuse palpable tenderness in bil quads, and with patellar mobs/compression   LOWER EXTREMITY ROM:  Knee extension limited   LOWER EXTREMITY MMT:  MMT Right eval Left eval  Hip flexion 4 4  Hip extension 4+ 4+  Hip abduction 4 4  Hip adduction    Hip internal rotation    Hip external rotation    Knee flexion 4+ 4+  Knee extension 4+ 4+  Ankle dorsiflexion 5 5  Ankle plantarflexion    Ankle inversion    Ankle eversion     (Blank rows = not tested)   FUNCTIONAL TESTS:  06/19/24: 5x sit to stand: 20.44 seconds   Modified Oswestry: 14/50=28%  GAIT: Distance walked: 100 Assistive device utilized: None Level of assistance: Complete Independence Comments: bil knee flexion, wide base of support Steps: negotiating with alternating pattern and use of hands for support, pain with uncontrolled descent with descending    TODAY'S TREATMENT:                                                                                                                              DATE:  06/27/24 NuStep: Level 6x 8 min- PT present to discuss progress Seated hamstring stretch  3x20 seconds  Long arc quads: 5 hold 2x10 Standing hip abduction with TA activation 2x10 Heel raises 2x10 on foam pad Knee to chest 3x20 seconds  Sidelying clam 2x10 Trunk rotation 3x20 seconds    06/22/24 NuStep: Level 2x 8 min- PT present to discuss progress Seated hamstring  stretch 3x20 seconds  Gait with walking stick: around building and up 8 step to simulate stadium step Heel raises 2x10 Sit to stand: with pad under 2x10 Sidelying clam 2x10 Trigger Point Dry Needling  Initial Treatment: Pt instructed on Dry Needling rational, procedures, and possible side effects. Pt instructed to expect mild to moderate muscle soreness later in the day and/or into the next day.  Pt instructed in methods to reduce muscle soreness. Pt instructed to continue prescribed HEP. Because Dry Needling was performed over or adjacent to a lung field, pt was educated on S/S of pneumothorax and to seek immediate medical attention should they occur.  Patient was educated on signs and symptoms of infection and other risk factors and advised to seek medical attention should they occur.  Patient verbalized understanding of these instructions and education.   Patient Verbal Consent Given: Yes Education Handout Provided: Yes Muscles Treated: Bil lumbar multifidi  Electrical Stimulation Performed: No Treatment Response/Outcome: Utilized skilled palpation to identify bony landmarks and trigger points.  Able to illicit twitch response and muscle elongation.  Soft tissue mobilization to muscles needled following DN to further promote tissue elongation and decreased pain.      06/19/24 Findings from evaluation discussed, pt educated on plan of care, HEP initiated.    PATIENT EDUCATION:  Education details: Access Code: [email or web address], use walking stick for community distances and football stadiums Person educated: Patient Education method: Explanation, Facilities manager, and Handouts Education comprehension: verbalized  understanding and returned demonstration  HOME EXERCISE PROGRAM: Access Code: [email or web address] URL: [email or web address] Date: 06/19/2024 Prepared by: Burnard  Exercises - Sit to Stand Without Arm Support  - 2 x daily - 7 x weekly - 2 sets - 5 reps - Seated Hamstring Stretch  - 3 x daily - 7 x weekly - 1 sets - 3 reps - 20 hold - Supine Lower Trunk Rotation  - 3 x daily - 7 x weekly - 1 sets - 3 reps - 20 hold - Hooklying Single Knee to Chest Stretch  - 3 x daily - 7 x weekly - 1 sets - 3 reps - 20 hold - Seated Figure 4 Piriformis Stretch  - 3 x daily - 7 x weekly - 1 sets - 3 reps - 20 hold - Heel Raises with Counter Support  - 1 x daily - 7 x weekly - 3 sets - 10 reps  ASSESSMENT:  CLINICAL IMPRESSION: Pt reports reduced LBP since dry needling last visit with max pain of 3/10 and is doing exercises.  Too much pain with sit to stand so eliminated this and added long arc quads and standing hip abduction.  PT monitored for form and pain throughout session.   Patient will benefit from skilled PT to address the below impairments and improve overall function.   OBJECTIVE IMPAIRMENTS: Abnormal gait, decreased activity tolerance, decreased balance, decreased endurance, difficulty walking, decreased strength, decreased safety awareness, increased muscle spasms, impaired flexibility, improper body mechanics, and pain.   ACTIVITY LIMITATIONS: carrying, lifting, standing, squatting, stairs, transfers, and locomotion level  PARTICIPATION LIMITATIONS: cleaning, laundry, community activity, and yard work  PERSONAL FACTORS: Age, Time since onset of injury/illness/exacerbation, and 1 comorbidity: OA are also affecting patient's functional outcome.   REHAB POTENTIAL: Good  CLINICAL DECISION MAKING: Evolving/moderate complexity  EVALUATION COMPLEXITY: Moderate   GOALS: Goals reviewed with patient? Yes  SHORT TERM GOALS: Target date: 07/17/2024     Be independent in initial  HEP Baseline: Goal status: INITIAL  2.  Perform 5x sit  to stand in < 16 seconds without UE  support to reduce falls risk Baseline: 20.44 Goal status: INITIAL  3.  Improve bil knee strength and stability to ascend 8 with use of 1 rail for safety in negotiating steps at high school football games Baseline:  Goal status: INITIAL  4.  Stand > or = to 30 min without limitation due to LBP.   Baseline: 20 min max  Goal Status: INITIAL  LONG TERM GOALS: Target date: 08/14/2024    Be independent in advanced HEP Baseline:  Goal status: INITIAL  2.  Perform 5x sit to stand in < or = to 13 seconds to reduce falls risk  Baseline: 20.44 seconds  Goal status: INITIAL  3.  Improve Modified Oswestry to < or = to 18% disability  Baseline: 28% disability  Goal status: INITIAL  4.  Stand for > or = to 45 min without limitation due to LBP Baseline: 20 min max  Goal status: INITIAL  5.  Improve LE strength to squat to feed his cat and return to standing with 50% increased ease  Baseline: pain and difficulty getting up Goal status: INITIAL PLAN:  PT FREQUENCY: 2x/week  PT DURATION: 8 weeks  PLANNED INTERVENTIONS: Therapeutic exercises, Therapeutic activity, Neuromuscular re-education, Balance training, Gait training, Patient/Family education, Self Care, Joint mobilization, Stair training, Vestibular training, Aquatic Therapy, Dry Needling, Cryotherapy, Moist heat, Manual therapy, and Re-evaluation  PLAN FOR NEXT SESSION: Quad and glute strength, balance tasks, hip and knee flexibility, functional mobility, work on steps for football stadium. Dry needling to low back again next visit if needed.     Burnard Joy, PT 06/27/24 11:26 AM   [facility] [address], [address] [place], [place] [address] Phone # [phone] Fax [phone]

## 2024-06-29 ENCOUNTER — Ambulatory Visit

## 2024-06-29 DIAGNOSIS — M5416 Radiculopathy, lumbar region: Secondary | ICD-10-CM | POA: Diagnosis not present

## 2024-06-29 DIAGNOSIS — G8929 Other chronic pain: Secondary | ICD-10-CM

## 2024-06-29 DIAGNOSIS — M25562 Pain in left knee: Secondary | ICD-10-CM | POA: Diagnosis not present

## 2024-06-29 DIAGNOSIS — M5459 Other low back pain: Secondary | ICD-10-CM

## 2024-06-29 DIAGNOSIS — R269 Unspecified abnormalities of gait and mobility: Secondary | ICD-10-CM | POA: Diagnosis not present

## 2024-06-29 DIAGNOSIS — R252 Cramp and spasm: Secondary | ICD-10-CM

## 2024-06-29 DIAGNOSIS — R2689 Other abnormalities of gait and mobility: Secondary | ICD-10-CM

## 2024-06-29 DIAGNOSIS — M170 Bilateral primary osteoarthritis of knee: Secondary | ICD-10-CM | POA: Diagnosis not present

## 2024-06-29 DIAGNOSIS — M6281 Muscle weakness (generalized): Secondary | ICD-10-CM

## 2024-06-29 NOTE — Therapy (Signed)
 OUTPATIENT PHYSICAL THERAPY TREATMENT   Patient Name: Cory Barnett MRN: [id number] DOB:07-13-1946, 78 y.o., male Today's Date: 06/29/2024  END OF SESSION:  PT End of Session - 06/29/24 1101     Visit Number 4    Date for PT Re-Evaluation 08/14/24    Authorization Type Medicare B    Progress Note Due on Visit 10    PT Start Time 1016    PT Stop Time 1058    PT Time Calculation (min) 42 min    Activity Tolerance Patient tolerated treatment well    Behavior During Therapy WFL for tasks assessed/performed              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   IFG (impaired fasting glucose) 01/2016   Pre-diabetes    RBBB (right bundle branch block)    Rheumatic fever    age 20 or 76, in hospital for a month, out of school for a yr   Seasonal allergies    Squamous cell carcinoma    Testicular hypofunction    Past Surgical History:  Procedure Laterality Date   EYE SURGERY     right eye  cataract surgery with lens implant   NASAL SEPTUM SURGERY     NOSE SURGERY     REVERSE SHOULDER ARTHROPLASTY Right 01/11/2020   Procedure: REVERSE SHOULDER ARTHROPLASTY;  Surgeon: Dozier Soulier, MD;  Location: [facility];  Service: Orthopedics;  Laterality: Right;   SHOULDER ARTHROSCOPY WITH SUBACROMIAL DECOMPRESSION Right 08/07/2019   Procedure: SHOULDER ARTHROSCOPY WITH SUBACROMIAL DECOMPRESSION;  Surgeon: Dozier Soulier, MD;  Location: [facility];  Service: Orthopedics;  Laterality: Right;   SKIN CANCER EXCISION     basal and squamous cell    TONSILLECTOMY     VASECTOMY     WISDOM TOOTH EXTRACTION     Patient Active Problem List   Diagnosis Date Noted   Bursitis of both hips 02/23/2023   Degenerative arthritis of knee, bilateral 11/08/2022   Lumbar  radiculitis 08/05/2020   Left knee pain 08/05/2020   Piriformis syndrome of left side 08/18/2019   Rotator cuff tear, right 05/29/2019   Closed fracture of distal clavicle 05/15/2019   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  I don't think I need dry needling again yet.    PERTINENT HISTORY: Anxiety, HTN, reverse shoulder replacement (2021), basal cell carcinoma PAIN:  Are you having pain? Yes: NPRS scale: knee 0-10/10, back 0-2/10 with standing  Pain location: Lt>Rt knee , low back  Pain description: knees (sharp), low back (dull ache) Aggravating factors: going up steps, standing long periods, squatting to feed cat  Relieving  factors: sitting down, not climbing steps   PRECAUTIONS: Fall  WEIGHT BEARING RESTRICTIONS: No  FALLS:  Has patient fallen in last 6 months? No  LIVING ENVIRONMENT: Lives with: lives alone, recent widower Lives in: House/apartment Stairs: Yes: External: 3 steps; on right going up Has following equipment at home: None  OCCUPATION: retired  PLOF: Independent, Vocation/Vocational requirements: desk work, walking-retired but small amount of work, and Leisure: walking for exercises Pilates every week in a small group, works out with trainer 2x/wk. Goes to HS football games to watch grandsons  PATIENT GOALS: reduce pain, improve balance   NEXT MD VISIT: 6 weeks   OBJECTIVE:    DIAGNOSTIC FINDINGS: X-ray: Left knee Degenerative changes. No acute osseous abnormalities  Right knee:  Degenerative changes. No acute osseous abnormalities.   US  on 12/10/23:  Limited muscular skeletal ultrasound was performed and interpreted by CLAUDENE HUSSAR, M  Left knee does have significant decrease in the hypoechoic changes noted.  Does have narrowing of the patellofemoral joint bilaterally but worsening on the right side. Impression: Worsening effusion of the right knee compared to left   COGNITION: Overall cognitive status: Within functional limits for tasks assessed     SENSATION: WFL  MUSCLE LENGTH: Hamstring flexibility limited by 25% bilaterally  POSTURE: rounded shoulders and forward head  PALPATION: Diffuse palpable tenderness in bil quads, and with patellar mobs/compression   LOWER EXTREMITY ROM:  Knee extension limited   LOWER EXTREMITY MMT:  MMT Right eval Left eval  Hip flexion 4 4  Hip extension 4+ 4+  Hip abduction 4 4  Hip adduction    Hip internal rotation    Hip external rotation    Knee flexion 4+ 4+  Knee extension 4+ 4+  Ankle dorsiflexion 5 5  Ankle plantarflexion    Ankle inversion    Ankle eversion     (Blank rows = not tested)   FUNCTIONAL TESTS:  06/19/24: 5x sit to stand: 20.44 seconds   Modified Oswestry: 14/50=28%  GAIT: Distance walked: 100 Assistive device utilized: None Level of assistance: Complete Independence Comments: bil knee flexion, wide base of support Steps: negotiating with alternating pattern and use of hands for support, pain with uncontrolled descent with descending    TODAY'S TREATMENT:                                                                                                                              DATE:  06/29/24 NuStep: Level 6x 8 min- PT present to discuss progress Seated hamstring stretch 3x20 seconds  Long arc quads: 5 hold 2x10- added 2#  Standing hip abduction with TA activation  2x10 2# added  Heel raises 2x10 on foam pad Hurdles: forward and lateral with min UE support  Pallof press: red band x20  Leg press: seat 9, bil 70# x20, Rt 45#, Lt 35# x15 Alternating step taps 8 with min UE support x20   06/27/24 NuStep: Level 6x 8 min-  PT present to discuss progress Seated hamstring stretch 3x20 seconds  Long arc quads: 5 hold 2x10 Standing hip abduction with TA activation 2x10 Heel raises 2x10 on foam pad Knee to chest 3x20 seconds  Sidelying clam 2x10 Trunk rotation 3x20 seconds    06/22/24 NuStep: Level 2x 8 min- PT present to discuss progress Seated hamstring stretch 3x20 seconds  Gait with walking stick: around building and up 8 step to simulate stadium step Heel raises 2x10 Sit to stand: with pad under 2x10 Sidelying clam 2x10 Trigger Point Dry Needling  Initial Treatment: Pt instructed on Dry Needling rational, procedures, and possible side effects. Pt instructed to expect mild to moderate muscle soreness later in the day and/or into the next day.  Pt instructed in methods to reduce muscle soreness. Pt instructed to continue prescribed HEP. Because Dry Needling was performed over or adjacent to a lung field, pt was educated on S/S of pneumothorax and to seek immediate medical attention should they occur.  Patient was educated on signs and symptoms of infection and other risk factors and advised to seek medical attention should they occur.  Patient verbalized understanding of these instructions and education.   Patient Verbal Consent Given: Yes Education Handout Provided: Yes Muscles Treated: Bil lumbar multifidi  Electrical Stimulation Performed: No Treatment Response/Outcome: Utilized skilled palpation to identify bony landmarks and trigger points.  Able to illicit twitch response and muscle elongation.  Soft tissue mobilization to muscles needled following DN to further promote tissue elongation and decreased pain.       PATIENT EDUCATION:   Education details: Access Code: [email or web address], use walking stick for community distances and football stadiums Person educated: Patient Education method: Explanation, Facilities manager, and Handouts Education comprehension: verbalized understanding and returned demonstration  HOME EXERCISE PROGRAM: Access Code: [email or web address] URL: [email or web address] Date: 06/19/2024 Prepared by: Burnard  Exercises - Sit to Stand Without Arm Support  - 2 x daily - 7 x weekly - 2 sets - 5 reps - Seated Hamstring Stretch  - 3 x daily - 7 x weekly - 1 sets - 3 reps - 20 hold - Supine Lower Trunk Rotation  - 3 x daily - 7 x weekly - 1 sets - 3 reps - 20 hold - Hooklying Single Knee to Chest Stretch  - 3 x daily - 7 x weekly - 1 sets - 3 reps - 20 hold - Seated Figure 4 Piriformis Stretch  - 3 x daily - 7 x weekly - 1 sets - 3 reps - 20 hold - Heel Raises with Counter Support  - 1 x daily - 7 x weekly - 3 sets - 10 reps  ASSESSMENT:  CLINICAL IMPRESSION: Pt reports 50% reduction in LBP after dry needling last week.  He is doing well with exercises and advancement of weights today. PT monitored for safety, pain and form throughout session. He did well with stabilization and balance exercises and no increased pain with leg press today.   Patient will benefit from skilled PT to address the below impairments and improve overall function.   OBJECTIVE IMPAIRMENTS: Abnormal gait, decreased activity tolerance, decreased balance, decreased endurance, difficulty walking, decreased strength, decreased safety awareness, increased muscle spasms, impaired flexibility, improper body mechanics, and pain.   ACTIVITY LIMITATIONS: carrying, lifting, standing, squatting, stairs, transfers, and locomotion level  PARTICIPATION LIMITATIONS: cleaning, laundry, community activity, and yard work  PERSONAL FACTORS: Age, Time since onset of injury/illness/exacerbation, and 1 comorbidity: OA are also affecting patient's functional  outcome.   REHAB POTENTIAL:  Good  CLINICAL DECISION MAKING: Evolving/moderate complexity  EVALUATION COMPLEXITY: Moderate   GOALS: Goals reviewed with patient? Yes  SHORT TERM GOALS: Target date: 07/17/2024     Be independent in initial HEP Baseline: consistent with HEP Goal status: MET  2.  Perform 5x sit to stand in < 16 seconds without UE  support to reduce falls risk Baseline: 20.44 Goal status: INITIAL  3.  Improve bil knee strength and stability to ascend 8 with use of 1 rail for safety in negotiating steps at high school football games Baseline:  Goal status: INITIAL  4.  Stand > or = to 30 min without limitation due to LBP.   Baseline: 20 min max  Goal Status: INITIAL  LONG TERM GOALS: Target date: 08/14/2024    Be independent in advanced HEP Baseline:  Goal status: INITIAL  2.  Perform 5x sit to stand in < or = to 13 seconds to reduce falls risk  Baseline: 20.44 seconds  Goal status: INITIAL  3.  Improve Modified Oswestry to < or = to 18% disability  Baseline: 28% disability  Goal status: INITIAL  4.  Stand for > or = to 45 min without limitation due to LBP Baseline: 20 min max  Goal status: INITIAL  5.  Improve LE strength to squat to feed his cat and return to standing with 50% increased ease  Baseline: pain and difficulty getting up Goal status: INITIAL PLAN:  PT FREQUENCY: 2x/week  PT DURATION: 8 weeks  PLANNED INTERVENTIONS: Therapeutic exercises, Therapeutic activity, Neuromuscular re-education, Balance training, Gait training, Patient/Family education, Self Care, Joint mobilization, Stair training, Vestibular training, Aquatic Therapy, Dry Needling, Cryotherapy, Moist heat, Manual therapy, and Re-evaluation  PLAN FOR NEXT SESSION: Quad and glute strength, balance tasks, hip and knee flexibility, functional mobility, work on steps for football stadium. Dry needling to low back again next visit if needed.     Burnard Joy, PT 06/29/24  11:02 AM   [facility] [address], [address] [place], [place] [address] Phone # [phone] Fax [phone]

## 2024-07-04 ENCOUNTER — Ambulatory Visit: Attending: Family Medicine

## 2024-07-04 ENCOUNTER — Encounter

## 2024-07-04 DIAGNOSIS — R252 Cramp and spasm: Secondary | ICD-10-CM | POA: Diagnosis not present

## 2024-07-04 DIAGNOSIS — M25562 Pain in left knee: Secondary | ICD-10-CM | POA: Insufficient documentation

## 2024-07-04 DIAGNOSIS — R2689 Other abnormalities of gait and mobility: Secondary | ICD-10-CM | POA: Insufficient documentation

## 2024-07-04 DIAGNOSIS — G8929 Other chronic pain: Secondary | ICD-10-CM | POA: Insufficient documentation

## 2024-07-04 DIAGNOSIS — M25561 Pain in right knee: Secondary | ICD-10-CM | POA: Insufficient documentation

## 2024-07-04 DIAGNOSIS — M6281 Muscle weakness (generalized): Secondary | ICD-10-CM | POA: Diagnosis not present

## 2024-07-04 DIAGNOSIS — M5459 Other low back pain: Secondary | ICD-10-CM | POA: Diagnosis not present

## 2024-07-04 NOTE — Therapy (Signed)
 OUTPATIENT PHYSICAL THERAPY TREATMENT   Patient Name: Cory Barnett MRN: [id number] DOB:03/25/1946, 78 y.o., male Today's Date: 07/04/2024  END OF SESSION:  PT End of Session - 07/04/24 0846     Visit Number 5    Date for PT Re-Evaluation 08/14/24    Authorization Type Medicare B    Progress Note Due on Visit 10    PT Start Time 0802    PT Stop Time 0846    PT Time Calculation (min) 44 min    Activity Tolerance Patient tolerated treatment well    Behavior During Therapy [facility] for tasks assessed/performed               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   IFG (impaired fasting glucose) 01/2016   Pre-diabetes    RBBB (right bundle branch block)    Rheumatic fever    age 70 or 33, in hospital for a month, out of school for a yr   Seasonal allergies    Squamous cell carcinoma    Testicular hypofunction    Past Surgical History:  Procedure Laterality Date   EYE SURGERY     right eye  cataract surgery with lens implant   NASAL SEPTUM SURGERY     NOSE SURGERY     REVERSE SHOULDER ARTHROPLASTY Right 01/11/2020   Procedure: REVERSE SHOULDER ARTHROPLASTY;  Surgeon: Dozier Soulier, MD;  Location: [facility];  Service: Orthopedics;  Laterality: Right;   SHOULDER ARTHROSCOPY WITH SUBACROMIAL DECOMPRESSION Right 08/07/2019   Procedure: SHOULDER ARTHROSCOPY WITH SUBACROMIAL DECOMPRESSION;  Surgeon: Dozier Soulier, MD;  Location: [facility];  Service: Orthopedics;  Laterality: Right;   SKIN CANCER EXCISION     basal and squamous cell    TONSILLECTOMY     VASECTOMY     WISDOM TOOTH EXTRACTION     Patient Active Problem List   Diagnosis Date Noted   Bursitis of both hips 02/23/2023   Degenerative arthritis of knee, bilateral 11/08/2022   Lumbar  radiculitis 08/05/2020   Left knee pain 08/05/2020   Piriformis syndrome of left side 08/18/2019   Rotator cuff tear, right 05/29/2019   Closed fracture of distal clavicle 05/15/2019   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    PCP: Shayne Anes, MD   REFERRING PROVIDER: Claudene Hussar, MD  REFERRING DIAG:  Diagnosis  M17.0 (ICD-10-CM) - Primary osteoarthritis of both knees  M54.16 (ICD-10-CM) - Lumbar radiculitis    THERAPY DIAG:  Chronic pain of left knee  Muscle weakness (generalized)  Other abnormalities of gait and mobility  Chronic pain of right knee  Other low back pain  Cramp and spasm  Rationale for Evaluation and Treatment: Rehabilitation  ONSET DATE: chronic with recent falls   SUBJECTIVE:   SUBJECTIVE STATEMENT: My knee feels the same, I'll see Dr Vernetta soon.  My low back is feeling much better.   PERTINENT HISTORY: Anxiety, HTN, reverse shoulder replacement (2021), basal cell carcinoma  PAIN: 07/04/24 Are you having pain? Yes: NPRS scale: knee 0-10/10, back 0-2/10 with standing  Pain location: Lt>Rt knee , low back  Pain description: knees (sharp), low back (dull ache) Aggravating factors: going up steps, standing long periods, squatting to feed cat  Relieving factors: sitting down, not climbing steps   PRECAUTIONS: Fall  WEIGHT BEARING RESTRICTIONS: No  FALLS:  Has patient fallen in last 6 months? No  LIVING ENVIRONMENT: Lives with: lives alone, recent widower Lives in: House/apartment Stairs: Yes: External: 3 steps; on right going up Has following equipment at home: None  OCCUPATION: retired  PLOF: Independent, Vocation/Vocational requirements: desk work, walking-retired but small amount of work, and Leisure: walking for exercises Pilates every week in a small group, works out with trainer 2x/wk. Goes to HS football games to watch grandsons  PATIENT GOALS: reduce pain, improve balance   NEXT MD VISIT: 6 weeks   OBJECTIVE:    DIAGNOSTIC FINDINGS: X-ray: Left knee Degenerative changes. No acute osseous abnormalities  Right knee:  Degenerative changes. No acute osseous abnormalities.   US  on 12/10/23:  Limited muscular skeletal ultrasound was performed and interpreted by CLAUDENE HUSSAR, M  Left knee does have significant decrease in the hypoechoic changes noted.  Does have narrowing of the patellofemoral joint bilaterally but worsening on the right side. Impression: Worsening effusion of the right knee compared to left   COGNITION: Overall cognitive status: Within functional limits for tasks assessed     SENSATION: WFL  MUSCLE LENGTH: Hamstring flexibility limited by 25% bilaterally  POSTURE: rounded shoulders and forward head  PALPATION: Diffuse palpable tenderness in bil quads, and with patellar mobs/compression   LOWER EXTREMITY ROM:  Knee extension limited   LOWER EXTREMITY MMT:  MMT Right eval Left eval  Hip flexion 4 4  Hip extension 4+ 4+  Hip abduction 4 4  Hip adduction    Hip internal rotation    Hip external rotation    Knee flexion 4+ 4+  Knee extension 4+ 4+  Ankle dorsiflexion 5 5  Ankle plantarflexion    Ankle inversion    Ankle eversion     (Blank rows = not tested)   FUNCTIONAL TESTS:  06/19/24: 5x sit to stand: 20.44 seconds   Modified Oswestry: 14/50=28%  GAIT: Distance walked: 100 Assistive device utilized: None Level of assistance: Complete Independence Comments: bil knee flexion, wide base of support Steps: negotiating with alternating pattern and use of hands for support, pain with uncontrolled descent with descending    TODAY'S TREATMENT:                                                                                                                              DATE:   07/04/24 NuStep: Level 6x 8 min- PT present to discuss progress Seated hamstring stretch 3x20 seconds  Long arc quads: 5 hold 2x10- added 2#  Standing hip abduction with TA activation  2x10 2# added  Farmer's carry: 5# KB 2 laps around CR gym each hand position Pallof press: red band x20  Alternating step taps 8 with min UE support x20  06/29/24 NuStep: Level 6x 8 min- PT present to discuss progress Seated hamstring stretch 3x20 seconds  Long arc quads:  5 hold 2x10- added 2#  Standing hip abduction with TA activation 2x10 2# added  Heel raises 2x10 on foam pad Hurdles: forward and lateral with min UE support  Pallof press: red band x20  Leg press: seat 9, bil 70# x20, Rt 45#, Lt 35# x15 Alternating step taps 8 with min UE support x20   06/27/24 NuStep: Level 6x 8 min- PT present to discuss progress Seated hamstring stretch 3x20 seconds  Long arc quads: 5 hold 2x10 Standing hip abduction with TA activation 2x10 Heel raises 2x10 on foam pad Knee to chest 3x20 seconds  Sidelying clam 2x10 Trunk rotation 3x20 seconds    PATIENT EDUCATION:  Education details: Access Code: [email or web address], use walking stick for community distances and football stadiums Person educated: Patient Education method: Programmer, multimedia, Facilities manager, and Handouts Education comprehension: verbalized understanding and returned demonstration  HOME EXERCISE PROGRAM: Access Code: [email or web address] URL: [email or web address] Date: 06/19/2024 Prepared by: Burnard  Exercises - Sit to Stand Without Arm Support  - 2 x daily - 7 x weekly - 2 sets - 5 reps - Seated Hamstring Stretch  - 3 x daily - 7 x weekly - 1 sets - 3 reps - 20 hold - Supine Lower Trunk Rotation  - 3 x daily - 7 x weekly - 1 sets - 3 reps - 20 hold - Hooklying Single Knee to Chest Stretch  - 3 x daily - 7 x weekly - 1 sets - 3 reps - 20 hold - Seated Figure 4 Piriformis Stretch  - 3 x daily - 7 x weekly - 1 sets - 3 reps - 20 hold - Heel Raises with Counter Support  - 1 x daily - 7 x weekly - 3 sets - 10 reps  ASSESSMENT:  CLINICAL IMPRESSION: Pt continues to report reduced LBP overall and is able to stand longer without LBP.   He stood during halftime at a football game without limitation.  PT supervised pt throughout session for safety, technique and to monitor for pain. PT with Lt knee OA and PT will benefit him for strength and flexibility progression.  Patient will benefit from skilled PT to address the below impairments and improve overall function.   OBJECTIVE IMPAIRMENTS: Abnormal gait, decreased activity tolerance, decreased balance, decreased endurance, difficulty walking, decreased strength, decreased safety awareness, increased muscle spasms, impaired flexibility, improper body mechanics, and pain.   ACTIVITY LIMITATIONS: carrying, lifting, standing, squatting, stairs, transfers, and locomotion level  PARTICIPATION LIMITATIONS: cleaning, laundry, community activity, and yard work  PERSONAL FACTORS: Age, Time since onset of injury/illness/exacerbation, and 1 comorbidity: OA are also affecting patient's functional outcome.   REHAB POTENTIAL: Good  CLINICAL DECISION MAKING: Evolving/moderate complexity  EVALUATION COMPLEXITY: Moderate   GOALS: Goals reviewed with patient? Yes  SHORT TERM GOALS: Target date: 07/17/2024     Be independent in initial HEP Baseline: consistent with HEP Goal status: MET  2.  Perform 5x sit to stand in < 16 seconds without UE  support to reduce falls risk Baseline: 20.44 Goal status: INITIAL  3.  Improve bil knee strength and stability to ascend 8 with use of 1 rail for safety in negotiating steps at high school football games Baseline: working on this and using walking stick (07/04/24) Goal status: INITIAL  4.  Stand > or = to 30 min without limitation due to LBP.   Baseline: 20 min max  Goal Status: INITIAL  LONG TERM GOALS: Target date: 08/14/2024    Be independent in advanced HEP Baseline:  Goal  status: IN PROGRESS  2.  Perform 5x sit to stand in < or = to 13 seconds to reduce falls risk  Baseline: 20.44 seconds  Goal status: INITIAL  3.  Improve  Modified Oswestry to < or = to 18% disability  Baseline: 28% disability  Goal status: INITIAL  4.  Stand for > or = to 45 min without limitation due to LBP Baseline: 20 min max  Goal status: INITIAL  5.  Improve LE strength to squat to feed his cat and return to standing with 50% increased ease  Baseline: pain and difficulty getting up Goal status: INITIAL PLAN:  PT FREQUENCY: 2x/week  PT DURATION: 8 weeks  PLANNED INTERVENTIONS: Therapeutic exercises, Therapeutic activity, Neuromuscular re-education, Balance training, Gait training, Patient/Family education, Self Care, Joint mobilization, Stair training, Vestibular training, Aquatic Therapy, Dry Needling, Cryotherapy, Moist heat, Manual therapy, and Re-evaluation  PLAN FOR NEXT SESSION: Quad and glute strength, balance tasks, hip and knee flexibility, functional mobility, work on steps for football stadium. Dry needling to low back again next visit if needed.  Leg press again   Burnard Joy, PT 07/04/24 8:49 AM   [facility] [address] [place], [place] [address] Phone # [phone] Fax [phone]

## 2024-07-06 ENCOUNTER — Ambulatory Visit

## 2024-07-06 DIAGNOSIS — M5459 Other low back pain: Secondary | ICD-10-CM | POA: Diagnosis not present

## 2024-07-06 DIAGNOSIS — M6281 Muscle weakness (generalized): Secondary | ICD-10-CM | POA: Diagnosis not present

## 2024-07-06 DIAGNOSIS — G8929 Other chronic pain: Secondary | ICD-10-CM

## 2024-07-06 DIAGNOSIS — R252 Cramp and spasm: Secondary | ICD-10-CM

## 2024-07-06 DIAGNOSIS — R2689 Other abnormalities of gait and mobility: Secondary | ICD-10-CM | POA: Diagnosis not present

## 2024-07-06 DIAGNOSIS — M25562 Pain in left knee: Secondary | ICD-10-CM | POA: Diagnosis not present

## 2024-07-06 DIAGNOSIS — M25561 Pain in right knee: Secondary | ICD-10-CM | POA: Diagnosis not present

## 2024-07-06 NOTE — Therapy (Signed)
 OUTPATIENT PHYSICAL THERAPY TREATMENT   Patient Name: Cory Barnett MRN: [id number] DOB:[birth year], 78 y.o., male Today's Date: 07/06/2024  END OF SESSION:  PT End of Session - 07/06/24 1100     Visit Number 6    Date for PT Re-Evaluation 08/14/24    Authorization Type Medicare B    Progress Note Due on Visit 10    PT Start Time 1016    PT Stop Time 1100    PT Time Calculation (min) 44 min    Activity Tolerance Patient tolerated treatment well    Behavior During Therapy WFL for tasks assessed/performed                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   IFG (impaired fasting glucose) 01/2016   Pre-diabetes    RBBB (right bundle branch block)    Rheumatic fever    age 58 or 15, in hospital for a month, out of school for a yr   Seasonal allergies    Squamous cell carcinoma    Testicular hypofunction    Past Surgical History:  Procedure Laterality Date   EYE SURGERY     right eye  cataract surgery with lens implant   NASAL SEPTUM SURGERY     NOSE SURGERY     REVERSE SHOULDER ARTHROPLASTY Right 01/11/2020   Procedure: REVERSE SHOULDER ARTHROPLASTY;  Surgeon: Dozier Soulier, MD;  Location: [facility];  Service: Orthopedics;  Laterality: Right;   SHOULDER ARTHROSCOPY WITH SUBACROMIAL DECOMPRESSION Right 08/07/2019   Procedure: SHOULDER ARTHROSCOPY WITH SUBACROMIAL DECOMPRESSION;  Surgeon: Dozier Soulier, MD;  Location: [facility];  Service: Orthopedics;  Laterality: Right;   SKIN CANCER EXCISION     basal and squamous cell    TONSILLECTOMY     VASECTOMY     WISDOM TOOTH EXTRACTION     Patient Active Problem List   Diagnosis Date Noted   Bursitis of both hips 02/23/2023   Degenerative arthritis of knee, bilateral 11/08/2022   Lumbar  radiculitis 08/05/2020   Left knee pain 08/05/2020   Piriformis syndrome of left side 08/18/2019   Rotator cuff tear, right 05/29/2019   Closed fracture of distal clavicle 05/15/2019   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    PCP: Shayne Anes, MD   REFERRING PROVIDER: Claudene Hussar, MD  REFERRING DIAG:  Diagnosis  M17.0 (ICD-10-CM) - Primary osteoarthritis of both knees  M54.16 (ICD-10-CM) - Lumbar radiculitis    THERAPY DIAG:  Chronic pain of left knee  Muscle weakness (generalized)  Other abnormalities of gait and mobility  Chronic pain of right knee  Other low back pain  Cramp and spasm  Rationale for Evaluation and Treatment: Rehabilitation  ONSET DATE: chronic with recent falls   SUBJECTIVE:   SUBJECTIVE STATEMENT: Back is feeling good.  Sometimes I wake up with pain and just change position.  I haven't been doing my exercises the past 2 days.  I've been walking in my neighborhood for 30 min.    PERTINENT HISTORY: Anxiety, HTN, reverse shoulder replacement (2021), basal cell carcinoma  PAIN: 07/04/24 Are you having pain? Yes: NPRS scale: knee 0-10/10, back 0-2/10 with standing  Pain location: Lt>Rt knee , low back  Pain description: knees (sharp), low back (dull ache) Aggravating factors: going up steps, standing long periods, squatting to feed cat  Relieving factors: sitting down, not climbing steps   PRECAUTIONS: Fall  WEIGHT BEARING RESTRICTIONS: No  FALLS:  Has patient fallen in last 6 months? No  LIVING ENVIRONMENT: Lives with: lives alone, recent widower Lives in: House/apartment Stairs: Yes: External: 3 steps; on right going up Has following equipment at home: None  OCCUPATION: retired  PLOF: Independent, Vocation/Vocational requirements: desk work, walking-retired but small amount of work, and Leisure: walking for exercises Pilates every week in a small group, works out with trainer 2x/wk. 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 No acute osseous abnormalities  Right knee:  Degenerative changes. No acute osseous abnormalities.   US  on 12/10/23:  Limited muscular skeletal ultrasound was performed and interpreted by CLAUDENE HUSSAR, M  Left knee does have significant decrease in the hypoechoic changes noted.  Does have narrowing of the patellofemoral joint bilaterally but worsening on the right side. Impression: Worsening effusion of the right knee compared to left   COGNITION: Overall cognitive status: Within functional limits for tasks assessed     SENSATION: WFL  MUSCLE LENGTH: Hamstring flexibility limited by 25% bilaterally  POSTURE: rounded shoulders and forward head  PALPATION: Diffuse palpable tenderness in bil quads, and with patellar mobs/compression   LOWER EXTREMITY ROM:  Knee extension limited   LOWER EXTREMITY MMT:  MMT Right eval Left eval  Hip flexion 4 4  Hip extension 4+ 4+  Hip abduction 4 4  Hip adduction    Hip internal rotation    Hip external rotation    Knee flexion 4+ 4+  Knee extension 4+ 4+  Ankle dorsiflexion 5 5  Ankle plantarflexion    Ankle inversion    Ankle eversion     (Blank rows = not tested)   FUNCTIONAL TESTS:  06/19/24: 5x sit to stand: 20.44 seconds   Modified Oswestry: 14/50=28%  GAIT: Distance walked: 100 Assistive device utilized: None Level of assistance: Complete Independence Comments: bil knee flexion, wide base of support Steps: negotiating with alternating pattern and use of hands for support, pain with uncontrolled descent with descending    TODAY'S TREATMENT:                                                                                                                              DATE:   07/06/24 NuStep: Level 6x 8 min- PT present to discuss progress Seated hamstring stretch  3x20 seconds  Long arc quads: 5 hold 2x10- added 2#  Standing hip abduction with TA activation 2x10 2# added standing on balance pad Farmer's carry: 5# KB 2 laps around both gyms each hand position Pallof press: green band x20  Alternating step taps 6 with min  UE support and 2# ankle weights   07/04/24 NuStep: Level 6x 8 min- PT present to discuss progress Seated hamstring stretch 3x20 seconds  Long arc quads: 5 hold 2x10- added 2#  Standing hip abduction with TA activation 2x10 2# added  Farmer's carry: 5# KB 2 laps around CR gym each hand position Pallof press: red band x20  Alternating step taps 8 with min UE support x20  06/29/24 NuStep: Level 6x 8 min- PT present to discuss progress Seated hamstring stretch 3x20 seconds  Long arc quads: 5 hold 2x10- added 2#  Standing hip abduction with TA activation 2x10 2# added  Heel raises 2x10 on foam pad Hurdles: forward and lateral with min UE support  Pallof press: red band x20  Leg press: seat 9, bil 70# x20, Rt 45#, Lt 35# x15 Alternating step taps 8 with min UE support x20   PATIENT EDUCATION:  Education details: Access Code: [email or web address], use walking stick for community distances and football stadiums Person educated: Patient Education method: Programmer, multimedia, Facilities manager, and Handouts Education comprehension: verbalized understanding and returned demonstration  HOME EXERCISE PROGRAM: Access Code: [email or web address] URL: [email or web address] Date: 06/19/2024 Prepared by: Burnard  Exercises - Sit to Stand Without Arm Support  - 2 x daily - 7 x weekly - 2 sets - 5 reps - Seated Hamstring Stretch  - 3 x daily - 7 x weekly - 1 sets - 3 reps - 20 hold - Supine Lower Trunk Rotation  - 3 x daily - 7 x weekly - 1 sets - 3 reps - 20 hold - Hooklying Single Knee to Chest Stretch  - 3 x daily - 7 x weekly - 1 sets - 3 reps - 20 hold - Seated Figure 4 Piriformis Stretch  - 3 x daily - 7 x weekly - 1 sets - 3 reps - 20 hold - Heel  Raises with Counter Support  - 1 x daily - 7 x weekly - 3 sets - 10 reps  ASSESSMENT:  CLINICAL IMPRESSION: Pt reports that he has not been as consistent with HEP due to being busy with appts.  He reports significant reduction in LBP overall and denies significant limitation in functional tasks due to pain. PT with Lt knee OA and PT will benefit him for strength and flexibility progression.  Patient will benefit from skilled PT to address the below impairments and improve overall function.   OBJECTIVE IMPAIRMENTS: Abnormal gait, decreased activity tolerance, decreased balance, decreased endurance, difficulty walking, decreased strength, decreased safety awareness, increased muscle spasms, impaired flexibility, improper body mechanics, and pain.   ACTIVITY LIMITATIONS: carrying, lifting, standing, squatting, stairs, transfers, and locomotion level  PARTICIPATION LIMITATIONS: cleaning, laundry, community activity, and yard work  PERSONAL FACTORS: Age, Time since onset of injury/illness/exacerbation, and 1 comorbidity: OA are also affecting patient's functional outcome.   REHAB POTENTIAL: Good  CLINICAL DECISION MAKING: Evolving/moderate complexity  EVALUATION COMPLEXITY: Moderate   GOALS: Goals reviewed with patient? Yes  SHORT TERM GOALS: Target date: 07/17/2024     Be independent in initial HEP Baseline: consistent with HEP Goal status: MET  2.  Perform 5x sit to stand in < 16 seconds without UE  support to reduce falls risk Baseline: 20.44 Goal status: INITIAL  3.  Improve bil knee strength and stability to ascend 8 with use of 1 rail for safety in negotiating steps at high school football games Baseline: working on this and using walking stick (07/04/24) Goal status: IN PROGRESS  4.  Stand > or =  to 30 min without limitation due to LBP.   Baseline: 20 min max  Goal Status: INITIAL  LONG TERM GOALS: Target date: 08/14/2024    Be independent in advanced HEP Baseline:   Goal status: IN PROGRESS  2.  Perform 5x sit to stand in < or = to 13 seconds to reduce falls risk  Baseline: 20.44 seconds  Goal status: INITIAL  3.  Improve Modified Oswestry to < or = to 18% disability  Baseline: 28% disability  Goal status: INITIAL  4.  Stand for > or = to 45 min without limitation due to LBP Baseline: 20 min max  Goal status: INITIAL  5.  Improve LE strength to squat to feed his cat and return to standing with 50% increased ease  Baseline: pain and  less difficulty getting up (07/06/24) Goal status: IN PROGRESS PLAN:  PT FREQUENCY: 2x/week  PT DURATION: 8 weeks  PLANNED INTERVENTIONS: Therapeutic exercises, Therapeutic activity, Neuromuscular re-education, Balance training, Gait training, Patient/Family education, Self Care, Joint mobilization, Stair training, Vestibular training, Aquatic Therapy, Dry Needling, Cryotherapy, Moist heat, Manual therapy, and Re-evaluation  PLAN FOR NEXT SESSION: Quad and glute strength, balance tasks, hip and knee flexibility, functional mobility, work on steps for football stadium.    Burnard Joy, PT 07/06/24 11:05 AM   [facility] [address], [address] [place], [place] [address] Phone # [phone] Fax [phone]

## 2024-07-07 ENCOUNTER — Other Ambulatory Visit: Payer: Self-pay

## 2024-07-08 ENCOUNTER — Other Ambulatory Visit (HOSPITAL_COMMUNITY): Payer: Self-pay

## 2024-07-10 ENCOUNTER — Other Ambulatory Visit (HOSPITAL_COMMUNITY): Payer: Self-pay

## 2024-07-10 DIAGNOSIS — I440 Atrioventricular block, first degree: Secondary | ICD-10-CM | POA: Diagnosis not present

## 2024-07-10 DIAGNOSIS — R6882 Decreased libido: Secondary | ICD-10-CM | POA: Diagnosis not present

## 2024-07-10 DIAGNOSIS — E785 Hyperlipidemia, unspecified: Secondary | ICD-10-CM | POA: Diagnosis not present

## 2024-07-10 DIAGNOSIS — I10 Essential (primary) hypertension: Secondary | ICD-10-CM | POA: Diagnosis not present

## 2024-07-10 DIAGNOSIS — Z713 Dietary counseling and surveillance: Secondary | ICD-10-CM | POA: Diagnosis not present

## 2024-07-10 DIAGNOSIS — R600 Localized edema: Secondary | ICD-10-CM | POA: Diagnosis not present

## 2024-07-10 DIAGNOSIS — Z23 Encounter for immunization: Secondary | ICD-10-CM | POA: Diagnosis not present

## 2024-07-10 MED ORDER — SPIKEVAX 50 MCG/0.5ML IM SUSY: PREFILLED_SYRINGE | INTRAMUSCULAR | 0 refills | Status: AC

## 2024-07-11 ENCOUNTER — Ambulatory Visit

## 2024-07-11 ENCOUNTER — Other Ambulatory Visit (HOSPITAL_COMMUNITY): Payer: Self-pay

## 2024-07-11 DIAGNOSIS — M6281 Muscle weakness (generalized): Secondary | ICD-10-CM

## 2024-07-11 DIAGNOSIS — G8929 Other chronic pain: Secondary | ICD-10-CM

## 2024-07-11 DIAGNOSIS — M25562 Pain in left knee: Secondary | ICD-10-CM | POA: Diagnosis not present

## 2024-07-11 DIAGNOSIS — M5459 Other low back pain: Secondary | ICD-10-CM | POA: Diagnosis not present

## 2024-07-11 DIAGNOSIS — R2689 Other abnormalities of gait and mobility: Secondary | ICD-10-CM

## 2024-07-11 DIAGNOSIS — M25561 Pain in right knee: Secondary | ICD-10-CM | POA: Diagnosis not present

## 2024-07-11 DIAGNOSIS — R252 Cramp and spasm: Secondary | ICD-10-CM

## 2024-07-11 NOTE — Progress Notes (Unsigned)
 [facility] [address] [place] [address] Phone: [phone] Subjective:   ISusannah Gully, am serving as a scribe for Dr. Arthea Claudene.  I'm seeing this patient by the request  of:  Shayne Anes, MD  CC: Bilateral knee pain  YEP:Dlagzrupcz  12/14/2023 Patient given PRP injection today. Post PRP handout given patient knows we are here if he needs anything actually with him having his ailing wife   Updated 07/12/2024 Cory [facility]ett is a 78 y.o. male coming in with complaint of B knee pain. Having a cough. Wondering about a xray. Back is okay and L hip have been bothersome.       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   IFG (impaired fasting glucose) 01/2016   Pre-diabetes    RBBB (right bundle branch block)    Rheumatic fever    age 47 or 40, in hospital for a month, out of school for a yr   Seasonal allergies    Squamous cell carcinoma    Testicular hypofunction    Past Surgical History:  Procedure Laterality Date   EYE SURGERY     right eye  cataract surgery with lens implant   NASAL SEPTUM SURGERY     NOSE SURGERY     REVERSE SHOULDER ARTHROPLASTY Right 01/11/2020   Procedure: REVERSE SHOULDER ARTHROPLASTY;  Surgeon: Dozier Soulier, MD;  Location: [facility];  Service: Orthopedics;  Laterality: Right;   SHOULDER ARTHROSCOPY WITH SUBACROMIAL DECOMPRESSION Right 08/07/2019   Procedure: SHOULDER ARTHROSCOPY WITH SUBACROMIAL DECOMPRESSION;  Surgeon: Dozier Soulier, MD;  Location: [facility];  Service: Orthopedics;  Laterality: Right;   SKIN CANCER EXCISION     basal and squamous cell    TONSILLECTOMY     VASECTOMY     WISDOM TOOTH EXTRACTION     Social History   Socioeconomic History   Marital status:  Widowed    Spouse name: Not on file   Number of children: 3   Years of education: Not on file   Highest education level: Not on file  Occupational History    Comment: Retired  Tobacco Use   Smoking status: Former    Current packs/day: 0.00    Types: Cigarettes    Quit date: 12/14/1969    Years since quitting: 54.6   Smokeless tobacco: Never  Vaping Use   Vaping status: Never Used  Substance and Sexual Activity   Alcohol  use: Yes    Alcohol /week: 2.0 standard drinks of alcohol     Types: 2 Cans of beer per week    Comment: social   Drug use: Never   Sexual activity: Not on file  Other Topics Concern   Not on file  Social History Narrative   Not on file   Social Drivers of Health   Financial Resource Strain: Not on file  Food Insecurity: Not on file  Transportation Needs: Not on file  Physical Activity: Not on file  Stress: Not on file  Social Connections: Unknown (03/17/2022)   Received from [facility]   Social Network    Social Network: Not on file   Allergies  Allergen Reactions   Bee Venom Anaphylaxis  Moxifloxacin Shortness Of Breath   Family History  Problem Relation Age of Onset   Stroke Mother    Arthritis Mother    Cancer Mother        unknown    Dementia Mother    Arthritis Father    Mesothelioma Father    Arthritis Sister    Arthritis Sister    Bipolar disorder Sister      Current Outpatient Medications (Cardiovascular):    ezetimibe  (ZETIA ) 10 MG tablet, Take 1 tablet (10 mg total) by mouth daily.   hydrochlorothiazide  (HYDRODIURIL ) 25 MG tablet, Take 1 tablet (25 mg total) by mouth in the morning.   losartan  (COZAAR ) 50 MG tablet, Take 1 tablet (50 mg total) by mouth at bedtime.   rosuvastatin  (CRESTOR ) 10 MG tablet, TAKE 1 TABLET BY MOUTH EVERY DAY   rosuvastatin  (CRESTOR ) 20 MG tablet, Take 1 tablet (20 mg total) by mouth daily.   sildenafil  (REVATIO ) 20 MG tablet, Take 1-5 tablets by mouth as needed 30 minutes-4 hours prior to sexual  activity as directed.   tadalafil  (CIALIS ) 5 MG tablet, Take 1 tablet (5 mg total) by mouth daily.   tadalafil  (CIALIS ) 5 MG tablet, Take 1 tablet (5 mg total) by mouth daily.   tadalafil  (CIALIS ) 5 MG tablet, Take 1 tablet (5 mg total) by mouth daily.  Current Outpatient Medications (Respiratory):    umeclidinium bromide  (INCRUSE ELLIPTA ) 62.5 MCG/ACT AEPB, Inhale 1 puff into the lungs daily (rinse mouth with water  after use)  Current Outpatient Medications (Analgesics):    aspirin  EC 81 MG tablet, Take 1 tablet (81 mg total) by mouth daily. Swallow whole.   Current Outpatient Medications (Other):    diazepam  (VALIUM ) 5 MG tablet, One tab by mouth, 2 hours before procedure.   amoxicillin  (AMOXIL ) 500 MG capsule, Take 4 capsules by mouth 1 hour prior to dental treatment   amoxicillin -clavulanate (AUGMENTIN ) 875-125 MG tablet, Take 1 tablet by mouth 2 (two) times daily  with food for 10 days   Barberry-Oreg Grape-Goldenseal (BERBERINE COMPLEX PO), Take by mouth.   cefdinir  (OMNICEF ) 300 MG capsule, Take 1 capsule (300 mg total) by mouth 2 (two) times daily with food.   cephALEXin  (KEFLEX ) 500 MG capsule, Take 4 capsules (2,000 mg total) by mouth one hour prior to surgery   CHELATED MAGNESIUM PO, Take 2 tablets by mouth in the morning and at bedtime.    Cholecalciferol (VITAMIN D3) 1.25 MG (50000 UT) CAPS, Take by mouth.   ciprofloxacin -dexamethasone  (CIPRODEX ) OTIC suspension, Place 4 drops into the right ear 2 (two) times daily for 3 days   Coenzyme Q10 (CO Q 10) 100 MG CAPS, Take 100 mg by mouth in the morning and at bedtime.    COVID-19 mRNA vaccine (SPIKEVAX ) syringe, Administer Covid vaccine x once Intramuscular as directed   escitalopram  (LEXAPRO ) 10 MG tablet, Take 1 tablet (10 mg total) by mouth daily.   Investigational omega-3-fatty acid/placebo capsule S0927*, Take 4 capsules by mouth 2 (two) times daily. Take with food.   Multiple Vitamin (MULTI-VITAMINS) TABS, Take 3 tablets by  mouth in the morning and at bedtime.    nirmatrelvir /ritonavir  EUA (PAXLOVID ) TABS, Take 3 tablets by mouth 2 (two) times daily.   ofloxacin  (OCUFLOX ) 0.3 % ophthalmic solution, Apply 4 drops in right ear twice daily for 5 days.   prednisoLONE  acetate (PRED FORTE ) 1 % ophthalmic suspension, Place 1 drop into the right eye 3 (three) times daily.   prednisoLONE  acetate (PRED FORTE ) 1 % ophthalmic  suspension, Place 1 drop into the right eye 3 times daily.   Probiotic Product (PROBIOTIC PO), Take 1 capsule by mouth daily.   Saw Palmetto, Serenoa repens, (SAW PALMETTO PO), Take 2 capsules by mouth in the morning and at bedtime. * These medications belong to multiple therapeutic classes and are listed under each applicable group.   Reviewed prior external information including notes and imaging from  primary care provider As well as notes that were available from care everywhere and other healthcare systems.  Past medical history, social, surgical and family history all reviewed in electronic medical record.  No pertanent information unless stated regarding to the chief complaint.   Review of Systems:  No headache, visual changes, nausea, vomiting, diarrhea, constipation, dizziness, abdominal pain, skin rash, fevers, chills, night sweats, weight loss, swollen lymph nodes, body aches, joint swelling, chest pain, shortness of breath, mood changes. POSITIVE muscle aches  Objective  Blood pressure (!) 142/80, pulse 69, height 6' 2 (1.88 m), weight 220 lb (99.8 kg), SpO2 97%.   General: No apparent distress alert and oriented x3 mood and affect normal, dressed appropriately.  HEENT: Pupils equal, extraocular movements intact  Respiratory: Patient's speak in full sentences and does not appear short of breath  Cardiovascular: No lower extremity edema, non tender, no erythema  Bilateral knees do have trace effusion noted.  Fairly significant left greater than right.  Lateral tracking of the patella  noted.  Positive patella grind test noted.  No instability of the patella noted but the rest of the knee seems to be minimally tender and with no significant varus or valgus deformity noted.  Low back does have positive straight leg test.  Less than 5 degrees of extension noted.  Worsening radicular symptoms down the posterior aspect of the leg with this.  Mild shuffling gait noted.  4-5 strength of dorsiflexion of the foot but symmetric.  After informed written and verbal consent, patient was seated on exam table. Right knee was prepped with alcohol  swab and utilizing anterolateral approach, patient's right knee space was injected with 4:1  marcaine  0.5%: Kenalog 40mg /dL. Patient tolerated the procedure well without immediate complications.  After informed written and verbal consent, patient was seated on exam table. Left knee was prepped with alcohol  swab and utilizing anterolateral approach, patient's left knee space was injected with 4:1  marcaine  0.5%: Kenalog 40mg /dL. Patient tolerated the procedure well without immediate complications.    Impression and Recommendations:     The above documentation has been reviewed and is accurate and complete Mayrani Khamis M Joseph Johns, DO

## 2024-07-11 NOTE — Therapy (Signed)
 OUTPATIENT PHYSICAL THERAPY TREATMENT   Patient Name: Cory Barnett MRN: [id number] DOB:[birth year], 77 y.o., male Today's Date: 07/11/2024  END OF SESSION:  PT End of Session - 07/11/24 1100     Visit Number 7    Date for PT Re-Evaluation 08/14/24    Authorization Type Medicare B    Progress Note Due on Visit 10    PT Start Time 1017    PT Stop Time 1100    PT Time Calculation (min) 43 min    Activity Tolerance Patient tolerated treatment well    Behavior During Therapy WFL for tasks assessed/performed                 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   IFG (impaired fasting glucose) 01/2016   Pre-diabetes    RBBB (right bundle branch block)    Rheumatic fever    age 32 or 57, in hospital for a month, out of school for a yr   Seasonal allergies    Squamous cell carcinoma    Testicular hypofunction    Past Surgical History:  Procedure Laterality Date   EYE SURGERY     right eye  cataract surgery with lens implant   NASAL SEPTUM SURGERY     NOSE SURGERY     REVERSE SHOULDER ARTHROPLASTY Right 01/11/2020   Procedure: REVERSE SHOULDER ARTHROPLASTY;  Surgeon: Dozier Soulier, MD;  Location: [facility];  Service: Orthopedics;  Laterality: Right;   SHOULDER ARTHROSCOPY WITH SUBACROMIAL DECOMPRESSION Right 08/07/2019   Procedure: SHOULDER ARTHROSCOPY WITH SUBACROMIAL DECOMPRESSION;  Surgeon: Dozier Soulier, MD;  Location: [facility];  Service: Orthopedics;  Laterality: Right;   SKIN CANCER EXCISION     basal and squamous cell    TONSILLECTOMY     VASECTOMY     WISDOM TOOTH EXTRACTION     Patient Active Problem List   Diagnosis Date Noted   Bursitis of both hips 02/23/2023   Degenerative arthritis of knee, bilateral 11/08/2022   Lumbar  radiculitis 08/05/2020   Left knee pain 08/05/2020   Piriformis syndrome of left side 08/18/2019   Rotator cuff tear, right 05/29/2019   Closed fracture of distal clavicle 05/15/2019   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 I've been doing more walking around my neighborhood.  My back is still feeling much better.    PERTINENT HISTORY: Anxiety, HTN, reverse shoulder replacement (2021), basal cell carcinoma  PAIN: 07/11/24 Are you having pain? Yes: NPRS scale: knee 0-10/10, back 0-2/10 with standing  Pain location: Lt>Rt knee , low back  Pain description:  knees (sharp), low back (dull ache) Aggravating factors: going up steps, standing long periods, squatting to feed cat  Relieving factors: sitting down, not climbing steps   PRECAUTIONS: Fall  WEIGHT BEARING RESTRICTIONS: No  FALLS:  Has patient fallen in last 6 months? No  LIVING ENVIRONMENT: Lives with: lives alone, recent widower Lives in: House/apartment Stairs: Yes: External: 3 steps; on right going up Has following equipment at home: None  OCCUPATION: retired  PLOF: Independent, Vocation/Vocational requirements: desk work, walking-retired but small amount of work, and Leisure: walking for exercises Pilates every week in a small group, works out with trainer 2x/wk. 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 No acute osseous abnormalities  Right knee:  Degenerative changes. No acute osseous abnormalities.   US  on 12/10/23:  Limited muscular skeletal ultrasound was performed and interpreted by CLAUDENE HUSSAR, M  Left knee does have significant decrease in the hypoechoic changes noted.  Does have narrowing of the patellofemoral joint bilaterally but worsening on the right side. Impression: Worsening effusion of the right knee compared to left   COGNITION: Overall cognitive status: Within functional limits for tasks assessed     SENSATION: WFL  MUSCLE LENGTH: Hamstring flexibility limited by 25% bilaterally  POSTURE: rounded shoulders and forward head  PALPATION: Diffuse palpable tenderness in bil quads, and with patellar mobs/compression   LOWER EXTREMITY ROM:  Knee extension limited   LOWER EXTREMITY MMT:  MMT Right eval Left eval  Hip flexion 4 4  Hip extension 4+ 4+  Hip abduction 4 4  Hip adduction    Hip internal rotation    Hip external rotation    Knee flexion 4+ 4+  Knee extension 4+ 4+  Ankle dorsiflexion 5 5  Ankle plantarflexion    Ankle inversion    Ankle eversion     (Blank rows = not tested)   FUNCTIONAL TESTS:  06/19/24: 5x sit to stand: 20.44 seconds   Modified Oswestry: 14/50=28%  GAIT: Distance walked: 100 Assistive device utilized: None Level of assistance: Complete Independence Comments: bil knee flexion, wide base of support Steps: negotiating with alternating pattern and use of hands for support, pain with uncontrolled descent with descending    TODAY'S TREATMENT:                                                                                                                              DATE:  07/11/24 NuStep: Level 6x 8 min- PT present to discuss progress Leg Press: seat 9, 70# x10,  80#  x20, 45# x20 Seated hamstring stretch 3x20 seconds  Long arc quads: 5 hold 2x10- added 2.5#  Standing hip abduction with TA activation 2x10 2.5# added standing on balance pad Farmer's carry: 5# KB 2 laps around both gyms each hand position Alternating step taps 6  with min UE support and 2.5# ankle weights  07/06/24 NuStep: Level 6x 8 min- PT present to discuss progress Seated hamstring stretch 3x20 seconds  Long arc quads: 5 hold 2x10- added 2#  Standing hip abduction with TA activation 2x10 2# added standing on balance pad Farmer's carry: 5# KB 2 laps around both gyms each hand position Pallof press: green band x20  Alternating step taps 6 with min UE support and 2# ankle weights   07/04/24 NuStep: Level 6x 8 min- PT present to discuss progress Seated hamstring stretch 3x20 seconds  Long arc quads: 5 hold 2x10- added 2#  Standing hip abduction with TA activation 2x10 2# added  Farmer's carry: 5# KB 2 laps around [facility] gym each hand position Pallof press: red band x20  Alternating step taps 8 with min UE support x20    PATIENT EDUCATION:  Education details: Access Code: [email or web address], use walking stick for community distances and football stadiums Person educated: Patient Education method: Programmer, multimedia, Facilities manager, and Handouts Education comprehension: verbalized understanding and returned demonstration  HOME EXERCISE PROGRAM: Access Code: [email or web address] URL: [email or web address] Date: 06/19/2024 Prepared by: Burnard  Exercises - Sit to Stand Without Arm Support  - 2 x daily - 7 x weekly - 2 sets - 5 reps - Seated Hamstring Stretch  - 3 x daily - 7 x weekly - 1 sets - 3 reps - 20 hold - Supine Lower Trunk Rotation  - 3 x daily - 7 x weekly - 1 sets - 3 reps - 20 hold - Hooklying Single Knee to Chest Stretch  - 3 x daily - 7 x weekly - 1 sets - 3 reps - 20 hold - Seated Figure 4 Piriformis Stretch  - 3 x daily - 7 x weekly - 1 sets - 3 reps - 20 hold - Heel Raises  with Counter Support  - 1 x daily - 7 x weekly - 3 sets - 10 reps  ASSESSMENT:  CLINICAL IMPRESSION: Pt continues to report Lt>Rt knee pain due to OA.  He is working on functional strength and stability.  He uses his walking stick for longer community distances for safety.  PT monitored for pain, technique and fatigue.  He did well with increased weight and return to leg press today.  Patient will benefit from skilled PT to address the below impairments and improve overall function.   OBJECTIVE IMPAIRMENTS: Abnormal gait, decreased activity tolerance, decreased balance, decreased endurance, difficulty walking, decreased strength, decreased safety awareness, increased muscle spasms, impaired flexibility, improper body mechanics, and pain.   ACTIVITY LIMITATIONS: carrying, lifting, standing, squatting, stairs, transfers, and locomotion level  PARTICIPATION LIMITATIONS: cleaning, laundry, community activity, and yard work  PERSONAL FACTORS: Age, Time since onset of injury/illness/exacerbation, and 1 comorbidity: OA are also affecting patient's functional outcome.   REHAB POTENTIAL: Good  CLINICAL DECISION MAKING: Evolving/moderate complexity  EVALUATION COMPLEXITY: Moderate   GOALS: Goals reviewed with patient? Yes  SHORT TERM GOALS: Target date: 07/17/2024     Be independent in initial HEP Baseline: consistent with HEP Goal status: MET  2.  Perform 5x sit to stand in < 16 seconds without UE  support to reduce falls risk Baseline: 20.44 Goal status: INITIAL  3.  Improve bil knee strength and stability to ascend 8 with use of 1 rail for safety in negotiating steps at high school football games Baseline: working on this and using walking stick (07/04/24) Goal status: IN PROGRESS  4.  Stand > or = to 30 min without  limitation due to LBP.   Baseline: 20 min max  Goal Status: INITIAL  LONG TERM GOALS: Target date: 08/14/2024    Be independent in advanced HEP Baseline:  Goal  status: IN PROGRESS  2.  Perform 5x sit to stand in < or = to 13 seconds to reduce falls risk  Baseline: 20.44 seconds  Goal status: INITIAL  3.  Improve Modified Oswestry to < or = to 18% disability  Baseline: 28% disability  Goal status: INITIAL  4.  Stand for > or = to 45 min without limitation due to LBP Baseline: 20 min max  Goal status: INITIAL  5.  Improve LE strength to squat to feed his cat and return to standing with 50% increased ease  Baseline: pain and  less difficulty getting up (07/06/24) Goal status: IN PROGRESS PLAN:  PT FREQUENCY: 2x/week  PT DURATION: 8 weeks  PLANNED INTERVENTIONS: Therapeutic exercises, Therapeutic activity, Neuromuscular re-education, Balance training, Gait training, Patient/Family education, Self Care, Joint mobilization, Stair training, Vestibular training, Aquatic Therapy, Dry Needling, Cryotherapy, Moist heat, Manual therapy, and Re-evaluation  PLAN FOR NEXT SESSION: Quad and glute strength, balance tasks, hip and knee flexibility, functional mobility, work on steps for football stadium.    Burnard Joy, PT 07/11/24 11:01 AM   [facility] [address] [place], [place] [address] Phone # [phone] Fax [phone]

## 2024-07-12 ENCOUNTER — Encounter: Payer: Self-pay | Admitting: Family Medicine

## 2024-07-12 ENCOUNTER — Other Ambulatory Visit (HOSPITAL_COMMUNITY): Payer: Self-pay

## 2024-07-12 ENCOUNTER — Ambulatory Visit (INDEPENDENT_AMBULATORY_CARE_PROVIDER_SITE_OTHER): Admitting: Family Medicine

## 2024-07-12 ENCOUNTER — Ambulatory Visit (INDEPENDENT_AMBULATORY_CARE_PROVIDER_SITE_OTHER)

## 2024-07-12 VITALS — BP 142/80 | HR 69 | Ht 74.0 in | Wt 220.0 lb

## 2024-07-12 DIAGNOSIS — M25552 Pain in left hip: Secondary | ICD-10-CM

## 2024-07-12 DIAGNOSIS — R053 Chronic cough: Secondary | ICD-10-CM | POA: Diagnosis not present

## 2024-07-12 DIAGNOSIS — R059 Cough, unspecified: Secondary | ICD-10-CM | POA: Diagnosis not present

## 2024-07-12 DIAGNOSIS — M170 Bilateral primary osteoarthritis of knee: Secondary | ICD-10-CM | POA: Diagnosis not present

## 2024-07-12 DIAGNOSIS — M5416 Radiculopathy, lumbar region: Secondary | ICD-10-CM | POA: Diagnosis not present

## 2024-07-12 DIAGNOSIS — M25551 Pain in right hip: Secondary | ICD-10-CM

## 2024-07-12 DIAGNOSIS — I700 Atherosclerosis of aorta: Secondary | ICD-10-CM | POA: Diagnosis not present

## 2024-07-12 DIAGNOSIS — M47816 Spondylosis without myelopathy or radiculopathy, lumbar region: Secondary | ICD-10-CM | POA: Diagnosis not present

## 2024-07-12 DIAGNOSIS — M8580 Other specified disorders of bone density and structure, unspecified site: Secondary | ICD-10-CM | POA: Diagnosis not present

## 2024-07-12 MED ORDER — DIAZEPAM 5 MG PO TABS
ORAL_TABLET | ORAL | 0 refills | Status: AC
  Filled 2024-07-12: qty 2, 1d supply, fill #0

## 2024-07-12 NOTE — Assessment & Plan Note (Signed)
 Worsening lumbar radiculopathy, patient's symptoms are consistent with more of a spinal stenosis.  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  Do feel an MRI would be highly appropriate at this point.  This is starting to affect daily activities, missing things with family like it is grandson's football games.  He would be a candidate for potential epidurals.  Patient will get an MRI but due to such claustrophobia we will send in Valium  and try an open MRI.  Follow-up with me again after imaging to discuss further.

## 2024-07-12 NOTE — Assessment & Plan Note (Addendum)
 Bilateral steroid injections given today, tolerated the procedure well.  Has failed all other conservative therapy for the patellofemoral arthritis and will be following up with a surgeon to discuss further management.Would not want to have any type of surgical intervention until January likely.  Follow-up with me again in 2 to 3 months otherwise.

## 2024-07-12 NOTE — Patient Instructions (Addendum)
 MRI at [facility] Good to see you! Injection in knees today Xray today See you again in 3 months if you need us 

## 2024-07-13 ENCOUNTER — Other Ambulatory Visit (HOSPITAL_COMMUNITY): Payer: Self-pay

## 2024-07-13 ENCOUNTER — Ambulatory Visit

## 2024-07-13 DIAGNOSIS — M5459 Other low back pain: Secondary | ICD-10-CM | POA: Diagnosis not present

## 2024-07-13 DIAGNOSIS — R2689 Other abnormalities of gait and mobility: Secondary | ICD-10-CM

## 2024-07-13 DIAGNOSIS — G8929 Other chronic pain: Secondary | ICD-10-CM | POA: Diagnosis not present

## 2024-07-13 DIAGNOSIS — M25562 Pain in left knee: Secondary | ICD-10-CM | POA: Diagnosis not present

## 2024-07-13 DIAGNOSIS — R252 Cramp and spasm: Secondary | ICD-10-CM

## 2024-07-13 DIAGNOSIS — M6281 Muscle weakness (generalized): Secondary | ICD-10-CM

## 2024-07-13 DIAGNOSIS — M25561 Pain in right knee: Secondary | ICD-10-CM | POA: Diagnosis not present

## 2024-07-13 NOTE — Therapy (Signed)
 OUTPATIENT PHYSICAL THERAPY TREATMENT   Patient Name: Cory Barnett MRN: [id number] DOB:19-Sep-1946, 78 y.o., male Today's Date: 07/13/2024  END OF SESSION:  PT End of Session - 07/13/24 1108     Visit Number 8    Date for PT Re-Evaluation 08/14/24    Authorization Type Medicare B    Progress Note Due on Visit 10    PT Start Time 1015    PT Stop Time 1102    PT Time Calculation (min) 47 min    Activity Tolerance Patient tolerated treatment well    Behavior During Therapy WFL for tasks assessed/performed                  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   IFG (impaired fasting glucose) 01/2016   Pre-diabetes    RBBB (right bundle branch block)    Rheumatic fever    age 30 or 38, in hospital for a month, out of school for a yr   Seasonal allergies    Squamous cell carcinoma    Testicular hypofunction    Past Surgical History:  Procedure Laterality Date   EYE SURGERY     right eye  cataract surgery with lens implant   NASAL SEPTUM SURGERY     NOSE SURGERY     REVERSE SHOULDER ARTHROPLASTY Right 01/11/2020   Procedure: REVERSE SHOULDER ARTHROPLASTY;  Surgeon: Dozier Soulier, MD;  Location: [facility];  Service: Orthopedics;  Laterality: Right;   SHOULDER ARTHROSCOPY WITH SUBACROMIAL DECOMPRESSION Right 08/07/2019   Procedure: SHOULDER ARTHROSCOPY WITH SUBACROMIAL DECOMPRESSION;  Surgeon: Dozier Soulier, MD;  Location: [facility];  Service: Orthopedics;  Laterality: Right;   SKIN CANCER EXCISION     basal and squamous cell    TONSILLECTOMY     VASECTOMY     WISDOM TOOTH EXTRACTION     Patient Active Problem List   Diagnosis Date Noted   Bursitis of both hips 02/23/2023   Degenerative arthritis of knee, bilateral 11/08/2022    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   Left knee pain 08/05/2020   Piriformis syndrome of left side 08/18/2019   Rotator cuff tear, right 05/29/2019   Closed fracture of distal clavicle 05/15/2019   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    PCP: Shayne Anes, MD   REFERRING PROVIDER: Claudene Hussar, MD  REFERRING DIAG:  Diagnosis  M17.0 (ICD-10-CM) - Primary osteoarthritis of both knees  M54.16 (ICD-10-CM) - Lumbar radiculitis    THERAPY DIAG:  Chronic pain of left knee  Muscle weakness (generalized)  Other abnormalities of gait and mobility  Chronic pain of right knee  Other low back pain  Cramp and spasm  Rationale for Evaluation and Treatment: Rehabilitation  ONSET DATE: chronic with recent falls   SUBJECTIVE:   SUBJECTIVE STATEMENT: I got injections in my knees yesterday.  I'm getting an MRI of my back soon.  Dr Claudene thinks the pain is coming from my back.    PERTINENT HISTORY: Anxiety, HTN, reverse shoulder replacement (2021), basal cell carcinoma  PAIN: 07/11/24 Are you having pain? Yes: NPRS scale: knee 0-10/10, back 0-2/10 with standing  Pain location: Lt>Rt knee , low back  Pain description: knees (sharp), low back (dull  ache) Aggravating factors: going up steps, standing long periods, squatting to feed cat  Relieving factors: sitting down, not climbing steps   PRECAUTIONS: Fall  WEIGHT BEARING RESTRICTIONS: No  FALLS:  Has patient fallen in last 6 months? No  LIVING ENVIRONMENT: Lives with: lives alone, recent widower Lives in: House/apartment Stairs: Yes: External: 3 steps; on right going up Has following equipment at home: None  OCCUPATION: retired  PLOF: Independent, Vocation/Vocational requirements: desk work, walking-retired but small amount of work, and Leisure: walking for exercises Pilates every week in a small group, works out with trainer 2x/wk. Goes to HS football games to watch grandsons  PATIENT GOALS: reduce pain, improve  balance   NEXT MD VISIT: 6 weeks   OBJECTIVE:   DIAGNOSTIC FINDINGS: X-ray: Left knee Degenerative changes. No acute osseous abnormalities  Right knee:  Degenerative changes. No acute osseous abnormalities.   US  on 12/10/23:  Limited muscular skeletal ultrasound was performed and interpreted by CLAUDENE HUSSAR, M  Left knee does have significant decrease in the hypoechoic changes noted.  Does have narrowing of the patellofemoral joint bilaterally but worsening on the right side. Impression: Worsening effusion of the right knee compared to left   COGNITION: Overall cognitive status: Within functional limits for tasks assessed     SENSATION: WFL  MUSCLE LENGTH: Hamstring flexibility limited by 25% bilaterally  POSTURE: rounded shoulders and forward head  PALPATION: Diffuse palpable tenderness in bil quads, and with patellar mobs/compression   LOWER EXTREMITY ROM:  Knee extension limited   LOWER EXTREMITY MMT:  MMT Right eval Left eval  Hip flexion 4 4  Hip extension 4+ 4+  Hip abduction 4 4  Hip adduction    Hip internal rotation    Hip external rotation    Knee flexion 4+ 4+  Knee extension 4+ 4+  Ankle dorsiflexion 5 5  Ankle plantarflexion    Ankle inversion    Ankle eversion     (Blank rows = not tested)   FUNCTIONAL TESTS:  06/19/24: 5x sit to stand: 20.44 seconds   Modified Oswestry: 14/50=28%   07/13/24: 5x sit to stand: 16.76 seconds  GAIT: Distance walked: 100 Assistive device utilized: None Level of assistance: Complete Independence Comments: bil knee flexion, wide base of support Steps: negotiating with alternating pattern and use of hands for support, pain with uncontrolled descent with descending    TODAY'S TREATMENT:                                                                                                                              DATE:  07/13/24 NuStep: Level 6x 8 min- PT present to discuss progress Leg Press: seat 9, 70# x10, 80#   x20, 45# x20 Seated hamstring stretch 3x20 seconds  Long arc quads: 5 hold 2x10- added 2.5#  Standing hip abduction with TA activation 3x10 2.5# added standing on balance pad Farmer's carry: 5# KB 2 laps around both gyms each hand position  Alternating step taps 6 with min UE support and 2.5# ankle weights   07/11/24 NuStep: Level 6x 8 min- PT present to discuss progress Leg Press: seat 9, 70# x10, 80#  x20, 45# x20 Seated hamstring stretch 3x20 seconds  Long arc quads: 5 hold 2x10- added 2.5#  Standing hip abduction with TA activation 2x10 2.5# added standing on balance pad Farmer's carry: 5# KB 2 laps around both gyms each hand position Alternating step taps 6 with min UE support and 2.5# ankle weights  07/06/24 NuStep: Level 6x 8 min- PT present to discuss progress Seated hamstring stretch 3x20 seconds  Long arc quads: 5 hold 2x10- added 2#  Standing hip abduction with TA activation 2x10 2# added standing on balance pad Farmer's carry: 5# KB 2 laps around both gyms each hand position Pallof press: green band x20  Alternating step taps 6 with min UE support and 2# ankle weights    PATIENT EDUCATION:  Education details: Access Code: [email or web address], use walking stick for community distances and football stadiums Person educated: Patient Education method: Programmer, multimedia, Facilities manager, and Handouts Education comprehension: verbalized understanding and returned demonstration  HOME EXERCISE PROGRAM: Access Code: [email or web address] URL: [email or web address] Date: 07/13/2024 Prepared by: Burnard  Exercises - Seated Hamstring Stretch  - 3 x daily - 7 x weekly - 1 sets - 3 reps - 20 hold - Supine Lower Trunk Rotation  - 3 x daily - 7 x weekly - 1 sets - 3 reps - 20 hold - Hooklying Single Knee to Chest Stretch  - 3 x daily - 7 x weekly - 1 sets - 3 reps - 20 hold - Seated Figure 4 Piriformis Stretch  - 3 x daily - 7 x weekly - 1 sets - 3 reps - 20 hold - Heel Raises with Counter  Support  - 1 x daily - 7 x weekly - 3 sets - 10 reps - Clamshell  - 1 x daily - 7 x weekly - 2 sets - 10 reps - Standing Hip Abduction with Counter Support  - 1 x daily - 7 x weekly - 1-2 sets - 10 reps - Seated Long Arc Quad  - 2 x daily - 7 x weekly - 2 sets - 10 reps - 5 hold - Seated Sciatic Nerve Glide With Cervical Motion  - 2-3 x daily - 7 x weekly - 1 sets - 10 reps  ASSESSMENT:  CLINICAL IMPRESSION: Pt continues to report Lt>Rt knee pain due to OA.  This pain is improved after cortisone injections yesterday.   MD suspects lumbar radiculopathy and he will have MRI of lumbar spine. PT added sciatic nerve glides and issued handout.  He is working on functional strength and stability and is making improvements with 5x sit to stand reduced time.  He uses his walking stick for longer community distances for safety. He is walking regularly at home in the evenings x 45 minutes.   PT monitored for pain, technique and fatigue.   Patient will benefit from skilled PT to address the below impairments and improve overall function.   OBJECTIVE IMPAIRMENTS: Abnormal gait, decreased activity tolerance, decreased balance, decreased endurance, difficulty walking, decreased strength, decreased safety awareness, increased muscle spasms, impaired flexibility, improper body mechanics, and pain.   ACTIVITY LIMITATIONS: carrying, lifting, standing, squatting, stairs, transfers, and locomotion level  PARTICIPATION LIMITATIONS: cleaning, laundry, community activity, and yard work  PERSONAL FACTORS: Age, Time since onset of injury/illness/exacerbation, and 1 comorbidity: OA are also affecting patient's functional outcome.  REHAB POTENTIAL: Good  CLINICAL DECISION MAKING: Evolving/moderate complexity  EVALUATION COMPLEXITY: Moderate   GOALS: Goals reviewed with patient? Yes  SHORT TERM GOALS: Target date: 07/17/2024     Be independent in initial HEP Baseline: consistent with HEP Goal status: MET  2.   Perform 5x sit to stand in < 16 seconds without UE  support to reduce falls risk Baseline: 16.76 (07/13/24) Goal status: IN PROGRESS  3.  Improve bil knee strength and stability to ascend 8 with use of 1 rail for safety in negotiating steps at high school football games Baseline: working on this and using walking stick (07/04/24) Goal status: IN PROGRESS  4.  Stand > or = to 30 min without limitation due to LBP.   Baseline: 20 min max  Goal Status: INITIAL  LONG TERM GOALS: Target date: 08/14/2024    Be independent in advanced HEP Baseline:  Goal status: IN PROGRESS  2.  Perform 5x sit to stand in < or = to 13 seconds to reduce falls risk  Baseline: 20.44 seconds  Goal status: INITIAL  3.  Improve Modified Oswestry to < or = to 18% disability  Baseline: 28% disability  Goal status: INITIAL  4.  Stand for > or = to 45 min without limitation due to LBP Baseline: 20 min max  Goal status: INITIAL  5.  Improve LE strength to squat to feed his cat and return to standing with 50% increased ease  Baseline: pain and  less difficulty getting up (07/06/24) Goal status: IN PROGRESS PLAN:  PT FREQUENCY: 2x/week  PT DURATION: 8 weeks  PLANNED INTERVENTIONS: Therapeutic exercises, Therapeutic activity, Neuromuscular re-education, Balance training, Gait training, Patient/Family education, Self Care, Joint mobilization, Stair training, Vestibular training, Aquatic Therapy, Dry Needling, Cryotherapy, Moist heat, Manual therapy, and Re-evaluation  PLAN FOR NEXT SESSION: Quad and glute strength, balance tasks, hip and knee flexibility, functional mobility, work on steps for football stadium.  Review nerve glides, standing rotation to work toward playing golf   Burnard Joy, PT 07/13/24 11:10 AM   [facility] [address] [place], [place] [address] Phone # [phone] Fax [phone]

## 2024-07-14 ENCOUNTER — Other Ambulatory Visit (HOSPITAL_COMMUNITY): Payer: Self-pay

## 2024-07-18 ENCOUNTER — Encounter

## 2024-07-20 ENCOUNTER — Ambulatory Visit: Payer: Self-pay | Admitting: Family Medicine

## 2024-07-20 ENCOUNTER — Ambulatory Visit

## 2024-07-20 DIAGNOSIS — G8929 Other chronic pain: Secondary | ICD-10-CM

## 2024-07-20 DIAGNOSIS — R2689 Other abnormalities of gait and mobility: Secondary | ICD-10-CM

## 2024-07-20 DIAGNOSIS — M6281 Muscle weakness (generalized): Secondary | ICD-10-CM

## 2024-07-20 DIAGNOSIS — M5459 Other low back pain: Secondary | ICD-10-CM | POA: Diagnosis not present

## 2024-07-20 DIAGNOSIS — R252 Cramp and spasm: Secondary | ICD-10-CM

## 2024-07-20 DIAGNOSIS — M25561 Pain in right knee: Secondary | ICD-10-CM | POA: Diagnosis not present

## 2024-07-20 DIAGNOSIS — M25562 Pain in left knee: Secondary | ICD-10-CM | POA: Diagnosis not present

## 2024-07-20 NOTE — Therapy (Signed)
 OUTPATIENT PHYSICAL THERAPY TREATMENT   Patient Name: Cory Barnett MRN: [id number] DOB:May 06, 1946, 78 y.o., male Today's Date: 07/20/2024  END OF SESSION:  PT End of Session - 07/20/24 1146     Visit Number 9    Date for Recertification  08/14/24    Authorization Type Medicare B    Progress Note Due on Visit 10    PT Start Time 1102    PT Stop Time 1146    PT Time Calculation (min) 44 min    Activity Tolerance Patient tolerated treatment well    Behavior During Therapy WFL for tasks assessed/performed                   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   IFG (impaired fasting glucose) 01/2016   Pre-diabetes    RBBB (right bundle branch block)    Rheumatic fever    age 13 or 89, in hospital for a month, out of school for a yr   Seasonal allergies    Squamous cell carcinoma    Testicular hypofunction    Past Surgical History:  Procedure Laterality Date   EYE SURGERY     right eye  cataract surgery with lens implant   NASAL SEPTUM SURGERY     NOSE SURGERY     REVERSE SHOULDER ARTHROPLASTY Right 01/11/2020   Procedure: REVERSE SHOULDER ARTHROPLASTY;  Surgeon: Dozier Soulier, MD;  Location: [facility];  Service: Orthopedics;  Laterality: Right;   SHOULDER ARTHROSCOPY WITH SUBACROMIAL DECOMPRESSION Right 08/07/2019   Procedure: SHOULDER ARTHROSCOPY WITH SUBACROMIAL DECOMPRESSION;  Surgeon: Dozier Soulier, MD;  Location: [facility];  Service: Orthopedics;  Laterality: Right;   SKIN CANCER EXCISION     basal and squamous cell    TONSILLECTOMY     VASECTOMY     WISDOM TOOTH EXTRACTION     Patient Active Problem List   Diagnosis Date Noted   Bursitis of both hips 02/23/2023   Degenerative arthritis of knee, bilateral 11/08/2022    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   Left knee pain 08/05/2020   Piriformis syndrome of left side 08/18/2019   Rotator cuff tear, right 05/29/2019   Closed fracture of distal clavicle 05/15/2019   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  The injections worked for a short time and now they are back to feeling the same.     PERTINENT HISTORY: Anxiety, HTN, reverse shoulder replacement (2021), basal cell carcinoma  PAIN: 07/20/24 Are you having pain? Yes: NPRS scale: knee 0-10/10, back 0-2/10 with standing  Pain location: Lt>Rt knee , low back  Pain description: knees (sharp), low back (dull ache) Aggravating  factors: going up steps, standing long periods, squatting to feed cat  Relieving factors: sitting down, not climbing steps   PRECAUTIONS: Fall  WEIGHT BEARING RESTRICTIONS: No  FALLS:  Has patient fallen in last 6 months? No  LIVING ENVIRONMENT: Lives with: lives alone, recent widower Lives in: House/apartment Stairs: Yes: External: 3 steps; on right going up Has following equipment at home: None  OCCUPATION: retired  PLOF: Independent, Vocation/Vocational requirements: desk work, walking-retired but small amount of work, and Leisure: walking for exercises Pilates every week in a small group, works out with trainer 2x/wk. Goes to HS football games to watch grandsons  PATIENT GOALS: reduce pain, improve balance   NEXT  MD VISIT: 6 weeks   OBJECTIVE:   DIAGNOSTIC FINDINGS: X-ray: Left knee Degenerative changes. No acute osseous abnormalities  Right knee:  Degenerative changes. No acute osseous abnormalities.   US  on 12/10/23:  Limited muscular skeletal ultrasound was performed and interpreted by CLAUDENE HUSSAR, M  Left knee does have significant decrease in the hypoechoic changes noted.  Does have narrowing of the patellofemoral joint bilaterally but worsening on the right side. Impression: Worsening effusion of the right knee compared to left  07/12/24: X-ray of hip/pelvis:  IMPRESSION: 1. No evidence of fracture or dislocation. 2. 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 3. 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  COGNITION: Overall cognitive status: Within functional limits for tasks assessed     SENSATION: WFL  MUSCLE LENGTH: Hamstring flexibility limited by 25% bilaterally  POSTURE: rounded shoulders and forward head  PALPATION: Diffuse palpable tenderness in bil quads, and with patellar mobs/compression   LOWER EXTREMITY ROM:  Knee extension limited   LOWER EXTREMITY MMT:  MMT Right eval Left eval  Hip flexion 4 4  Hip extension 4+ 4+  Hip abduction 4 4  Hip adduction    Hip internal rotation    Hip external rotation    Knee flexion 4+ 4+  Knee extension 4+ 4+  Ankle dorsiflexion 5 5  Ankle plantarflexion    Ankle inversion    Ankle eversion     (Blank rows = not tested)   FUNCTIONAL TESTS:  06/19/24: 5x sit to stand: 20.44 seconds   Modified Oswestry: 14/50=28%   07/13/24: 5x sit to stand: 16.76 seconds  GAIT: Distance walked: 100 Assistive device utilized: None Level of assistance: Complete Independence Comments: bil knee flexion, wide base of support Steps: negotiating with alternating pattern and use of hands for support, pain with uncontrolled descent with descending    TODAY'S TREATMENT:                                                                                                                               DATE:   07/20/24 NuStep: Level 6x 8 min- PT present to discuss progress Leg Press: seat 9, 90#  x20, 45# x20 Seated hamstring stretch 3x20 seconds  Long arc quads: 5 hold 2x10- added 3#  Trunk rotation: red band anchored in door 2x10 bil each  Standing hip abduction with TA activation 3x10 3# added standing on balance pad Farmer's carry: 5# KB 2 laps around both gyms each hand position Alternating step taps 6 with min UE support and 2.5# ankle weights    07/13/24 NuStep: Level 6x 8 min- PT present to discuss progress Leg Press: seat 9, 70# x10, 80#  x20, 45# x20 Seated hamstring stretch 3x20 seconds  Long arc quads: 5 hold 2x10- added 2.5#  Standing hip abduction with TA activation 3x10 2.5# added standing on balance pad Farmer's carry: 5# KB 2 laps around both gyms each hand position Alternating step taps 6 with min UE support and 2.5# ankle weights   07/11/24 NuStep: Level 6x 8 min- PT present to discuss progress Leg Press: seat 9, 70# x10, 80#  x20, 45# x20 Seated hamstring stretch 3x20 seconds  Long arc quads: 5 hold 2x10- added 2.5#  Standing hip abduction with TA activation 2x10 2.5# added standing on balance pad Farmer's carry: 5# KB 2 laps around both gyms each hand position Alternating step taps 6 with min UE support and 2.5# ankle weights   PATIENT EDUCATION:  Education details: Access Code: [email or web address], use walking stick for community distances and football stadiums Person educated: Patient Education method: Programmer, multimedia, Facilities manager, and Handouts Education comprehension: verbalized understanding and returned demonstration  HOME EXERCISE PROGRAM: Access Code: [email or web address] URL: [email or web address] Date: 07/13/2024 Prepared by: Burnard  Exercises - Seated Hamstring Stretch  - 3 x daily - 7 x weekly - 1 sets - 3 reps - 20 hold - Supine Lower Trunk Rotation  - 3 x daily - 7 x weekly - 1  sets - 3 reps - 20 hold - Hooklying Single Knee to Chest Stretch  - 3 x daily - 7 x weekly - 1 sets - 3 reps - 20 hold - Seated Figure 4 Piriformis Stretch  - 3 x daily - 7 x weekly - 1 sets - 3 reps - 20 hold - Heel Raises with Counter Support  - 1 x daily - 7 x weekly - 3 sets - 10 reps - Clamshell  - 1 x daily - 7 x weekly - 2 sets - 10 reps - Standing Hip Abduction with Counter Support  - 1 x daily - 7 x weekly - 1-2 sets - 10 reps - Seated Long Arc Quad  - 2 x daily - 7 x weekly - 2 sets - 10 reps - 5 hold - Seated Sciatic Nerve Glide With Cervical Motion  - 2-3 x daily - 7 x weekly - 1 sets - 10 reps  ASSESSMENT:  CLINICAL IMPRESSION: Pt has been compliant with HEP while he has been traveling.  No longer having back pain.  He is regularly walking 45 min in the evening and reports good balance and endurance with this.  He was able to advance weight for hip and knee strength.   PT monitored for pain, technique and fatigue.   Patient will benefit from skilled PT to address the below impairments and improve overall function.   OBJECTIVE IMPAIRMENTS: Abnormal gait, decreased activity tolerance, decreased balance, decreased endurance, difficulty walking, decreased strength, decreased safety awareness, increased muscle spasms, impaired flexibility, improper body mechanics, and pain.   ACTIVITY LIMITATIONS: carrying, lifting, standing, squatting, stairs, transfers, and locomotion level  PARTICIPATION LIMITATIONS: cleaning, laundry, community activity, and yard work  PERSONAL FACTORS: Age, Time since onset of injury/illness/exacerbation, and 1 comorbidity: OA are also affecting  patient's functional outcome.   REHAB POTENTIAL: Good  CLINICAL DECISION MAKING: Evolving/moderate complexity  EVALUATION COMPLEXITY: Moderate   GOALS: Goals reviewed with patient? Yes  SHORT TERM GOALS: Target date: 07/17/2024     Be independent in initial HEP Baseline: consistent with HEP Goal status:  MET  2.  Perform 5x sit to stand in < 16 seconds without UE  support to reduce falls risk Baseline: 16.76 (07/13/24) Goal status: IN PROGRESS  3.  Improve bil knee strength and stability to ascend 8 with use of 1 rail for safety in negotiating steps at high school football games Baseline: working on this and using walking stick (07/04/24) Goal status: IN PROGRESS  4.  Stand > or = to 30 min without limitation due to LBP.   Baseline: 20 min max  Goal Status: INITIAL  LONG TERM GOALS: Target date: 08/14/2024    Be independent in advanced HEP Baseline:  Goal status: IN PROGRESS  2.  Perform 5x sit to stand in < or = to 13 seconds to reduce falls risk  Baseline: 16.76 seconds  Goal status: INITIAL  3.  Improve Modified Oswestry to < or = to 18% disability  Baseline: 28% disability  Goal status: INITIAL  4.  Stand for > or = to 45 min without limitation due to LBP Baseline: 20 min max  Goal status: INITIAL  5.  Improve LE strength to squat to feed his cat and return to standing with 50% increased ease  Baseline: pain and  less difficulty getting up (07/06/24) Goal status: IN PROGRESS PLAN:  PT FREQUENCY: 2x/week  PT DURATION: 8 weeks  PLANNED INTERVENTIONS: Therapeutic exercises, Therapeutic activity, Neuromuscular re-education, Balance training, Gait training, Patient/Family education, Self Care, Joint mobilization, Stair training, Vestibular training, Aquatic Therapy, Dry Needling, Cryotherapy, Moist heat, Manual therapy, and Re-evaluation  PLAN FOR NEXT SESSION: Quad and glute strength, balance tasks, hip and knee flexibility, functional mobility, work on steps for football stadium.  Review nerve glides, standing rotation to work toward playing golf.  10th visit next   Burnard Joy, PT 07/20/24 11:52 AM   [facility] [address], [address] [place], [place] [address] Phone # [phone] Fax [phone]

## 2024-07-21 ENCOUNTER — Other Ambulatory Visit (HOSPITAL_COMMUNITY): Payer: Self-pay

## 2024-07-24 ENCOUNTER — Ambulatory Visit: Admitting: Family Medicine

## 2024-07-25 ENCOUNTER — Ambulatory Visit

## 2024-07-25 DIAGNOSIS — G8929 Other chronic pain: Secondary | ICD-10-CM | POA: Diagnosis not present

## 2024-07-25 DIAGNOSIS — R2689 Other abnormalities of gait and mobility: Secondary | ICD-10-CM

## 2024-07-25 DIAGNOSIS — M25562 Pain in left knee: Secondary | ICD-10-CM | POA: Diagnosis not present

## 2024-07-25 DIAGNOSIS — M5459 Other low back pain: Secondary | ICD-10-CM | POA: Diagnosis not present

## 2024-07-25 DIAGNOSIS — M6281 Muscle weakness (generalized): Secondary | ICD-10-CM

## 2024-07-25 DIAGNOSIS — M25561 Pain in right knee: Secondary | ICD-10-CM | POA: Diagnosis not present

## 2024-07-25 NOTE — Therapy (Signed)
 OUTPATIENT PHYSICAL THERAPY TREATMENT   Patient Name: Cory Barnett MRN: [id number] DOB:[birth year], 78 y.o., male Today's Date: 07/25/2024 Progress Note Reporting Period 06/19/24 to 07/25/24  See note below for Objective Data and Assessment of Progress/Goals.     END OF SESSION:  PT End of Session - 07/25/24 1057     Visit Number 10    Date for Recertification  08/14/24    Authorization Type Medicare B    Progress Note Due on Visit 20    PT Start Time 1016    PT Stop Time 1100    PT Time Calculation (min) 44 min    Activity Tolerance Patient tolerated treatment well    Behavior During Therapy WFL for tasks assessed/performed                    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   IFG (impaired fasting glucose) 01/2016   Pre-diabetes    RBBB (right bundle branch block)    Rheumatic fever    age 77 or 89, in hospital for a month, out of school for a yr   Seasonal allergies    Squamous cell carcinoma    Testicular hypofunction    Past Surgical History:  Procedure Laterality Date   EYE SURGERY     right eye  cataract surgery with lens implant   NASAL SEPTUM SURGERY     NOSE SURGERY     REVERSE SHOULDER ARTHROPLASTY Right 01/11/2020   Procedure: REVERSE SHOULDER ARTHROPLASTY;  Surgeon: Dozier Soulier, MD;  Location: [facility];  Service: Orthopedics;  Laterality: Right;   SHOULDER ARTHROSCOPY WITH SUBACROMIAL DECOMPRESSION Right 08/07/2019   Procedure: SHOULDER ARTHROSCOPY WITH SUBACROMIAL DECOMPRESSION;  Surgeon: Dozier Soulier, MD;  Location: [facility];  Service: Orthopedics;  Laterality: Right;   SKIN CANCER EXCISION     basal and squamous cell    TONSILLECTOMY     VASECTOMY     WISDOM TOOTH EXTRACTION     Patient Active  Problem List   Diagnosis Date Noted   Bursitis of both hips 02/23/2023   Degenerative arthritis of knee, bilateral 11/08/2022   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   Left knee pain 08/05/2020   Piriformis syndrome of left side 08/18/2019   Rotator cuff tear, right 05/29/2019   Closed fracture of distal clavicle 05/15/2019   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    PCP: Shayne Anes, MD   REFERRING PROVIDER: Claudene Hussar, MD  REFERRING DIAG:  Diagnosis  M17.0 (ICD-10-CM) - Primary osteoarthritis of both knees  M54.16 (ICD-10-CM) - Lumbar radiculitis    THERAPY DIAG:  Chronic pain of left knee  Muscle weakness (generalized)  Other abnormalities of gait and mobility  Chronic pain of right knee  Other low back pain  Rationale for Evaluation and Treatment: Rehabilitation  ONSET DATE: chronic with recent falls   SUBJECTIVE:   SUBJECTIVE STATEMENT: Knees doing better.  I'm walking more.  Knees feel 30% better after injections.  I'll see orthopedic MD tomorrow.    PERTINENT HISTORY: Anxiety, HTN, reverse shoulder replacement (2021), basal cell carcinoma  PAIN: 07/25/24 Are you having pain? Yes: NPRS scale: knee 0-10/10, back 0-2/10 with standing  Pain location: Lt>Rt  knee , low back  Pain description: knees (sharp), low back (dull ache) Aggravating factors: going up steps, standing long periods, squatting to feed cat  Relieving factors: sitting down, not climbing steps   PRECAUTIONS: Fall  WEIGHT BEARING RESTRICTIONS: No  FALLS:  Has patient fallen in last 6 months? No  LIVING ENVIRONMENT: Lives with: lives alone, recent widower Lives in: House/apartment Stairs: Yes: External: 3 steps; on right going up Has following equipment at home: None  OCCUPATION: retired  PLOF: Independent, Vocation/Vocational requirements: desk work, walking-retired but small amount of work, and Leisure: walking for exercises Pilates every week in a small group, works out with  trainer 2x/wk. 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 No acute osseous abnormalities  Right knee:  Degenerative changes. No acute osseous abnormalities.   US  on 12/10/23:  Limited muscular skeletal ultrasound was performed and interpreted by CLAUDENE HUSSAR, M  Left knee does have significant decrease in the hypoechoic changes noted.  Does have narrowing of the patellofemoral joint bilaterally but worsening on the right side. Impression: Worsening effusion of the right knee compared to left  07/12/24: X-ray of hip/pelvis:  IMPRESSION: 1. No evidence of fracture or dislocation. 2. 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 3. 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  COGNITION: Overall cognitive status: Within functional limits for tasks assessed     SENSATION: WFL  MUSCLE LENGTH: Hamstring flexibility limited by 25% bilaterally  POSTURE: rounded shoulders and forward head  PALPATION: Diffuse palpable tenderness in bil quads, and with patellar mobs/compression   LOWER EXTREMITY ROM:  Knee extension limited   LOWER EXTREMITY MMT:  MMT Right eval Left eval  Hip flexion 4 4  Hip extension 4+ 4+  Hip abduction 4 4  Hip adduction    Hip internal rotation    Hip external rotation    Knee flexion 4+ 4+  Knee extension 4+ 4+  Ankle dorsiflexion 5 5  Ankle plantarflexion    Ankle inversion    Ankle eversion     (Blank rows = not tested)   FUNCTIONAL TESTS:  06/19/24: 5x sit to stand: 20.44 seconds   Modified Oswestry: 14/50=28%   07/13/24: 5x sit to stand: 16.76 seconds  GAIT: Distance walked: 100 Assistive device utilized: None Level of assistance: Complete Independence Comments: bil knee flexion, wide base of support Steps: negotiating with alternating pattern and use of hands for support, pain with  uncontrolled descent with descending    TODAY'S TREATMENT:                                                                                                                              DATE:   07/25/24 Leg Press: seat 9, 90#  x20, 45# x30 Seated hamstring stretch 3x20 seconds  Long arc quads: 5  hold 2x10- added 3#  Trunk rotation:green band anchored in door 2x10 bil each  Standing hip abduction with TA activation 3x10 3# added standing on balance pad Farmer's carry: 10# KB 2 laps around both gyms each hand position Alternating step taps 6 with min UE support and 10# farmer's carry   07/20/24 NuStep: Level 6x 8 min- PT present to discuss progress Leg Press: seat 9, 90#  x20, 45# x20 Seated hamstring stretch 3x20 seconds  Long arc quads: 5 hold 2x10- added 3#  Trunk rotation: red band anchored in door 2x10 bil each  Standing hip abduction with TA activation 3x10 3# added standing on balance pad Farmer's carry: 5# KB 2 laps around both gyms each hand position Alternating step taps 6 with min UE support and 2.5# ankle weights    07/13/24 NuStep: Level 6x 8 min- PT present to discuss progress Leg Press: seat 9, 70# x10, 80#  x20, 45# x20 Seated hamstring stretch 3x20 seconds  Long arc quads: 5 hold 2x10- added 2.5#  Standing hip abduction with TA activation 3x10 2.5# added standing on balance pad Farmer's carry: 5# KB 2 laps around both gyms each hand position Alternating step taps 6 with min UE support and 2.5# ankle weights   PATIENT EDUCATION:  Education details: Access Code: [email or web address], use walking stick for community distances and football stadiums Person educated: Patient Education method: Programmer, multimedia, Facilities manager, and Handouts Education comprehension: verbalized understanding and returned demonstration  HOME EXERCISE PROGRAM: Access Code: [email or web address] URL: [email or web address] Date: 07/13/2024 Prepared by: Burnard  Exercises - Seated Hamstring Stretch  -  3 x daily - 7 x weekly - 1 sets - 3 reps - 20 hold - Supine Lower Trunk Rotation  - 3 x daily - 7 x weekly - 1 sets - 3 reps - 20 hold - Hooklying Single Knee to Chest Stretch  - 3 x daily - 7 x weekly - 1 sets - 3 reps - 20 hold - Seated Figure 4 Piriformis Stretch  - 3 x daily - 7 x weekly - 1 sets - 3 reps - 20 hold - Heel Raises with Counter Support  - 1 x daily - 7 x weekly - 3 sets - 10 reps - Clamshell  - 1 x daily - 7 x weekly - 2 sets - 10 reps - Standing Hip Abduction with Counter Support  - 1 x daily - 7 x weekly - 1-2 sets - 10 reps - Seated Long Arc Quad  - 2 x daily - 7 x weekly - 2 sets - 10 reps - 5 hold - Seated Sciatic Nerve Glide With Cervical Motion  - 2-3 x daily - 7 x weekly - 1 sets - 10 reps  ASSESSMENT:  CLINICAL IMPRESSION: Pt reports 30% overall reduction in knee pain and is no longer having back pain.   He is regularly walking 45 min in the evening and reports good balance and endurance with this.  5x sit to stand time is improved, indicating overall improved balance.  His knee pain is most limiting with negotiating steps and with negotiating stadium steps at [employer] football games.  PT monitored for pain, technique and fatigue throughout session.    Patient will benefit from skilled PT to address the below impairments and improve overall function.   OBJECTIVE IMPAIRMENTS: Abnormal gait, decreased activity tolerance, decreased balance, decreased endurance, difficulty walking, decreased strength, decreased safety awareness, increased muscle spasms, impaired flexibility, improper body mechanics, and pain.   ACTIVITY LIMITATIONS: carrying, lifting,  standing, squatting, stairs, transfers, and locomotion level  PARTICIPATION LIMITATIONS: cleaning, laundry, community activity, and yard work  PERSONAL FACTORS: Age, Time since onset of injury/illness/exacerbation, and 1 comorbidity: OA are also affecting patient's functional outcome.   REHAB POTENTIAL: Good  CLINICAL  DECISION MAKING: Evolving/moderate complexity  EVALUATION COMPLEXITY: Moderate   GOALS: Goals reviewed with patient? Yes  SHORT TERM GOALS: Target date: 07/17/2024     Be independent in initial HEP Baseline: consistent with HEP Goal status: MET  2.  Perform 5x sit to stand in < 16 seconds without UE  support to reduce falls risk Baseline: 16.76 (07/13/24) Goal status: IN PROGRESS  3.  Improve bil knee strength and stability to ascend 8 with use of 1 rail for safety in negotiating steps at high school football games Baseline: working on this and using walking stick (07/04/24) Goal status: IN PROGRESS  4.  Stand > or = to 30 min without limitation due to LBP.   Baseline: no limitations (07/25/24)  Goal Status: MET  LONG TERM GOALS: Target date: 08/14/2024    Be independent in advanced HEP Baseline:  Goal status: IN PROGRESS  2.  Perform 5x sit to stand in < or = to 13 seconds to reduce falls risk  Baseline: 16.76 seconds  Goal status: IN PROGRESS  3.  Improve Modified Oswestry to < or = to 18% disability  Baseline: 28% disability  Goal status: IN PROGRESS  4.  Stand for > or = to 45 min without limitation due to LBP Baseline: no limitations  Goal status: MET  5.  Improve LE strength to squat to feed his cat and return to standing with 50% increased ease  Baseline: pain and  less difficulty getting up (07/25/24) Goal status: IN PROGRESS PLAN:  PT FREQUENCY: 2x/week  PT DURATION: 8 weeks  PLANNED INTERVENTIONS: Therapeutic exercises, Therapeutic activity, Neuromuscular re-education, Balance training, Gait training, Patient/Family education, Self Care, Joint mobilization, Stair training, Vestibular training, Aquatic Therapy, Dry Needling, Cryotherapy, Moist heat, Manual therapy, and Re-evaluation  PLAN FOR NEXT SESSION: Quad and glute strength, balance tasks, hip and knee flexibility, functional mobility, work on steps for football stadium.  Review nerve glides,  standing rotation to work toward playing golf.    Burnard Joy, PT 07/25/24 11:00 AM   [facility] [address], [address] [place], [place] [address] Phone # [phone] Fax [phone]

## 2024-07-26 ENCOUNTER — Other Ambulatory Visit (INDEPENDENT_AMBULATORY_CARE_PROVIDER_SITE_OTHER): Payer: Self-pay

## 2024-07-26 ENCOUNTER — Encounter: Payer: Self-pay | Admitting: Orthopaedic Surgery

## 2024-07-26 ENCOUNTER — Ambulatory Visit (INDEPENDENT_AMBULATORY_CARE_PROVIDER_SITE_OTHER): Admitting: Orthopaedic Surgery

## 2024-07-26 DIAGNOSIS — M1711 Unilateral primary osteoarthritis, right knee: Secondary | ICD-10-CM | POA: Diagnosis not present

## 2024-07-26 DIAGNOSIS — M1712 Unilateral primary osteoarthritis, left knee: Secondary | ICD-10-CM

## 2024-07-26 NOTE — Progress Notes (Signed)
 The patient is a 78 year old active male well-known to me.  He is also a patient of Dr. Arthea Sharps.  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  His son is a cardiologist and daughter is a physical therapist.  He has been working on quad strengthening exercises and recently had injections in both his knees by Dr. Sharps earlier this month.  He states his main complaint is really just going up stairs.  His left knee hurts worse than the right knee.  He walks about 2 miles a day and he has no pain when he walks.  His main complaint is just getting up stairs with causing pain more in his knees.  He said he was still like to lose some weight and he is still not ready for considering any type of arthroplasty type of surgeries.  Examination of both knees today shows mainly patellofemoral crepitation.  Is worse on the left than the right knee.  He denies any tenderness along the medial or lateral joint lines on either knee.  Both knees are looking to be stable with excellent range of motion.    X-rays of both knees today show mainly significant patellofemoral arthritis.  The medial and lateral compartments of both knees show only minimal narrowing and the alignment is neutral.  We talked about the possibility in the future of patellofemoral replacements for when his knees are getting bad enough for him and that would be partial knee replacements.  However, if we start heading in that direction, I would recommend a MRI of his more painful knee just to really assess the cartilage in the medial and lateral compartments as well just in case we would recommend a total knee replacement as opposed to a patellofemoral arthroplasty.  As of now though, follow-up is as needed since he is stable and he feels like he is not ready yet for any type of arthroplasty surgery.  He will reach out to us  if things worsen.

## 2024-07-28 ENCOUNTER — Other Ambulatory Visit (HOSPITAL_BASED_OUTPATIENT_CLINIC_OR_DEPARTMENT_OTHER): Payer: Self-pay

## 2024-07-28 DIAGNOSIS — Z23 Encounter for immunization: Secondary | ICD-10-CM | POA: Diagnosis not present

## 2024-07-28 MED ORDER — COMIRNATY 30 MCG/0.3ML IM SUSY
0.3000 mL | PREFILLED_SYRINGE | Freq: Once | INTRAMUSCULAR | 0 refills | Status: AC
  Filled 2024-07-28: qty 0.3, 1d supply, fill #0

## 2024-07-29 ENCOUNTER — Other Ambulatory Visit (HOSPITAL_COMMUNITY): Payer: Self-pay

## 2024-07-31 ENCOUNTER — Other Ambulatory Visit (HOSPITAL_COMMUNITY): Payer: Self-pay

## 2024-07-31 MED ORDER — LOSARTAN POTASSIUM 100 MG PO TABS
100.0000 mg | ORAL_TABLET | Freq: Every day | ORAL | 3 refills | Status: AC
  Filled 2024-07-31: qty 90, 90d supply, fill #0
  Filled 2024-11-16: qty 90, 90d supply, fill #1

## 2024-08-01 ENCOUNTER — Ambulatory Visit

## 2024-08-01 DIAGNOSIS — R2689 Other abnormalities of gait and mobility: Secondary | ICD-10-CM | POA: Diagnosis not present

## 2024-08-01 DIAGNOSIS — M5459 Other low back pain: Secondary | ICD-10-CM | POA: Diagnosis not present

## 2024-08-01 DIAGNOSIS — M6281 Muscle weakness (generalized): Secondary | ICD-10-CM

## 2024-08-01 DIAGNOSIS — M25562 Pain in left knee: Secondary | ICD-10-CM | POA: Diagnosis not present

## 2024-08-01 DIAGNOSIS — G8929 Other chronic pain: Secondary | ICD-10-CM | POA: Diagnosis not present

## 2024-08-01 DIAGNOSIS — R252 Cramp and spasm: Secondary | ICD-10-CM

## 2024-08-01 DIAGNOSIS — M25561 Pain in right knee: Secondary | ICD-10-CM | POA: Diagnosis not present

## 2024-08-01 NOTE — Therapy (Signed)
 OUTPATIENT PHYSICAL THERAPY TREATMENT   Patient Name: Cory Barnett MRN: [id number] DOB:12-01-1945, 78 y.o., male Today's Date: 08/01/2024 Progress Note Reporting Period 06/19/24 to 07/25/24  See note below for Objective Data and Assessment of Progress/Goals.     END OF SESSION:  PT End of Session - 08/01/24 1119     Visit Number 11    Date for Recertification  08/14/24    Authorization Type Medicare B    Progress Note Due on Visit 20    PT Start Time 1017    PT Stop Time 1100    PT Time Calculation (min) 43 min    Activity Tolerance Patient tolerated treatment well    Behavior During Therapy WFL for tasks assessed/performed                     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   IFG (impaired fasting glucose) 01/2016   Pre-diabetes    RBBB (right bundle branch block)    Rheumatic fever    age 32 or 18, in hospital for a month, out of school for a yr   Seasonal allergies    Squamous cell carcinoma    Testicular hypofunction    Past Surgical History:  Procedure Laterality Date   EYE SURGERY     right eye  cataract surgery with lens implant   NASAL SEPTUM SURGERY     NOSE SURGERY     REVERSE SHOULDER ARTHROPLASTY Right 01/11/2020   Procedure: REVERSE SHOULDER ARTHROPLASTY;  Surgeon: Dozier Soulier, MD;  Location: [facility];  Service: Orthopedics;  Laterality: Right;   SHOULDER ARTHROSCOPY WITH SUBACROMIAL DECOMPRESSION Right 08/07/2019   Procedure: SHOULDER ARTHROSCOPY WITH SUBACROMIAL DECOMPRESSION;  Surgeon: Dozier Soulier, MD;  Location: [facility];  Service: Orthopedics;  Laterality: Right;   SKIN CANCER EXCISION     basal and squamous cell    TONSILLECTOMY     VASECTOMY     WISDOM TOOTH EXTRACTION     Patient Active  Problem List   Diagnosis Date Noted   Bursitis of both hips 02/23/2023   Degenerative arthritis of knee, bilateral 11/08/2022   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   Left knee pain 08/05/2020   Piriformis syndrome of left side 08/18/2019   Rotator cuff tear, right 05/29/2019   Closed fracture of distal clavicle 05/15/2019   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    PCP: Shayne Anes, MD   REFERRING PROVIDER: Claudene Hussar, MD  REFERRING DIAG:  Diagnosis  M17.0 (ICD-10-CM) - Primary osteoarthritis of both knees  M54.16 (ICD-10-CM) - Lumbar radiculitis    THERAPY DIAG:  Chronic pain of left knee  Muscle weakness (generalized)  Other abnormalities of gait and mobility  Chronic pain of right knee  Other low back pain  Cramp and spasm  Rationale for Evaluation and Treatment: Rehabilitation  ONSET DATE: chronic with recent falls   SUBJECTIVE:   SUBJECTIVE STATEMENT: Dr Vernetta said I have arthritis behind my knee caps.  He wants me to keep working to get stronger.    PERTINENT HISTORY: Anxiety, HTN, reverse shoulder replacement (2021), basal cell carcinoma  PAIN: 08/01/24 Are you having pain? Yes: NPRS scale: knee 0-10/10, back 0-2/10 with  standing  Pain location: Lt>Rt knee , low back  Pain description: knees (sharp), low back (dull ache) Aggravating factors: going up steps, standing long periods, squatting to feed cat  Relieving factors: sitting down, not climbing steps   PRECAUTIONS: Fall  WEIGHT BEARING RESTRICTIONS: No  FALLS:  Has patient fallen in last 6 months? No  LIVING ENVIRONMENT: Lives with: lives alone, recent widower Lives in: House/apartment Stairs: Yes: External: 3 steps; on right going up Has following equipment at home: None  OCCUPATION: retired  PLOF: Independent, Vocation/Vocational requirements: desk work, walking-retired but small amount of work, and Leisure: walking for exercises Pilates every week in a small group, works out  with trainer 2x/wk. 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 No acute osseous abnormalities  Right knee:  Degenerative changes. No acute osseous abnormalities.   US  on 12/10/23:  Limited muscular skeletal ultrasound was performed and interpreted by CLAUDENE HUSSAR, M  Left knee does have significant decrease in the hypoechoic changes noted.  Does have narrowing of the patellofemoral joint bilaterally but worsening on the right side. Impression: Worsening effusion of the right knee compared to left  07/12/24: X-ray of hip/pelvis:  IMPRESSION: 1. No evidence of fracture or dislocation. 2. 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 3. 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  COGNITION: Overall cognitive status: Within functional limits for tasks assessed     SENSATION: WFL  MUSCLE LENGTH: Hamstring flexibility limited by 25% bilaterally  POSTURE: rounded shoulders and forward head  PALPATION: Diffuse palpable tenderness in bil quads, and with patellar mobs/compression   LOWER EXTREMITY ROM:  Knee extension limited   LOWER EXTREMITY MMT:  MMT Right eval Left eval  Hip flexion 4 4  Hip extension 4+ 4+  Hip abduction 4 4  Hip adduction    Hip internal rotation    Hip external rotation    Knee flexion 4+ 4+  Knee extension 4+ 4+  Ankle dorsiflexion 5 5  Ankle plantarflexion    Ankle inversion    Ankle eversion     (Blank rows = not tested)   FUNCTIONAL TESTS:  06/19/24: 5x sit to stand: 20.44 seconds   Modified Oswestry: 14/50=28%   07/13/24: 5x sit to stand: 16.76 seconds  GAIT: Distance walked: 100 Assistive device utilized: None Level of assistance: Complete Independence Comments: bil knee flexion, wide base of support Steps: negotiating with alternating pattern and use of hands for support, pain with  uncontrolled descent with descending    TODAY'S TREATMENT:                                                                                                                              DATE:    08/01/24 Leg Press: seat 9, 90#  x20, 45# x30 NuStep: level 6x 10  minutes- PT present to discuss Standing hamstring stretch 3x20 seconds- using power plate  Long arc quads: 5 hold 2x10- added 3#  Trunk rotation:green band anchored in door 2x10 bil each  Pallof press in tandem stance: green band 2x10 bil each  Standing on power plate: strength mode, 30 second vibration, mini squats x 3 rounds  Farmer's carry: 10# KB 2 laps around both gyms each hand position   07/25/24 Leg Press: seat 9, 90#  x20, 45# x30 Seated hamstring stretch 3x20 seconds  Long arc quads: 5 hold 2x10- added 3#  Trunk rotation:green band anchored in door 2x10 bil each  Standing hip abduction with TA activation 3x10 3# added standing on balance pad Farmer's carry: 10# KB 2 laps around both gyms each hand position Alternating step taps 6 with min UE support and 10# farmer's carry   07/20/24 NuStep: Level 6x 8 min- PT present to discuss progress Leg Press: seat 9, 90#  x20, 45# x20 Seated hamstring stretch 3x20 seconds  Long arc quads: 5 hold 2x10- added 3#  Trunk rotation: red band anchored in door 2x10 bil each  Standing hip abduction with TA activation 3x10 3# added standing on balance pad Farmer's carry: 5# KB 2 laps around both gyms each hand position Alternating step taps 6 with min UE support and 2.5# ankle weights   PATIENT EDUCATION:  Education details: Access Code: [email or web address], use walking stick for community distances and football stadiums Person educated: Patient Education method: Programmer, multimedia, Facilities manager, and Handouts Education comprehension: verbalized understanding and returned demonstration  HOME EXERCISE PROGRAM: Access Code: [email or web address] URL: [email or web address] Date:  07/13/2024 Prepared by: Burnard  Exercises - Seated Hamstring Stretch  - 3 x daily - 7 x weekly - 1 sets - 3 reps - 20 hold - Supine Lower Trunk Rotation  - 3 x daily - 7 x weekly - 1 sets - 3 reps - 20 hold - Hooklying Single Knee to Chest Stretch  - 3 x daily - 7 x weekly - 1 sets - 3 reps - 20 hold - Seated Figure 4 Piriformis Stretch  - 3 x daily - 7 x weekly - 1 sets - 3 reps - 20 hold - Heel Raises with Counter Support  - 1 x daily - 7 x weekly - 3 sets - 10 reps - Clamshell  - 1 x daily - 7 x weekly - 2 sets - 10 reps - Standing Hip Abduction with Counter Support  - 1 x daily - 7 x weekly - 1-2 sets - 10 reps - Seated Long Arc Quad  - 2 x daily - 7 x weekly - 2 sets - 10 reps - 5 hold - Seated Sciatic Nerve Glide With Cervical Motion  - 2-3 x daily - 7 x weekly - 1 sets - 10 reps  ASSESSMENT:  CLINICAL IMPRESSION: Pt reports that he is able to navigate the stadium steps with increased ease.  He brings his walking stick with him to games for safety and rarely needs to use it now.  He is regularly walking 45 min in the evening and reports good balance and endurance and minimal pain with this.  We discussed that he might reduce his frequency with PT and he will add a day at [employer] to replace it.  PT monitored for pain, technique and fatigue throughout session.    Patient will benefit from skilled PT to address the below impairments and improve overall function.   OBJECTIVE IMPAIRMENTS: Abnormal gait, decreased activity  tolerance, decreased balance, decreased endurance, difficulty walking, decreased strength, decreased safety awareness, increased muscle spasms, impaired flexibility, improper body mechanics, and pain.   ACTIVITY LIMITATIONS: carrying, lifting, standing, squatting, stairs, transfers, and locomotion level  PARTICIPATION LIMITATIONS: cleaning, laundry, community activity, and yard work  PERSONAL FACTORS: Age, Time since onset of injury/illness/exacerbation, and 1  comorbidity: OA are also affecting patient's functional outcome.   REHAB POTENTIAL: Good  CLINICAL DECISION MAKING: Evolving/moderate complexity  EVALUATION COMPLEXITY: Moderate   GOALS: Goals reviewed with patient? Yes  SHORT TERM GOALS: Target date: 07/17/2024     Be independent in initial HEP Baseline: consistent with HEP Goal status: MET  2.  Perform 5x sit to stand in < 16 seconds without UE  support to reduce falls risk Baseline: 16.76 (07/13/24) Goal status: IN PROGRESS  3.  Improve bil knee strength and stability to ascend 8 with use of 1 rail for safety in negotiating steps at high school football games Baseline: working on this and using walking stick (07/04/24) Goal status: IN PROGRESS  4.  Stand > or = to 30 min without limitation due to LBP.   Baseline: no limitations (07/25/24)  Goal Status: MET  LONG TERM GOALS: Target date: 08/14/2024    Be independent in advanced HEP Baseline:  Goal status: IN PROGRESS  2.  Perform 5x sit to stand in < or = to 13 seconds to reduce falls risk  Baseline: 16.76 seconds  Goal status: IN PROGRESS  3.  Improve Modified Oswestry to < or = to 18% disability  Baseline: 28% disability  Goal status: IN PROGRESS  4.  Stand for > or = to 45 min without limitation due to LBP Baseline: no limitations  Goal status: MET  5.  Improve LE strength to squat to feed his cat and return to standing with 50% increased ease  Baseline: pain and  less difficulty getting up (07/25/24) Goal status: IN PROGRESS PLAN:  PT FREQUENCY: 2x/week  PT DURATION: 8 weeks  PLANNED INTERVENTIONS: Therapeutic exercises, Therapeutic activity, Neuromuscular re-education, Balance training, Gait training, Patient/Family education, Self Care, Joint mobilization, Stair training, Vestibular training, Aquatic Therapy, Dry Needling, Cryotherapy, Moist heat, Manual therapy, and Re-evaluation  PLAN FOR NEXT SESSION: Quad and glute strength, balance tasks, hip and  knee flexibility, functional mobility, work on steps for football stadium.  Review nerve glides, standing rotation to work toward playing golf.  Multihip machine    Burnard Joy, PT 08/01/24 11:21 AM   [facility] [address] [place], [place] [address] Phone # [phone] Fax [phone]

## 2024-08-03 ENCOUNTER — Encounter: Payer: Self-pay | Admitting: Physical Therapy

## 2024-08-03 ENCOUNTER — Ambulatory Visit: Attending: Family Medicine | Admitting: Physical Therapy

## 2024-08-03 DIAGNOSIS — M5459 Other low back pain: Secondary | ICD-10-CM | POA: Diagnosis not present

## 2024-08-03 DIAGNOSIS — R2689 Other abnormalities of gait and mobility: Secondary | ICD-10-CM | POA: Diagnosis not present

## 2024-08-03 DIAGNOSIS — R252 Cramp and spasm: Secondary | ICD-10-CM | POA: Diagnosis not present

## 2024-08-03 DIAGNOSIS — G8929 Other chronic pain: Secondary | ICD-10-CM | POA: Insufficient documentation

## 2024-08-03 DIAGNOSIS — M25562 Pain in left knee: Secondary | ICD-10-CM | POA: Insufficient documentation

## 2024-08-03 DIAGNOSIS — M25561 Pain in right knee: Secondary | ICD-10-CM | POA: Insufficient documentation

## 2024-08-03 DIAGNOSIS — M6281 Muscle weakness (generalized): Secondary | ICD-10-CM | POA: Diagnosis not present

## 2024-08-03 NOTE — Therapy (Signed)
 OUTPATIENT PHYSICAL THERAPY TREATMENT   Patient Name: Cory Barnett MRN: [id number] DOB:[birth year], 78 y.o., male Today's Date: 08/03/2024    END OF SESSION:  PT End of Session - 08/03/24 1154     Visit Number 12    Date for Recertification  08/14/24    Authorization Type Medicare B    Progress Note Due on Visit 20    PT Start Time 1017    PT Stop Time 1101    PT Time Calculation (min) 44 min    Activity Tolerance Patient tolerated treatment well    Behavior During Therapy WFL for tasks assessed/performed                      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   IFG (impaired fasting glucose) 01/2016   Pre-diabetes    RBBB (right bundle branch block)    Rheumatic fever    age 64 or 60, in hospital for a month, out of school for a yr   Seasonal allergies    Squamous cell carcinoma    Testicular hypofunction    Past Surgical History:  Procedure Laterality Date   EYE SURGERY     right eye  cataract surgery with lens implant   NASAL SEPTUM SURGERY     NOSE SURGERY     REVERSE SHOULDER ARTHROPLASTY Right 01/11/2020   Procedure: REVERSE SHOULDER ARTHROPLASTY;  Surgeon: Dozier Soulier, MD;  Location: [facility];  Service: Orthopedics;  Laterality: Right;   SHOULDER ARTHROSCOPY WITH SUBACROMIAL DECOMPRESSION Right 08/07/2019   Procedure: SHOULDER ARTHROSCOPY WITH SUBACROMIAL DECOMPRESSION;  Surgeon: Dozier Soulier, MD;  Location: [facility];  Service: Orthopedics;  Laterality: Right;   SKIN CANCER EXCISION     basal and squamous cell    TONSILLECTOMY     VASECTOMY     WISDOM TOOTH EXTRACTION     Patient Active Problem List   Diagnosis Date Noted   Bursitis of both hips 02/23/2023   Degenerative arthritis of knee, bilateral  11/08/2022   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   Left knee pain 08/05/2020   Piriformis syndrome of left side 08/18/2019   Rotator cuff tear, right 05/29/2019   Closed fracture of distal clavicle 05/15/2019   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 He is having 2-3/10 pain in his knees.  PERTINENT HISTORY: Anxiety, HTN, reverse shoulder replacement (2021), basal cell carcinoma  PAIN: 08/01/24 Are you having pain? Yes: NPRS scale: knee 0-10/10, back 0-2/10 with standing  Pain location: Lt>Rt knee , low back  Pain description: knees (sharp), low back (dull ache) Aggravating factors: going up steps, standing long  periods, squatting to feed cat  Relieving factors: sitting down, not climbing steps   PRECAUTIONS: Fall  WEIGHT BEARING RESTRICTIONS: No  FALLS:  Has patient fallen in last 6 months? No  LIVING ENVIRONMENT: Lives with: lives alone, recent widower Lives in: House/apartment Stairs: Yes: External: 3 steps; on right going up Has following equipment at home: None  OCCUPATION: retired  PLOF: Independent, Vocation/Vocational requirements: desk work, walking-retired but small amount of work, and Leisure: walking for exercises Pilates every week in a small group, works out with trainer 2x/wk. Goes to HS football games to watch grandsons  PATIENT GOALS: reduce pain, improve balance   NEXT MD VISIT: 6 weeks    OBJECTIVE:   DIAGNOSTIC FINDINGS: X-ray: Left knee Degenerative changes. No acute osseous abnormalities  Right knee:  Degenerative changes. No acute osseous abnormalities.   US  on 12/10/23:  Limited muscular skeletal ultrasound was performed and interpreted by CLAUDENE HUSSAR, M  Left knee does have significant decrease in the hypoechoic changes noted.  Does have narrowing of the patellofemoral joint bilaterally but worsening on the right side. Impression: Worsening effusion of the right knee compared to left  07/12/24: X-ray of hip/pelvis:  IMPRESSION: 1. No evidence of fracture or dislocation. 2. 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 3. 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  COGNITION: Overall cognitive status: Within functional limits for tasks assessed     SENSATION: WFL  MUSCLE LENGTH: Hamstring flexibility limited by 25% bilaterally  POSTURE: rounded shoulders and forward head  PALPATION: Diffuse palpable tenderness in bil quads, and with patellar mobs/compression   LOWER EXTREMITY ROM:  Knee extension limited   LOWER EXTREMITY MMT:  MMT Right eval Left eval  Hip flexion 4 4  Hip extension 4+ 4+  Hip abduction 4 4  Hip adduction    Hip internal rotation    Hip external rotation    Knee flexion 4+ 4+  Knee extension 4+ 4+  Ankle dorsiflexion 5 5  Ankle plantarflexion    Ankle inversion    Ankle eversion     (Blank rows = not tested)   FUNCTIONAL TESTS:  06/19/24: 5x sit to stand: 20.44 seconds   Modified Oswestry: 14/50=28%   07/13/24: 5x sit to stand: 16.76 seconds  GAIT: Distance walked: 100 Assistive device utilized: None Level of assistance: Complete Independence Comments: bil knee flexion, wide base of support Steps: negotiating with alternating pattern and use of hands for support, pain with uncontrolled descent with descending    TODAY'S TREATMENT:                                                                                                                               DATE:  08/03/24 NuStep: level 6x 8 minutes- PT present to discuss Hip Machine (abduction 30#& flexion & extension 35#) x 12 bilateral  Leg Press: seat 9, 90#  x20, 40# (decreased today 45# felt very heavy) x20 Trunk rotation:green band anchored  in door 2x10 bil each  Pallof press in tandem stance: green band 2x10 bil each  Farmer's carry: 10# KB 2 laps around both gyms each hand position  08/01/24 Leg Press: seat 9, 90#  x20, 45# x30 NuStep: level 6x 10 minutes- PT present to discuss Standing hamstring stretch 3x20 seconds- using power plate  Long arc quads: 5 hold 2x10- added 3#  Trunk rotation:green band anchored in door 2x10 bil each  Pallof press in tandem stance: green band 2x10 bil each  Standing on power plate: strength mode, 30 second vibration, mini squats x 3 rounds  Farmer's carry: 10# KB 2 laps around both gyms each hand position   07/25/24 Leg Press: seat 9, 90#  x20, 45# x30 Seated hamstring stretch 3x20 seconds  Long arc quads: 5 hold 2x10- added 3#  Trunk rotation:green band anchored in door 2x10 bil each  Standing hip abduction with TA activation 3x10 3# added standing on balance pad Farmer's carry: 10# KB 2 laps around both gyms each hand position Alternating step taps 6 with min UE support and 10# farmer's carry   07/20/24 NuStep: Level 6x 8 min- PT present to discuss progress Leg Press: seat 9, 90#  x20, 45# x20 Seated hamstring stretch 3x20 seconds  Long arc quads: 5 hold 2x10- added 3#  Trunk rotation: red band anchored in door 2x10 bil each  Standing hip abduction with TA activation 3x10 3# added standing on balance pad Farmer's carry: 5# KB 2 laps around both gyms each hand position Alternating step taps 6 with min UE support and 2.5# ankle weights   PATIENT EDUCATION:  Education details: Access Code: [email or web address], use walking stick for community distances and football stadiums Person educated:  Patient Education method: Programmer, multimedia, Facilities manager, and Handouts Education comprehension: verbalized understanding and returned demonstration  HOME EXERCISE PROGRAM: Access Code: [email or web address] URL: [email or web address] Date: 07/13/2024 Prepared by: Burnard  Exercises - Seated Hamstring Stretch  - 3 x daily - 7 x weekly - 1 sets - 3 reps - 20 hold - Supine Lower Trunk Rotation  - 3 x daily - 7 x weekly - 1 sets - 3 reps - 20 hold - Hooklying Single Knee to Chest Stretch  - 3 x daily - 7 x weekly - 1 sets - 3 reps - 20 hold - Seated Figure 4 Piriformis Stretch  - 3 x daily - 7 x weekly - 1 sets - 3 reps - 20 hold - Heel Raises with Counter Support  - 1 x daily - 7 x weekly - 3 sets - 10 reps - Clamshell  - 1 x daily - 7 x weekly - 2 sets - 10 reps - Standing Hip Abduction with Counter Support  - 1 x daily - 7 x weekly - 1-2 sets - 10 reps - Seated Long Arc Quad  - 2 x daily - 7 x weekly - 2 sets - 10 reps - 5 hold - Seated Sciatic Nerve Glide With Cervical Motion  - 2-3 x daily - 7 x weekly - 1 sets - 10 reps  ASSESSMENT:  CLINICAL IMPRESSION: Patient presents with minimal knee pain today. He feels he is getting stronger with skilled therapy. Negotiating stadium stairs is getting easier with using his walking stick. [employer] 8inch step ups and it was more challenging on his left than right. PT monitored patient for fatigue and pain. Overall, he is tolerating skilled therapy well. Patient will benefit from skilled PT to address the below impairments and improve overall  function.   OBJECTIVE IMPAIRMENTS: Abnormal gait, decreased activity tolerance, decreased balance, decreased endurance, difficulty walking, decreased strength, decreased safety awareness, increased muscle spasms, impaired flexibility, improper body mechanics, and pain.   ACTIVITY LIMITATIONS: carrying, lifting, standing, squatting, stairs, transfers, and locomotion level  PARTICIPATION LIMITATIONS: cleaning,  laundry, community activity, and yard work  PERSONAL FACTORS: Age, Time since onset of injury/illness/exacerbation, and 1 comorbidity: OA are also affecting patient's functional outcome.   REHAB POTENTIAL: Good  CLINICAL DECISION MAKING: Evolving/moderate complexity  EVALUATION COMPLEXITY: Moderate   GOALS: Goals reviewed with patient? Yes  SHORT TERM GOALS: Target date: 07/17/2024     Be independent in initial HEP Baseline: consistent with HEP Goal status: MET  2.  Perform 5x sit to stand in < 16 seconds without UE  support to reduce falls risk Baseline: 16.76 (07/13/24) Goal status: IN PROGRESS  3.  Improve bil knee strength and stability to ascend 8 with use of 1 rail for safety in negotiating steps at high school football games Baseline: working on this and using walking stick (07/04/24) Goal status: IN PROGRESS  4.  Stand > or = to 30 min without limitation due to LBP.   Baseline: no limitations (07/25/24)  Goal Status: MET  LONG TERM GOALS: Target date: 08/14/2024    Be independent in advanced HEP Baseline:  Goal status: IN PROGRESS  2.  Perform 5x sit to stand in < or = to 13 seconds to reduce falls risk  Baseline: 16.76 seconds  Goal status: IN PROGRESS  3.  Improve Modified Oswestry to < or = to 18% disability  Baseline: 28% disability  Goal status: IN PROGRESS  4.  Stand for > or = to 45 min without limitation due to LBP Baseline: no limitations  Goal status: MET  5.  Improve LE strength to squat to feed his cat and return to standing with 50% increased ease  Baseline: pain and  less difficulty getting up (07/25/24) Goal status: IN PROGRESS PLAN:  PT FREQUENCY: 2x/week  PT DURATION: 8 weeks  PLANNED INTERVENTIONS: Therapeutic exercises, Therapeutic activity, Neuromuscular re-education, Balance training, Gait training, Patient/Family education, Self Care, Joint mobilization, Stair training, Vestibular training, Aquatic Therapy, Dry Needling,  Cryotherapy, Moist heat, Manual therapy, and Re-evaluation  PLAN FOR NEXT SESSION: Quad and glute strength, balance tasks, hip and knee flexibility, functional mobility, work on steps for football stadium.  Review nerve glides, standing rotation to work toward playing golf.      Kristeen Sar, PT 08/03/24 11:55 AM [facility] [address], [address] [place], [place] [address] Phone # [phone] Fax [phone]

## 2024-08-08 ENCOUNTER — Ambulatory Visit: Admitting: Physical Therapy

## 2024-08-08 ENCOUNTER — Encounter: Payer: Self-pay | Admitting: Physical Therapy

## 2024-08-08 DIAGNOSIS — R2689 Other abnormalities of gait and mobility: Secondary | ICD-10-CM | POA: Diagnosis not present

## 2024-08-08 DIAGNOSIS — M6281 Muscle weakness (generalized): Secondary | ICD-10-CM | POA: Diagnosis not present

## 2024-08-08 DIAGNOSIS — G8929 Other chronic pain: Secondary | ICD-10-CM | POA: Diagnosis not present

## 2024-08-08 DIAGNOSIS — M25561 Pain in right knee: Secondary | ICD-10-CM | POA: Diagnosis not present

## 2024-08-08 DIAGNOSIS — M25562 Pain in left knee: Secondary | ICD-10-CM | POA: Diagnosis not present

## 2024-08-08 DIAGNOSIS — M5459 Other low back pain: Secondary | ICD-10-CM | POA: Diagnosis not present

## 2024-08-08 DIAGNOSIS — R252 Cramp and spasm: Secondary | ICD-10-CM

## 2024-08-08 NOTE — Therapy (Signed)
 OUTPATIENT PHYSICAL THERAPY TREATMENT   Patient Name: Cory Barnett MRN: [id number] DOB:Nov 26, 1945, 78 y.o., male Today's Date: 08/08/2024    END OF SESSION:  PT End of Session - 08/08/24 1340     Visit Number 13    Date for Recertification  08/14/24    Authorization Type Medicare B    Progress Note Due on Visit 20    PT Start Time 1018    PT Stop Time 1103    PT Time Calculation (min) 45 min    Activity Tolerance Patient tolerated treatment well    Behavior During Therapy WFL for tasks assessed/performed                       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   IFG (impaired fasting glucose) 01/2016   Pre-diabetes    RBBB (right bundle branch block)    Rheumatic fever    age 39 or 66, in hospital for a month, out of school for a yr   Seasonal allergies    Squamous cell carcinoma    Testicular hypofunction    Past Surgical History:  Procedure Laterality Date   EYE SURGERY     right eye  cataract surgery with lens implant   NASAL SEPTUM SURGERY     NOSE SURGERY     REVERSE SHOULDER ARTHROPLASTY Right 01/11/2020   Procedure: REVERSE SHOULDER ARTHROPLASTY;  Surgeon: Dozier Soulier, MD;  Location: [facility];  Service: Orthopedics;  Laterality: Right;   SHOULDER ARTHROSCOPY WITH SUBACROMIAL DECOMPRESSION Right 08/07/2019   Procedure: SHOULDER ARTHROSCOPY WITH SUBACROMIAL DECOMPRESSION;  Surgeon: Dozier Soulier, MD;  Location: [facility];  Service: Orthopedics;  Laterality: Right;   SKIN CANCER EXCISION     basal and squamous cell    TONSILLECTOMY     VASECTOMY     WISDOM TOOTH EXTRACTION     Patient Active Problem List   Diagnosis Date Noted   Bursitis of both hips 02/23/2023   Degenerative arthritis of knee, bilateral  11/08/2022   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   Left knee pain 08/05/2020   Piriformis syndrome of left side 08/18/2019   Rotator cuff tear, right 05/29/2019   Closed fracture of distal clavicle 05/15/2019   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 He felt good after last treatment session. He has some right sided back pain today after his personal training session   PERTINENT HISTORY: Anxiety, HTN, reverse shoulder replacement (2021), basal cell carcinoma  PAIN: 08/01/24 Are you having pain? Yes: NPRS scale: knee 0-10/10, back 0-2/10 with standing  Pain location: Lt>Rt knee , low back  Pain  description: knees (sharp), low back (dull ache) Aggravating factors: going up steps, standing long periods, squatting to feed cat  Relieving factors: sitting down, not climbing steps   PRECAUTIONS: Fall  WEIGHT BEARING RESTRICTIONS: No  FALLS:  Has patient fallen in last 6 months? No  LIVING ENVIRONMENT: Lives with: lives alone, recent widower Lives in: House/apartment Stairs: Yes: External: 3 steps; on right going up Has following equipment at home: None  OCCUPATION: retired  PLOF: Independent, Vocation/Vocational requirements: desk work, walking-retired but small amount of work, and Leisure: walking for exercises Pilates every week in a small group, works out with trainer 2x/wk. 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 No acute osseous abnormalities  Right knee:  Degenerative changes. No acute osseous abnormalities.   US  on 12/10/23:  Limited muscular skeletal ultrasound was performed and interpreted by CLAUDENE HUSSAR, M  Left knee does have significant decrease in the hypoechoic changes noted.  Does have narrowing of the patellofemoral joint bilaterally but worsening on the right side. Impression: Worsening effusion of the right knee compared to left  07/12/24: X-ray of hip/pelvis:  IMPRESSION: 1. No evidence of fracture or dislocation. 2. 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 3. 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  COGNITION: Overall cognitive status: Within functional limits for tasks assessed     SENSATION: WFL  MUSCLE LENGTH: Hamstring flexibility limited by 25% bilaterally  POSTURE: rounded shoulders and forward head  PALPATION: Diffuse palpable tenderness in bil quads, and with patellar mobs/compression   LOWER EXTREMITY ROM:  Knee extension limited   LOWER EXTREMITY MMT:  MMT Right eval Left eval  Hip flexion 4 4  Hip extension 4+ 4+  Hip abduction 4 4  Hip adduction    Hip internal rotation    Hip external rotation    Knee flexion 4+ 4+  Knee extension 4+ 4+  Ankle dorsiflexion 5 5  Ankle plantarflexion    Ankle inversion    Ankle eversion     (Blank rows = not tested)   FUNCTIONAL TESTS:  06/19/24: 5x sit to stand: 20.44 seconds   Modified Oswestry: 14/50=28%   07/13/24: 5x sit to stand: 16.76 seconds  GAIT: Distance walked: 100 Assistive device utilized: None Level of assistance: Complete Independence Comments: bil knee flexion, wide base of support Steps: negotiating with alternating pattern and use of hands for support, pain with uncontrolled descent with descending    TODAY'S  TREATMENT:                                                                                                                              DATE:  08/08/24 NuStep: level 6x 8 minutes- PT present to discuss Leg Press: seat 9, 90#  x20, unilateral 45# (then increased to 50#) x20 Hip Machine (abduction &  flexion 35# ; extension 45#) x 15 bilateral  Trunk rotation:green band anchored in door 2x10 bil each  Pallof press in tandem stance: green band 2x10 bil each  Farmer's carry: 15# KB 2 laps around both gyms each hand position    08/03/24 NuStep: level 6x 8 minutes- PT present to discuss Hip Machine (abduction 30#& flexion & extension 35#) x 12 bilateral  Leg Press: seat 9, 90#  x20, 40# (decreased today 45# felt very heavy) x20 Trunk rotation:green band anchored in door 2x10 bil each  Pallof press in tandem stance: green band 2x10 bil each  Farmer's carry: 10# KB 2 laps around both gyms each hand position  08/01/24 Leg Press: seat 9, 90#  x20, 45# x30 NuStep: level 6x 10 minutes- PT present to discuss Standing hamstring stretch 3x20 seconds- using power plate  Long arc quads: 5 hold 2x10- added 3#  Trunk rotation:green band anchored in door 2x10 bil each  Pallof press in tandem stance: green band 2x10 bil each  Standing on power plate: strength mode, 30 second vibration, mini squats x 3 rounds  Farmer's carry: 10# KB 2 laps around both gyms each hand position   07/25/24 Leg Press: seat 9, 90#  x20, 45# x30 Seated hamstring stretch 3x20 seconds  Long arc quads: 5 hold 2x10- added 3#  Trunk rotation:green band anchored in door 2x10 bil each  Standing hip abduction with TA activation 3x10 3# added standing on balance pad Farmer's carry: 10# KB 2 laps around both gyms each hand position Alternating step taps 6 with min UE support and 10# farmer's carry     PATIENT EDUCATION:  Education details: Access Code: [email or web address], use walking stick for community distances and football  stadiums Person educated: Patient Education method: Programmer, multimedia, Facilities manager, and Handouts Education comprehension: verbalized understanding and returned demonstration  HOME EXERCISE PROGRAM: Access Code: [email or web address] URL: [email or web address] Date: 07/13/2024 Prepared by: Burnard  Exercises - Seated Hamstring Stretch  - 3 x daily - 7 x weekly - 1 sets - 3 reps - 20 hold - Supine Lower Trunk Rotation  - 3 x daily - 7 x weekly - 1 sets - 3 reps - 20 hold - Hooklying Single Knee to Chest Stretch  - 3 x daily - 7 x weekly - 1 sets - 3 reps - 20 hold - Seated Figure 4 Piriformis Stretch  - 3 x daily - 7 x weekly - 1 sets - 3 reps - 20 hold - Heel Raises with Counter Support  - 1 x daily - 7 x weekly - 3 sets - 10 reps - Clamshell  - 1 x daily - 7 x weekly - 2 sets - 10 reps - Standing Hip Abduction with Counter Support  - 1 x daily - 7 x weekly - 1-2 sets - 10 reps - Seated Long Arc Quad  - 2 x daily - 7 x weekly - 2 sets - 10 reps - 5 hold - Seated Sciatic Nerve Glide With Cervical Motion  - 2-3 x daily - 7 x weekly - 1 sets - 10 reps  ASSESSMENT:  CLINICAL IMPRESSION: Deatrice verbalized having increased right sided back pain after his personal training session. Back pain did not interfere with any of his exercises today. Patient tolerated strength progessions well with no pain or difficulty. Patient demonstrated good knowledge of engaging TA while performing standing exercises. PT monitored patient throughout session and modified as needed. Patient will benefit from skilled PT to address the below impairments and  improve overall function.    OBJECTIVE IMPAIRMENTS: Abnormal gait, decreased activity tolerance, decreased balance, decreased endurance, difficulty walking, decreased strength, decreased safety awareness, increased muscle spasms, impaired flexibility, improper body mechanics, and pain.   ACTIVITY LIMITATIONS: carrying, lifting, standing, squatting, stairs, transfers, and  locomotion level  PARTICIPATION LIMITATIONS: cleaning, laundry, community activity, and yard work  PERSONAL FACTORS: Age, Time since onset of injury/illness/exacerbation, and 1 comorbidity: OA are also affecting patient's functional outcome.   REHAB POTENTIAL: Good  CLINICAL DECISION MAKING: Evolving/moderate complexity  EVALUATION COMPLEXITY: Moderate   GOALS: Goals reviewed with patient? Yes  SHORT TERM GOALS: Target date: 07/17/2024     Be independent in initial HEP Baseline: consistent with HEP Goal status: MET  2.  Perform 5x sit to stand in < 16 seconds without UE  support to reduce falls risk Baseline: 16.76 (07/13/24) Goal status: IN PROGRESS  3.  Improve bil knee strength and stability to ascend 8 with use of 1 rail for safety in negotiating steps at high school football games Baseline: working on this and using walking stick (07/04/24) Goal status: IN PROGRESS  4.  Stand > or = to 30 min without limitation due to LBP.   Baseline: no limitations (07/25/24)  Goal Status: MET  LONG TERM GOALS: Target date: 08/14/2024    Be independent in advanced HEP Baseline:  Goal status: IN PROGRESS  2.  Perform 5x sit to stand in < or = to 13 seconds to reduce falls risk  Baseline: 16.76 seconds  Goal status: IN PROGRESS  3.  Improve Modified Oswestry to < or = to 18% disability  Baseline: 28% disability  Goal status: IN PROGRESS  4.  Stand for > or = to 45 min without limitation due to LBP Baseline: no limitations  Goal status: MET  5.  Improve LE strength to squat to feed his cat and return to standing with 50% increased ease  Baseline: pain and  less difficulty getting up (07/25/24) Goal status: IN PROGRESS PLAN:  PT FREQUENCY: 2x/week  PT DURATION: 8 weeks  PLANNED INTERVENTIONS: Therapeutic exercises, Therapeutic activity, Neuromuscular re-education, Balance training, Gait training, Patient/Family education, Self Care, Joint mobilization, Stair training,  Vestibular training, Aquatic Therapy, Dry Needling, Cryotherapy, Moist heat, Manual therapy, and Re-evaluation  PLAN FOR NEXT SESSION: Quad and glute strength, balance tasks, hip and knee flexibility, functional mobility, work on steps for football stadium.  Review nerve glides, standing rotation to work toward playing golf.      Kristeen Sar, PT 08/08/24 1:41 PM [facility] [address] [place], [place] [address] Phone # [phone] Fax [phone]

## 2024-08-09 ENCOUNTER — Telehealth: Payer: Self-pay | Admitting: Family Medicine

## 2024-08-09 DIAGNOSIS — H2512 Age-related nuclear cataract, left eye: Secondary | ICD-10-CM | POA: Diagnosis not present

## 2024-08-09 DIAGNOSIS — H18512 Endothelial corneal dystrophy, left eye: Secondary | ICD-10-CM | POA: Diagnosis not present

## 2024-08-09 DIAGNOSIS — Z961 Presence of intraocular lens: Secondary | ICD-10-CM | POA: Diagnosis not present

## 2024-08-09 DIAGNOSIS — Z947 Corneal transplant status: Secondary | ICD-10-CM | POA: Diagnosis not present

## 2024-08-09 DIAGNOSIS — M47816 Spondylosis without myelopathy or radiculopathy, lumbar region: Secondary | ICD-10-CM | POA: Diagnosis not present

## 2024-08-09 NOTE — Telephone Encounter (Signed)
 Patient called as he has an MRI this afternoon. He stated that doctor claudene did not want him to have contrast and that they have him down to get contrast. He called to get the phone number for the imaging center. I provided the phone number [phone] for [facility]. FYI.

## 2024-08-10 ENCOUNTER — Ambulatory Visit

## 2024-08-10 DIAGNOSIS — G8929 Other chronic pain: Secondary | ICD-10-CM

## 2024-08-10 DIAGNOSIS — R2689 Other abnormalities of gait and mobility: Secondary | ICD-10-CM

## 2024-08-10 DIAGNOSIS — M5459 Other low back pain: Secondary | ICD-10-CM | POA: Diagnosis not present

## 2024-08-10 DIAGNOSIS — M6281 Muscle weakness (generalized): Secondary | ICD-10-CM | POA: Diagnosis not present

## 2024-08-10 DIAGNOSIS — M25562 Pain in left knee: Secondary | ICD-10-CM | POA: Diagnosis not present

## 2024-08-10 DIAGNOSIS — M25561 Pain in right knee: Secondary | ICD-10-CM | POA: Diagnosis not present

## 2024-08-10 NOTE — Therapy (Signed)
 OUTPATIENT PHYSICAL THERAPY TREATMENT   Patient Name: Cory Barnett MRN: [id number] DOB:01-09-1946, 78 y.o., male Today's Date: 08/10/2024    END OF SESSION:  PT End of Session - 08/10/24 1023     Visit Number 14    Date for Recertification  08/14/24    Authorization Type Medicare B    Progress Note Due on Visit 20    PT Start Time 0931    PT Stop Time 1017    PT Time Calculation (min) 46 min    Activity Tolerance Patient tolerated treatment well    Behavior During Therapy [facility] for tasks assessed/performed                        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   IFG (impaired fasting glucose) 01/2016   Pre-diabetes    RBBB (right bundle branch block)    Rheumatic fever    age 43 or 46, in hospital for a month, out of school for a yr   Seasonal allergies    Squamous cell carcinoma    Testicular hypofunction    Past Surgical History:  Procedure Laterality Date   EYE SURGERY     right eye  cataract surgery with lens implant   NASAL SEPTUM SURGERY     NOSE SURGERY     REVERSE SHOULDER ARTHROPLASTY Right 01/11/2020   Procedure: REVERSE SHOULDER ARTHROPLASTY;  Surgeon: Dozier Soulier, MD;  Location: [facility];  Service: Orthopedics;  Laterality: Right;   SHOULDER ARTHROSCOPY WITH SUBACROMIAL DECOMPRESSION Right 08/07/2019   Procedure: SHOULDER ARTHROSCOPY WITH SUBACROMIAL DECOMPRESSION;  Surgeon: Dozier Soulier, MD;  Location: [facility];  Service: Orthopedics;  Laterality: Right;   SKIN CANCER EXCISION     basal and squamous cell    TONSILLECTOMY     VASECTOMY     WISDOM TOOTH EXTRACTION     Patient Active Problem List   Diagnosis Date Noted   Bursitis of both hips 02/23/2023   Degenerative arthritis of knee, bilateral  11/08/2022   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   Left knee pain 08/05/2020   Piriformis syndrome of left side 08/18/2019   Rotator cuff tear, right 05/29/2019   Closed fracture of distal clavicle 05/15/2019   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 I'm getting better.  I'm trying to get over 10,000 steps a day.    PERTINENT HISTORY: Anxiety, HTN, reverse shoulder replacement (2021), basal cell carcinoma  PAIN: 08/10/24 Are you having pain? Yes: NPRS scale: knee 0-10/10, back 0-2/10 with standing  Pain location: Lt>Rt knee , low back  Pain description: knees (sharp), low  back (dull ache) Aggravating factors: going up steps, standing long periods, squatting to feed cat  Relieving factors: sitting down, not climbing steps   PRECAUTIONS: Fall  WEIGHT BEARING RESTRICTIONS: No  FALLS:  Has patient fallen in last 6 months? No  LIVING ENVIRONMENT: Lives with: lives alone, recent widower Lives in: House/apartment Stairs: Yes: External: 3 steps; on right going up Has following equipment at home: None  OCCUPATION: retired  PLOF: Independent, Vocation/Vocational requirements: desk work, walking-retired but small amount of work, and Leisure: walking for exercises Pilates every week in a small group, works out with trainer 2x/wk. Goes to HS football games to watch grandsons  PATIENT GOALS: reduce pain, improve balance   NEXT MD  VISIT: 6 weeks   OBJECTIVE:   DIAGNOSTIC FINDINGS: X-ray: Left knee Degenerative changes. No acute osseous abnormalities  Right knee:  Degenerative changes. No acute osseous abnormalities.   US  on 12/10/23:  Limited muscular skeletal ultrasound was performed and interpreted by CLAUDENE HUSSAR, M  Left knee does have significant decrease in the hypoechoic changes noted.  Does have narrowing of the patellofemoral joint bilaterally but worsening on the right side. Impression: Worsening effusion of the right knee compared to left  07/12/24: X-ray of hip/pelvis:  IMPRESSION: 1. No evidence of fracture or dislocation. 2. 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 3. 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  COGNITION: Overall cognitive status: Within functional limits for tasks assessed     SENSATION: WFL  MUSCLE LENGTH: Hamstring flexibility limited by 25% bilaterally  POSTURE: rounded shoulders and forward head  PALPATION: Diffuse palpable tenderness in bil quads, and with patellar mobs/compression   LOWER EXTREMITY ROM:  Knee extension limited   LOWER EXTREMITY MMT:  MMT Right eval Left eval  Hip flexion 4 4  Hip extension 4+ 4+  Hip abduction 4 4  Hip adduction    Hip internal rotation    Hip external rotation    Knee flexion 4+ 4+  Knee extension 4+ 4+  Ankle dorsiflexion 5 5  Ankle plantarflexion    Ankle inversion    Ankle eversion     (Blank rows = not tested)   FUNCTIONAL TESTS:  06/19/24: 5x sit to stand: 20.44 seconds   Modified Oswestry: 14/50=28%   07/13/24: 5x sit to stand: 16.76 seconds  GAIT: Distance walked: 100 Assistive device utilized: None Level of assistance: Complete Independence Comments: bil knee flexion, wide base of support Steps: negotiating with alternating pattern and use of hands for support, pain with uncontrolled descent with descending    TODAY'S TREATMENT:                                                                                                                               DATE:  08/10/24 NuStep: level 6x 8 minutes- PT present to discuss Leg Press: seat 9, 90#  x20, unilateral increased to 50# x20 Hip Machine (abduction & flexion 40# ; extension 45#)  x 15 bilateral  Trunk rotation:green band anchored in door 2x10 bil each  Ball roll outs 3 ways x 4 each  Overhead press unilaterally with alternating march: 6# on Lt, 3# on Rt  Farmer's carry: 15# KB 2 laps around both gyms each hand position   08/08/24 NuStep: level 6x 8 minutes- PT present to discuss Leg Press: seat 9, 90#  x20, unilateral 45# (then increased to 50#) x20 Hip Machine (abduction & flexion 35# ; extension 45#) x 15 bilateral  Trunk rotation:green band anchored in door 2x10 bil each  Pallof press in tandem stance: green band 2x10 bil each  Farmer's carry: 15# KB 2 laps around both gyms each hand position    08/03/24 NuStep: level 6x 8 minutes- PT present to discuss Hip Machine (abduction 30#& flexion & extension 35#) x 12 bilateral  Leg Press: seat 9, 90#  x20, 40# (decreased today 45# felt very heavy) x20 Trunk rotation:green band anchored in door 2x10 bil each  Pallof press in tandem stance: green band 2x10 bil each  Farmer's carry: 10# KB 2 laps around both gyms each hand position   PATIENT EDUCATION:  Education details: Access Code: [email or web address], use walking stick for community distances and football stadiums Person educated: Patient Education method: Programmer, multimedia, Facilities manager, and Handouts Education comprehension: verbalized understanding and returned demonstration  HOME EXERCISE PROGRAM: Access Code: [email or web address] URL: [email or web address] Date: 07/13/2024 Prepared by: Burnard  Exercises - Seated Hamstring Stretch  - 3 x daily - 7 x weekly - 1 sets - 3 reps - 20 hold - Supine Lower Trunk Rotation  - 3 x daily - 7 x weekly - 1 sets - 3 reps - 20 hold - Hooklying Single Knee to Chest Stretch  - 3 x daily - 7 x  weekly - 1 sets - 3 reps - 20 hold - Seated Figure 4 Piriformis Stretch  - 3 x daily - 7 x weekly - 1 sets - 3 reps - 20 hold - Heel Raises with Counter Support  - 1 x daily - 7 x weekly - 3 sets - 10 reps - Clamshell  - 1 x daily - 7 x weekly - 2 sets - 10 reps - Standing Hip Abduction with Counter Support  - 1 x daily - 7 x weekly - 1-2 sets - 10 reps - Seated Long Arc Quad  - 2 x daily - 7 x weekly - 2 sets - 10 reps - 5 hold - Seated Sciatic Nerve Glide With Cervical Motion  - 2-3 x daily - 7 x weekly - 1 sets - 10 reps  ASSESSMENT:  CLINICAL IMPRESSION: Pt is able to get his Lt leg in to the car without lifting it.  He walks regularly and is working to increase his step count for the day.  He is also working on [employer] training at the gym in between sessions. Patient tolerated strength progessions well with no pain or difficulty. Patient demonstrated good knowledge of engaging TA while performing standing exercises and experienced some LBP with standing trunk rotations. This resolved after stretching. PT monitored patient throughout session and modified as needed. Patient will benefit from skilled PT to address the below impairments and improve overall function.    OBJECTIVE IMPAIRMENTS: Abnormal gait, decreased activity tolerance, decreased balance, decreased endurance, difficulty walking, decreased strength, decreased safety awareness, increased muscle spasms, impaired flexibility, improper body mechanics, and pain.   ACTIVITY LIMITATIONS: carrying, lifting, standing, squatting, stairs, transfers, and locomotion level  PARTICIPATION LIMITATIONS: cleaning, laundry,  community activity, and yard work  PERSONAL FACTORS: Age, Time since onset of injury/illness/exacerbation, and 1 comorbidity: OA are also affecting patient's functional outcome.   REHAB POTENTIAL: Good  CLINICAL DECISION MAKING: Evolving/moderate complexity  EVALUATION COMPLEXITY: Moderate   GOALS: Goals reviewed with  patient? Yes  SHORT TERM GOALS: Target date: 07/17/2024     Be independent in initial HEP Baseline: consistent with HEP Goal status: MET  2.  Perform 5x sit to stand in < 16 seconds without UE  support to reduce falls risk Baseline: 16.76 (07/13/24) Goal status: IN PROGRESS  3.  Improve bil knee strength and stability to ascend 8 with use of 1 rail for safety in negotiating steps at high school football games Baseline: working on this and using walking stick (07/04/24) Goal status: IN PROGRESS  4.  Stand > or = to 30 min without limitation due to LBP.   Baseline: no limitations (07/25/24)  Goal Status: MET  LONG TERM GOALS: Target date: 08/14/2024    Be independent in advanced HEP Baseline:  Goal status: IN PROGRESS  2.  Perform 5x sit to stand in < or = to 13 seconds to reduce falls risk  Baseline: 16.76 seconds  Goal status: IN PROGRESS  3.  Improve Modified Oswestry to < or = to 18% disability  Baseline: 28% disability  Goal status: IN PROGRESS  4.  Stand for > or = to 45 min without limitation due to LBP Baseline: no limitations  Goal status: MET  5.  Improve LE strength to squat to feed his cat and return to standing with 50% increased ease  Baseline: pain and  less difficulty getting up (07/25/24) Goal status: IN PROGRESS PLAN:  PT FREQUENCY: 2x/week  PT DURATION: 8 weeks  PLANNED INTERVENTIONS: Therapeutic exercises, Therapeutic activity, Neuromuscular re-education, Balance training, Gait training, Patient/Family education, Self Care, Joint mobilization, Stair training, Vestibular training, Aquatic Therapy, Dry Needling, Cryotherapy, Moist heat, Manual therapy, and Re-evaluation  PLAN FOR NEXT SESSION: Quad and glute strength, balance tasks, hip and knee flexibility, functional mobility, work on steps for football stadium.  Review nerve glides, standing rotation to work toward playing golf.   ERO next     Burnard Joy, PT 08/10/24 10:26 AM   [facility]  [facility] [address], [address] [place], [place] [address] Phone # [phone] Fax [phone]

## 2024-08-15 ENCOUNTER — Ambulatory Visit

## 2024-08-15 DIAGNOSIS — M6281 Muscle weakness (generalized): Secondary | ICD-10-CM

## 2024-08-15 DIAGNOSIS — R2689 Other abnormalities of gait and mobility: Secondary | ICD-10-CM | POA: Diagnosis not present

## 2024-08-15 DIAGNOSIS — M25561 Pain in right knee: Secondary | ICD-10-CM | POA: Diagnosis not present

## 2024-08-15 DIAGNOSIS — M5459 Other low back pain: Secondary | ICD-10-CM | POA: Diagnosis not present

## 2024-08-15 DIAGNOSIS — G8929 Other chronic pain: Secondary | ICD-10-CM

## 2024-08-15 DIAGNOSIS — R252 Cramp and spasm: Secondary | ICD-10-CM

## 2024-08-15 DIAGNOSIS — M25562 Pain in left knee: Secondary | ICD-10-CM | POA: Diagnosis not present

## 2024-08-15 NOTE — Telephone Encounter (Signed)
 Sent patient MyChart message to follow-up.

## 2024-08-15 NOTE — Therapy (Signed)
 OUTPATIENT PHYSICAL THERAPY TREATMENT   Patient Name: Cory Barnett MRN: [id number] DOB:07/04/1946, 78 y.o., male Today's Date: 08/15/2024    END OF SESSION:  PT End of Session - 08/15/24 1016     Visit Number 15    Date for Recertification  09/12/24    Authorization Type Medicare B- KX modifier now    Progress Note Due on Visit 20    PT Start Time 0933    PT Stop Time 1017    PT Time Calculation (min) 44 min    Activity Tolerance Patient tolerated treatment well    Behavior During Therapy [facility] for tasks assessed/performed                         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   IFG (impaired fasting glucose) 01/2016   Pre-diabetes    RBBB (right bundle branch block)    Rheumatic fever    age 83 or 1, in hospital for a month, out of school for a yr   Seasonal allergies    Squamous cell carcinoma    Testicular hypofunction    Past Surgical History:  Procedure Laterality Date   EYE SURGERY     right eye  cataract surgery with lens implant   NASAL SEPTUM SURGERY     NOSE SURGERY     REVERSE SHOULDER ARTHROPLASTY Right 01/11/2020   Procedure: REVERSE SHOULDER ARTHROPLASTY;  Surgeon: Dozier Soulier, MD;  Location: [facility];  Service: Orthopedics;  Laterality: Right;   SHOULDER ARTHROSCOPY WITH SUBACROMIAL DECOMPRESSION Right 08/07/2019   Procedure: SHOULDER ARTHROSCOPY WITH SUBACROMIAL DECOMPRESSION;  Surgeon: Dozier Soulier, MD;  Location: [facility];  Service: Orthopedics;  Laterality: Right;   SKIN CANCER EXCISION     basal and squamous cell    TONSILLECTOMY     VASECTOMY     WISDOM TOOTH EXTRACTION     Patient Active Problem List   Diagnosis Date Noted   Bursitis of both hips 02/23/2023   Degenerative arthritis of  knee, bilateral 11/08/2022   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   Left knee pain 08/05/2020   Piriformis syndrome of left side 08/18/2019   Rotator cuff tear, right 05/29/2019   Closed fracture of distal clavicle 05/15/2019   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    PCP: Shayne Anes, MD   REFERRING PROVIDER: Claudene Hussar, MD  REFERRING DIAG:  Diagnosis  M17.0 (ICD-10-CM) - Primary osteoarthritis of both knees  M54.16 (ICD-10-CM) - Lumbar radiculitis    THERAPY DIAG:  Chronic pain of left knee - Plan: PT plan of care cert/re-cert  Muscle weakness (generalized) - Plan: PT plan of care cert/re-cert  Other abnormalities of gait and mobility - Plan: PT plan of care cert/re-cert  Chronic pain of right knee - Plan: PT plan of care cert/re-cert  Other low back pain - Plan: PT plan of care cert/re-cert  Cramp and spasm - Plan: PT plan of care cert/re-cert  Rationale for Evaluation and Treatment: Rehabilitation  ONSET DATE: chronic with recent falls   SUBJECTIVE:   SUBJECTIVE STATEMENT: I don't have to lift my left leg into my car anymore. Steps at stadium are much easier.  Walking daily 11,000-12,000 a day.   PERTINENT HISTORY: Anxiety, HTN, reverse shoulder replacement (2021), basal cell carcinoma  PAIN: 08/15/24 Are you having pain? Yes: NPRS scale: knee 0-5/10, back 0-2/10 at night  Pain location: Lt>Rt knee , low back  Pain description: knees (sharp), low back (dull ache) Aggravating factors: going up steps, standing long periods, squatting to feed cat  Relieving factors: sitting down, not climbing steps   PRECAUTIONS: Fall  WEIGHT BEARING RESTRICTIONS: No  FALLS:  Has patient fallen in last 6 months? No  LIVING ENVIRONMENT: Lives with: lives alone, recent widower Lives in: House/apartment Stairs: Yes: External: 3 steps; on right going up Has following equipment at home: None  OCCUPATION: retired  PLOF: Independent, Vocation/Vocational requirements:  desk work, walking-retired but small amount of work, and Leisure: walking for exercises Pilates every week in a small group, works out with trainer 2x/wk. 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 No acute osseous abnormalities  Right knee:  Degenerative changes. No acute osseous abnormalities.   US  on 12/10/23:  Limited muscular skeletal ultrasound was performed and interpreted by CLAUDENE HUSSAR, M  Left knee does have significant decrease in the hypoechoic changes noted.  Does have narrowing of the patellofemoral joint bilaterally but worsening on the right side. Impression: Worsening effusion of the right knee compared to left  07/12/24: X-ray of hip/pelvis:  IMPRESSION: 1. No evidence of fracture or dislocation. 2. 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 3. 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  COGNITION: Overall cognitive status: Within functional limits for tasks assessed     SENSATION: WFL  MUSCLE LENGTH: Hamstring flexibility limited by 25% bilaterally  POSTURE: rounded shoulders and forward head  PALPATION: Diffuse palpable tenderness in bil quads, and with patellar mobs/compression   LOWER EXTREMITY ROM:  Knee extension limited   LOWER EXTREMITY MMT:  MMT Right eval Left eval  Hip flexion 4 4  Hip extension 4+ 4+  Hip abduction 4 4  Hip adduction    Hip internal rotation    Hip external rotation    Knee flexion 4+ 4+  Knee extension 4+ 4+  Ankle dorsiflexion 5 5  Ankle plantarflexion    Ankle inversion    Ankle eversion     (Blank rows = not tested)   FUNCTIONAL TESTS:  06/19/24: 5x sit to stand: 20.44 seconds   Modified Oswestry: 14/50=28%   07/13/24: 5x sit to stand: 16.76 seconds  08/15/24:  5x sit to stand: 13.95  GAIT: Distance walked: 100 Assistive device utilized: None Level  of assistance: Complete Independence Comments: bil knee flexion, wide base of support Steps: negotiating with alternating pattern and use of hands for support, pain with uncontrolled descent with descending    TODAY'S TREATMENT:  DATE:  08/15/24 NuStep: level 6x 8 minutes- PT present to discuss Leg Press: seat 9, 90#  x20, unilateral increased to 50# x20 Hip Machine (abduction & flexion 40# ; extension 45#) x 15 bilateral  Trunk rotation:green band anchored in door 2x10 bil each  Ball roll outs 3 ways x 4 each  Overhead press unilaterally with alternating march: 6# on Lt, 3# on Rt  Farmer's carry: 15# KB 2 laps around both gyms each hand position  08/10/24 NuStep: level 6x 8 minutes- PT present to discuss Leg Press: seat 9, 90#  x20, unilateral increased to 50# x20 Hip Machine (abduction & flexion 40# ; extension 45#) x 15 bilateral  Trunk rotation:green band anchored in door 2x10 bil each  Ball roll outs 3 ways x 4 each  Overhead press unilaterally with alternating march: 6# on Lt, 3# on Rt  Farmer's carry: 15# KB 2 laps around both gyms each hand position   08/08/24 NuStep: level 6x 8 minutes- PT present to discuss Leg Press: seat 9, 90#  x20, unilateral 45# (then increased to 50#) x20 Hip Machine (abduction & flexion 35# ; extension 45#) x 15 bilateral  Trunk rotation:green band anchored in door 2x10 bil each  Pallof press in tandem stance: green band 2x10 bil each  Farmer's carry: 15# KB 2 laps around both gyms each hand position   PATIENT EDUCATION:  Education details: Access Code: [email or web address], use walking stick for community distances and football stadiums Person educated: Patient Education method: Programmer, multimedia, Facilities manager, and Handouts Education comprehension: verbalized understanding and returned demonstration  HOME EXERCISE PROGRAM: Access Code:  [email or web address] URL: [email or web address] Date: 07/13/2024 Prepared by: Burnard  Exercises - Seated Hamstring Stretch  - 3 x daily - 7 x weekly - 1 sets - 3 reps - 20 hold - Supine Lower Trunk Rotation  - 3 x daily - 7 x weekly - 1 sets - 3 reps - 20 hold - Hooklying Single Knee to Chest Stretch  - 3 x daily - 7 x weekly - 1 sets - 3 reps - 20 hold - Seated Figure 4 Piriformis Stretch  - 3 x daily - 7 x weekly - 1 sets - 3 reps - 20 hold - Heel Raises with Counter Support  - 1 x daily - 7 x weekly - 3 sets - 10 reps - Clamshell  - 1 x daily - 7 x weekly - 2 sets - 10 reps - Standing Hip Abduction with Counter Support  - 1 x daily - 7 x weekly - 1-2 sets - 10 reps - Seated Long Arc Quad  - 2 x daily - 7 x weekly - 2 sets - 10 reps - 5 hold - Seated Sciatic Nerve Glide With Cervical Motion  - 2-3 x daily - 7 x weekly - 1 sets - 10 reps  ASSESSMENT:  CLINICAL IMPRESSION: Pt is making steady progress regarding strength and mobility.  He is able to attend football games without walking stick due to improved balance.  His knee pain has reduced significantly after injections and he has been working on strength and balance.  Pt will add a day at the gym and has weaned his PT visits to 1x/wk.  5x sit to stand is improved.   PT monitored patient throughout session and modified as needed. Patient will benefit from skilled PT 1-3 more sessions to address the below impairments and improve overall function.    OBJECTIVE IMPAIRMENTS: Abnormal gait, decreased activity tolerance, decreased balance, decreased endurance, difficulty  walking, decreased strength, decreased safety awareness, increased muscle spasms, impaired flexibility, improper body mechanics, and pain.   ACTIVITY LIMITATIONS: carrying, lifting, standing, squatting, stairs, transfers, and locomotion level  PARTICIPATION LIMITATIONS: cleaning, laundry, community activity, and yard work  PERSONAL FACTORS: Age, Time since onset of  injury/illness/exacerbation, and 1 comorbidity: OA are also affecting patient's functional outcome.   REHAB POTENTIAL: Good  CLINICAL DECISION MAKING: Evolving/moderate complexity  EVALUATION COMPLEXITY: Moderate   GOALS: Goals reviewed with patient? Yes  SHORT TERM GOALS: Target date: 07/17/2024     Be independent in initial HEP Baseline: consistent with HEP Goal status: MET  2.  Perform 5x sit to stand in < 12 seconds without UE  support to reduce falls risk Baseline: 13.95 (08/15/24) Goal status: REVISED  3.  Improve bil knee strength and stability to ascend 8 with use of 1 rail for safety in negotiating steps at high school football games Baseline: working on this and using walking stick (07/04/24) Goal status: IN PROGRESS  4.  Stand > or = to 30 min without limitation due to LBP.   Baseline: no limitations (07/25/24)  Goal Status: MET  LONG TERM GOALS: Target date: 08/14/2024    Be independent in advanced HEP Baseline:  Goal status: IN PROGRESS  2.  Perform 5x sit to stand in < or = to 12 seconds to reduce falls risk  Baseline: 13.95 seconds   Goal status: REVISED  3.  Improve Modified Oswestry to < or = to 18% disability  Baseline: 28% disability  Goal status: IN PROGRESS  4.  Stand for > or = to 45 min without limitation due to LBP Baseline: no limitations  Goal status: MET  5.  Improve LE strength to squat to feed his cat and return to standing with 50% increased ease  Baseline: 40% better (08/15/24) Goal status: IN PROGRESS PLAN:  PT FREQUENCY: 2x/week  PT DURATION: 4 weeks  PLANNED INTERVENTIONS: Therapeutic exercises, Therapeutic activity, Neuromuscular re-education, Balance training, Gait training, Patient/Family education, Self Care, Joint mobilization, Stair training, Vestibular training, Aquatic Therapy, Dry Needling, Cryotherapy, Moist heat, Manual therapy, and Re-evaluation  PLAN FOR NEXT SESSION: Quad and glute strength, balance tasks, hip  and knee flexibility, functional mobility, work on steps for football stadium.  2-3 more sessions to finalize gym program and HEP.     Burnard Joy, PT 08/15/24 10:19 AM   [facility] [address] [place], [place] [address] Phone # [phone] Fax [phone]

## 2024-08-17 ENCOUNTER — Encounter

## 2024-08-22 ENCOUNTER — Encounter

## 2024-08-24 ENCOUNTER — Ambulatory Visit

## 2024-08-24 DIAGNOSIS — M25561 Pain in right knee: Secondary | ICD-10-CM | POA: Diagnosis not present

## 2024-08-24 DIAGNOSIS — M6281 Muscle weakness (generalized): Secondary | ICD-10-CM | POA: Diagnosis not present

## 2024-08-24 DIAGNOSIS — R2689 Other abnormalities of gait and mobility: Secondary | ICD-10-CM | POA: Diagnosis not present

## 2024-08-24 DIAGNOSIS — G8929 Other chronic pain: Secondary | ICD-10-CM

## 2024-08-24 DIAGNOSIS — M5459 Other low back pain: Secondary | ICD-10-CM | POA: Diagnosis not present

## 2024-08-24 DIAGNOSIS — M25562 Pain in left knee: Secondary | ICD-10-CM | POA: Diagnosis not present

## 2024-08-24 NOTE — Therapy (Signed)
 OUTPATIENT PHYSICAL THERAPY TREATMENT   Patient Name: Cory Barnett MRN: [id number] DOB:September 19, 1946, 78 y.o., male Today's Date: 08/24/2024    END OF SESSION:  PT End of Session - 08/24/24 1107     Visit Number 16    Date for Recertification  09/12/24    Authorization Type Medicare B- KX modifier now    Progress Note Due on Visit 20    PT Start Time 1016    PT Stop Time 1101    PT Time Calculation (min) 45 min    Activity Tolerance Patient tolerated treatment well    Behavior During Therapy WFL for tasks assessed/performed                          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   IFG (impaired fasting glucose) 01/2016   Pre-diabetes    RBBB (right bundle branch block)    Rheumatic fever    age 71 or 39, in hospital for a month, out of school for a yr   Seasonal allergies    Squamous cell carcinoma    Testicular hypofunction    Past Surgical History:  Procedure Laterality Date   EYE SURGERY     right eye  cataract surgery with lens implant   NASAL SEPTUM SURGERY     NOSE SURGERY     REVERSE SHOULDER ARTHROPLASTY Right 01/11/2020   Procedure: REVERSE SHOULDER ARTHROPLASTY;  Surgeon: Dozier Soulier, MD;  Location: [facility];  Service: Orthopedics;  Laterality: Right;   SHOULDER ARTHROSCOPY WITH SUBACROMIAL DECOMPRESSION Right 08/07/2019   Procedure: SHOULDER ARTHROSCOPY WITH SUBACROMIAL DECOMPRESSION;  Surgeon: Dozier Soulier, MD;  Location: [facility];  Service: Orthopedics;  Laterality: Right;   SKIN CANCER EXCISION     basal and squamous cell    TONSILLECTOMY     VASECTOMY     WISDOM TOOTH EXTRACTION     Patient Active Problem List   Diagnosis Date Noted   Bursitis of both hips 02/23/2023   Degenerative arthritis  of knee, bilateral 11/08/2022   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   Left knee pain 08/05/2020   Piriformis syndrome of left side 08/18/2019   Rotator cuff tear, right 05/29/2019   Closed fracture of distal clavicle 05/15/2019   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    PCP: Shayne Anes, MD   REFERRING PROVIDER: Claudene Hussar, MD  REFERRING DIAG:  Diagnosis  M17.0 (ICD-10-CM) - Primary osteoarthritis of both knees  M54.16 (ICD-10-CM) - Lumbar radiculitis    THERAPY DIAG:  Chronic pain of left knee  Muscle weakness (generalized)  Other abnormalities of gait and mobility  Chronic pain of right knee  Other low back pain  Rationale for Evaluation and Treatment: Rehabilitation  ONSET DATE: chronic with recent falls   SUBJECTIVE:   SUBJECTIVE STATEMENT: I was on a trip and the drive was fatiguing and my back got tight.  I had to miss some of my exercises but getting back on schedule soon.  Therapy has been really helping.  I walked at the zoo and didn't have any issues   PERTINENT HISTORY: Anxiety, HTN, reverse shoulder replacement (2021), basal cell carcinoma  PAIN: 08/15/24 Are  you having pain? Yes: NPRS scale: knee 0-5/10 Pain location: Lt>Rt knee , low back  Pain description: knees (sharp), low back (dull ache) Aggravating factors: going up steps, standing long periods, squatting to feed cat  Relieving factors: sitting down, not climbing steps   PRECAUTIONS: Fall  WEIGHT BEARING RESTRICTIONS: No  FALLS:  Has patient fallen in last 6 months? No  LIVING ENVIRONMENT: Lives with: lives alone, recent widower Lives in: House/apartment Stairs: Yes: External: 3 steps; on right going up Has following equipment at home: None  OCCUPATION: retired  PLOF: Independent, Vocation/Vocational requirements: desk work, walking-retired but small amount of work, and Leisure: walking for exercises Pilates every week in a small group, works out with trainer 2x/wk. Goes to HS  football games to watch grandsons  PATIENT GOALS: reduce pain, improve balance   NEXT MD VISIT: 6 weeks   OBJECTIVE:   DIAGNOSTIC FINDINGS: X-ray: Left knee Degenerative changes. No acute osseous abnormalities  Right knee:  Degenerative changes. No acute osseous abnormalities.   US  on 12/10/23:  Limited muscular skeletal ultrasound was performed and interpreted by CLAUDENE HUSSAR, M  Left knee does have significant decrease in the hypoechoic changes noted.  Does have narrowing of the patellofemoral joint bilaterally but worsening on the right side. Impression: Worsening effusion of the right knee compared to left  07/12/24: X-ray of hip/pelvis:  IMPRESSION: 1. No evidence of fracture or dislocation. 2. 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 3. 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  COGNITION: Overall cognitive status: Within functional limits for tasks assessed     SENSATION: WFL  MUSCLE LENGTH: Hamstring flexibility limited by 25% bilaterally  POSTURE: rounded shoulders and forward head  PALPATION: Diffuse palpable tenderness in bil quads, and with patellar mobs/compression   LOWER EXTREMITY ROM:  Knee extension limited   LOWER EXTREMITY MMT:  MMT Right eval Left eval  Hip flexion 4 4  Hip extension 4+ 4+  Hip abduction 4 4  Hip adduction    Hip internal rotation    Hip external rotation    Knee flexion 4+ 4+  Knee extension 4+ 4+  Ankle dorsiflexion 5 5  Ankle plantarflexion    Ankle inversion    Ankle eversion     (Blank rows = not tested)   FUNCTIONAL TESTS:  06/19/24: 5x sit to stand: 20.44 seconds   Modified Oswestry: 14/50=28%   07/13/24: 5x sit to stand: 16.76 seconds  08/15/24:  5x sit to stand: 13.95  GAIT: Distance walked: 100 Assistive device utilized: None Level of assistance: Complete Independence Comments: bil knee flexion, wide base of support Steps: negotiating with alternating pattern and use of hands for support, pain  with uncontrolled descent with descending   Short Physical Performance Battery:    Balance:       Side by side stance:    points (1)  Semi -tandem:     points (1)  Tandem:      points (2)    Gait Speed: (34m=9.84 feet) done 2x take the best time:      points                                           Unable=0                                           >  6.52 sec=1 pt                                           4.66-6.52 sec=2 pt                                           3.62-4.65 sec=3 pt                                          <3.62 sec= 4 pts    Repeated Chair Stands:      points   (timer stopped when straight on 5th stand)                                              If used arms=0 pts                                              > 60 sec=0 pts                                              16.70 or more=1 pt                                               13.70-16.69=2 pts                                               11.20-13.69=3 pts                                               11.9 sec or less=4 pts Total score=      points <10/12 predictive of 1 or more mobility limitations and increased risk of mobility disability 6 or less/12 associated with a higher fall rate                                                                                                  TODAY'S TREATMENT:  DATE:   08/24/24 NuStep: level 7x 8 minutes- PT present to discuss Leg Press: seat 9, 90#  x20, unilateral increased to 50# x20 Hip Machine (abduction & flexion, extension 45#) x 15 bilateral  Trunk rotation:green band anchored in door 2x10 bil each  Step over obstacles and on cobble pads- high hurdles with SBA  Overhead press unilaterally with alternating march: 6# on Lt, 3# on Rt  Farmer's carry: 15# KB 2 laps around both gyms each hand position   08/15/24 NuStep: level 6x 8  minutes- PT present to discuss Leg Press: seat 9, 90#  x20, unilateral increased to 50# x20 Hip Machine (abduction & flexion 40# ; extension 45#) x 15 bilateral  Trunk rotation:green band anchored in door 2x10 bil each  Ball roll outs 3 ways x 4 each  Overhead press unilaterally with alternating march: 6# on Lt, 3# on Rt  Farmer's carry: 15# KB 2 laps around both gyms each hand position  08/10/24 NuStep: level 6x 8 minutes- PT present to discuss Leg Press: seat 9, 90#  x20, unilateral increased to 50# x20 Hip Machine (abduction & flexion 40# ; extension 45#) x 15 bilateral  Trunk rotation:green band anchored in door 2x10 bil each  Ball roll outs 3 ways x 4 each  Overhead press unilaterally with alternating march: 6# on Lt, 3# on Rt  Farmer's carry: 15# KB 2 laps around both gyms each hand position    PATIENT EDUCATION:  Education details: Access Code: [email or web address], use walking stick for community distances and football stadiums Person educated: Patient Education method: Programmer, multimedia, Facilities manager, and Handouts Education comprehension: verbalized understanding and returned demonstration  HOME EXERCISE PROGRAM: Access Code: [email or web address] URL: [email or web address] Date: 07/13/2024 Prepared by: Burnard  Exercises - Seated Hamstring Stretch  - 3 x daily - 7 x weekly - 1 sets - 3 reps - 20 hold - Supine Lower Trunk Rotation  - 3 x daily - 7 x weekly - 1 sets - 3 reps - 20 hold - Hooklying Single Knee to Chest Stretch  - 3 x daily - 7 x weekly - 1 sets - 3 reps - 20 hold - Seated Figure 4 Piriformis Stretch  - 3 x daily - 7 x weekly - 1 sets - 3 reps - 20 hold - Heel Raises with Counter Support  - 1 x daily - 7 x weekly - 3 sets - 10 reps - Clamshell  - 1 x daily - 7 x weekly - 2 sets - 10 reps - Standing Hip Abduction with Counter Support  - 1 x daily - 7 x weekly - 1-2 sets - 10 reps - Seated Long Arc Quad  - 2 x daily - 7 x weekly - 2 sets - 10 reps - 5 hold - Seated Sciatic  Nerve Glide With Cervical Motion  - 2-3 x daily - 7 x weekly - 1 sets - 10 reps  ASSESSMENT:  CLINICAL IMPRESSION: Pt is making steady progress regarding strength and mobility.  He is able to attend football games without walking stick due to improved balance.  His knee pain has reduced significantly after injections and he has been working on strength and balance.  Pt will add a day at the gym and has weaned his PT visits to 1x/wk.  We will address higher level balance and set new goals next session to continue to work on this for improved community safety.  Patient will benefit from skilled PT to address the below impairments and improve overall  function.     OBJECTIVE IMPAIRMENTS: Abnormal gait, decreased activity tolerance, decreased balance, decreased endurance, difficulty walking, decreased strength, decreased safety awareness, increased muscle spasms, impaired flexibility, improper body mechanics, and pain.   ACTIVITY LIMITATIONS: carrying, lifting, standing, squatting, stairs, transfers, and locomotion level  PARTICIPATION LIMITATIONS: cleaning, laundry, community activity, and yard work  PERSONAL FACTORS: Age, Time since onset of injury/illness/exacerbation, and 1 comorbidity: OA are also affecting patient's functional outcome.   REHAB POTENTIAL: Good  CLINICAL DECISION MAKING: Evolving/moderate complexity  EVALUATION COMPLEXITY: Moderate   GOALS: Goals reviewed with patient? Yes  SHORT TERM GOALS: Target date: 07/17/2024     Be independent in initial HEP Baseline: consistent with HEP Goal status: MET  2.  Perform 5x sit to stand in < 12 seconds without UE  support to reduce falls risk Baseline: 13.95 (08/15/24) Goal status: REVISED  3.  Improve bil knee strength and stability to ascend 8 with use of 1 rail for safety in negotiating steps at high school football games Baseline: working on this and using walking stick (07/04/24) Goal status: IN PROGRESS  4.  Stand > or  = to 30 min without limitation due to LBP.   Baseline: no limitations (07/25/24)  Goal Status: MET  LONG TERM GOALS: Target date: 08/14/2024    Be independent in advanced HEP Baseline:  Goal status: IN PROGRESS  2.  Perform 5x sit to stand in < or = to 12 seconds to reduce falls risk  Baseline: 13.95 seconds   Goal status: REVISED  3.  Improve Modified Oswestry to < or = to 18% disability  Baseline: 28% disability  Goal status: IN PROGRESS  4.  Stand for > or = to 45 min without limitation due to LBP Baseline: no limitations  Goal status: MET  5.  Improve LE strength to squat to feed his cat and return to standing with 50% increased ease  Baseline: 40% better (08/15/24) Goal status: IN PROGRESS PLAN:  PT FREQUENCY: 2x/week  PT DURATION: 4 weeks  PLANNED INTERVENTIONS: Therapeutic exercises, Therapeutic activity, Neuromuscular re-education, Balance training, Gait training, Patient/Family education, Self Care, Joint mobilization, Stair training, Vestibular training, Aquatic Therapy, Dry Needling, Cryotherapy, Moist heat, Manual therapy, and Re-evaluation  PLAN FOR NEXT SESSION: Quad and glute strength, balance tasks, hip and knee flexibility, functional mobility, balance.  Assess higher level balance next and do ERO, Give short performance physical battery    Burnard Joy, PT 08/24/24 11:09 AM   [facility] [address], [address] [place], [place] [address] Phone # [phone] Fax [phone]

## 2024-08-25 ENCOUNTER — Encounter: Payer: Self-pay | Admitting: Cardiology

## 2024-08-29 ENCOUNTER — Ambulatory Visit

## 2024-08-29 ENCOUNTER — Encounter: Payer: Self-pay | Admitting: Family Medicine

## 2024-08-29 DIAGNOSIS — M5459 Other low back pain: Secondary | ICD-10-CM | POA: Diagnosis not present

## 2024-08-29 DIAGNOSIS — M6281 Muscle weakness (generalized): Secondary | ICD-10-CM | POA: Diagnosis not present

## 2024-08-29 DIAGNOSIS — M25562 Pain in left knee: Secondary | ICD-10-CM | POA: Diagnosis not present

## 2024-08-29 DIAGNOSIS — G8929 Other chronic pain: Secondary | ICD-10-CM

## 2024-08-29 DIAGNOSIS — R2689 Other abnormalities of gait and mobility: Secondary | ICD-10-CM

## 2024-08-29 DIAGNOSIS — M25561 Pain in right knee: Secondary | ICD-10-CM | POA: Diagnosis not present

## 2024-08-29 NOTE — Therapy (Signed)
 OUTPATIENT PHYSICAL THERAPY TREATMENT   Patient Name: Cory Barnett MRN: [id number] DOB:October 24, 1946, 78 y.o., male Today's Date: 08/29/2024    END OF SESSION:  PT End of Session - 08/29/24 1111     Visit Number 17    Date for Recertification  10/24/24    Authorization Type Medicare B- KX modifier now    Progress Note Due on Visit 20    PT Start Time 1016    PT Stop Time 1059    PT Time Calculation (min) 43 min    Activity Tolerance Patient tolerated treatment well    Behavior During Therapy WFL for tasks assessed/performed                           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   IFG (impaired fasting glucose) 01/2016   Pre-diabetes    RBBB (right bundle branch block)    Rheumatic fever    age 42 or 76, in hospital for a month, out of school for a yr   Seasonal allergies    Squamous cell carcinoma    Testicular hypofunction    Past Surgical History:  Procedure Laterality Date   EYE SURGERY     right eye  cataract surgery with lens implant   NASAL SEPTUM SURGERY     NOSE SURGERY     REVERSE SHOULDER ARTHROPLASTY Right 01/11/2020   Procedure: REVERSE SHOULDER ARTHROPLASTY;  Surgeon: Dozier Soulier, MD;  Location: [facility];  Service: Orthopedics;  Laterality: Right;   SHOULDER ARTHROSCOPY WITH SUBACROMIAL DECOMPRESSION Right 08/07/2019   Procedure: SHOULDER ARTHROSCOPY WITH SUBACROMIAL DECOMPRESSION;  Surgeon: Dozier Soulier, MD;  Location: [facility];  Service: Orthopedics;  Laterality: Right;   SKIN CANCER EXCISION     basal and squamous cell    TONSILLECTOMY     VASECTOMY     WISDOM TOOTH EXTRACTION     Patient Active Problem List   Diagnosis Date Noted   Bursitis of both hips 02/23/2023   Degenerative  arthritis of knee, bilateral 11/08/2022   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   Left knee pain 08/05/2020   Piriformis syndrome of left side 08/18/2019   Rotator cuff tear, right 05/29/2019   Closed fracture of distal clavicle 05/15/2019   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    PCP: Shayne Anes, MD   REFERRING PROVIDER: Claudene Hussar, MD  REFERRING DIAG:  Diagnosis  M17.0 (ICD-10-CM) - Primary osteoarthritis of both knees  M54.16 (ICD-10-CM) - Lumbar radiculitis    THERAPY DIAG:  Chronic pain of left knee - Plan: PT plan of care cert/re-cert  Muscle weakness (generalized) - Plan: PT plan of care cert/re-cert  Other abnormalities of gait and mobility - Plan: PT plan of care cert/re-cert  Chronic pain of right knee - Plan: PT plan of care cert/re-cert  Other low back pain - Plan: PT plan of care cert/re-cert  Rationale for Evaluation and Treatment: Rehabilitation  ONSET DATE: chronic with recent falls   SUBJECTIVE:   SUBJECTIVE STATEMENT: I haven't been able to walk as much outside due to rainy weather.  I'm going to [facility] so won't be here next week.  PT is helping  me.  I feel more steady and improved strength with squatting.    PERTINENT HISTORY: Anxiety, HTN, reverse shoulder replacement (2021), basal cell carcinoma  PAIN: 08/29/24 Are you having pain? Yes: NPRS scale: knee 0-5/10 Pain location: Lt>Rt knee , low back  Pain description: knees (sharp), low back (dull ache) Aggravating factors: going up steps, standing long periods, squatting to feed cat  Relieving factors: sitting down, not climbing steps   PRECAUTIONS: Fall  WEIGHT BEARING RESTRICTIONS: No  FALLS:  Has patient fallen in last 6 months? No  LIVING ENVIRONMENT: Lives with: lives alone, recent widower Lives in: House/apartment Stairs: Yes: External: 3 steps; on right going up Has following equipment at home: None  OCCUPATION: retired  PLOF: Independent, Vocation/Vocational  requirements: desk work, walking-retired but small amount of work, and Leisure: walking for exercises Pilates every week in a small group, works out with trainer 2x/wk. 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 No acute osseous abnormalities  Right knee:  Degenerative changes. No acute osseous abnormalities.   US  on 12/10/23:  Limited muscular skeletal ultrasound was performed and interpreted by CLAUDENE HUSSAR, M  Left knee does have significant decrease in the hypoechoic changes noted.  Does have narrowing of the patellofemoral joint bilaterally but worsening on the right side. Impression: Worsening effusion of the right knee compared to left  07/12/24: X-ray of hip/pelvis:  IMPRESSION: 1. No evidence of fracture or dislocation. 2. 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 3. 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  COGNITION: Overall cognitive status: Within functional limits for tasks assessed     SENSATION: WFL  MUSCLE LENGTH: Hamstring flexibility limited by 25% bilaterally  POSTURE: rounded shoulders and forward head  PALPATION: Diffuse palpable tenderness in bil quads, and with patellar mobs/compression   LOWER EXTREMITY ROM:  Knee extension limited   LOWER EXTREMITY MMT:  MMT Right eval Left eval  Hip flexion 4 4  Hip extension 4+ 4+  Hip abduction 4 4  Hip adduction    Hip internal rotation    Hip external rotation    Knee flexion 4+ 4+  Knee extension 4+ 4+  Ankle dorsiflexion 5 5  Ankle plantarflexion    Ankle inversion    Ankle eversion     (Blank rows = not tested)   FUNCTIONAL TESTS:  06/19/24: 5x sit to stand: 20.44 seconds   Modified Oswestry: 14/50=28%   07/13/24: 5x sit to stand: 16.76 seconds  08/15/24:  5x sit to stand: 13.95  GAIT: Distance walked: 100 Assistive device utilized:  None Level of assistance: Complete Independence Comments: bil knee flexion, wide base of support Steps: negotiating with alternating pattern and use of hands for support, pain with uncontrolled descent with descending   08/29/24: Short Physical Performance Battery:    Balance:       Side by side stance:    points (1)-1  Semi -tandem:     points (1)-1  Tandem:      points (2)-0    Gait Speed: (49m=9.84 feet) done 2x take the best time:    points                                           Unable=0                                           >  6.52 sec=1 pt                                           4.66-6.52 sec=2 pt                                           3.62-4.65 sec=3 pt                                          <3.62 sec= 4 pts- 3.12 seconds     Repeated Chair Stands:      points   (timer stopped when straight on 5th stand)                                              If used arms=0 pts                                              > 60 sec=0 pts                                              16.70 or more=1 pt                                               13.70-16.69=2 pts                                               11.20-13.69=3 pts-13.17 seconds                                                11.9 sec or less=4 pts Total score=    9/12  points <10/12 predictive of 1 or more mobility limitations and increased risk of mobility disability 6 or less/12 associated with a higher fall rate                                                 The Patient-Specific Functional Scale  Initial:  I am going to ask you to identify up to 3 important activities that you are unable to do or are having difficulty with as a result of this problem.  Today are there any activities that you are unable to do or having difficulty with because of this?  (Patient shown scale and patient rated  each activity)  Follow up: When you first came in you had difficulty performing these activities.  Today do you still have  difficulty?  Patient-Specific activity scoring scheme (Point to one number):  0 1 2 3 4 5 6 7 8 9  10 Unable                                                                                                          Able to perform To perform                                                                                                    activity at the same Activity         Level as before                                                                                                                       Injury or problem  Activity  Negotiating obstacles and steps at [place] stadium                 Initial: 6/10 (08/29/24)                  follow up:  2. Squatting to feed cat                                                                        Initial:    6/10 (08/29/24)                   follow up:                                              TODAY'S TREATMENT:  DATE:   08/29/24 NuStep: level 7x 6 minutes- PT present to discuss Objective testing for recert- see above  Leg Press: seat 9, 90#  x20, unilateral  50# x20 Hip Machine (abduction & flexion, extension 45#) 2x 15 bilateral  Resisted walking 20# forward and reverse x10 each-close CGA Discussed safety with walking in [place]- take walking stick due to unlevel surfaces.  08/24/24 NuStep: level 7x 8 minutes- PT present to discuss Leg Press: seat 9, 90#  x20, unilateral increased to 50# x20 Hip Machine (abduction & flexion, extension 45#) x 15 bilateral  Trunk rotation:green band anchored in door 2x10 bil each  Step over obstacles and on cobble pads- high hurdles with SBA  Overhead press unilaterally with alternating march: 6# on Lt, 3# on Rt  Farmer's carry: 15# KB 2 laps around both gyms each hand position   08/15/24 NuStep: level 6x 8 minutes- PT present to discuss Leg Press: seat 9, 90#  x20,  unilateral increased to 50# x20 Hip Machine (abduction & flexion 40# ; extension 45#) x 15 bilateral  Trunk rotation:green band anchored in door 2x10 bil each  Ball roll outs 3 ways x 4 each  Overhead press unilaterally with alternating march: 6# on Lt, 3# on Rt  Farmer's carry: 15# KB 2 laps around both gyms each hand position   PATIENT EDUCATION:  Education details: Access Code: [email or web address], use walking stick for community distances and football stadiums Person educated: Patient Education method: Programmer, Multimedia, Facilities Manager, and Handouts Education comprehension: verbalized understanding and returned demonstration  HOME EXERCISE PROGRAM: Access Code: [email or web address] URL: [email or web address] Date: 07/13/2024 Prepared by: Burnard  Exercises - Seated Hamstring Stretch  - 3 x daily - 7 x weekly - 1 sets - 3 reps - 20 hold - Supine Lower Trunk Rotation  - 3 x daily - 7 x weekly - 1 sets - 3 reps - 20 hold - Hooklying Single Knee to Chest Stretch  - 3 x daily - 7 x weekly - 1 sets - 3 reps - 20 hold - Seated Figure 4 Piriformis Stretch  - 3 x daily - 7 x weekly - 1 sets - 3 reps - 20 hold - Heel Raises with Counter Support  - 1 x daily - 7 x weekly - 3 sets - 10 reps - Clamshell  - 1 x daily - 7 x weekly - 2 sets - 10 reps - Standing Hip Abduction with Counter Support  - 1 x daily - 7 x weekly - 1-2 sets - 10 reps - Seated Long Arc Quad  - 2 x daily - 7 x weekly - 2 sets - 10 reps - 5 hold - Seated Sciatic Nerve Glide With Cervical Motion  - 2-3 x daily - 7 x weekly - 1 sets - 10 reps  ASSESSMENT:  CLINICAL IMPRESSION: Pt is making steady progress regarding strength and mobility.  He is able to attend football games without walking stick due to improved balance.  On patient specific functional scale, he reports his ability to negotiate high steps at the football stadium and squat to feed his cat is 6/10. His score on short performance physical battery is 9/12 today and <10/12  predictive of 1 or more mobility limitations and increased risk of mobility disability.  His knee pain has reduced significantly after injections and he has been working on strength and balance.  Pt continues to add a day at [employer] for endurance and strength gains.   We will work on functional strength  and higher level balance tasks to continue to improve his function and safety and home an in the community.  Patient will benefit from skilled PT to address the below impairments and improve overall function.     OBJECTIVE IMPAIRMENTS: Abnormal gait, decreased activity tolerance, decreased balance, decreased endurance, difficulty walking, decreased strength, decreased safety awareness, increased muscle spasms, impaired flexibility, improper body mechanics, and pain.   ACTIVITY LIMITATIONS: carrying, lifting, standing, squatting, stairs, transfers, and locomotion level  PARTICIPATION LIMITATIONS: cleaning, laundry, community activity, and yard work  PERSONAL FACTORS: Age, Time since onset of injury/illness/exacerbation, and 1 comorbidity: OA are also affecting patient's functional outcome.   REHAB POTENTIAL: Good  CLINICAL DECISION MAKING: Evolving/moderate complexity  EVALUATION COMPLEXITY: Moderate   GOALS: Goals reviewed with patient? Yes  SHORT TERM GOALS: Target date: 07/17/2024     Be independent in initial HEP Baseline: consistent with HEP Goal status: MET  2.  Perform 5x sit to stand in < 12 seconds without UE  support to reduce falls risk Baseline: 13.95 (08/15/24) Goal status: REVISED  3.  Improve bil knee strength and stability to ascend 8 with use of 1 rail for safety in negotiating steps at high school football games Baseline: working on this and using walking stick (07/04/24) Goal status: IN PROGRESS  4.  Stand > or = to 30 min without limitation due to LBP.   Baseline: no limitations (07/25/24)  Goal Status: MET  LONG TERM GOALS: Target date: 10/24/2024   Be  independent in advanced HEP Baseline:  Goal status: IN PROGRESS  2.  Improve score on short performance physical battery to > or = to 11/12 Baseline: 9/12  Goal status: INITIAL  3.  Improve Modified Oswestry to < or = to 18% disability  Baseline: 28% disability  Goal status: IN PROGRESS  4. Improve PSFS for squatting to feed cat to > or = to 7/10 due to improved LE strength Baseline: 6/10 Goal status: INITIAL  5.  Improve PSFS for negotiating stadium steps to > or = to 7/10 Baseline: 6/10 (08/29/24) Goal status: INITIAL PLAN:  PT FREQUENCY: 1-2x/week  PT DURATION:  8 weeks  PLANNED INTERVENTIONS: Therapeutic exercises, Therapeutic activity, Neuromuscular re-education, Balance training, Gait training, Patient/Family education, Self Care, Joint mobilization, Stair training, Vestibular training, Aquatic Therapy, Dry Needling, Cryotherapy, Moist heat, Manual therapy, and Re-evaluation  PLAN FOR NEXT SESSION: Quad and glute strength, balance tasks, hip and knee flexibility, functional mobility, balance.      Burnard Joy, PT 08/29/24 11:13 AM   [facility] [address] [place], [place] [address] Phone # [phone] Fax [phone]

## 2024-08-30 ENCOUNTER — Other Ambulatory Visit (HOSPITAL_COMMUNITY): Payer: Self-pay

## 2024-09-04 ENCOUNTER — Encounter: Payer: Self-pay | Admitting: Radiology

## 2024-09-12 ENCOUNTER — Ambulatory Visit: Attending: Family Medicine

## 2024-09-12 DIAGNOSIS — M6281 Muscle weakness (generalized): Secondary | ICD-10-CM | POA: Diagnosis not present

## 2024-09-12 DIAGNOSIS — R2689 Other abnormalities of gait and mobility: Secondary | ICD-10-CM | POA: Insufficient documentation

## 2024-09-12 DIAGNOSIS — M25561 Pain in right knee: Secondary | ICD-10-CM | POA: Diagnosis not present

## 2024-09-12 DIAGNOSIS — M5459 Other low back pain: Secondary | ICD-10-CM | POA: Insufficient documentation

## 2024-09-12 DIAGNOSIS — F4323 Adjustment disorder with mixed anxiety and depressed mood: Secondary | ICD-10-CM | POA: Diagnosis not present

## 2024-09-12 DIAGNOSIS — M25562 Pain in left knee: Secondary | ICD-10-CM | POA: Diagnosis not present

## 2024-09-12 DIAGNOSIS — G8929 Other chronic pain: Secondary | ICD-10-CM | POA: Diagnosis not present

## 2024-09-12 NOTE — Therapy (Signed)
 OUTPATIENT PHYSICAL THERAPY TREATMENT   Patient Name: Cory Barnett MRN: [id number] DOB:17-Apr-1946, 78 y.o., male Today's Date: 09/12/2024    END OF SESSION:  PT End of Session - 09/12/24 1024     Visit Number 18    Date for Recertification  10/24/24    Authorization Type Medicare B- KX modifier now    Progress Note Due on Visit 20    PT Start Time 0932    PT Stop Time 1014    PT Time Calculation (min) 42 min    Activity Tolerance Patient tolerated treatment well    Behavior During Therapy WFL for tasks assessed/performed                            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   IFG (impaired fasting glucose) 01/2016   Pre-diabetes    RBBB (right bundle branch block)    Rheumatic fever    age 57 or 47, in hospital for a month, out of school for a yr   Seasonal allergies    Squamous cell carcinoma    Testicular hypofunction    Past Surgical History:  Procedure Laterality Date   EYE SURGERY     right eye  cataract surgery with lens implant   NASAL SEPTUM SURGERY     NOSE SURGERY     REVERSE SHOULDER ARTHROPLASTY Right 01/11/2020   Procedure: REVERSE SHOULDER ARTHROPLASTY;  Surgeon: Dozier Soulier, MD;  Location: [facility];  Service: Orthopedics;  Laterality: Right;   SHOULDER ARTHROSCOPY WITH SUBACROMIAL DECOMPRESSION Right 08/07/2019   Procedure: SHOULDER ARTHROSCOPY WITH SUBACROMIAL DECOMPRESSION;  Surgeon: Dozier Soulier, MD;  Location: [facility];  Service: Orthopedics;  Laterality: Right;   SKIN CANCER EXCISION     basal and squamous cell    TONSILLECTOMY     VASECTOMY     WISDOM TOOTH EXTRACTION     Patient Active Problem List   Diagnosis Date Noted   Bursitis of both hips 02/23/2023   Degenerative  arthritis of knee, bilateral 11/08/2022   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   Left knee pain 08/05/2020   Piriformis syndrome of left side 08/18/2019   Rotator cuff tear, right 05/29/2019   Closed fracture of distal clavicle 05/15/2019   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    PCP: Shayne Anes, MD   REFERRING PROVIDER: Claudene Hussar, MD  REFERRING DIAG:  Diagnosis  M17.0 (ICD-10-CM) - Primary osteoarthritis of both knees  M54.16 (ICD-10-CM) - Lumbar radiculitis    THERAPY DIAG:  Chronic pain of left knee  Muscle weakness (generalized)  Other abnormalities of gait and mobility  Chronic pain of right knee  Other low back pain  Rationale for Evaluation and Treatment: Rehabilitation  ONSET DATE: chronic with recent falls   SUBJECTIVE:   SUBJECTIVE STATEMENT: I did well on my trip.  I didn't take my walking stick and I used the sidewalks mostly.   PERTINENT HISTORY: Anxiety, HTN, reverse shoulder replacement (2021), basal cell carcinoma  PAIN: 08/29/24 Are you having pain? Yes: NPRS scale: knee 0-5/10 Pain location: Lt>Rt knee , low back  Pain description: knees (sharp), low back (dull ache) Aggravating factors:  going up steps, standing long periods, squatting to feed cat  Relieving factors: sitting down, not climbing steps   PRECAUTIONS: Fall  WEIGHT BEARING RESTRICTIONS: No  FALLS:  Has patient fallen in last 6 months? No  LIVING ENVIRONMENT: Lives with: lives alone, recent widower Lives in: House/apartment Stairs: Yes: External: 3 steps; on right going up Has following equipment at home: None  OCCUPATION: retired  PLOF: Independent, Vocation/Vocational requirements: desk work, walking-retired but small amount of work, and Leisure: walking for exercises Pilates every week in a small group, works out with trainer 2x/wk. Goes to HS football games to watch grandsons  PATIENT GOALS: reduce pain, improve balance   NEXT MD VISIT: 6 weeks   OBJECTIVE:    DIAGNOSTIC FINDINGS: X-ray: Left knee Degenerative changes. No acute osseous abnormalities  Right knee:  Degenerative changes. No acute osseous abnormalities.   US  on 12/10/23:  Limited muscular skeletal ultrasound was performed and interpreted by CLAUDENE HUSSAR, M  Left knee does have significant decrease in the hypoechoic changes noted.  Does have narrowing of the patellofemoral joint bilaterally but worsening on the right side. Impression: Worsening effusion of the right knee compared to left  07/12/24: X-ray of hip/pelvis:  IMPRESSION: 1. No evidence of fracture or dislocation. 2. 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 3. 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  COGNITION: Overall cognitive status: Within functional limits for tasks assessed     SENSATION: WFL  MUSCLE LENGTH: Hamstring flexibility limited by 25% bilaterally  POSTURE: rounded shoulders and forward head  PALPATION: Diffuse palpable tenderness in bil quads, and with patellar mobs/compression   LOWER EXTREMITY ROM:  Knee extension limited   LOWER EXTREMITY MMT:  MMT Right eval Left eval  Hip flexion 4 4  Hip extension 4+ 4+  Hip abduction 4 4  Hip adduction    Hip internal rotation    Hip external rotation    Knee flexion 4+ 4+  Knee extension 4+ 4+  Ankle dorsiflexion 5 5  Ankle plantarflexion    Ankle inversion    Ankle eversion     (Blank rows = not tested)   FUNCTIONAL TESTS:  06/19/24: 5x sit to stand: 20.44 seconds   Modified Oswestry: 14/50=28%   07/13/24: 5x sit to stand: 16.76 seconds  08/15/24:  5x sit to stand: 13.95  GAIT: Distance walked: 100 Assistive device utilized: None Level of assistance: Complete Independence Comments: bil knee flexion, wide base of support Steps: negotiating with alternating pattern and use of hands for support, pain with uncontrolled descent with descending   08/29/24: Short Physical Performance Battery:    Balance:       Side by  side stance:    points (1)-1  Semi -tandem:     points (1)-1  Tandem:      points (2)-0    Gait Speed: (42m=9.84 feet) done 2x take the best time:    points                                           Unable=0                                           > 6.52 sec=1 pt  4.66-6.52 sec=2 pt                                           3.62-4.65 sec=3 pt                                          <3.62 sec= 4 pts- 3.12 seconds     Repeated Chair Stands:      points   (timer stopped when straight on 5th stand)                                              If used arms=0 pts                                              > 60 sec=0 pts                                              16.70 or more=1 pt                                               13.70-16.69=2 pts                                               11.20-13.69=3 pts-13.17 seconds                                                11.9 sec or less=4 pts Total score=    9/12  points <10/12 predictive of 1 or more mobility limitations and increased risk of mobility disability 6 or less/12 associated with a higher fall rate                                                 The Patient-Specific Functional Scale  Initial:  I am going to ask you to identify up to 3 important activities that you are unable to do or are having difficulty with as a result of this problem.  Today are there any activities that you are unable to do or having difficulty with because of this?  (Patient shown scale and patient rated each activity)  Follow up: When you first came in you had difficulty performing these activities.  Today do you still have difficulty?  Patient-Specific activity scoring scheme (Point to one number):  0 1 2 3 4 5 6 7 8 9  10 Unable  Able to perform To perform                                                                                                     activity at the same Activity         Level as before                                                                                                                       Injury or problem  Activity  Negotiating obstacles and steps at football stadium                 Initial: 6/10 (08/29/24)                  follow up:  2. Squatting to feed cat                                                                        Initial:    6/10 (08/29/24)                   follow up:                                              TODAY'S TREATMENT:                                                                                                                              DATE:   09/12/24 NuStep: level 7x 6 minutes- PT present to discuss Leg Press: seat 9, 90#  x20, unilateral  50# x20 Hip Machine (abduction & flexion, extension 45#) 2x 15 bilateral  Resisted walking 20# forward and reverse x10 each-close CGA Farmer's carry: 15# KB 2 laps around both gyms each hand position Trunk rotation: blue band 2x10 bil each      08/29/24 NuStep: level 7x 6 minutes- PT present to discuss Objective testing for recert- see above  Leg Press: seat 9, 90#  x20, unilateral  50# x20 Hip Machine (abduction & flexion, extension 45#) 2x 15 bilateral  Resisted walking 20# forward and reverse x10 each-close CGA Discussed safety with walking in [place]- take walking stick due to unlevel surfaces.  08/24/24 NuStep: level 7x 8 minutes- PT present to discuss Leg Press: seat 9, 90#  x20, unilateral increased to 50# x20 Hip Machine (abduction & flexion, extension 45#) x 15 bilateral  Trunk rotation:green band anchored in door 2x10 bil each  Step over obstacles and on cobble pads- high hurdles with SBA  Overhead press unilaterally with alternating march: 6# on Lt, 3# on Rt  Farmer's carry: 15# KB 2 laps around both gyms each hand position    PATIENT EDUCATION:  Education  details: Access Code: [email or web address], use walking stick for community distances and football stadiums Person educated: Patient Education method: Programmer, Multimedia, Facilities Manager, and Handouts Education comprehension: verbalized understanding and returned demonstration  HOME EXERCISE PROGRAM: Access Code: [email or web address] URL: [email or web address] Date: 07/13/2024 Prepared by: Burnard  Exercises - Seated Hamstring Stretch  - 3 x daily - 7 x weekly - 1 sets - 3 reps - 20 hold - Supine Lower Trunk Rotation  - 3 x daily - 7 x weekly - 1 sets - 3 reps - 20 hold - Hooklying Single Knee to Chest Stretch  - 3 x daily - 7 x weekly - 1 sets - 3 reps - 20 hold - Seated Figure 4 Piriformis Stretch  - 3 x daily - 7 x weekly - 1 sets - 3 reps - 20 hold - Heel Raises with Counter Support  - 1 x daily - 7 x weekly - 3 sets - 10 reps - Clamshell  - 1 x daily - 7 x weekly - 2 sets - 10 reps - Standing Hip Abduction with Counter Support  - 1 x daily - 7 x weekly - 1-2 sets - 10 reps - Seated Long Arc Quad  - 2 x daily - 7 x weekly - 2 sets - 10 reps - 5 hold - Seated Sciatic Nerve Glide With Cervical Motion  - 2-3 x daily - 7 x weekly - 1 sets - 10 reps  ASSESSMENT:  CLINICAL IMPRESSION: Pt with lapse in treatment due to travel.  He did well on his trip and didn't have any loss of balance while walking. He only experiences knee pain with negotiating steps.   Pt continues to add a day at [employer] for endurance and strength gains.   PT monitored throughout session for safety and cueing. We will work on functional strength and higher level balance tasks to continue to improve his function and safety and home an in the community.  Patient will benefit from skilled PT to address the below impairments and improve overall function.     OBJECTIVE IMPAIRMENTS: Abnormal gait, decreased activity tolerance, decreased balance, decreased endurance, difficulty walking, decreased strength, decreased safety awareness, increased  muscle spasms, impaired flexibility, improper body mechanics, and pain.   ACTIVITY LIMITATIONS: carrying, lifting, standing, squatting, stairs, transfers, and locomotion level  PARTICIPATION LIMITATIONS: cleaning, laundry, community activity, and yard work  PERSONAL FACTORS: Age, Time since onset of injury/illness/exacerbation, and 1 comorbidity:  OA are also affecting patient's functional outcome.   REHAB POTENTIAL: Good  CLINICAL DECISION MAKING: Evolving/moderate complexity  EVALUATION COMPLEXITY: Moderate   GOALS: Goals reviewed with patient? Yes  SHORT TERM GOALS: Target date: 07/17/2024     Be independent in initial HEP Baseline: consistent with HEP Goal status: MET  2.  Perform 5x sit to stand in < 12 seconds without UE  support to reduce falls risk Baseline: 13.95 (08/15/24) Goal status: REVISED  3.  Improve bil knee strength and stability to ascend 8 with use of 1 rail for safety in negotiating steps at high school football games Baseline: working on this and using walking stick (07/04/24) Goal status: IN PROGRESS  4.  Stand > or = to 30 min without limitation due to LBP.   Baseline: no limitations (07/25/24)  Goal Status: MET  LONG TERM GOALS: Target date: 10/24/2024   Be independent in advanced HEP Baseline:  Goal status: IN PROGRESS  2.  Improve score on short performance physical battery to > or = to 11/12 Baseline: 9/12  Goal status: INITIAL  3.  Improve Modified Oswestry to < or = to 18% disability  Baseline: 28% disability  Goal status: IN PROGRESS  4. Improve PSFS for squatting to feed cat to > or = to 7/10 due to improved LE strength Baseline: 6/10 Goal status: INITIAL  5.  Improve PSFS for negotiating stadium steps to > or = to 7/10 Baseline: 6/10 (08/29/24) Goal status: INITIAL PLAN:  PT FREQUENCY: 1-2x/week  PT DURATION:  8 weeks  PLANNED INTERVENTIONS: Therapeutic exercises, Therapeutic activity, Neuromuscular re-education, Balance  training, Gait training, Patient/Family education, Self Care, Joint mobilization, Stair training, Vestibular training, Aquatic Therapy, Dry Needling, Cryotherapy, Moist heat, Manual therapy, and Re-evaluation  PLAN FOR NEXT SESSION: Quad and glute strength, balance tasks, hip and knee flexibility, functional mobility, balance.      Burnard Joy, PT 09/12/24 10:36 AM   [facility] [address] [place], [place] [address] Phone # [phone] Fax [phone]

## 2024-09-14 NOTE — Progress Notes (Signed)
 HPI: FU palpitations and hypertension.  Previous description of palpitations felt c/w PACs or PVCs.  Cardiac CTA January 2021 showed mild disease in the proximal LAD at 25 to 49%; calcium  score 148.  Echocardiogram January 2021 showed normal LV function, moderate left atrial enlargement, mild tricuspid regurgitation, very mild aortic stenosis with mean gradient 9 mmHg and trace aortic insufficiency.  Abdominal CT November 2024 showed no aneurysm.  Aortic atherosclerosis noted.  Since last seen, patient's blood pressure has been running high despite increasing losartan  to 100 mg daily and adding HCTZ.  He denies dyspnea, chest pain, palpitations or syncope.  Current Outpatient Medications  Medication Sig Dispense Refill   amoxicillin  (AMOXIL ) 500 MG capsule Take 4 capsules by mouth 1 hour prior to dental treatment 8 capsule 2   amoxicillin -clavulanate (AUGMENTIN ) 875-125 MG tablet Take 1 tablet by mouth 2 (two) times daily  with food for 10 days 20 tablet 0   aspirin  EC 81 MG tablet Take 1 tablet (81 mg total) by mouth daily. Swallow whole. 90 tablet 0   Barberry-Oreg Grape-Goldenseal (BERBERINE COMPLEX PO) Take by mouth.     cefdinir  (OMNICEF ) 300 MG capsule Take 1 capsule (300 mg total) by mouth 2 (two) times daily with food. 20 capsule 0   cephALEXin  (KEFLEX ) 500 MG capsule Take 4 capsules (2,000 mg total) by mouth one hour prior to surgery 4 capsule 0   CHELATED MAGNESIUM PO Take 2 tablets by mouth in the morning and at bedtime.      Cholecalciferol (VITAMIN D3) 1.25 MG (50000 UT) CAPS Take by mouth.     ciprofloxacin -dexamethasone  (CIPRODEX ) OTIC suspension Place 4 drops into the right ear 2 (two) times daily for 3 days 7.5 mL 0   Coenzyme Q10 (CO Q 10) 100 MG CAPS Take 100 mg by mouth in the morning and at bedtime.      COVID-19 mRNA vaccine (SPIKEVAX ) syringe Administer Covid vaccine x once Intramuscular as directed 0.5 mL 0   diazepam  (VALIUM ) 5 MG tablet One tabet by mouth, 2 hours  before procedure. 2 tablet 0   escitalopram  (LEXAPRO ) 10 MG tablet Take 1 tablet (10 mg total) by mouth daily. 90 tablet 3   ezetimibe  (ZETIA ) 10 MG tablet Take 1 tablet (10 mg total) by mouth daily. 90 tablet 3   hydrochlorothiazide  (HYDRODIURIL ) 25 MG tablet Take 1 tablet (25 mg total) by mouth in the morning. 30 tablet 3   Investigational omega-3-fatty acid/placebo capsule S0927 Take 4 capsules by mouth 2 (two) times daily. Take with food.     losartan  (COZAAR ) 100 MG tablet Take 1 tablet (100 mg total) by mouth at bedtime. 100 tablet 3   Multiple Vitamin (MULTI-VITAMINS) TABS Take 3 tablets by mouth in the morning and at bedtime.      nirmatrelvir /ritonavir  EUA (PAXLOVID ) TABS Take 3 tablets by mouth 2 (two) times daily. 30 tablet 0   ofloxacin  (OCUFLOX ) 0.3 % ophthalmic solution Apply 4 drops in right ear twice daily for 5 days. 5 mL 0   prednisoLONE  acetate (PRED FORTE ) 1 % ophthalmic suspension Place 1 drop into the right eye 3 (three) times daily. 5 mL 3   prednisoLONE  acetate (PRED FORTE ) 1 % ophthalmic suspension Place 1 drop into the right eye 3 times daily. 10 mL 11   Probiotic Product (PROBIOTIC PO) Take 1 capsule by mouth daily.     rosuvastatin  (CRESTOR ) 10 MG tablet TAKE 1 TABLET BY MOUTH EVERY DAY 90 tablet 3  rosuvastatin  (CRESTOR ) 20 MG tablet Take 1 tablet (20 mg total) by mouth daily. 90 tablet 3   Saw Palmetto, Serenoa repens, (SAW PALMETTO PO) Take 2 capsules by mouth in the morning and at bedtime.     sildenafil  (REVATIO ) 20 MG tablet Take 1-5 tablets by mouth as needed 30 minutes-4 hours prior to sexual activity as directed. 50 tablet 11   tadalafil  (CIALIS ) 5 MG tablet Take 1 tablet (5 mg total) by mouth daily. 90 tablet 3   tadalafil  (CIALIS ) 5 MG tablet Take 1 tablet (5 mg total) by mouth daily. 90 tablet 3   tadalafil  (CIALIS ) 5 MG tablet Take 1 tablet (5 mg total) by mouth daily. 90 tablet 3   umeclidinium bromide  (INCRUSE ELLIPTA ) 62.5 MCG/ACT AEPB Inhale 1 puff  into the lungs daily (rinse mouth with water  after use) 30 each 3   No current facility-administered medications for this visit.     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   IFG (impaired fasting glucose) 01/2016   Pre-diabetes    RBBB (right bundle branch block)    Rheumatic fever    age 61 or 79, in hospital for a month, out of school for a yr   Seasonal allergies    Squamous cell carcinoma    Testicular hypofunction     Past Surgical History:  Procedure Laterality Date   EYE SURGERY     right eye  cataract surgery with lens implant   NASAL SEPTUM SURGERY     NOSE SURGERY     REVERSE SHOULDER ARTHROPLASTY Right 01/11/2020   Procedure: REVERSE SHOULDER ARTHROPLASTY;  Surgeon: Dozier Soulier, MD;  Location: [facility];  Service: Orthopedics;  Laterality: Right;   SHOULDER ARTHROSCOPY WITH SUBACROMIAL DECOMPRESSION Right 08/07/2019   Procedure: SHOULDER ARTHROSCOPY WITH SUBACROMIAL DECOMPRESSION;  Surgeon: Dozier Soulier, MD;  Location: [facility];  Service: Orthopedics;  Laterality: Right;   SKIN CANCER EXCISION     basal and squamous cell    TONSILLECTOMY     VASECTOMY     WISDOM TOOTH EXTRACTION      Social History   Socioeconomic History   Marital status: Widowed    Spouse name: Not on file   Number of children: 3   Years of education: Not on file   Highest education level: Not on file  Occupational History    Comment: Retired  Tobacco Use   Smoking status: Former    Current packs/day: 0.00    Types: Cigarettes    Quit date: 12/14/1969    Years since quitting: 54.7   Smokeless tobacco: Never  Vaping Use   Vaping status: Never Used  Substance and Sexual Activity   Alcohol  use: Yes    Alcohol /week: 2.0 standard drinks of alcohol      Types: 2 Cans of beer per week    Comment: social   Drug use: Never   Sexual activity: Not on file  Other Topics Concern   Not on file  Social History Narrative   Not on file   Social Drivers of Health   Financial Resource Strain: Not on file  Food Insecurity: Not on file  Transportation Needs: Not on file  Physical Activity:  Not on file  Stress: Not on file  Social Connections: Unknown (03/17/2022)   [facility]   Social Network    Social Network: Not on file  Intimate Partner Violence: Unknown (02/05/2022)   Received from [facility]   HITS    Physically Hurt: Not on file    Insult or Talk Down To: Not on file    Threaten Physical Harm: Not on file    Scream or Curse: Not on file    Family History  Problem Relation Age of Onset   Stroke Mother    Arthritis Mother    Cancer Mother        unknown    Dementia Mother    Arthritis Father    Mesothelioma Father    Arthritis Sister    Arthritis Sister    Bipolar disorder Sister     ROS: no fevers or chills, productive cough, hemoptysis, dysphasia, odynophagia, melena, hematochezia, dysuria, hematuria, rash, seizure activity, orthopnea, PND, pedal edema, claudication. Remaining systems are negative.  Physical Exam: Well-developed well-nourished in no acute distress.  Skin is warm and dry.  HEENT is normal.  Neck is supple.  Chest is clear to auscultation with normal expansion.  Cardiovascular exam is regular rate and rhythm. 2/6 systolic murmur Abdominal exam nontender or distended. No masses palpated. Extremities show no edema. neuro grossly intact  EKG Interpretation Date/Time:  Monday September 25 2024 11:46:56 EST Ventricular Rate:  66 PR Interval:  254 QRS Duration:  152 QT Interval:  418 QTC Calculation: 438 R Axis:   61  Text Interpretation: Sinus rhythm with 1st degree A-V block Right bundle branch block Confirmed by Pietro Rogue ([id number]) on 09/25/2024 11:59:37 AM    A/P  1  coronary artery disease-mild on previous CTA.  Patient is not having chest pain.  Continue aspirin  and statin.  2 history of very mild aortic stenosis-will plan repeat echocardiogram to reassess.  3 hypertension-patient's blood pressure is elevated; add amlodipine  5 mg daily.  Will have most recent potassium and creatinine forwarded to us  from primary care.  4 hyperlipidemia-continue Crestor  and Zetia .  Will have most recent lipids and liver forwarded to us  from primary care.  5 history of palpitations-no recent symptoms.  Rogue Pietro, MD

## 2024-09-19 ENCOUNTER — Ambulatory Visit

## 2024-09-21 ENCOUNTER — Ambulatory Visit

## 2024-09-21 DIAGNOSIS — M5459 Other low back pain: Secondary | ICD-10-CM | POA: Diagnosis not present

## 2024-09-21 DIAGNOSIS — M25561 Pain in right knee: Secondary | ICD-10-CM | POA: Diagnosis not present

## 2024-09-21 DIAGNOSIS — M25562 Pain in left knee: Secondary | ICD-10-CM | POA: Diagnosis not present

## 2024-09-21 DIAGNOSIS — G8929 Other chronic pain: Secondary | ICD-10-CM

## 2024-09-21 DIAGNOSIS — M6281 Muscle weakness (generalized): Secondary | ICD-10-CM

## 2024-09-21 DIAGNOSIS — R2689 Other abnormalities of gait and mobility: Secondary | ICD-10-CM | POA: Diagnosis not present

## 2024-09-21 NOTE — Therapy (Signed)
 OUTPATIENT PHYSICAL THERAPY TREATMENT   Patient Name: Cory Barnett MRN: [id number] DOB:[birth year], 78 y.o., male Today's Date: 09/21/2024    END OF SESSION:  PT End of Session - 09/21/24 1023     Visit Number 19    Date for Recertification  10/24/24    Authorization Type Medicare B- KX modifier now    Progress Note Due on Visit 20    PT Start Time 0935    PT Stop Time 1018    PT Time Calculation (min) 43 min    Activity Tolerance Patient tolerated treatment well    Behavior During Therapy WFL for tasks assessed/performed                             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   IFG (impaired fasting glucose) 01/2016   Pre-diabetes    RBBB (right bundle branch block)    Rheumatic fever    age 32 or 65, in hospital for a month, out of school for a yr   Seasonal allergies    Squamous cell carcinoma    Testicular hypofunction    Past Surgical History:  Procedure Laterality Date   EYE SURGERY     right eye  cataract surgery with lens implant   NASAL SEPTUM SURGERY     NOSE SURGERY     REVERSE SHOULDER ARTHROPLASTY Right 01/11/2020   Procedure: REVERSE SHOULDER ARTHROPLASTY;  Surgeon: Dozier Soulier, MD;  Location: [facility];  Service: Orthopedics;  Laterality: Right;   SHOULDER ARTHROSCOPY WITH SUBACROMIAL DECOMPRESSION Right 08/07/2019   Procedure: SHOULDER ARTHROSCOPY WITH SUBACROMIAL DECOMPRESSION;  Surgeon: Dozier Soulier, MD;  Location: [facility];  Service: Orthopedics;  Laterality: Right;   SKIN CANCER EXCISION     basal and squamous cell    TONSILLECTOMY     VASECTOMY     WISDOM TOOTH EXTRACTION     Patient Active Problem List   Diagnosis Date Noted   Bursitis of both hips 02/23/2023   Degenerative  arthritis of knee, bilateral 11/08/2022   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   Left knee pain 08/05/2020   Piriformis syndrome of left side 08/18/2019   Rotator cuff tear, right 05/29/2019   Closed fracture of distal clavicle 05/15/2019   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    PCP: Shayne Anes, MD   REFERRING PROVIDER: Claudene Hussar, MD  REFERRING DIAG:  Diagnosis  M17.0 (ICD-10-CM) - Primary osteoarthritis of both knees  M54.16 (ICD-10-CM) - Lumbar radiculitis    THERAPY DIAG:  Chronic pain of left knee  Muscle weakness (generalized)  Other abnormalities of gait and mobility  Chronic pain of right knee  Other low back pain  Rationale for Evaluation and Treatment: Rehabilitation  ONSET DATE: chronic with recent falls   SUBJECTIVE:   SUBJECTIVE STATEMENT: My Lt knee pain is increased over the past few days.    PERTINENT HISTORY: Anxiety, HTN, reverse shoulder replacement (2021), basal cell carcinoma  PAIN: 09/21/24 Are you having pain? Yes: NPRS scale: knee 05/10 Pain location: Lt>Rt knee , low back  Pain description: knees (sharp), low back (dull ache) Aggravating factors: going up steps, standing long periods,  squatting to feed cat  Relieving factors: sitting down, not climbing steps   PRECAUTIONS: Fall  WEIGHT BEARING RESTRICTIONS: No  FALLS:  Has patient fallen in last 6 months? No  LIVING ENVIRONMENT: Lives with: lives alone, recent widower Lives in: House/apartment Stairs: Yes: External: 3 steps; on right going up Has following equipment at home: None  OCCUPATION: retired  PLOF: Independent, Vocation/Vocational requirements: desk work, walking-retired but small amount of work, and Leisure: walking for exercises Pilates every week in a small group, works out with trainer 2x/wk. 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 No acute osseous abnormalities  Right knee:  Degenerative changes. No acute osseous abnormalities.   US  on 12/10/23:  Limited muscular skeletal ultrasound was performed and interpreted by CLAUDENE HUSSAR, M  Left knee does have significant decrease in the hypoechoic changes noted.  Does have narrowing of the patellofemoral joint bilaterally but worsening on the right side. Impression: Worsening effusion of the right knee compared to left  07/12/24: X-ray of hip/pelvis:  IMPRESSION: 1. No evidence of fracture or dislocation. 2. 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 3. 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  COGNITION: Overall cognitive status: Within functional limits for tasks assessed     SENSATION: WFL  MUSCLE LENGTH: Hamstring flexibility limited by 25% bilaterally  POSTURE: rounded shoulders and forward head  PALPATION: Diffuse palpable tenderness in bil quads, and with patellar mobs/compression   LOWER EXTREMITY ROM:  Knee extension limited   LOWER EXTREMITY MMT:  MMT Right eval Left eval  Hip flexion 4 4  Hip extension 4+ 4+  Hip abduction 4 4  Hip adduction    Hip internal rotation    Hip external rotation    Knee flexion 4+ 4+  Knee extension 4+ 4+  Ankle dorsiflexion 5 5  Ankle plantarflexion    Ankle inversion    Ankle eversion     (Blank rows = not tested)   FUNCTIONAL TESTS:  06/19/24: 5x sit to stand: 20.44 seconds   Modified Oswestry: 14/50=28%   07/13/24: 5x sit to stand: 16.76 seconds  08/15/24:  5x sit to stand: 13.95  GAIT: Distance walked: 100 Assistive device utilized: None Level of assistance: Complete Independence Comments: bil knee flexion, wide base of support Steps: negotiating with alternating pattern and use of hands for support, pain with uncontrolled descent with descending   08/29/24: Short Physical Performance Battery:    Balance:       Side by side stance:    points (1)-1  Semi  -tandem:     points (1)-1  Tandem:      points (2)-0    Gait Speed: (58m=9.84 feet) done 2x take the best time:    points                                           Unable=0                                           > 6.52 sec=1 pt  4.66-6.52 sec=2 pt                                           3.62-4.65 sec=3 pt                                          <3.62 sec= 4 pts- 3.12 seconds     Repeated Chair Stands:      points   (timer stopped when straight on 5th stand)                                              If used arms=0 pts                                              > 60 sec=0 pts                                              16.70 or more=1 pt                                               13.70-16.69=2 pts                                               11.20-13.69=3 pts-13.17 seconds                                                11.9 sec or less=4 pts Total score=    9/12  points <10/12 predictive of 1 or more mobility limitations and increased risk of mobility disability 6 or less/12 associated with a higher fall rate                                                 The Patient-Specific Functional Scale  Initial:  I am going to ask you to identify up to 3 important activities that you are unable to do or are having difficulty with as a result of this problem.  Today are there any activities that you are unable to do or having difficulty with because of this?  (Patient shown scale and patient rated each activity)  Follow up: When you first came in you had difficulty performing these activities.  Today do you still have difficulty?  Patient-Specific activity scoring scheme (Point to one number):  0 1 2 3 4 5 6 7 8 9  10 Unable  Able to perform To perform                                                                                                     activity at the same Activity         Level as before                                                                                                                       Injury or problem  Activity  Negotiating obstacles and steps at football stadium                 Initial: 6/10 (08/29/24)                  follow up:  2. Squatting to feed cat                                                                        Initial:    6/10 (08/29/24)                   follow up:                                              TODAY'S TREATMENT:                                                                                                                              DATE:   09/21/24 NuStep: level 7x 6 minutes- PT present to discuss Seated hamstring stretch: 3x20 seconds  Leg Press: seat 9, 90#  x20, unilateral  50# x20 Hip Machine (abduction & flexion,  extension 45#) 2x 15 bilateral  Resisted walking 20# forward and reverse x10 each-supervision by PT for safety Farmer's carry: 15# KB 2 laps around both gyms each hand position Trunk rotation: blue band 2x10 bil each    09/12/24 NuStep: level 7x 6 minutes- PT present to discuss Leg Press: seat 9, 90#  x20, unilateral  50# x20 Hip Machine (abduction & flexion, extension 45#) 2x 15 bilateral  Resisted walking 20# forward and reverse x10 each-close CGA Farmer's carry: 15# KB 2 laps around both gyms each hand position Trunk rotation: blue band 2x10 bil each    08/29/24 NuStep: level 7x 6 minutes- PT present to discuss Objective testing for recert- see above  Leg Press: seat 9, 90#  x20, unilateral  50# x20 Hip Machine (abduction & flexion, extension 45#) 2x 15 bilateral  Resisted walking 20# forward and reverse x10 each-close CGA Discussed safety with walking in [place]- take walking stick due to unlevel surfaces.   PATIENT EDUCATION:  Education details: Access Code: [email or web address], use walking stick for community distances and football  stadiums Person educated: Patient Education method: Explanation, Facilities Manager, and Handouts Education comprehension: verbalized understanding and returned demonstration  HOME EXERCISE PROGRAM: Access Code: [email or web address] URL: [email or web address] Date: 07/13/2024 Prepared by: Burnard  Exercises - Seated Hamstring Stretch  - 3 x daily - 7 x weekly - 1 sets - 3 reps - 20 hold - Supine Lower Trunk Rotation  - 3 x daily - 7 x weekly - 1 sets - 3 reps - 20 hold - Hooklying Single Knee to Chest Stretch  - 3 x daily - 7 x weekly - 1 sets - 3 reps - 20 hold - Seated Figure 4 Piriformis Stretch  - 3 x daily - 7 x weekly - 1 sets - 3 reps - 20 hold - Heel Raises with Counter Support  - 1 x daily - 7 x weekly - 3 sets - 10 reps - Clamshell  - 1 x daily - 7 x weekly - 2 sets - 10 reps - Standing Hip Abduction with Counter Support  - 1 x daily - 7 x weekly - 1-2 sets - 10 reps - Seated Long Arc Quad  - 2 x daily - 7 x weekly - 2 sets - 10 reps - 5 hold - Seated Sciatic Nerve Glide With Cervical Motion  - 2-3 x daily - 7 x weekly - 1 sets - 10 reps  ASSESSMENT:  CLINICAL IMPRESSION: Pt traveled over the weekend and did a lot of driving.  Hasn't been walking as much lately due to time constraints.  He has also not been to the gym this week.  We discussed getting back to a consistent routine to determine the impact on his pain.  No pain with exercises today and less guard required for resisted walking. He will work We will work on functional strength and higher level balance tasks to continue to improve his function and safety and home an in the community.  Patient will benefit from skilled PT to address the below impairments and improve overall function.    OBJECTIVE IMPAIRMENTS: Abnormal gait, decreased activity tolerance, decreased balance, decreased endurance, difficulty walking, decreased strength, decreased safety awareness, increased muscle spasms, impaired flexibility, improper body  mechanics, and pain.   ACTIVITY LIMITATIONS: carrying, lifting, standing, squatting, stairs, transfers, and locomotion level  PARTICIPATION LIMITATIONS: cleaning, laundry, community activity, and yard work  PERSONAL FACTORS: Age, Time since onset of injury/illness/exacerbation, and 1 comorbidity: OA are also affecting patient's functional  outcome.   REHAB POTENTIAL: Good  CLINICAL DECISION MAKING: Evolving/moderate complexity  EVALUATION COMPLEXITY: Moderate   GOALS: Goals reviewed with patient? Yes  SHORT TERM GOALS: Target date: 07/17/2024     Be independent in initial HEP Baseline: consistent with HEP Goal status: MET  2.  Perform 5x sit to stand in < 12 seconds without UE  support to reduce falls risk Baseline: 13.95 (08/15/24) Goal status: REVISED  3.  Improve bil knee strength and stability to ascend 8 with use of 1 rail for safety in negotiating steps at high school football games Baseline: working on this and using walking stick (07/04/24) Goal status: IN PROGRESS  4.  Stand > or = to 30 min without limitation due to LBP.   Baseline: no limitations (07/25/24)  Goal Status: MET  LONG TERM GOALS: Target date: 10/24/2024   Be independent in advanced HEP Baseline:  Goal status: IN PROGRESS  2.  Improve score on short performance physical battery to > or = to 11/12 Baseline: 9/12  Goal status: INITIAL  3.  Improve Modified Oswestry to < or = to 18% disability  Baseline: 28% disability  Goal status: IN PROGRESS  4. Improve PSFS for squatting to feed cat to > or = to 7/10 due to improved LE strength Baseline: 6/10 Goal status: INITIAL  5.  Improve PSFS for negotiating stadium steps to > or = to 7/10 Baseline: 6/10 (08/29/24) Goal status: INITIAL PLAN:  PT FREQUENCY: 1-2x/week  PT DURATION:  8 weeks  PLANNED INTERVENTIONS: Therapeutic exercises, Therapeutic activity, Neuromuscular re-education, Balance training, Gait training, Patient/Family education,  Self Care, Joint mobilization, Stair training, Vestibular training, Aquatic Therapy, Dry Needling, Cryotherapy, Moist heat, Manual therapy, and Re-evaluation  PLAN FOR NEXT SESSION: Quad and glute strength, balance tasks, hip and knee flexibility, functional mobility, balance.      Burnard Joy, PT 09/21/24 10:24 AM   [facility] [address], [address] [place], [place] [address] Phone # [phone] Fax [phone]

## 2024-09-22 ENCOUNTER — Other Ambulatory Visit (HOSPITAL_COMMUNITY): Payer: Self-pay

## 2024-09-22 MED ORDER — LORAZEPAM 0.5 MG PO TABS
0.5000 mg | ORAL_TABLET | Freq: Two times a day (BID) | ORAL | 0 refills | Status: AC | PRN
  Filled 2024-09-22: qty 20, 10d supply, fill #0

## 2024-09-25 ENCOUNTER — Encounter: Payer: Self-pay | Admitting: Cardiology

## 2024-09-25 ENCOUNTER — Ambulatory Visit: Attending: Cardiology | Admitting: Cardiology

## 2024-09-25 ENCOUNTER — Other Ambulatory Visit (HOSPITAL_COMMUNITY): Payer: Self-pay

## 2024-09-25 VITALS — BP 170/88 | HR 66 | Ht 74.0 in | Wt 214.0 lb

## 2024-09-25 DIAGNOSIS — I2510 Atherosclerotic heart disease of native coronary artery without angina pectoris: Secondary | ICD-10-CM | POA: Insufficient documentation

## 2024-09-25 DIAGNOSIS — R011 Cardiac murmur, unspecified: Secondary | ICD-10-CM | POA: Insufficient documentation

## 2024-09-25 DIAGNOSIS — I350 Nonrheumatic aortic (valve) stenosis: Secondary | ICD-10-CM | POA: Insufficient documentation

## 2024-09-25 DIAGNOSIS — E7800 Pure hypercholesterolemia, unspecified: Secondary | ICD-10-CM | POA: Insufficient documentation

## 2024-09-25 DIAGNOSIS — I10 Essential (primary) hypertension: Secondary | ICD-10-CM | POA: Insufficient documentation

## 2024-09-25 MED ORDER — AMLODIPINE BESYLATE 5 MG PO TABS
5.0000 mg | ORAL_TABLET | Freq: Every day | ORAL | 3 refills | Status: AC
  Filled 2024-09-25: qty 90, 90d supply, fill #0

## 2024-09-25 NOTE — Patient Instructions (Signed)
 Medication Instructions:   START AMLODIPINE  5 MG ONCE DAILY  *If you need a refill on your cardiac medications before your next appointment, please call your pharmacy*  Testing/Procedures: Your physician has requested that you have an echocardiogram. Echocardiography is a painless test that uses sound waves to create images of your heart. It provides your doctor with information about the size and shape of your heart and how well your heart's chambers and valves are working. This procedure takes approximately one hour. There are no restrictions for this procedure. Please do NOT wear cologne, perfume, aftershave, or lotions (deodorant is allowed). Please arrive 15 minutes prior to your appointment time.  Please note: We ask at that you not bring children with you during ultrasound (echo/ vascular) testing. Due to room size and safety concerns, children are not allowed in the ultrasound rooms during exams. Our front office staff cannot provide observation of children in our lobby area while testing is being conducted. An adult accompanying a patient to their appointment will only be allowed in the ultrasound room at the discretion of the ultrasound technician under special circumstances. We apologize for any inconvenience. [address]  Follow-Up: At [facility], you and your health needs are our priority.  As part of our continuing mission to provide you with exceptional heart care, our providers are all part of one team.  This team includes your primary Cardiologist (physician) and Advanced Practice Providers or APPs (Physician Assistants and Nurse Practitioners) who all work together to provide you with the care you need, when you need it.  Your next appointment:   12 month(s)  Provider:   Redell Shallow, MD

## 2024-09-26 ENCOUNTER — Ambulatory Visit

## 2024-09-26 ENCOUNTER — Ambulatory Visit (HOSPITAL_COMMUNITY)
Admission: RE | Admit: 2024-09-26 | Discharge: 2024-09-26 | Disposition: A | Discharge status: Home / Self Care | Source: Ambulatory Visit | Attending: Internal Medicine | Admitting: Internal Medicine

## 2024-09-26 DIAGNOSIS — R2689 Other abnormalities of gait and mobility: Secondary | ICD-10-CM

## 2024-09-26 DIAGNOSIS — F4323 Adjustment disorder with mixed anxiety and depressed mood: Secondary | ICD-10-CM | POA: Diagnosis not present

## 2024-09-26 DIAGNOSIS — M6281 Muscle weakness (generalized): Secondary | ICD-10-CM | POA: Diagnosis not present

## 2024-09-26 DIAGNOSIS — M5459 Other low back pain: Secondary | ICD-10-CM

## 2024-09-26 DIAGNOSIS — M25561 Pain in right knee: Secondary | ICD-10-CM | POA: Diagnosis not present

## 2024-09-26 DIAGNOSIS — R011 Cardiac murmur, unspecified: Secondary | ICD-10-CM | POA: Diagnosis not present

## 2024-09-26 DIAGNOSIS — G8929 Other chronic pain: Secondary | ICD-10-CM | POA: Diagnosis not present

## 2024-09-26 DIAGNOSIS — M25562 Pain in left knee: Secondary | ICD-10-CM | POA: Diagnosis not present

## 2024-09-26 NOTE — Therapy (Signed)
 OUTPATIENT PHYSICAL THERAPY TREATMENT   Patient Name: Cory Barnett MRN: [id number] DOB:[birth year], 78 y.o., male Today's Date: 09/26/2024   Progress Note Reporting Period 08/03/24 to 09/26/24  See note below for Objective Data and Assessment of Progress/Goals.     END OF SESSION:  PT End of Session - 09/26/24 1100     Visit Number 20    Date for Recertification  10/24/24    Authorization Type Medicare B- KX modifier now    Progress Note Due on Visit 20    PT Start Time 1016    PT Stop Time 1100    PT Time Calculation (min) 44 min    Activity Tolerance Patient tolerated treatment well    Behavior During Therapy WFL for tasks assessed/performed                              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   IFG (impaired fasting glucose) 01/2016   Pre-diabetes    RBBB (right bundle branch block)    Rheumatic fever    age 28 or 89, in hospital for a month, out of school for a yr   Seasonal allergies    Squamous cell carcinoma    Testicular hypofunction    Past Surgical History:  Procedure Laterality Date   EYE SURGERY     right eye  cataract surgery with lens implant   NASAL SEPTUM SURGERY     NOSE SURGERY     REVERSE SHOULDER ARTHROPLASTY Right 01/11/2020   Procedure: REVERSE SHOULDER ARTHROPLASTY;  Surgeon: Dozier Soulier, MD;  Location: [facility];  Service: Orthopedics;  Laterality: Right;   SHOULDER ARTHROSCOPY WITH SUBACROMIAL DECOMPRESSION Right 08/07/2019   Procedure: SHOULDER ARTHROSCOPY WITH SUBACROMIAL DECOMPRESSION;  Surgeon: Dozier Soulier, MD;  Location: [facility];  Service: Orthopedics;  Laterality: Right;   SKIN CANCER EXCISION     basal and squamous cell    TONSILLECTOMY     VASECTOMY     WISDOM  TOOTH EXTRACTION     Patient Active Problem List   Diagnosis Date Noted   Bursitis of both hips 02/23/2023   Degenerative arthritis of knee, bilateral 11/08/2022   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   Left knee pain 08/05/2020   Piriformis syndrome of left side 08/18/2019   Rotator cuff tear, right 05/29/2019   Closed fracture of distal clavicle 05/15/2019   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    PCP: Shayne Anes, MD   REFERRING PROVIDER: Claudene Hussar, MD  REFERRING DIAG:  Diagnosis  M17.0 (ICD-10-CM) - Primary osteoarthritis of both knees  M54.16 (ICD-10-CM) - Lumbar radiculitis    THERAPY DIAG:  Chronic pain of left knee  Muscle weakness (generalized)  Other abnormalities of gait and mobility  Chronic pain of right knee  Other low back pain  Rationale for Evaluation and Treatment: Rehabilitation  ONSET DATE: chronic with recent falls   SUBJECTIVE:   SUBJECTIVE STATEMENT: I got back to the gym and pilates.   My knee feels better.    PERTINENT HISTORY: Anxiety, HTN, reverse shoulder replacement (2021), basal cell carcinoma  PAIN: 09/21/24 Are you having pain? Yes: NPRS scale: knee  5/10 Pain location: Lt>Rt knee , low back  Pain description: knees (sharp), low back (dull ache) Aggravating factors: going up steps, standing long periods, squatting to feed cat  Relieving factors: sitting down, not climbing steps   PRECAUTIONS: Fall  WEIGHT BEARING RESTRICTIONS: No  FALLS:  Has patient fallen in last 6 months? No  LIVING ENVIRONMENT: Lives with: lives alone, recent widower Lives in: House/apartment Stairs: Yes: External: 3 steps; on right going up Has following equipment at home: None  OCCUPATION: retired  PLOF: Independent, Vocation/Vocational requirements: desk work, walking-retired but small amount of work, and Leisure: walking for exercises Pilates every week in a small group, works out with trainer 2x/wk. 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 No acute osseous abnormalities  Right knee:  Degenerative changes. No acute osseous abnormalities.   US  on 12/10/23:  Limited muscular skeletal ultrasound was performed and interpreted by CLAUDENE HUSSAR, M  Left knee does have significant decrease in the hypoechoic changes noted.  Does have narrowing of the patellofemoral joint bilaterally but worsening on the right side. Impression: Worsening effusion of the right knee compared to left  07/12/24: X-ray of hip/pelvis:  IMPRESSION: 1. No evidence of fracture or dislocation. 2. 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 3. 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  COGNITION: Overall cognitive status: Within functional limits for tasks assessed     SENSATION: WFL  MUSCLE LENGTH: Hamstring flexibility limited by 25% bilaterally  POSTURE: rounded shoulders and forward head  PALPATION: Diffuse palpable tenderness in bil quads, and with patellar mobs/compression   LOWER EXTREMITY ROM:  Knee extension limited   LOWER EXTREMITY MMT:  MMT Right eval Left eval  Hip flexion 4 4  Hip extension 4+ 4+  Hip abduction 4 4  Hip adduction    Hip internal rotation    Hip external rotation    Knee flexion 4+ 4+  Knee extension 4+ 4+  Ankle dorsiflexion 5 5  Ankle plantarflexion    Ankle inversion    Ankle eversion     (Blank rows = not tested)   FUNCTIONAL TESTS:  06/19/24: 5x sit to stand: 20.44 seconds   Modified Oswestry: 14/50=28%   07/13/24: 5x sit to stand: 16.76 seconds  08/15/24:  5x sit to stand: 13.95  GAIT: Distance walked: 100 Assistive device utilized: None Level of assistance: Complete Independence Comments: bil knee flexion, wide base of support Steps: negotiating with alternating pattern and use of hands for support, pain with uncontrolled  descent with descending   08/29/24: Short Physical Performance Battery:    Balance:       Side by side stance:    points (1)-1  Semi -tandem:     points (1)-1  Tandem:      points (2)-0    Gait Speed: (16m=9.84 feet) done 2x take the best time:    points                                           Unable=0                                           >  6.52 sec=1 pt                                           4.66-6.52 sec=2 pt                                           3.62-4.65 sec=3 pt                                          <3.62 sec= 4 pts- 3.12 seconds     Repeated Chair Stands:      points   (timer stopped when straight on 5th stand)                                              If used arms=0 pts                                              > 60 sec=0 pts                                              16.70 or more=1 pt                                               13.70-16.69=2 pts                                               11.20-13.69=3 pts-13.17 seconds                                                11.9 sec or less=4 pts Total score=    9/12  points <10/12 predictive of 1 or more mobility limitations and increased risk of mobility disability 6 or less/12 associated with a higher fall rate                                                 The Patient-Specific Functional Scale  Initial:  I am going to ask you to identify up to 3 important activities that you are unable to do or are having difficulty with as a result of this problem.  Today are there any activities that you are unable to do or having difficulty with because of this?  (Patient shown scale and patient rated  each activity)  Follow up: When you first came in you had difficulty performing these activities.  Today do you still have difficulty?  Patient-Specific activity scoring scheme (Point to one number):  0 1 2 3 4 5 6 7 8 9  10 Unable                                                                                                           Able to perform To perform                                                                                                    activity at the same Activity         Level as before                                                                                                                       Injury or problem  Activity  Negotiating obstacles and steps at football stadium                 Initial: 6/10 (08/29/24)                  follow up: 7/10 (09/26/24)  2. Squatting to feed cat                                                                        Initial:    6/10 (08/29/24)                   follow up: 6.5/10 (09/26/24)                                              TODAY'S TREATMENT:  DATE:   09/26/24 NuStep: level 4 (blue machine)x 6 minutes- PT present to discuss Seated hamstring stretch: 3x20 seconds  Lunge on to [employer]: using poles x15 each  Leg Press: seat 9, 90#  x20, unilateral  50# 3x10 Hip Machine (abduction & flexion, extension 45#) 2x 15 bilateral  Resisted walking 20# forward and reverse x10 each-supervision by PT for safety Farmer's carry: 15# KB 2 laps around both gyms each hand position Modified dead lift: 20# 2x10   October 21, 2024 NuStep: level 7x 6 minutes- PT present to discuss Seated hamstring stretch: 3x20 seconds  Leg Press: seat 9, 90#  x20, unilateral  50# x20 Hip Machine (abduction & flexion, extension 45#) 2x 15 bilateral  Resisted walking 20# forward and reverse x10 each-supervision by PT for safety Farmer's carry: 15# KB 2 laps around both gyms each hand position Trunk rotation: blue band 2x10 bil each    09/12/24 NuStep: level 7x 6 minutes- PT present to discuss Leg Press: seat 9, 90#  x20, unilateral  50# x20 Hip Machine (abduction & flexion, extension 45#) 2x 15 bilateral  Resisted walking 20# forward  and reverse x10 each-close CGA Farmer's carry: 15# KB 2 laps around both gyms each hand position Trunk rotation: blue band 2x10 bil each    PATIENT EDUCATION:  Education details: Access Code: [email or web address], use walking stick for community distances and football stadiums Person educated: Patient Education method: Programmer, Multimedia, Facilities Manager, and Handouts Education comprehension: verbalized understanding and returned demonstration  HOME EXERCISE PROGRAM: Access Code: [email or web address] URL: [email or web address] Date: 07/13/2024 Prepared by: Burnard  Exercises - Seated Hamstring Stretch  - 3 x daily - 7 x weekly - 1 sets - 3 reps - 20 hold - Supine Lower Trunk Rotation  - 3 x daily - 7 x weekly - 1 sets - 3 reps - 20 hold - Hooklying Single Knee to Chest Stretch  - 3 x daily - 7 x weekly - 1 sets - 3 reps - 20 hold - Seated Figure 4 Piriformis Stretch  - 3 x daily - 7 x weekly - 1 sets - 3 reps - 20 hold - Heel Raises with Counter Support  - 1 x daily - 7 x weekly - 3 sets - 10 reps - Clamshell  - 1 x daily - 7 x weekly - 2 sets - 10 reps - Standing Hip Abduction with Counter Support  - 1 x daily - 7 x weekly - 1-2 sets - 10 reps - Seated Long Arc Quad  - 2 x daily - 7 x weekly - 2 sets - 10 reps - 5 hold - Seated Sciatic Nerve Glide With Cervical Motion  - 2-3 x daily - 7 x weekly - 1 sets - 10 reps  ASSESSMENT:  CLINICAL IMPRESSION: Pt has resumed his regular gym exercises.  He did well with advancement of activity and has improved scores on PSFS due to improved functional strength and improved mobility. Less guarding required with balance tasks in the clinic today. He will work We will work on functional strength and higher level balance tasks to continue to improve his function and safety and home an in the community.  Patient will benefit from skilled PT to address the below impairments and improve overall function.    OBJECTIVE IMPAIRMENTS: Abnormal gait, decreased activity  tolerance, decreased balance, decreased endurance, difficulty walking, decreased strength, decreased safety awareness, increased muscle spasms, impaired flexibility, improper body mechanics, and pain.   ACTIVITY LIMITATIONS: carrying, lifting, standing, squatting, stairs, transfers, and  locomotion level  PARTICIPATION LIMITATIONS: cleaning, laundry, community activity, and yard work  PERSONAL FACTORS: Age, Time since onset of injury/illness/exacerbation, and 1 comorbidity: OA are also affecting patient's functional outcome.   REHAB POTENTIAL: Good  CLINICAL DECISION MAKING: Evolving/moderate complexity  EVALUATION COMPLEXITY: Moderate   GOALS: Goals reviewed with patient? Yes  SHORT TERM GOALS: Target date: 07/17/2024     Be independent in initial HEP Baseline: consistent with HEP Goal status: MET  2.  Perform 5x sit to stand in < 12 seconds without UE  support to reduce falls risk Baseline: 13.95 (08/15/24) Goal status: REVISED  3.  Improve bil knee strength and stability to ascend 8 with use of 1 rail for safety in negotiating steps at high school football games Baseline: working on this and using walking stick (07/04/24) Goal status: IN PROGRESS  4.  Stand > or = to 30 min without limitation due to LBP.   Baseline: no limitations (07/25/24)  Goal Status: MET  LONG TERM GOALS: Target date: 10/24/2024   Be independent in advanced HEP Baseline:  Goal status: IN PROGRESS  2.  Improve score on short performance physical battery to > or = to 11/12 Baseline: 9/12  Goal status: INITIAL  3.  Improve Modified Oswestry to < or = to 18% disability  Baseline: 28% disability  Goal status: IN PROGRESS  4. Improve PSFS for squatting to feed cat to > or = to 7/10 due to improved LE strength Baseline: 6/10 Goal status: INITIAL  5.  Improve PSFS for negotiating stadium steps to > or = to 7/10 Baseline: 6/10 (08/29/24) Goal status: INITIAL PLAN:  PT FREQUENCY: 1-2x/week  PT  DURATION:  8 weeks  PLANNED INTERVENTIONS: Therapeutic exercises, Therapeutic activity, Neuromuscular re-education, Balance training, Gait training, Patient/Family education, Self Care, Joint mobilization, Stair training, Vestibular training, Aquatic Therapy, Dry Needling, Cryotherapy, Moist heat, Manual therapy, and Re-evaluation  PLAN FOR NEXT SESSION: Quad and glute strength, balance tasks, hip and knee flexibility, functional mobility, balance.      Burnard Joy, PT 09/26/24 11:04 AM   [facility] [address] [place], [place] [address] Phone # [phone] Fax [phone]

## 2024-09-27 ENCOUNTER — Ambulatory Visit: Payer: Self-pay | Admitting: Cardiology

## 2024-09-27 LAB — ECHOCARDIOGRAM COMPLETE
AR max vel: 2.95 cm2
AV Area VTI: 2.82 cm2
AV Area mean vel: 2.74 cm2
AV Mean grad: 10 mmHg
AV Peak grad: 17.6 mmHg
Ao pk vel: 2.1 m/s
Area-P 1/2: 3 cm2
S' Lateral: 3.03 cm

## 2024-10-03 ENCOUNTER — Ambulatory Visit: Attending: Family Medicine

## 2024-10-03 DIAGNOSIS — M5459 Other low back pain: Secondary | ICD-10-CM | POA: Diagnosis present

## 2024-10-03 DIAGNOSIS — M6281 Muscle weakness (generalized): Secondary | ICD-10-CM | POA: Insufficient documentation

## 2024-10-03 DIAGNOSIS — M25561 Pain in right knee: Secondary | ICD-10-CM | POA: Insufficient documentation

## 2024-10-03 DIAGNOSIS — G8929 Other chronic pain: Secondary | ICD-10-CM | POA: Diagnosis present

## 2024-10-03 DIAGNOSIS — M25562 Pain in left knee: Secondary | ICD-10-CM | POA: Insufficient documentation

## 2024-10-03 DIAGNOSIS — R2689 Other abnormalities of gait and mobility: Secondary | ICD-10-CM | POA: Insufficient documentation

## 2024-10-03 NOTE — Therapy (Signed)
 OUTPATIENT PHYSICAL THERAPY TREATMENT   Patient Name: Cory Barnett MRN: [id number] DOB:January 21, 1946, 78 y.o., male Today's Date: 10/03/2024     END OF SESSION:  PT End of Session - 10/03/24 1013     Visit Number 21    Date for Recertification  10/24/24    Authorization Type Medicare B- KX modifier now    Progress Note Due on Visit 20    PT Start Time 0932    PT Stop Time 1013    PT Time Calculation (min) 41 min    Activity Tolerance Patient tolerated treatment well    Behavior During Therapy WFL for tasks assessed/performed                               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   IFG (impaired fasting glucose) 01/2016   Pre-diabetes    RBBB (right bundle branch block)    Rheumatic fever    age 49 or 56, in hospital for a month, out of school for a yr   Seasonal allergies    Squamous cell carcinoma    Testicular hypofunction    Past Surgical History:  Procedure Laterality Date   EYE SURGERY     right eye  cataract surgery with lens implant   NASAL SEPTUM SURGERY     NOSE SURGERY     REVERSE SHOULDER ARTHROPLASTY Right 01/11/2020   Procedure: REVERSE SHOULDER ARTHROPLASTY;  Surgeon: Dozier Soulier, MD;  Location: [facility];  Service: Orthopedics;  Laterality: Right;   SHOULDER ARTHROSCOPY WITH SUBACROMIAL DECOMPRESSION Right 08/07/2019   Procedure: SHOULDER ARTHROSCOPY WITH SUBACROMIAL DECOMPRESSION;  Surgeon: Dozier Soulier, MD;  Location: [facility];  Service: Orthopedics;  Laterality: Right;   SKIN CANCER EXCISION     basal and squamous cell    TONSILLECTOMY     VASECTOMY     WISDOM TOOTH EXTRACTION     Patient Active Problem List   Diagnosis Date Noted   Bursitis of both hips 02/23/2023   Degenerative  arthritis of knee, bilateral 11/08/2022   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   Left knee pain 08/05/2020   Piriformis syndrome of left side 08/18/2019   Rotator cuff tear, right 05/29/2019   Closed fracture of distal clavicle 05/15/2019   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    PCP: Shayne Anes, MD   REFERRING PROVIDER: Claudene Hussar, MD  REFERRING DIAG:  Diagnosis  M17.0 (ICD-10-CM) - Primary osteoarthritis of both knees  M54.16 (ICD-10-CM) - Lumbar radiculitis    THERAPY DIAG:  Chronic pain of left knee  Muscle weakness (generalized)  Other abnormalities of gait and mobility  Rationale for Evaluation and Treatment: Rehabilitation  ONSET DATE: chronic with recent falls   SUBJECTIVE:   SUBJECTIVE STATEMENT: My knees did well on the trip that I was on.  I haven't done my usual walking.   PERTINENT HISTORY: Anxiety, HTN, reverse shoulder replacement (2021), basal cell carcinoma  PAIN: 10/02/24 Are you having pain? Yes: NPRS scale: knee 5/10 Pain location: Lt>Rt knee , low back  Pain description: knees (sharp), low back (dull ache) Aggravating factors: going up steps, standing long periods, squatting to  feed cat  Relieving factors: sitting down, not climbing steps   PRECAUTIONS: Fall  WEIGHT BEARING RESTRICTIONS: No  FALLS:  Has patient fallen in last 6 months? No  LIVING ENVIRONMENT: Lives with: lives alone, recent widower Lives in: House/apartment Stairs: Yes: External: 3 steps; on right going up Has following equipment at home: None  OCCUPATION: retired  PLOF: Independent, Vocation/Vocational requirements: desk work, walking-retired but small amount of work, and Leisure: walking for exercises Pilates every week in a small group, works out with trainer 2x/wk. 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  No acute osseous abnormalities  Right knee:  Degenerative changes. No acute osseous abnormalities.   US  on 12/10/23:  Limited muscular skeletal ultrasound was performed and interpreted by CLAUDENE HUSSAR, M  Left knee does have significant decrease in the hypoechoic changes noted.  Does have narrowing of the patellofemoral joint bilaterally but worsening on the right side. Impression: Worsening effusion of the right knee compared to left  07/12/24: X-ray of hip/pelvis:  IMPRESSION: 1. No evidence of fracture or dislocation. 2. 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 3. 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  COGNITION: Overall cognitive status: Within functional limits for tasks assessed     SENSATION: WFL  MUSCLE LENGTH: Hamstring flexibility limited by 25% bilaterally  POSTURE: rounded shoulders and forward head  PALPATION: Diffuse palpable tenderness in bil quads, and with patellar mobs/compression   LOWER EXTREMITY ROM:  Knee extension limited   LOWER EXTREMITY MMT:  MMT Right eval Left eval  Hip flexion 4 4  Hip extension 4+ 4+  Hip abduction 4 4  Hip adduction    Hip internal rotation    Hip external rotation    Knee flexion 4+ 4+  Knee extension 4+ 4+  Ankle dorsiflexion 5 5  Ankle plantarflexion    Ankle inversion    Ankle eversion     (Blank rows = not tested)   FUNCTIONAL TESTS:  06/19/24: 5x sit to stand: 20.44 seconds   Modified Oswestry: 14/50=28%   07/13/24: 5x sit to stand: 16.76 seconds  08/15/24:  5x sit to stand: 13.95  GAIT: Distance walked: 100 Assistive device utilized: None Level of assistance: Complete Independence Comments: bil knee flexion, wide base of support Steps: negotiating with alternating pattern and use of hands for support, pain with uncontrolled descent with descending   08/29/24: Short Physical Performance Battery:    Balance:       Side by side stance:    points (1)-1  Semi -tandem:     points  (1)-1  Tandem:      points (2)-0    Gait Speed: (15m=9.84 feet) done 2x take the best time:    points                                           Unable=0                                           > 6.52 sec=1 pt  4.66-6.52 sec=2 pt                                           3.62-4.65 sec=3 pt                                          <3.62 sec= 4 pts- 3.12 seconds     Repeated Chair Stands:      points   (timer stopped when straight on 5th stand)                                              If used arms=0 pts                                              > 60 sec=0 pts                                              16.70 or more=1 pt                                               13.70-16.69=2 pts                                               11.20-13.69=3 pts-13.17 seconds                                                11.9 sec or less=4 pts Total score=    9/12  points <10/12 predictive of 1 or more mobility limitations and increased risk of mobility disability 6 or less/12 associated with a higher fall rate                                                 The Patient-Specific Functional Scale  Initial:  I am going to ask you to identify up to 3 important activities that you are unable to do or are having difficulty with as a result of this problem.  Today are there any activities that you are unable to do or having difficulty with because of this?  (Patient shown scale and patient rated each activity)  Follow up: When you first came in you had difficulty performing these activities.  Today do you still have difficulty?  Patient-Specific activity scoring scheme (Point to one number):  0 1 2 3 4 5 6 7 8 9  10 Unable  Able to perform To perform                                                                                                    activity at  the same Activity         Level as before                                                                                                                       Injury or problem  Activity  Negotiating obstacles and steps at football stadium                 Initial: 6/10 (08/29/24)                  follow up: 7/10 (09/26/24)  2. Squatting to feed cat                                                                        Initial:    6/10 (08/29/24)                   follow up: 6.5/10 (09/26/24)                                              TODAY'S TREATMENT:                                                                                                                              DATE:   10/03/24 NuStep: level 4 (blue machine)x 6 minutes- PT present to discuss Seated hamstring stretch: 3x20 seconds  Lunge on to [employer]: using poles x15 each  Leg Press: seat 9, 90#  x20, unilateral  50# 3x10 Hip Machine (abduction & flexion, extension 45#) 2x 15 bilateral  Resisted walking 20# forward and reverse x10 each-supervision by PT for safety Farmer's carry: 15# KB 2 laps around both gyms each hand position Modified dead lift: 20# 2x10   10/17/2024 NuStep: level 4 (blue machine)x 6 minutes- PT present to discuss Seated hamstring stretch: 3x20 seconds  Lunge on to [employer]: using poles x15 each  Leg Press: seat 9, 90#  x20, unilateral  50# 3x10 Hip Machine (abduction & flexion, extension 45#) 2x 15 bilateral  Resisted walking 20# forward and reverse x10 each-supervision by PT for safety Farmer's carry: 15# KB 2 laps around both gyms each hand position Modified dead lift: 20# 2x10   October 12, 2024 NuStep: level 7x 6 minutes- PT present to discuss Seated hamstring stretch: 3x20 seconds  Leg Press: seat 9, 90#  x20, unilateral  50# x20 Hip Machine (abduction & flexion, extension 45#) 2x 15 bilateral  Resisted walking 20# forward and reverse x10 each-supervision by PT for safety Farmer's carry:  15# KB 2 laps around both gyms each hand position Trunk rotation: blue band 2x10 bil each     PATIENT EDUCATION:  Education details: Access Code: [email or web address], use walking stick for community distances and football stadiums Person educated: Patient Education method: Programmer, Multimedia, Facilities Manager, and Handouts Education comprehension: verbalized understanding and returned demonstration  HOME EXERCISE PROGRAM: Access Code: [email or web address] URL: [email or web address] Date: 07/13/2024 Prepared by: Burnard  Exercises - Seated Hamstring Stretch  - 3 x daily - 7 x weekly - 1 sets - 3 reps - 20 hold - Supine Lower Trunk Rotation  - 3 x daily - 7 x weekly - 1 sets - 3 reps - 20 hold - Hooklying Single Knee to Chest Stretch  - 3 x daily - 7 x weekly - 1 sets - 3 reps - 20 hold - Seated Figure 4 Piriformis Stretch  - 3 x daily - 7 x weekly - 1 sets - 3 reps - 20 hold - Heel Raises with Counter Support  - 1 x daily - 7 x weekly - 3 sets - 10 reps - Clamshell  - 1 x daily - 7 x weekly - 2 sets - 10 reps - Standing Hip Abduction with Counter Support  - 1 x daily - 7 x weekly - 1-2 sets - 10 reps - Seated Long Arc Quad  - 2 x daily - 7 x weekly - 2 sets - 10 reps - 5 hold - Seated Sciatic Nerve Glide With Cervical Motion  - 2-3 x daily - 7 x weekly - 1 sets - 10 reps  ASSESSMENT:  CLINICAL IMPRESSION: Pt was traveling over the past weekend.  He will resume his regular exercise and walking routine this week.  Less guarding required with balance tasks in the clinic overall due to improved stability.   We will work on functional strength and higher level balance tasks to continue to improve his function and safety and home an in the community.  Patient will benefit from skilled PT to address the below impairments and improve overall function.    OBJECTIVE IMPAIRMENTS: Abnormal gait, decreased activity tolerance, decreased balance, decreased endurance, difficulty walking, decreased strength, decreased  safety awareness, increased muscle spasms, impaired flexibility, improper body mechanics, and pain.   ACTIVITY LIMITATIONS: carrying, lifting, standing, squatting, stairs, transfers, and locomotion level  PARTICIPATION LIMITATIONS: cleaning, laundry, community activity, and yard work  PERSONAL FACTORS: Age, Time since  onset of injury/illness/exacerbation, and 1 comorbidity: OA are also affecting patient's functional outcome.   REHAB POTENTIAL: Good  CLINICAL DECISION MAKING: Evolving/moderate complexity  EVALUATION COMPLEXITY: Moderate   GOALS: Goals reviewed with patient? Yes  SHORT TERM GOALS: Target date: 07/17/2024     Be independent in initial HEP Baseline: consistent with HEP Goal status: MET  2.  Perform 5x sit to stand in < 12 seconds without UE  support to reduce falls risk Baseline: 13.95 (08/15/24) Goal status: REVISED  3.  Improve bil knee strength and stability to ascend 8 with use of 1 rail for safety in negotiating steps at high school football games Baseline: working on this and using walking stick (07/04/24) Goal status: IN PROGRESS  4.  Stand > or = to 30 min without limitation due to LBP.   Baseline: no limitations (07/25/24)  Goal Status: MET  LONG TERM GOALS: Target date: 10/24/2024   Be independent in advanced HEP Baseline:  Goal status: IN PROGRESS  2.  Improve score on short performance physical battery to > or = to 11/12 Baseline: 9/12  Goal status: INITIAL  3.  Improve Modified Oswestry to < or = to 18% disability  Baseline: 28% disability  Goal status: IN PROGRESS  4. Improve PSFS for squatting to feed cat to > or = to 7/10 due to improved LE strength Baseline: 6/10 Goal status: INITIAL  5.  Improve PSFS for negotiating stadium steps to > or = to 7/10 Baseline: 6/10 (08/29/24) Goal status: INITIAL PLAN:  PT FREQUENCY: 1-2x/week  PT DURATION:  8 weeks  PLANNED INTERVENTIONS: Therapeutic exercises, Therapeutic activity,  Neuromuscular re-education, Balance training, Gait training, Patient/Family education, Self Care, Joint mobilization, Stair training, Vestibular training, Aquatic Therapy, Dry Needling, Cryotherapy, Moist heat, Manual therapy, and Re-evaluation  PLAN FOR NEXT SESSION: Quad and glute strength, balance tasks, hip and knee flexibility, functional mobility, balance.      Burnard Joy, PT 10/03/24 10:15 AM   [facility] [address], [address] [place], [place] [address] Phone # [phone] Fax [phone]

## 2024-10-04 ENCOUNTER — Other Ambulatory Visit (HOSPITAL_COMMUNITY): Payer: Self-pay

## 2024-10-04 NOTE — Telephone Encounter (Signed)
 Letter of results sent to pt

## 2024-10-05 ENCOUNTER — Other Ambulatory Visit (HOSPITAL_COMMUNITY): Payer: Self-pay

## 2024-10-05 MED ORDER — HYDROCHLOROTHIAZIDE 25 MG PO TABS
25.0000 mg | ORAL_TABLET | Freq: Every morning | ORAL | 3 refills | Status: AC
  Filled 2024-10-05: qty 90, 90d supply, fill #0
  Filled 2024-11-16 – 2024-12-04 (×4): qty 90, 90d supply, fill #1

## 2024-10-10 ENCOUNTER — Encounter: Payer: Self-pay | Admitting: *Deleted

## 2024-10-10 ENCOUNTER — Ambulatory Visit

## 2024-10-10 DIAGNOSIS — M5459 Other low back pain: Secondary | ICD-10-CM

## 2024-10-10 DIAGNOSIS — F4323 Adjustment disorder with mixed anxiety and depressed mood: Secondary | ICD-10-CM | POA: Diagnosis not present

## 2024-10-10 DIAGNOSIS — M25562 Pain in left knee: Secondary | ICD-10-CM | POA: Diagnosis not present

## 2024-10-10 DIAGNOSIS — M6281 Muscle weakness (generalized): Secondary | ICD-10-CM

## 2024-10-10 DIAGNOSIS — G8929 Other chronic pain: Secondary | ICD-10-CM

## 2024-10-10 DIAGNOSIS — R2689 Other abnormalities of gait and mobility: Secondary | ICD-10-CM

## 2024-10-10 NOTE — Progress Notes (Unsigned)
 Cory Barnett [facility] [address] [place] [address] Phone: [phone] Subjective:    I'm seeing this patient by the request  of:  Shayne Anes, MD  CC:   YEP:Dlagzrupcz  07/12/2024 Worsening lumbar radiculopathy, patient's symptoms are consistent with more of a spinal stenosis.  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  Do feel an MRI would be highly appropriate at this point.  This is starting to affect daily activities, missing things with family like it is grandson's football games.  He would be a candidate for potential epidurals.  Patient will get an MRI but due to such claustrophobia we will send in Valium  and try an open MRI.  Follow-up with me again after imaging to discuss further.      Update 10/11/2024 Cory Barnett is a 78 y.o. male coming in with complaint of B knee, B hip and lower back pain. 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   IFG (impaired fasting glucose) 01/2016   Pre-diabetes    RBBB (right bundle branch block)    Rheumatic fever    age 33 or 75, in hospital for a month, out of school for a yr   Seasonal allergies    Squamous cell carcinoma    Testicular hypofunction    Past Surgical History:  Procedure Laterality Date   EYE SURGERY     right eye  cataract surgery with lens implant   NASAL SEPTUM SURGERY     NOSE SURGERY     REVERSE SHOULDER ARTHROPLASTY Right 01/11/2020   Procedure: REVERSE SHOULDER ARTHROPLASTY;  Surgeon: Dozier Soulier, MD;  Location: [facility];  Service: Orthopedics;  Laterality: Right;   SHOULDER ARTHROSCOPY WITH SUBACROMIAL DECOMPRESSION Right 08/07/2019   Procedure: SHOULDER ARTHROSCOPY WITH SUBACROMIAL DECOMPRESSION;  Surgeon:  Dozier Soulier, MD;  Location: [facility];  Service: Orthopedics;  Laterality: Right;   SKIN CANCER EXCISION     basal and squamous cell    TONSILLECTOMY     VASECTOMY     WISDOM TOOTH EXTRACTION     Social History   Socioeconomic History   Marital status: Widowed    Spouse name: Not on file   Number of children: 3   Years of education: Not on file   Highest education level: Not on file  Occupational History    Comment: Retired  Tobacco Use   Smoking status: Former    Current packs/day: 0.00    Types: Cigarettes    Quit date: 12/14/1969    Years since quitting: 54.8   Smokeless tobacco: Never  Vaping Use   Vaping status: Never Used  Substance and Sexual Activity   Alcohol  use: Yes    Alcohol /week: 2.0 standard drinks of alcohol     Types: 2 Cans of beer per week    Comment: social   Drug use: Never   Sexual activity: Not on file  Other Topics Concern   Not on file  Social History Narrative   Not on file   Social Drivers of Health   Financial Resource Strain: Not on file  Food Insecurity: Not on file  Transportation Needs: Not on file  Physical Activity: Not on file  Stress: Not on file  Social Connections: Unknown (03/17/2022)   Received from [facility]   Social Network    Social Network: Not on file   Allergies  Allergen Reactions   Bee Venom Anaphylaxis   Moxifloxacin Shortness Of Breath   Family History  Problem Relation Age of Onset   Stroke Mother    Arthritis Mother    Cancer Mother        unknown    Dementia Mother    Arthritis Father    Mesothelioma Father    Arthritis Sister    Arthritis Sister    Bipolar disorder Sister      Current Outpatient Medications (Cardiovascular):    amLODipine  (NORVASC ) 5 MG tablet, Take 1 tablet (5 mg total) by mouth daily.   ezetimibe  (ZETIA ) 10 MG tablet, Take 1 tablet (10 mg total) by mouth daily.   hydrochlorothiazide  (HYDRODIURIL ) 25 MG tablet, Take 1 tablet (25 mg total) by mouth in  the morning.   losartan  (COZAAR ) 100 MG tablet, Take 1 tablet (100 mg total) by mouth at bedtime.   rosuvastatin  (CRESTOR ) 10 MG tablet, TAKE 1 TABLET BY MOUTH EVERY DAY   rosuvastatin  (CRESTOR ) 20 MG tablet, Take 1 tablet (20 mg total) by mouth daily.   sildenafil  (REVATIO ) 20 MG tablet, Take 1-5 tablets by mouth as needed 30 minutes-4 hours prior to sexual activity as directed.   tadalafil  (CIALIS ) 5 MG tablet, Take 1 tablet (5 mg total) by mouth daily.   tadalafil  (CIALIS ) 5 MG tablet, Take 1 tablet (5 mg total) by mouth daily.   tadalafil  (CIALIS ) 5 MG tablet, Take 1 tablet (5 mg total) by mouth daily.  Current Outpatient Medications (Respiratory):    umeclidinium bromide  (INCRUSE ELLIPTA ) 62.5 MCG/ACT AEPB, Inhale 1 puff into the lungs daily (rinse mouth with water  after use)  Current Outpatient Medications (Analgesics):    aspirin  EC 81 MG tablet, Take 1 tablet (81 mg total) by mouth daily. Swallow whole.   Current Outpatient Medications (Other):    amoxicillin  (AMOXIL ) 500 MG capsule, Take 4 capsules by mouth 1 hour prior to dental treatment   amoxicillin -clavulanate (AUGMENTIN ) 875-125 MG tablet, Take 1 tablet by mouth 2 (two) times daily  with food for 10 days   Barberry-Oreg Grape-Goldenseal (BERBERINE COMPLEX PO), Take by mouth.   cefdinir  (OMNICEF ) 300 MG capsule, Take 1 capsule (300 mg total) by mouth 2 (two) times daily with food.   cephALEXin  (KEFLEX ) 500 MG capsule, Take 4 capsules (2,000 mg total) by mouth one hour prior to surgery   CHELATED MAGNESIUM PO, Take 2 tablets by mouth in the morning and at bedtime.    Cholecalciferol (VITAMIN D3) 1.25 MG (50000 UT) CAPS, Take by mouth.   ciprofloxacin -dexamethasone  (CIPRODEX ) OTIC suspension, Place 4 drops into the right ear 2 (two) times daily for 3 days   Coenzyme Q10 (CO Q 10) 100 MG CAPS, Take 100 mg by mouth in the morning and at bedtime.    COVID-19 mRNA vaccine (SPIKEVAX ) syringe, Administer Covid vaccine x once  Intramuscular as directed   diazepam  (VALIUM ) 5 MG tablet, One tabet by mouth, 2 hours before procedure.   escitalopram  (LEXAPRO ) 10 MG tablet, Take 1 tablet (10 mg total) by mouth daily.   Investigational omega-3-fatty acid/placebo capsule S0927*, Take 4 capsules by mouth 2 (two) times daily. Take with food.   LORazepam  (ATIVAN ) 0.5 MG tablet, Take 1 tablet (0.5 mg total) by  mouth 2 (two) times daily as needed for anxiety.   Multiple Vitamin (MULTI-VITAMINS) TABS, Take 3 tablets by mouth in the morning and at bedtime.    nirmatrelvir /ritonavir  EUA (PAXLOVID ) TABS, Take 3 tablets by mouth 2 (two) times daily.   ofloxacin  (OCUFLOX ) 0.3 % ophthalmic solution, Apply 4 drops in right ear twice daily for 5 days.   prednisoLONE  acetate (PRED FORTE ) 1 % ophthalmic suspension, Place 1 drop into the right eye 3 (three) times daily.   prednisoLONE  acetate (PRED FORTE ) 1 % ophthalmic suspension, Place 1 drop into the right eye 3 times daily.   Probiotic Product (PROBIOTIC PO), Take 1 capsule by mouth daily.   Saw Palmetto, Serenoa repens, (SAW PALMETTO PO), Take 2 capsules by mouth in the morning and at bedtime. * These medications belong to multiple therapeutic classes and are listed under each applicable group.   Reviewed prior external information including notes and imaging from  primary care provider As well as notes that were available from care everywhere and other healthcare systems.  Past medical history, social, surgical and family history all reviewed in electronic medical record.  No pertanent information unless stated regarding to the chief complaint.   Review of Systems:  No headache, visual changes, nausea, vomiting, diarrhea, constipation, dizziness, abdominal pain, skin rash, fevers, chills, night sweats, weight loss, swollen lymph nodes, body aches, joint swelling, chest pain, shortness of breath, mood changes. POSITIVE muscle aches  Objective  There were no vitals taken for this  visit.   General: No apparent distress alert and oriented x3 mood and affect normal, dressed appropriately.  HEENT: Pupils equal, extraocular movements intact  Respiratory: Patient's speak in full sentences and does not appear short of breath  Cardiovascular: No lower extremity edema, non tender, no erythema      Impression and Recommendations:

## 2024-10-10 NOTE — Therapy (Signed)
 OUTPATIENT PHYSICAL THERAPY TREATMENT   Patient Name: Cory Barnett MRN: [id number] DOB:1946/06/23, 78 y.o., male Today's Date: 10/10/2024     END OF SESSION:  PT End of Session - 10/10/24 1027     Visit Number 22    Date for Recertification  10/24/24    Authorization Type Medicare B- KX modifier now    Progress Note Due on Visit 20    PT Start Time 0934    PT Stop Time 1019    PT Time Calculation (min) 45 min    Activity Tolerance Patient tolerated treatment well    Behavior During Therapy [facility] for tasks assessed/performed                                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   IFG (impaired fasting glucose) 01/2016   Pre-diabetes    RBBB (right bundle branch block)    Rheumatic fever    age 31 or 89, in hospital for a month, out of school for a yr   Seasonal allergies    Squamous cell carcinoma    Testicular hypofunction    Past Surgical History:  Procedure Laterality Date   EYE SURGERY     right eye  cataract surgery with lens implant   NASAL SEPTUM SURGERY     NOSE SURGERY     REVERSE SHOULDER ARTHROPLASTY Right 01/11/2020   Procedure: REVERSE SHOULDER ARTHROPLASTY;  Surgeon: Dozier Soulier, MD;  Location: [facility];  Service: Orthopedics;  Laterality: Right;   SHOULDER ARTHROSCOPY WITH SUBACROMIAL DECOMPRESSION Right 08/07/2019   Procedure: SHOULDER ARTHROSCOPY WITH SUBACROMIAL DECOMPRESSION;  Surgeon: Dozier Soulier, MD;  Location: [facility];  Service: Orthopedics;  Laterality: Right;   SKIN CANCER EXCISION     basal and squamous cell    TONSILLECTOMY     VASECTOMY     WISDOM TOOTH EXTRACTION     Patient Active Problem List   Diagnosis Date Noted   Bursitis of both hips 02/23/2023    Degenerative arthritis of knee, bilateral 11/08/2022   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   Left knee pain 08/05/2020   Piriformis syndrome of left side 08/18/2019   Rotator cuff tear, right 05/29/2019   Closed fracture of distal clavicle 05/15/2019   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    PCP: Shayne Anes, MD   REFERRING PROVIDER: Claudene Hussar, MD  REFERRING DIAG:  Diagnosis  M17.0 (ICD-10-CM) - Primary osteoarthritis of both knees  M54.16 (ICD-10-CM) - Lumbar radiculitis    THERAPY DIAG:  Chronic pain of left knee  Muscle weakness (generalized)  Other abnormalities of gait and mobility  Chronic pain of right knee  Other low back pain  Rationale for Evaluation and Treatment: Rehabilitation  ONSET DATE: chronic with recent falls   SUBJECTIVE:   SUBJECTIVE STATEMENT: I [place] [employer] on Monday and Friday.  I did pilates yesterday. My knee pain comes and goes.  It is worse on steps and squatting to feed my cat.   PERTINENT HISTORY: Anxiety, HTN, reverse shoulder replacement (2021), basal cell carcinoma  PAIN: 10/10/24 Are you having pain? Yes: NPRS scale: knee 5/10 (up  to 8/10 with squatting) Pain location: Lt>Rt knee , low back  Pain description: knees (sharp), low back (dull ache) Aggravating factors: going up steps, standing long periods, squatting to feed cat  Relieving factors: sitting down, not climbing steps   PRECAUTIONS: Fall  WEIGHT BEARING RESTRICTIONS: No  FALLS:  Has patient fallen in last 6 months? No  LIVING ENVIRONMENT: Lives with: lives alone, recent widower Lives in: House/apartment Stairs: Yes: External: 3 steps; on right going up Has following equipment at home: None  OCCUPATION: retired  PLOF: Independent, Vocation/Vocational requirements: desk work, walking-retired but small amount of work, and Leisure: walking for exercises Pilates every week in a small group, works out with trainer 2x/wk. 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 No acute osseous abnormalities  Right knee:  Degenerative changes. No acute osseous abnormalities.   US  on 12/10/23:  Limited muscular skeletal ultrasound was performed and interpreted by CLAUDENE HUSSAR, M  Left knee does have significant decrease in the hypoechoic changes noted.  Does have narrowing of the patellofemoral joint bilaterally but worsening on the right side. Impression: Worsening effusion of the right knee compared to left  07/12/24: X-ray of hip/pelvis:  IMPRESSION: 1. No evidence of fracture or dislocation. 2. 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 3. 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  COGNITION: Overall cognitive status: Within functional limits for tasks assessed     SENSATION: WFL  MUSCLE LENGTH: Hamstring flexibility limited by 25% bilaterally  POSTURE: rounded shoulders and forward head  PALPATION: Diffuse palpable tenderness in bil quads, and with patellar mobs/compression   LOWER EXTREMITY ROM:  Knee extension limited   LOWER EXTREMITY MMT:  MMT Right eval Left eval  Hip flexion 4 4  Hip extension 4+ 4+  Hip abduction 4 4  Hip adduction    Hip internal rotation    Hip external rotation    Knee flexion 4+ 4+  Knee extension 4+ 4+  Ankle dorsiflexion 5 5  Ankle plantarflexion    Ankle inversion    Ankle eversion     (Blank rows = not tested)   FUNCTIONAL TESTS:  06/19/24: 5x sit to stand: 20.44 seconds   Modified Oswestry: 14/50=28%   07/13/24: 5x sit to stand: 16.76 seconds  08/15/24:  5x sit to stand: 13.95  GAIT: Distance walked: 100 Assistive device utilized: None Level of assistance: Complete Independence Comments: bil knee flexion, wide base of support Steps: negotiating with alternating pattern and use of hands for support, pain with uncontrolled  descent with descending   08/29/24: Short Physical Performance Battery:    Balance:       Side by side stance:    points (1)-1  Semi -tandem:     points (1)-1  Tandem:      points (2)-0    Gait Speed: (35m=9.84 feet) done 2x take the best time:    points                                           Unable=0                                           >  6.52 sec=1 pt                                           4.66-6.52 sec=2 pt                                           3.62-4.65 sec=3 pt                                          <3.62 sec= 4 pts- 3.12 seconds     Repeated Chair Stands:      points   (timer stopped when straight on 5th stand)                                              If used arms=0 pts                                              > 60 sec=0 pts                                              16.70 or more=1 pt                                               13.70-16.69=2 pts                                               11.20-13.69=3 pts-13.17 seconds                                                11.9 sec or less=4 pts Total score=    9/12  points <10/12 predictive of 1 or more mobility limitations and increased risk of mobility disability 6 or less/12 associated with a higher fall rate                                                 The Patient-Specific Functional Scale  Initial:  I am going to ask you to identify up to 3 important activities that you are unable to do or are having difficulty with as a result of this problem.  Today are there any activities that you are unable to do or having difficulty with because of this?  (Patient shown scale and patient rated  each activity)  Follow up: When you first came in you had difficulty performing these activities.  Today do you still have difficulty?  Patient-Specific activity scoring scheme (Point to one number):  0 1 2 3 4 5 6 7 8 9  10 Unable                                                                                                           Able to perform To perform                                                                                                    activity at the same Activity         Level as before                                                                                                                       Injury or problem  Activity  Negotiating obstacles and steps at football stadium                 Initial: 6/10 (08/29/24)                  follow up: 7/10 (09/26/24)  2. Squatting to feed cat                                                                        Initial:    6/10 (08/29/24)                   follow up: 6.5/10 (09/26/24)                                              TODAY'S TREATMENT:  DATE:   10/10/24 NuStep: level 4 (blue machine)x 6 minutes- PT present to discuss Seated hamstring stretch: 3x20 seconds  Lunge on to [employer]: using poles x15 each  Leg Press: seat 9, 90#  x20, unilateral  50# 3x10 Hip Machine (abduction & flexion, extension 45#) 2x 15 bilateral  Resisted walking 20# forward and reverse x10 each-supervision by PT for safety Farmer's carry: 15# KB 2 laps around both gyms each hand position Modified dead lift: 20# 2x10  2024/10/05 NuStep: level 4 (blue machine)x 6 minutes- PT present to discuss Seated hamstring stretch: 3x20 seconds  Lunge on to [employer]: using poles x15 each  Leg Press: seat 9, 90#  x20, unilateral  50# 3x10 Hip Machine (abduction & flexion, extension 45#) 2x 15 bilateral  Resisted walking 20# forward and reverse x10 each-supervision by PT for safety Farmer's carry: 15# KB 2 laps around both gyms each hand position Modified dead lift: 20# 2x10   09-28-2024 NuStep: level 4 (blue machine)x 6 minutes- PT present to discuss Seated hamstring stretch: 3x20 seconds  Lunge on to [employer]: using poles x15 each   Leg Press: seat 9, 90#  x20, unilateral  50# 3x10 Hip Machine (abduction & flexion, extension 45#) 2x 15 bilateral  Resisted walking 20# forward and reverse x10 each-supervision by PT for safety Farmer's carry: 15# KB 2 laps around both gyms each hand position Modified dead lift: 20# 2x10   PATIENT EDUCATION:  Education details: Access Code: [email or web address], use walking stick for community distances and football stadiums Person educated: Patient Education method: Programmer, Multimedia, Facilities Manager, and Handouts Education comprehension: verbalized understanding and returned demonstration  HOME EXERCISE PROGRAM: Access Code: [email or web address] URL: [email or web address] Date: 07/13/2024 Prepared by: Burnard  Exercises - Seated Hamstring Stretch  - 3 x daily - 7 x weekly - 1 sets - 3 reps - 20 hold - Supine Lower Trunk Rotation  - 3 x daily - 7 x weekly - 1 sets - 3 reps - 20 hold - Hooklying Single Knee to Chest Stretch  - 3 x daily - 7 x weekly - 1 sets - 3 reps - 20 hold - Seated Figure 4 Piriformis Stretch  - 3 x daily - 7 x weekly - 1 sets - 3 reps - 20 hold - Heel Raises with Counter Support  - 1 x daily - 7 x weekly - 3 sets - 10 reps - Clamshell  - 1 x daily - 7 x weekly - 2 sets - 10 reps - Standing Hip Abduction with Counter Support  - 1 x daily - 7 x weekly - 1-2 sets - 10 reps - Seated Long Arc Quad  - 2 x daily - 7 x weekly - 2 sets - 10 reps - 5 hold - Seated Sciatic Nerve Glide With Cervical Motion  - 2-3 x daily - 7 x weekly - 1 sets - 10 reps  ASSESSMENT:  CLINICAL IMPRESSION: Pt reports intermittent Lt knee pain. Pt with increased pain when squatting to feed his cat and PT instructed pt on how to modify this activity to reduce squatting.  Pt continues to be active at the gym and with pilates. PT monitored Patient will benefit from skilled PT to address the below impairments and improve overall function.  OBJECTIVE IMPAIRMENTS: Abnormal gait, decreased activity tolerance,  decreased balance, decreased endurance, difficulty walking, decreased strength, decreased safety awareness, increased muscle spasms, impaired flexibility, improper body mechanics, and pain.   ACTIVITY LIMITATIONS: carrying, lifting, standing, squatting, stairs, transfers, and locomotion  level  PARTICIPATION LIMITATIONS: cleaning, laundry, community activity, and yard work  PERSONAL FACTORS: Age, Time since onset of injury/illness/exacerbation, and 1 comorbidity: OA are also affecting patient's functional outcome.   REHAB POTENTIAL: Good  CLINICAL DECISION MAKING: Evolving/moderate complexity  EVALUATION COMPLEXITY: Moderate   GOALS: Goals reviewed with patient? Yes  SHORT TERM GOALS: Target date: 07/17/2024     Be independent in initial HEP Baseline: consistent with HEP Goal status: MET  2.  Perform 5x sit to stand in < 12 seconds without UE  support to reduce falls risk Baseline: 13.95 (08/15/24) Goal status: REVISED  3.  Improve bil knee strength and stability to ascend 8 with use of 1 rail for safety in negotiating steps at high school football games Baseline: working on this and using walking stick (07/04/24) Goal status: IN PROGRESS  4.  Stand > or = to 30 min without limitation due to LBP.   Baseline: no limitations (07/25/24)  Goal Status: MET  LONG TERM GOALS: Target date: 10/24/2024   Be independent in advanced HEP Baseline:  Goal status: IN PROGRESS  2.  Improve score on short performance physical battery to > or = to 11/12 Baseline: 9/12  Goal status: INITIAL  3.  Improve Modified Oswestry to < or = to 18% disability  Baseline: 28% disability  Goal status: IN PROGRESS  4. Improve PSFS for squatting to feed cat to > or = to 7/10 due to improved LE strength Baseline: 6/10 Goal status: INITIAL  5.  Improve PSFS for negotiating stadium steps to > or = to 7/10 Baseline: 6/10 (08/29/24) Goal status: INITIAL PLAN:  PT FREQUENCY: 1-2x/week  PT DURATION:   8 weeks  PLANNED INTERVENTIONS: Therapeutic exercises, Therapeutic activity, Neuromuscular re-education, Balance training, Gait training, Patient/Family education, Self Care, Joint mobilization, Stair training, Vestibular training, Aquatic Therapy, Dry Needling, Cryotherapy, Moist heat, Manual therapy, and Re-evaluation  PLAN FOR NEXT SESSION: Quad and glute strength, balance tasks, hip and knee flexibility, functional mobility, balance.  1-2 more sessions probable.       Burnard Joy, PT 10/10/24 10:28 AM   [facility] [address], [address] [place], [place] [address] Phone # [phone] Fax [phone]

## 2024-10-11 ENCOUNTER — Encounter: Payer: Self-pay | Admitting: Family Medicine

## 2024-10-11 ENCOUNTER — Ambulatory Visit (INDEPENDENT_AMBULATORY_CARE_PROVIDER_SITE_OTHER): Admitting: Family Medicine

## 2024-10-11 VITALS — BP 110/68 | HR 79 | Ht 74.0 in | Wt 221.0 lb

## 2024-10-11 DIAGNOSIS — M170 Bilateral primary osteoarthritis of knee: Secondary | ICD-10-CM

## 2024-10-11 DIAGNOSIS — M216X2 Other acquired deformities of left foot: Secondary | ICD-10-CM | POA: Diagnosis not present

## 2024-10-11 NOTE — Assessment & Plan Note (Addendum)
 Breakdown of the transverse arch noted.  Discussed icing regimen.  Discussed exercises and patient work with event organiser.  Discussed which activities to do and which ones to avoid.  Increase activity slowly.  Follow-up again in 6 to 12 weeks.  Worsening pain will consider the possibility of neuroma.  May need injection

## 2024-10-11 NOTE — Assessment & Plan Note (Signed)
 Has arthritic changes and has been 3 months but patient would like to hold off longer to see how patient does.  We discussed with patient about icing regimen, home exercises, which activities to do and which ones to avoid.  Increase activity slowly.  Follow-up again in 6 to 12 weeks.

## 2024-10-11 NOTE — Patient Instructions (Signed)
 Do prescribed exercises at least 3x a week Spen[employer] Support Orthotics Add new shoes to shopping list Relace shoes See you again in 2 months

## 2024-10-19 ENCOUNTER — Ambulatory Visit

## 2024-10-19 DIAGNOSIS — G8929 Other chronic pain: Secondary | ICD-10-CM

## 2024-10-19 DIAGNOSIS — M6281 Muscle weakness (generalized): Secondary | ICD-10-CM

## 2024-10-19 DIAGNOSIS — M25562 Pain in left knee: Secondary | ICD-10-CM | POA: Diagnosis not present

## 2024-10-19 DIAGNOSIS — R2689 Other abnormalities of gait and mobility: Secondary | ICD-10-CM

## 2024-10-19 DIAGNOSIS — M5459 Other low back pain: Secondary | ICD-10-CM

## 2024-10-19 NOTE — Therapy (Signed)
 OUTPATIENT PHYSICAL THERAPY TREATMENT   Patient Name: Cory Barnett MRN: [id number] DOB:Mar 19, 1946, 78 y.o., male Today's Date: 10/19/2024     END OF SESSION:  PT End of Session - 10/19/24 1147     Visit Number 23    Date for Recertification  10/24/24    Authorization Type Medicare B- KX modifier now    Progress Note Due on Visit 20    PT Start Time 1104    PT Stop Time 1145    PT Time Calculation (min) 41 min    Activity Tolerance Patient tolerated treatment well    Behavior During Therapy WFL for tasks assessed/performed                                 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   IFG (impaired fasting glucose) 01/2016   Pre-diabetes    RBBB (right bundle branch block)    Rheumatic fever    age 8 or 53, in hospital for a month, out of school for a yr   Seasonal allergies    Squamous cell carcinoma    Testicular hypofunction    Past Surgical History:  Procedure Laterality Date   EYE SURGERY     right eye  cataract surgery with lens implant   NASAL SEPTUM SURGERY     NOSE SURGERY     REVERSE SHOULDER ARTHROPLASTY Right 01/11/2020   Procedure: REVERSE SHOULDER ARTHROPLASTY;  Surgeon: Dozier Soulier, MD;  Location: [facility];  Service: Orthopedics;  Laterality: Right;   SHOULDER ARTHROSCOPY WITH SUBACROMIAL DECOMPRESSION Right 08/07/2019   Procedure: SHOULDER ARTHROSCOPY WITH SUBACROMIAL DECOMPRESSION;  Surgeon: Dozier Soulier, MD;  Location: [facility];  Service: Orthopedics;  Laterality: Right;   SKIN CANCER EXCISION     basal and squamous cell    TONSILLECTOMY     VASECTOMY     WISDOM TOOTH EXTRACTION     Patient Active Problem List   Diagnosis Date Noted   Loss of transverse plantar arch of left  foot 10/11/2024   Bursitis of both hips 02/23/2023   Degenerative arthritis of knee, bilateral 11/08/2022   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   Left knee pain 08/05/2020   Piriformis syndrome of left side 08/18/2019   Rotator cuff tear, right 05/29/2019   Closed fracture of distal clavicle 05/15/2019   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    PCP: Shayne Anes, MD   REFERRING PROVIDER: Claudene Hussar, MD  REFERRING DIAG:  Diagnosis  M17.0 (ICD-10-CM) - Primary osteoarthritis of both knees  M54.16 (ICD-10-CM) - Lumbar radiculitis    THERAPY DIAG:  Chronic pain of left knee  Muscle weakness (generalized)  Other abnormalities of gait and mobility  Chronic pain of right knee  Other low back pain  Rationale for Evaluation and Treatment: Rehabilitation  ONSET DATE: chronic with recent falls   SUBJECTIVE:   SUBJECTIVE STATEMENT: I saw Dr Claudene last week.  I am going to get an orthotic to address my Lt foot pain.  I can wait another visit to get another injection.  PERTINENT HISTORY: Anxiety, HTN, reverse shoulder replacement (2021), basal cell carcinoma  PAIN: 10/19/24  Are you having pain? Yes: NPRS scale: knee 5/10  Pain location: Lt>Rt knee , low back  Pain description: knees (sharp), low back (dull ache) Aggravating factors: going up steps, standing long periods, squatting to feed cat  Relieving factors: sitting down, not climbing steps   PRECAUTIONS: Fall  WEIGHT BEARING RESTRICTIONS: No  FALLS:  Has patient fallen in last 6 months? No  LIVING ENVIRONMENT: Lives with: lives alone, recent widower Lives in: House/apartment Stairs: Yes: External: 3 steps; on right going up Has following equipment at home: None  OCCUPATION: retired  PLOF: Independent, Vocation/Vocational requirements: desk work, walking-retired but small amount of work, and Leisure: walking for exercises Pilates every week in a small group, works out with trainer 2x/wk. Goes to HS  football games to watch grandsons  PATIENT GOALS: reduce pain, improve balance   NEXT MD VISIT: 6 weeks   OBJECTIVE:   DIAGNOSTIC FINDINGS: X-ray: Left knee Degenerative changes. No acute osseous abnormalities  Right knee:  Degenerative changes. No acute osseous abnormalities.   US  on 12/10/23:  Limited muscular skeletal ultrasound was performed and interpreted by CLAUDENE HUSSAR, M  Left knee does have significant decrease in the hypoechoic changes noted.  Does have narrowing of the patellofemoral joint bilaterally but worsening on the right side. Impression: Worsening effusion of the right knee compared to left  07/12/24: X-ray of hip/pelvis:  IMPRESSION: 1. No evidence of fracture or dislocation. 2. 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 3. 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  COGNITION: Overall cognitive status: Within functional limits for tasks assessed     SENSATION: WFL  MUSCLE LENGTH: Hamstring flexibility limited by 25% bilaterally  POSTURE: rounded shoulders and forward head  PALPATION: Diffuse palpable tenderness in bil quads, and with patellar mobs/compression   LOWER EXTREMITY ROM:  Knee extension limited   LOWER EXTREMITY MMT:  MMT Right eval Left eval  Hip flexion 4 4  Hip extension 4+ 4+  Hip abduction 4 4  Hip adduction    Hip internal rotation    Hip external rotation    Knee flexion 4+ 4+  Knee extension 4+ 4+  Ankle dorsiflexion 5 5  Ankle plantarflexion    Ankle inversion    Ankle eversion     (Blank rows = not tested)   FUNCTIONAL TESTS:  06/19/24: 5x sit to stand: 20.44 seconds   Modified Oswestry: 14/50=28%   07/13/24: 5x sit to stand: 16.76 seconds  08/15/24:  5x sit to stand: 13.95  GAIT: Distance walked: 100 Assistive device utilized: None Level of assistance: Complete Independence Comments: bil knee flexion, wide base of support Steps: negotiating with alternating pattern and use of hands for support, pain  with uncontrolled descent with descending   08/29/24: Short Physical Performance Battery:    Balance:       Side by side stance:    points (1)-1  Semi -tandem:     points (1)-1  Tandem:      points (2)-0    Gait Speed: (27m=9.84 feet) done 2x take the best time:    points                                           Unable=0                                           >  6.52 sec=1 pt                                           4.66-6.52 sec=2 pt                                           3.62-4.65 sec=3 pt                                          <3.62 sec= 4 pts- 3.12 seconds     Repeated Chair Stands:      points   (timer stopped when straight on 5th stand)                                              If used arms=0 pts                                              > 60 sec=0 pts                                              16.70 or more=1 pt                                               13.70-16.69=2 pts                                               11.20-13.69=3 pts-13.17 seconds                                                11.9 sec or less=4 pts Total score=    9/12  points <10/12 predictive of 1 or more mobility limitations and increased risk of mobility disability 6 or less/12 associated with a higher fall rate                                                 The Patient-Specific Functional Scale  Initial:  I am going to ask you to identify up to 3 important activities that you are unable to do or are having difficulty with as a result of this problem.  Today are there any activities that you are unable to do or having difficulty with because of this?  (Patient shown scale and patient rated  each activity)  Follow up: When you first came in you had difficulty performing these activities.  Today do you still have difficulty?  Patient-Specific activity scoring scheme (Point to one number):  0 1 2 3 4 5 6 7 8 9  10 Unable                                                                                                           Able to perform To perform                                                                                                    activity at the same Activity         Level as before                                                                                                                       Injury or problem  Activity  Negotiating obstacles and steps at football stadium                 Initial: 6/10 (08/29/24)                  follow up: 8/10 (10/19/24)  2. Squatting to feed cat                                                                        Initial:    6/10 (08/29/24)                   follow up: 6.5/10 (09/26/24)                                              TODAY'S TREATMENT:  DATE:   10/19/24 NuStep: level 5 (green machine)x 7 minutes- PT present to discuss Standing hamstring stretch: 3x20 seconds -using power plate Lunge on to [employer]: using poles x15 each  Leg Press: seat 9, 90#  x20, unilateral  50# 3x10 Hip Machine (abduction & flexion, extension 45#) 2x 15 bilateral  Resisted walking 20# forward and reverse x10 each-supervision by PT for safety Farmer's carry: 15# KB 2 laps around both gyms each hand position Modified dead lift: 20# 2x10  08-Nov-2024 NuStep: level 4 (blue machine)x 6 minutes- PT present to discuss Seated hamstring stretch: 3x20 seconds  Lunge on to [employer]: using poles x15 each  Leg Press: seat 9, 90#  x20, unilateral  50# 3x10 Hip Machine (abduction & flexion, extension 45#) 2x 15 bilateral  Resisted walking 20# forward and reverse x10 each-supervision by PT for safety Farmer's carry: 15# KB 2 laps around both gyms each hand position Modified dead lift: 20# 2x10  Nov 01, 2024 NuStep: level 4 (blue machine)x 6 minutes- PT present to discuss Seated hamstring stretch: 3x20 seconds  Lunge on  to [employer]: using poles x15 each  Leg Press: seat 9, 90#  x20, unilateral  50# 3x10 Hip Machine (abduction & flexion, extension 45#) 2x 15 bilateral  Resisted walking 20# forward and reverse x10 each-supervision by PT for safety Farmer's carry: 15# KB 2 laps around both gyms each hand position Modified dead lift: 20# 2x10     PATIENT EDUCATION:  Education details: Access Code: [email or web address], use walking stick for community distances and football stadiums Person educated: Patient Education method: Programmer, Multimedia, Facilities Manager, and Handouts Education comprehension: verbalized understanding and returned demonstration  HOME EXERCISE PROGRAM: Access Code: [email or web address] URL: [email or web address] Date: 07/13/2024 Prepared by: Burnard  Exercises - Seated Hamstring Stretch  - 3 x daily - 7 x weekly - 1 sets - 3 reps - 20 hold - Supine Lower Trunk Rotation  - 3 x daily - 7 x weekly - 1 sets - 3 reps - 20 hold - Hooklying Single Knee to Chest Stretch  - 3 x daily - 7 x weekly - 1 sets - 3 reps - 20 hold - Seated Figure 4 Piriformis Stretch  - 3 x daily - 7 x weekly - 1 sets - 3 reps - 20 hold - Heel Raises with Counter Support  - 1 x daily - 7 x weekly - 3 sets - 10 reps - Clamshell  - 1 x daily - 7 x weekly - 2 sets - 10 reps - Standing Hip Abduction with Counter Support  - 1 x daily - 7 x weekly - 1-2 sets - 10 reps - Seated Long Arc Quad  - 2 x daily - 7 x weekly - 2 sets - 10 reps - 5 hold - Seated Sciatic Nerve Glide With Cervical Motion  - 2-3 x daily - 7 x weekly - 1 sets - 10 reps  ASSESSMENT:  CLINICAL IMPRESSION: Pt is getting stronger and is able to negotiate steps with less effort. He continues to exercise at the gym and walks regularly.  He is having Lt foot pain and PT suggested that he use a ball to mobilize the bottom of the foot.   PT monitored Patient will benefit from skilled PT to address the below impairments and improve overall function. Patient will benefit from  skilled PT to address the below impairments and improve overall function.   OBJECTIVE IMPAIRMENTS: Abnormal gait, decreased activity tolerance, decreased balance, decreased endurance, difficulty walking, decreased strength,  decreased safety awareness, increased muscle spasms, impaired flexibility, improper body mechanics, and pain.   ACTIVITY LIMITATIONS: carrying, lifting, standing, squatting, stairs, transfers, and locomotion level  PARTICIPATION LIMITATIONS: cleaning, laundry, community activity, and yard work  PERSONAL FACTORS: Age, Time since onset of injury/illness/exacerbation, and 1 comorbidity: OA are also affecting patient's functional outcome.   REHAB POTENTIAL: Good  CLINICAL DECISION MAKING: Evolving/moderate complexity  EVALUATION COMPLEXITY: Moderate   GOALS: Goals reviewed with patient? Yes  SHORT TERM GOALS: Target date: 07/17/2024     Be independent in initial HEP Baseline: consistent with HEP Goal status: MET  2.  Perform 5x sit to stand in < 12 seconds without UE  support to reduce falls risk Baseline: 13.95 (08/15/24) Goal status: REVISED  3.  Improve bil knee strength and stability to ascend 8 with use of 1 rail for safety in negotiating steps at high school football games Baseline: able to do this with 1 rail (10/19/24) Goal status: MET  4.  Stand > or = to 30 min without limitation due to LBP.   Baseline: no limitations (07/25/24)  Goal Status: MET  LONG TERM GOALS: Target date: 10/24/2024   Be independent in advanced HEP Baseline:  Goal status: IN PROGRESS  2.  Improve score on short performance physical battery to > or = to 11/12 Baseline: 9/12  Goal status: INITIAL  3.  Improve Modified Oswestry to < or = to 18% disability  Baseline: 28% disability  Goal status: IN PROGRESS  4. Improve PSFS for squatting to feed cat to > or = to 7/10 due to improved LE strength Baseline: 6/10 Goal status: INITIAL  5.  Improve PSFS for negotiating  stadium steps to > or = to 7/10 Baseline: 8/10 (10/19/24) Goal status: MET PLAN:  PT FREQUENCY: 1-2x/week  PT DURATION:  8 weeks  PLANNED INTERVENTIONS: Therapeutic exercises, Therapeutic activity, Neuromuscular re-education, Balance training, Gait training, Patient/Family education, Self Care, Joint mobilization, Stair training, Vestibular training, Aquatic Therapy, Dry Needling, Cryotherapy, Moist heat, Manual therapy, and Re-evaluation  PLAN FOR NEXT SESSION: Quad and glute strength, balance tasks, hip and knee flexibility, functional mobility, balance.  1 more session probable.   Give Modified Oswestry, 5x sit to stand    Burnard Joy, PT 10/19/2024 11:48 AM   [facility] [address] [place], [place] [address] Phone # [phone] Fax [phone]

## 2024-10-24 ENCOUNTER — Ambulatory Visit

## 2024-10-24 DIAGNOSIS — M6281 Muscle weakness (generalized): Secondary | ICD-10-CM

## 2024-10-24 DIAGNOSIS — G8929 Other chronic pain: Secondary | ICD-10-CM

## 2024-10-24 DIAGNOSIS — R2689 Other abnormalities of gait and mobility: Secondary | ICD-10-CM

## 2024-10-24 DIAGNOSIS — M25562 Pain in left knee: Secondary | ICD-10-CM | POA: Diagnosis not present

## 2024-10-24 NOTE — Therapy (Signed)
 " OUTPATIENT PHYSICAL THERAPY TREATMENT   Patient Name: Cory Barnett MRN: [id number] DOB:Oct 10, 1946, 78 y.o., male Today's Date: 10/24/2024     END OF SESSION:  PT End of Session - 10/24/24 1013     Visit Number 24    Authorization Type Medicare B- KX modifier now    PT Start Time 0935    PT Stop Time 1014    PT Time Calculation (min) 39 min    Activity Tolerance Patient tolerated treatment well    Behavior During Therapy WFL for tasks assessed/performed                                  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   IFG (impaired fasting glucose) 01/2016   Pre-diabetes    RBBB (right bundle branch block)    Rheumatic fever    age 14 or 75, in hospital for a month, out of school for a yr   Seasonal allergies    Squamous cell carcinoma    Testicular hypofunction    Past Surgical History:  Procedure Laterality Date   EYE SURGERY     right eye  cataract surgery with lens implant   NASAL SEPTUM SURGERY     NOSE SURGERY     REVERSE SHOULDER ARTHROPLASTY Right 01/11/2020   Procedure: REVERSE SHOULDER ARTHROPLASTY;  Surgeon: Dozier Soulier, MD;  Location: [facility];  Service: Orthopedics;  Laterality: Right;   SHOULDER ARTHROSCOPY WITH SUBACROMIAL DECOMPRESSION Right 08/07/2019   Procedure: SHOULDER ARTHROSCOPY WITH SUBACROMIAL DECOMPRESSION;  Surgeon: Dozier Soulier, MD;  Location: [facility];  Service: Orthopedics;  Laterality: Right;   SKIN CANCER EXCISION     basal and squamous cell    TONSILLECTOMY     VASECTOMY     WISDOM TOOTH EXTRACTION     Patient Active Problem List   Diagnosis Date Noted   Loss of transverse plantar arch of left foot 10/11/2024   Bursitis of both hips 02/23/2023   Degenerative  arthritis of knee, bilateral 11/08/2022   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   Left knee pain 08/05/2020   Piriformis syndrome of left side 08/18/2019   Rotator cuff tear, right 05/29/2019   Closed fracture of distal clavicle 05/15/2019   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    PCP: Shayne Anes, MD   REFERRING PROVIDER: Claudene Hussar, MD  REFERRING DIAG:  Diagnosis  M17.0 (ICD-10-CM) - Primary osteoarthritis of both knees  M54.16 (ICD-10-CM) - Lumbar radiculitis    THERAPY DIAG:  Chronic pain of left knee  Muscle weakness (generalized)  Other abnormalities of gait and mobility  Chronic pain of right knee  Rationale for Evaluation and Treatment: Rehabilitation  ONSET DATE: chronic with recent falls   SUBJECTIVE:   SUBJECTIVE STATEMENT: My knee pain goes up to 7/10 with squatting and negotiating steps.  No pain with walking.    PERTINENT HISTORY: Anxiety, HTN, reverse shoulder replacement (2021), basal cell carcinoma  PAIN: 10/24/24 Are you having pain? Yes: NPRS scale: knee 0-7/10  Pain location: Lt>Rt knee , low back  Pain description: knees (sharp), low back (dull ache) Aggravating factors: going up steps,  standing long periods, squatting to feed cat  Relieving factors: sitting down, not climbing steps   PRECAUTIONS: Fall  WEIGHT BEARING RESTRICTIONS: No  FALLS:  Has patient fallen in last 6 months? No  LIVING ENVIRONMENT: Lives with: lives alone, recent widower Lives in: House/apartment Stairs: Yes: External: 3 steps; on right going up Has following equipment at home: None  OCCUPATION: retired  PLOF: Independent, Vocation/Vocational requirements: desk work, walking-retired but small amount of work, and Leisure: walking for exercises Pilates every week in a small group, works out with trainer 2x/wk. 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 No acute osseous abnormalities  Right knee:  Degenerative changes. No acute osseous abnormalities.   US  on 12/10/23:  Limited muscular skeletal ultrasound was performed and interpreted by CLAUDENE HUSSAR, M  Left knee does have significant decrease in the hypoechoic changes noted.  Does have narrowing of the patellofemoral joint bilaterally but worsening on the right side. Impression: Worsening effusion of the right knee compared to left  07/12/24: X-ray of hip/pelvis:  IMPRESSION: 1. No evidence of fracture or dislocation. 2. 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 3. 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  COGNITION: Overall cognitive status: Within functional limits for tasks assessed     SENSATION: WFL  MUSCLE LENGTH: Hamstring flexibility limited by 25% bilaterally  POSTURE: rounded shoulders and forward head  PALPATION: Diffuse palpable tenderness in bil quads, and with patellar mobs/compression   LOWER EXTREMITY ROM:  Knee extension limited   LOWER EXTREMITY MMT:  MMT Right eval Left eval  Hip flexion 4 4  Hip extension 4+ 4+  Hip abduction 4 4  Hip adduction    Hip internal rotation    Hip external rotation    Knee flexion 4+ 4+  Knee extension 4+ 4+  Ankle dorsiflexion 5 5  Ankle plantarflexion    Ankle inversion    Ankle eversion     (Blank rows = not tested)   FUNCTIONAL TESTS:  06/19/24: 5x sit to stand: 20.44 seconds   Modified Oswestry: 14/50=28%   07/13/24: 5x sit to stand: 16.76 seconds  08/15/24:  5x sit to stand: 13.95   10/24/24: 5x sit to stand: 16.5 seconds- pain in Lt knee Modified Oswestry: 4% disability   GAIT: Distance walked: 100 Assistive device utilized: None Level of assistance: Complete Independence Comments: bil knee flexion, wide base of support Steps: negotiating with alternating pattern and use of hands for support, pain with uncontrolled descent with descending    08/29/24: Short Physical Performance Battery:    Balance:       Side by side stance:    points (1)-1  Semi -tandem:     points (1)-1  Tandem:      points (2)-0    Gait Speed: (1m=9.84 feet) done 2x take the best time:    points                                           Unable=0                                           >  6.52 sec=1 pt                                           4.66-6.52 sec=2 pt                                           3.62-4.65 sec=3 pt                                          <3.62 sec= 4 pts- 3.12 seconds     Repeated Chair Stands:      points   (timer stopped when straight on 5th stand)                                              If used arms=0 pts                                              > 60 sec=0 pts                                              16.70 or more=1 pt                                               13.70-16.69=2 pts                                               11.20-13.69=3 pts-13.17 seconds -15.60 seconds (10/24/24)                                               11.9 sec or less=4 pts Total score=    9/12  points 10/24/24- 9/12- due to Lt knee pain  <10/12 predictive of 1 or more mobility limitations and increased risk of mobility disability 6 or less/12 associated with a higher fall rate                                                 The Patient-Specific Functional Scale  Initial:  I am going to ask you to identify up to 3 important activities that you are unable to do or are having difficulty with as a result of this problem.  Today are there any activities that you are unable to do or having difficulty with  because of this?  (Patient shown scale and patient rated each activity)  Follow up: When you first came in you had difficulty performing these activities.  Today do you still have difficulty?  Patient-Specific activity scoring scheme (Point to one number):  0 1 2 3 4 5 6 7 8 9  10 Unable                                                                                                           Able to perform To perform                                                                                                    activity at the same Activity         Level as before                                                                                                                       Injury or problem  Activity  Negotiating obstacles and steps at football stadium                 Initial: 6/10 (08/29/24)                  follow up: 8/10 (10/19/24)  2. Squatting to feed cat                                                                        Initial:    6/10 (08/29/24)                   follow up: 6.5/10 (09/26/24)  TODAY'S TREATMENT:                                                                                                                              DATE:   10/24/24 NuStep: level 5 (green machine)x 7 minutes- PT present to discuss Standing hamstring stretch: 3x20 seconds -using power plate Lunge on to [employer]: using poles x15 each  Hip Machine (abduction & flexion, extension 45#) 2x 15 bilateral  Resisted walking 20# forward and reverse x10 each-supervision by PT for safety Modified dead lift: 20# 2x10   06-Nov-2024 NuStep: level 5 (green machine)x 7 minutes- PT present to discuss Standing hamstring stretch: 3x20 seconds -using power plate Lunge on to [employer]: using poles x15 each  Leg Press: seat 9, 90#  x20, unilateral  50# 3x10 Hip Machine (abduction & flexion, extension 45#) 2x 15 bilateral  Resisted walking 20# forward and reverse x10 each-supervision by PT for safety Farmer's carry: 15# KB 2 laps around both gyms each hand position Modified dead lift: 20# 2x10  10/28/2024 NuStep: level 4 (blue machine)x 6 minutes- PT present to discuss Seated hamstring stretch: 3x20 seconds  Lunge on to [employer]: using poles x15 each  Leg Press: seat 9, 90#  x20,  unilateral  50# 3x10 Hip Machine (abduction & flexion, extension 45#) 2x 15 bilateral  Resisted walking 20# forward and reverse x10 each-supervision by PT for safety Farmer's carry: 15# KB 2 laps around both gyms each hand position Modified dead lift: 20# 2x10   PATIENT EDUCATION:  Education details: Access Code: [email or web address], use walking stick for community distances and football stadiums Person educated: Patient Education method: Programmer, Multimedia, Facilities Manager, and Handouts Education comprehension: verbalized understanding and returned demonstration  HOME EXERCISE PROGRAM: Access Code: [email or web address] URL: [email or web address] Date: 07/13/2024 Prepared by: Burnard  Exercises - Seated Hamstring Stretch  - 3 x daily - 7 x weekly - 1 sets - 3 reps - 20 hold - Supine Lower Trunk Rotation  - 3 x daily - 7 x weekly - 1 sets - 3 reps - 20 hold - Hooklying Single Knee to Chest Stretch  - 3 x daily - 7 x weekly - 1 sets - 3 reps - 20 hold - Seated Figure 4 Piriformis Stretch  - 3 x daily - 7 x weekly - 1 sets - 3 reps - 20 hold - Heel Raises with Counter Support  - 1 x daily - 7 x weekly - 3 sets - 10 reps - Clamshell  - 1 x daily - 7 x weekly - 2 sets - 10 reps - Standing Hip Abduction with Counter Support  - 1 x daily - 7 x weekly - 1-2 sets - 10 reps - Seated Long Arc Quad  - 2 x daily - 7 x weekly - 2 sets - 10 reps - 5 hold - Seated Sciatic Nerve Glide With Cervical Motion  - 2-3 x daily - 7 x weekly - 1 sets -  10 reps  ASSESSMENT:  CLINICAL IMPRESSION: Pt with increased Lt knee pain over the past 2 weeks.  He is due for injection soon and this should help.  PT educated pt to work to protect the Lt knee joints with squatting and stair climbing. He is most limited by Lt knee with balance testing today so no change in score.  He will continue with regular walking, pilates and exercise at the gym to maintain his current level of function.  Modified Oswestry is improved to 4% disability.      OBJECTIVE IMPAIRMENTS: Abnormal gait, decreased activity tolerance, decreased balance, decreased endurance, difficulty walking, decreased strength, decreased safety awareness, increased muscle spasms, impaired flexibility, improper body mechanics, and pain.   ACTIVITY LIMITATIONS: carrying, lifting, standing, squatting, stairs, transfers, and locomotion level  PARTICIPATION LIMITATIONS: cleaning, laundry, community activity, and yard work  PERSONAL FACTORS: Age, Time since onset of injury/illness/exacerbation, and 1 comorbidity: OA are also affecting patient's functional outcome.   REHAB POTENTIAL: Good  CLINICAL DECISION MAKING: Evolving/moderate complexity  EVALUATION COMPLEXITY: Moderate   GOALS: Goals reviewed with patient? Yes  SHORT TERM GOALS: Target date: 07/17/2024     Be independent in initial HEP Baseline: consistent with HEP Goal status: MET  2.  Perform 5x sit to stand in < 12 seconds without UE  support to reduce falls risk Baseline: 13.95 (08/15/24) Goal status: REVISED  3.  Improve bil knee strength and stability to ascend 8 with use of 1 rail for safety in negotiating steps at high school football games Baseline: able to do this with 1 rail (10/19/24) Goal status: MET  4.  Stand > or = to 30 min without limitation due to LBP.   Baseline: no limitations (07/25/24)  Goal Status: MET  LONG TERM GOALS: Target date: 10/24/2024   Be independent in advanced HEP Baseline:  Goal status: MET  2.  Improve score on short performance physical battery to > or = to 11/12 Baseline: 9/12 (10/24/24) Goal status: NOT MET  3.  Improve Modified Oswestry to < or = to 18% disability  Baseline: 4% disability  Goal status: MET  4. Improve PSFS for squatting to feed cat to > or = to 7/10 due to improved LE strength Baseline: 6.5/10 (10/24/24) Goal status: INITIAL  5.  Improve PSFS for negotiating stadium steps to > or = to 7/10 Baseline: 8/10 (10/19/24) Goal  status: MET PLAN: PHYSICAL THERAPY DISCHARGE SUMMARY  Visits from Start of Care: 24  Current functional level related to goals / functional outcomes: See above.  Pt will D/C to HEP today.  He continues at the gym and does pilates 1x/wk   Remaining deficits: LT knee pain that limits steps and squatting.  He will modify activity and get injection soon    Education / Equipment: HEP   Patient agrees to discharge. Patient goals were partially met. Patient is being discharged due to being pleased with the current functional level.     Burnard Joy, PT 10/24/2024 10:25 AM   [facility] [address], [address] [place], [place] [address] Phone # [phone] Fax [phone]  "

## 2024-11-16 ENCOUNTER — Other Ambulatory Visit (HOSPITAL_COMMUNITY): Payer: Self-pay

## 2024-11-17 ENCOUNTER — Other Ambulatory Visit (HOSPITAL_COMMUNITY): Payer: Self-pay

## 2024-11-17 ENCOUNTER — Other Ambulatory Visit: Payer: Self-pay

## 2024-11-30 ENCOUNTER — Other Ambulatory Visit (HOSPITAL_COMMUNITY): Payer: Self-pay

## 2024-12-02 ENCOUNTER — Other Ambulatory Visit (HOSPITAL_COMMUNITY): Payer: Self-pay

## 2024-12-04 ENCOUNTER — Other Ambulatory Visit (HOSPITAL_COMMUNITY): Payer: Self-pay

## 2024-12-19 ENCOUNTER — Ambulatory Visit: Admitting: Family Medicine
